# Patient Record
Sex: Male | Born: 1945 | Race: White | Hispanic: No | Smoking: Former smoker
Health system: Southern US, Community
[De-identification: ages and names within clinical notes are randomized; demographics above are authoritative.]

## PROBLEM LIST (undated history)

## (undated) DIAGNOSIS — K5792 Diverticulitis of intestine, part unspecified, without perforation or abscess without bleeding: Secondary | ICD-10-CM

## (undated) DIAGNOSIS — E559 Vitamin D deficiency, unspecified: Secondary | ICD-10-CM

## (undated) DIAGNOSIS — I4891 Unspecified atrial fibrillation: Secondary | ICD-10-CM

## (undated) DIAGNOSIS — I10 Essential (primary) hypertension: Secondary | ICD-10-CM

## (undated) DIAGNOSIS — I519 Heart disease, unspecified: Secondary | ICD-10-CM

## (undated) DIAGNOSIS — I350 Nonrheumatic aortic (valve) stenosis: Secondary | ICD-10-CM

## (undated) DIAGNOSIS — I480 Paroxysmal atrial fibrillation: Secondary | ICD-10-CM

## (undated) DIAGNOSIS — I5022 Chronic systolic (congestive) heart failure: Secondary | ICD-10-CM

## (undated) DIAGNOSIS — R945 Abnormal results of liver function studies: Secondary | ICD-10-CM

## (undated) DIAGNOSIS — E291 Testicular hypofunction: Secondary | ICD-10-CM

## (undated) DIAGNOSIS — F4320 Adjustment disorder, unspecified: Secondary | ICD-10-CM

## (undated) DIAGNOSIS — Z95810 Presence of automatic (implantable) cardiac defibrillator: Secondary | ICD-10-CM

## (undated) DIAGNOSIS — R7989 Other specified abnormal findings of blood chemistry: Secondary | ICD-10-CM

## (undated) DIAGNOSIS — I472 Ventricular tachycardia, unspecified: Secondary | ICD-10-CM

## (undated) DIAGNOSIS — I509 Heart failure, unspecified: Secondary | ICD-10-CM

## (undated) DIAGNOSIS — E871 Hypo-osmolality and hyponatremia: Secondary | ICD-10-CM

## (undated) DIAGNOSIS — I340 Nonrheumatic mitral (valve) insufficiency: Secondary | ICD-10-CM

## (undated) DIAGNOSIS — E119 Type 2 diabetes mellitus without complications: Secondary | ICD-10-CM

## (undated) DIAGNOSIS — K635 Polyp of colon: Secondary | ICD-10-CM

## (undated) DIAGNOSIS — I071 Rheumatic tricuspid insufficiency: Secondary | ICD-10-CM

## (undated) DIAGNOSIS — G4730 Sleep apnea, unspecified: Secondary | ICD-10-CM

## (undated) DIAGNOSIS — R32 Unspecified urinary incontinence: Secondary | ICD-10-CM

## (undated) HISTORY — DX: Paroxysmal atrial fibrillation: I48.0

## (undated) HISTORY — DX: Sleep apnea, unspecified: G47.30

## (undated) HISTORY — DX: Nonrheumatic mitral (valve) insufficiency: I34.0

## (undated) HISTORY — DX: Abnormal results of liver function studies: R94.5

## (undated) HISTORY — DX: Diverticulitis of intestine, part unspecified, without perforation or abscess without bleeding: K57.92

## (undated) HISTORY — DX: Chronic systolic (congestive) heart failure: I50.22

## (undated) HISTORY — DX: Presence of automatic (implantable) cardiac defibrillator: Z95.810

## (undated) HISTORY — DX: Other specified abnormal findings of blood chemistry: R79.89

## (undated) HISTORY — DX: Essential (primary) hypertension: I10

## (undated) HISTORY — DX: Ventricular tachycardia, unspecified: I47.20

## (undated) HISTORY — DX: Rheumatic tricuspid insufficiency: I07.1

## (undated) HISTORY — DX: Nonrheumatic aortic (valve) stenosis: I35.0

## (undated) HISTORY — DX: Polyp of colon: K63.5

## (undated) HISTORY — DX: Hypo-osmolality and hyponatremia: E87.1

## (undated) HISTORY — DX: Testicular hypofunction: E29.1

## (undated) HISTORY — DX: Vitamin D deficiency, unspecified: E55.9

## (undated) HISTORY — DX: Unspecified atrial fibrillation: I48.91

## (undated) HISTORY — DX: Type 2 diabetes mellitus without complications: E11.9

## (undated) HISTORY — DX: Adjustment disorder, unspecified: F43.20

## (undated) HISTORY — DX: Heart failure, unspecified: I50.9

## (undated) HISTORY — PX: CHOLECYSTECTOMY: SHX55

## (undated) HISTORY — DX: Unspecified urinary incontinence: R32

## (undated) HISTORY — DX: Heart disease, unspecified: I51.9

## (undated) HISTORY — DX: Ventricular tachycardia: I47.2

## (undated) HISTORY — PX: TONSILLECTOMY: SUR1361

---

## 1997-12-19 ENCOUNTER — Emergency Department (HOSPITAL_COMMUNITY): Admission: EM | Admit: 1997-12-19 | Discharge: 1997-12-19 | Payer: Self-pay

## 2000-06-28 ENCOUNTER — Encounter: Payer: Self-pay | Admitting: Family Medicine

## 2000-06-28 ENCOUNTER — Encounter: Admission: RE | Admit: 2000-06-28 | Discharge: 2000-06-28 | Payer: Self-pay | Admitting: Family Medicine

## 2020-03-30 ENCOUNTER — Other Ambulatory Visit: Payer: Self-pay

## 2020-03-30 ENCOUNTER — Ambulatory Visit (INDEPENDENT_AMBULATORY_CARE_PROVIDER_SITE_OTHER): Payer: Medicare Other | Admitting: Adult Health

## 2020-03-30 ENCOUNTER — Encounter: Payer: Self-pay | Admitting: Adult Health

## 2020-03-30 VITALS — BP 100/72 | HR 72 | Temp 98.1°F | Ht 68.5 in | Wt 239.4 lb

## 2020-03-30 DIAGNOSIS — E1169 Type 2 diabetes mellitus with other specified complication: Secondary | ICD-10-CM

## 2020-03-30 DIAGNOSIS — Z7689 Persons encountering health services in other specified circumstances: Secondary | ICD-10-CM

## 2020-03-30 DIAGNOSIS — I10 Essential (primary) hypertension: Secondary | ICD-10-CM | POA: Diagnosis not present

## 2020-03-30 DIAGNOSIS — G4730 Sleep apnea, unspecified: Secondary | ICD-10-CM | POA: Insufficient documentation

## 2020-03-30 DIAGNOSIS — E119 Type 2 diabetes mellitus without complications: Secondary | ICD-10-CM

## 2020-03-30 DIAGNOSIS — I4891 Unspecified atrial fibrillation: Secondary | ICD-10-CM

## 2020-03-30 DIAGNOSIS — G4733 Obstructive sleep apnea (adult) (pediatric): Secondary | ICD-10-CM | POA: Diagnosis not present

## 2020-03-30 DIAGNOSIS — I509 Heart failure, unspecified: Secondary | ICD-10-CM

## 2020-03-30 DIAGNOSIS — F4320 Adjustment disorder, unspecified: Secondary | ICD-10-CM | POA: Insufficient documentation

## 2020-03-30 DIAGNOSIS — K5792 Diverticulitis of intestine, part unspecified, without perforation or abscess without bleeding: Secondary | ICD-10-CM | POA: Insufficient documentation

## 2020-03-30 DIAGNOSIS — F4321 Adjustment disorder with depressed mood: Secondary | ICD-10-CM

## 2020-03-30 LAB — CBC WITH DIFFERENTIAL/PLATELET
Eosinophils Absolute: 234 cells/uL (ref 15–500)
HCT: 44.3 % (ref 38.5–50.0)
MCHC: 33.9 g/dL (ref 32.0–36.0)
MPV: 9.3 fL (ref 7.5–12.5)
Neutro Abs: 6624 cells/uL (ref 1500–7800)
Platelets: 323 10*3/uL (ref 140–400)
Total Lymphocyte: 14.4 %
WBC: 9 10*3/uL (ref 3.8–10.8)

## 2020-03-30 NOTE — Progress Notes (Signed)
Patient presents to clinic today to establish care. He is a pleasant 74 year old male who  has a past medical history of Adjustment disorder, Atrial fibrillation ([place]), Benign colon polyp, CHF (congestive heart failure) ([place]), Diabetes mellitus without complication ([place]), Diverticulitis, Essential hypertension, Heart disease, Hypogonadism male, Sleep apnea, Urine incontinence, and Vitamin D deficiency.  He recently moved back to [place] from [place] where he lived for a number of years   Acute Concerns: Establish Care  Chronic Issues: DM -is currently managed with Metformin 500 mg twice daily and Glaxambi 25 mg daily.  He reports that he had an A1c done approximately 2 weeks ago and this result was 6.6.  Monitor his blood sugars twice a day with readings between 120 and 180.  He denies episodes of hypoglycemia.  He was seen by endocrinology in [facility]  OSA - uses Bipap nightly.  He feels as though this works well for him.  Denies fatigue.  He was seen by pulmonary in [place] and would like to be established with them in [place]. He also uses Symbicort for an unknown breathing issue.  He does feel as though since starting Symbicort that his breathing has improved.  Atrial Fibrillation -seen By cardiology in [place], [place].  Cardiac stress test and cath in 2001.  He is currently prescribed Eliquis 5 mg twice daily, Dofetilde 125 mcg and Coreg 6.25 mg for rate control. Does report that he was seen prior to moving and had an echocardiogram done which showed "my heart is about 20%".  I do not have these notes.  Last echo in care everywhere appears to be from 2017 which showed mild mitral regurg, mild thickening of the aortic valve, mild to moderate aortic stenosis with an EF of 30 to 35%. He would like to follow up with Cardiology in [facility]   CHF -currently prescribed spironolactone 25 mg daily, furosemide 80 mg twice daily.  He is also on a potassium supplement of 20 mEq twice a  day.  CAD/Hyperlipidemia -takes Lipitor 20 mg daily. Last lipid panel  Adjustment disorder.- Reports well-controlled on Lexapro 10 mg daily.   Health Maintenance: Dental -- Routine Care Vision -- Routine Care Immunizations -- UTD Colonoscopy -- 2019 - for GI bleed. History of Polyps. Due in 2022 Diet: Tries to eat healthy  Exercise: Ties to exercise multiple times a week.    Past Medical History:  Diagnosis Date   Adjustment disorder    Atrial fibrillation (HCC)    Benign colon polyp    CHF (congestive heart failure) ([place])    Diabetes mellitus without complication ([place])    Diverticulitis    Essential hypertension    Heart disease    Hypogonadism male    Sleep apnea    Urine incontinence    Vitamin D deficiency     Past Surgical History:  Procedure Laterality Date   CHOLECYSTECTOMY     TONSILLECTOMY      Current Outpatient Medications on File Prior to Visit  Medication Sig Dispense Refill   Arginine (L-ARGININE-500) 500 MG CAPS Take by mouth.     Ascorbic Acid (VITAMIN C) 1000 MG tablet Take 1,000 mg by mouth daily.     atorvastatin (LIPITOR) 20 MG tablet Take 20 mg by mouth daily.     B Complex Vitamins (B COMPLEX PO) Take by mouth daily.     BIOTIN PO Take 10,000 Units by mouth daily.     budesonide-formoterol (SYMBICORT) 160-4.5 MCG/ACT inhaler INL 2 PFS PO  BID IN THE MORNING AND IN THE EVE     CALCIUM PO Take by mouth daily.     carvedilol (COREG) 6.25 MG tablet Take 6.25 mg by mouth 2 (two) times daily.     cholecalciferol (VITAMIN D3) 25 MCG (1000 UNIT) tablet Take 1,000 Units by mouth daily.     Coenzyme Q10 (COQ10) 100 MG CAPS Take by mouth daily.     dofetilide (TIKOSYN) 125 MCG capsule Take 125 mcg by mouth every 12 (twelve) hours.     ELIQUIS 5 MG TABS tablet Take 5 mg by mouth 2 (two) times daily.     escitalopram (LEXAPRO) 10 MG tablet Take 10 mg by mouth daily.     furosemide (LASIX) 40 MG tablet Take 80 mg by mouth 2  (two) times daily.     MAGNESIUM PO Take 400 mg by mouth daily.     metFORMIN (GLUCOPHAGE) 500 MG tablet Take by mouth.     Multiple Vitamins-Minerals (ZINC PO) Take by mouth daily.     Omega-3 Fatty Acids (FISH OIL PO) Take 1,000 mg by mouth.     OVER THE COUNTER MEDICATION Chorium     OVER THE COUNTER MEDICATION L-caritine 56m     Potassium Chloride ER 20 MEQ TBCR Take 2 tablets by mouth daily.     spironolactone (ALDACTONE) 25 MG tablet Take 25 mg by mouth daily.     vitamin E (VITAMIN E) 180 MG (400 UNITS) capsule Take 400 Units by mouth daily.     Zn-Pyg Afri-Nettle-Saw Palmet (SAW PALMETTO COMPLEX PO) Take 540 mg by mouth daily.     No current facility-administered medications on file prior to visit.    No Known Allergies  Family History  Problem Relation Age of Onset   Heart attack Mother    High Cholesterol Mother    Asthma Father    Kidney disease Sister    Stroke Maternal Grandfather    Heart attack Paternal Grandfather     Social History   Socioeconomic History   Marital status: Widowed    Spouse name: Not on file   Number of children: Not on file   Years of education: Not on file   Highest education level: Not on file  Occupational History   Not on file  Tobacco Use   Smoking status: Former Smoker   Smokeless tobacco: Never Used  Substance and Sexual Activity   Alcohol use: Not Currently   Drug use: Not Currently   Sexual activity: Not on file  Other Topics Concern   Not on file  Social History Narrative   Not on file   Social Determinants of Health   Financial Resource Strain:    Difficulty of Paying Living Expenses: Not on file  Food Insecurity:    Worried About RCorydonin the Last Year: Not on file   Ran Out of Food in the Last Year: Not on file  Transportation Needs:    Lack of Transportation (Medical): Not on file   Lack of Transportation (Non-Medical): Not on file  Physical Activity:    Days  of Exercise per Week: Not on file   Minutes of Exercise per Session: Not on file  Stress:    Feeling of Stress : Not on file  Social Connections:    Frequency of Communication with Friends and Family: Not on file   Frequency of Social Gatherings with Friends and Family: Not on file   Attends Religious Services: Not on file  Active Member of Clubs or Organizations: Not on file   Attends Archivist Meetings: Not on file   Marital Status: Not on file  Intimate Partner Violence:    Fear of Current or Ex-Partner: Not on file   Emotionally Abused: Not on file   Physically Abused: Not on file   Sexually Abused: Not on file    Review of Systems  Constitutional: Negative.   HENT: Positive for hearing loss and tinnitus.   Eyes: Negative.   Respiratory: Positive for shortness of breath.   Cardiovascular: Negative.   Gastrointestinal: Negative.   Genitourinary: Negative.   Musculoskeletal: Negative.   Neurological: Negative.   Endo/Heme/Allergies: Negative.   Psychiatric/Behavioral: Negative.   All other systems reviewed and are negative.   BP 100/72 (BP Location: Left Arm, Patient Position: Sitting, Cuff Size: Large)    Pulse 72    Temp 98.1 F (36.7 C) (Oral)    Ht 5' 8.5" (1.74 m)    Wt 239 lb 6.4 oz (108.6 kg)    SpO2 94%    BMI 35.87 kg/m   Physical Exam Vitals and nursing note reviewed.  Constitutional:      Appearance: Normal appearance. He is obese.  HENT:     Ears:     Comments: Extremely hard of hearing   Cardiovascular:     Rate and Rhythm: Normal rate. Rhythm irregularly irregular.     Pulses: Normal pulses.     Heart sounds: Normal heart sounds.  Pulmonary:     Effort: Pulmonary effort is normal.     Breath sounds: Normal breath sounds.  Musculoskeletal:        General: Normal range of motion.  Skin:    General: Skin is warm and dry.     Capillary Refill: Capillary refill takes less than 2 seconds.  Neurological:     General: No focal  deficit present.     Mental Status: He is alert and oriented to person, place, and time.  Psychiatric:        Mood and Affect: Mood normal.        Behavior: Behavior normal.        Thought Content: Thought content normal.        Judgment: Judgment normal.      Assessment/Plan: 1. Encounter to establish care - Will request notes and follow up with patient about CPE  - Follow up as needed - Encouraged weight loss through diet and exercise   2. Type 2 diabetes mellitus with other specified complication, without long-term current use of insulin (HCC) -Continue with current medications.  We will recheck A1c today.  Advised to follow-up in 3 months - CBC with Differential/Platelet; Future - CMP with eGFR([facility]); Future - Hemoglobin A1c; Future - Hemoglobin A1c - CMP with eGFR([facility]) - CBC with Differential/Platelet  3. Obstructive sleep apnea syndrome  - Ambulatory referral to Pulmonology  4. Essential hypertension -No change in blood pressure medications at this time   5. Chronic congestive heart failure, unspecified heart failure type ([place]) Continue with current medication therapy.  Will refer to cardiology. - Ambulatory referral to Cardiology  6. Adjustment disorder with depressed mood - Continue with Lexapr   7. Atrial fibrillation, unspecified type [facility])  - Ambulatory referral to Cardiology  Dorothyann Peng, NP

## 2020-03-30 NOTE — Patient Instructions (Addendum)
It was great seeing you today   I am going to refer you to Cardiology and Pulmonary, they will call you to schedule your exams   Please follow up with me in 3 months for diabetic check

## 2020-03-31 LAB — COMPLETE METABOLIC PANEL WITH GFR
AG Ratio: 1.5 (calc) (ref 1.0–2.5)
ALT: 23 U/L (ref 9–46)
AST: 26 U/L (ref 10–35)
Albumin: 4.7 g/dL (ref 3.6–5.1)
Alkaline phosphatase (APISO): 54 U/L (ref 35–144)
BUN: 21 mg/dL (ref 7–25)
CO2: 29 mmol/L (ref 20–32)
Calcium: 9.7 mg/dL (ref 8.6–10.3)
Chloride: 98 mmol/L (ref 98–110)
Creat: 1.08 mg/dL (ref 0.70–1.18)
GFR, Est African American: 78 mL/min/{1.73_m2} (ref >=60)
GFR, Est Non African American: 67 mL/min/{1.73_m2} (ref >=60)
Globulin: 3.2 g/dL (calc) (ref 1.9–3.7)
Glucose, Bld: 145 mg/dL — ABNORMAL HIGH (ref 65–99)
Potassium: 3.9 mmol/L (ref 3.5–5.3)
Sodium: 139 mmol/L (ref 135–146)
Total Bilirubin: 1.4 mg/dL — ABNORMAL HIGH (ref 0.2–1.2)
Total Protein: 7.9 g/dL (ref 6.1–8.1)

## 2020-03-31 LAB — CBC WITH DIFFERENTIAL/PLATELET
Absolute Monocytes: 792 cells/uL (ref 200–950)
Basophils Absolute: 54 cells/uL (ref 0–200)
Basophils Relative: 0.6 %
Eosinophils Relative: 2.6 %
Hemoglobin: 15 g/dL (ref 13.2–17.1)
Lymphs Abs: 1296 cells/uL (ref 850–3900)
MCH: 31 pg (ref 27.0–33.0)
MCV: 91.5 fL (ref 80.0–100.0)
Monocytes Relative: 8.8 %
Neutrophils Relative %: 73.6 %
RBC: 4.84 10*6/uL (ref 4.20–5.80)
RDW: 14.7 % (ref 11.0–15.0)

## 2020-03-31 LAB — HEMOGLOBIN A1C
Hgb A1c MFr Bld: 6.3 % of total Hgb — ABNORMAL HIGH (ref <5.7)
Mean Plasma Glucose: 134 (calc)
eAG (mmol/L): 7.4 (calc)

## 2020-04-01 ENCOUNTER — Encounter: Payer: Self-pay | Admitting: Internal Medicine

## 2020-04-01 ENCOUNTER — Ambulatory Visit (INDEPENDENT_AMBULATORY_CARE_PROVIDER_SITE_OTHER): Payer: Medicare Other | Admitting: Internal Medicine

## 2020-04-01 ENCOUNTER — Other Ambulatory Visit: Payer: Self-pay

## 2020-04-01 VITALS — BP 108/62 | HR 70 | Ht 68.5 in | Wt 240.4 lb

## 2020-04-01 DIAGNOSIS — I5020 Unspecified systolic (congestive) heart failure: Secondary | ICD-10-CM | POA: Diagnosis not present

## 2020-04-01 DIAGNOSIS — E119 Type 2 diabetes mellitus without complications: Secondary | ICD-10-CM

## 2020-04-01 DIAGNOSIS — I350 Nonrheumatic aortic (valve) stenosis: Secondary | ICD-10-CM

## 2020-04-01 DIAGNOSIS — I340 Nonrheumatic mitral (valve) insufficiency: Secondary | ICD-10-CM

## 2020-04-01 DIAGNOSIS — I480 Paroxysmal atrial fibrillation: Secondary | ICD-10-CM | POA: Insufficient documentation

## 2020-04-01 DIAGNOSIS — I10 Essential (primary) hypertension: Secondary | ICD-10-CM

## 2020-04-01 DIAGNOSIS — I4891 Unspecified atrial fibrillation: Secondary | ICD-10-CM

## 2020-04-01 NOTE — Patient Instructions (Signed)
Medication Instructions:  Your physician recommends that you continue on your current medications as directed. Please refer to the Current Medication list given to you today.  *If you need a refill on your cardiac medications before your next appointment, please call your pharmacy*   Lab Work: Lab work to be done today--BMP, BNP, Magnesium If you have labs (blood work) drawn today and your tests are completely normal, you will receive your results only by: Marland Kitchen MyChart Message (if you have MyChart) OR . A paper copy in the mail If you have any lab test that is abnormal or we need to change your treatment, we will call you to review the results.   Testing/Procedures: Your physician has requested that you have an echocardiogram. Echocardiography is a painless test that uses sound waves to create images of your heart. It provides your doctor with information about the size and shape of your heart and how well your heart's chambers and valves are working. This procedure takes approximately one hour. There are no restrictions for this procedure.  You have been referred to Electrophysiologist in our office    Follow-Up: At [facility], you and your health needs are our priority.  As part of our continuing mission to provide you with exceptional heart care, we have created designated Provider Care Teams.  These Care Teams include your primary Cardiologist (physician) and Advanced Practice Providers (APPs -  Physician Assistants and Nurse Practitioners) who all work together to provide you with the care you need, when you need it.  We recommend signing up for the patient portal called "MyChart".  Sign up information is provided on this After Visit Summary.  MyChart is used to connect with patients for Virtual Visits (Telemedicine).  Patients are able to view lab/test results, encounter notes, upcoming appointments, etc.  Non-urgent messages can be sent to your provider as well.   To learn more about  what you can do with MyChart, go to [email or web address].    Your next appointment:   2 month(s)  The format for your next appointment:   In Person  Provider:   You may see Christell Constant, MD or one of the following Advanced Practice Providers on your designated Care Team:    Ronie Spies, PA-C  Jacolyn Reedy, PA-C    Other Instructions

## 2020-04-01 NOTE — Progress Notes (Signed)
Cardiology Office Note:    Date:  04/01/2020   ID:  Chad Wilkerson, DOB 08-Sep-1945, MRN [id number]  PCP:  [name], NP  [facility] Cardiologist:  No primary care provider on file.  [facility] Electrophysiologist:  None   Referring MD: [name], NP   CC: Heart failure and atrial fibrillation   History of Present Illness:    Chad Wilkerson is a 74 y.o. male with a hx of Diabetes with HTN, HFrEF 25-30% with Medtronic ICD, mild to moderate AS, Mild MR, Atrial fibrillation on Dofetilide, eliquis, and coreg;  OSA on BiPAP who presents for establishing care.  Patient moved back home to [facility].  Patient notes he has AF but is asymptomatic, no palpitations. No bleeding problems on eliquis(mild bruising only). No problems with AAD.  No fatigue; feels well.  No chest pain.  No shortness of breath.  Still has some dyspnea or exertion.  (14 stairs).   Able to lie flat without shortness of breath (though wears CPAP).  Notes some bendopnea.  Weight is down.  Past Medical History:  Diagnosis Date  . Adjustment disorder   . Atrial fibrillation (HCC)   . Benign colon polyp   . CHF (congestive heart failure) (HCC)   . Diabetes mellitus without complication (HCC)   . Diverticulitis   . Essential hypertension   . Heart disease   . Hypogonadism male   . Sleep apnea   . Urine incontinence   . Vitamin D deficiency     Past Surgical History:  Procedure Laterality Date  . CHOLECYSTECTOMY    . TONSILLECTOMY      Current Medications: Current Meds  Medication Sig  . Arginine (L-ARGININE-500) 500 MG CAPS Take by mouth.  . Ascorbic Acid (VITAMIN C) 1000 MG tablet Take 1,000 mg by mouth daily.  Marland Kitchen atorvastatin (LIPITOR) 20 MG tablet Take 20 mg by mouth daily.  . B Complex Vitamins (B COMPLEX PO) Take by mouth daily.  Marland Kitchen BIOTIN PO Take 10,000 Units by mouth daily.  . budesonide-formoterol (SYMBICORT) 160-4.5 MCG/ACT inhaler INL 2 PFS PO BID IN THE MORNING AND IN THE  EVE  . CALCIUM PO Take by mouth daily.  . carvedilol (COREG) 6.25 MG tablet Take 6.25 mg by mouth 2 (two) times daily.  . cholecalciferol (VITAMIN D3) 25 MCG (1000 UNIT) tablet Take 1,000 Units by mouth daily.  . Coenzyme Q10 (COQ10) 100 MG CAPS Take by mouth daily.  Marland Kitchen dofetilide (TIKOSYN) 125 MCG capsule Take 125 mcg by mouth every 12 (twelve) hours.  Marland Kitchen ELIQUIS 5 MG TABS tablet Take 5 mg by mouth 2 (two) times daily.  Marland Kitchen escitalopram (LEXAPRO) 10 MG tablet Take 10 mg by mouth daily.  . furosemide (LASIX) 40 MG tablet Take 80 mg by mouth 2 (two) times daily.  Marland Kitchen MAGNESIUM PO Take 400 mg by mouth daily.  . metFORMIN (GLUCOPHAGE) 500 MG tablet Take by mouth.  . Multiple Vitamins-Minerals (ZINC PO) Take by mouth daily.  . Omega-3 Fatty Acids (FISH OIL PO) Take 1,000 mg by mouth.  Marland Kitchen OVER THE COUNTER MEDICATION Chorium  . OVER THE COUNTER MEDICATION L-caritine 500mg   . Potassium Chloride ER 20 MEQ TBCR Take 2 tablets by mouth daily.  spironolactone (ALDACTONE) 25 MG tablet Take 25 mg by mouth daily.  . vitamin E (VITAMIN E) 180 MG (400 UNITS) capsule Take 400 Units by mouth daily.  Marland Kitchen Zn-Pyg Afri-Nettle-Saw Palmet (SAW PALMETTO COMPLEX PO) Take 540 mg by mouth daily.  Allergies:   Patient has no known allergies.   Social History   Socioeconomic History  . Marital status: Widowed    Spouse name: Not on file  . Number of children: Not on file  . Years of education: Not on file  . Highest education level: Not on file  Occupational History  . Not on file  Tobacco Use  . Smoking status: Former Games developer  . Smokeless tobacco: Never Used  Substance and Sexual Activity  . Alcohol use: Not Currently  . Drug use: Not Currently  . Sexual activity: Not on file  Other Topics Concern  . Not on file  Social History Narrative  . Not on file   Social Determinants of Health   Financial Resource Strain:   . Difficulty of Paying Living Expenses: Not on file  Food Insecurity:   . Worried About  Programme researcher, broadcasting/film/video in the Last Year: Not on file  . Ran Out of Food in the Last Year: Not on file  Transportation Needs:   . Lack of Transportation (Medical): Not on file  . Lack of Transportation (Non-Medical): Not on file  Physical Activity:   . Days of Exercise per Week: Not on file  . Minutes of Exercise per Session: Not on file  Stress:   . Feeling of Stress : Not on file  Social Connections:   . Frequency of Communication with Friends and Family: Not on file  . Frequency of Social Gatherings with Friends and Family: Not on file  . Attends Religious Services: Not on file  . Active Member of Clubs or Organizations: Not on file  . Attends Banker Meetings: Not on file  . Marital Status: Not on file    Family History: The patient's family history includes Asthma in his father; Heart attack in his mother and paternal grandfather; High Cholesterol in his mother; Kidney disease in his sister; Stroke in his maternal grandfather.  ROS:   Please see the history of present illness.    All other systems reviewed and are negative.  EKGs/Labs/Other Studies Reviewed:    The following studies were reviewed today:  EKG:  EKG is ordered today.  The ekg ordered today demonstrates sinus rhythm with 1st HB PR of 300 anterior infarct pattern and QTc 470  Recent Labs: 03/30/2020: ALT 23; BUN 21; Creat 1.08; Hemoglobin 15.0; Platelets 323; Potassium 3.9; Sodium 139   Risk Assessment/Calculations:     CHA2DS2-VASc Score = 3  This indicates a 3.2% annual risk of stroke. The patient's score is based upon: CHF History: 1 HTN History: 1 Diabetes History: 0 Stroke History: 0 Vascular Disease History: 0 Age Score: 1 Gender Score: 0     Physical Exam:    VS:  BP 108/62   Pulse 70   Ht 5' 8.5" (1.74 m)   Wt 240 lb 6.4 oz (109 kg)   SpO2 93%   BMI 36.02 kg/m     Wt Readings from Last 3 Encounters:  04/01/20 240 lb 6.4 oz (109 kg)  03/30/20 239 lb 6.4 oz (108.6 kg)      GEN: Obese well developed in no acute distress HEENT: Normal NECK: No JVD thought difficult exam; No carotid bruits LYMPHATICS: No lymphadenopathy CARDIAC: RRR, no murmurs, rubs, gallops RESPIRATORY:  Clear to auscultation without rales, wheezing or rhonchi  ABDOMEN: Soft, non-tender , non-distended btu with dullness to percussion; no fluid wave MUSCULOSKELETAL:  +1 edema bilaterally; No deformity  SKIN: Warm and dry NEUROLOGIC:  Alert and oriented x 3 PSYCHIATRIC:  Normal affect   ASSESSMENT:    1. Heart failure with reduced ejection fraction (HCC)   2. Atrial fibrillation, unspecified type (HCC)   3. Diabetes mellitus with coincident hypertension (HCC)   4. Moderate aortic stenosis   5. Mild mitral regurgitation    PLAN:    In order of problems listed above:  Chronic Heart Failure reduced EF ~ 30% Moderate AS Mild MR Diabetes with hypertension- A1c 6.3 Morbid Obesity - NYHA class II, StageB , euvolemic, etiology from A fib - Would continue lasix 80 mg BID  - Ddaily weights, and fluid restriction of < 2 L  - Will check BMP, BNP, Mg. - Continue Coreg 6.25 mg BID  - No ARNI/ARB/ACEi in the setting of relatively low BP - Continue spironolactone 25 mg   - Will obtain Echo  - Consider future SGLT2i at next visit - will reach out to nutrition consult - Has Medtronic ICD; could need CRT given PR interval of 300 - Normal LHC in [place].    Paroxysmal Atrial Fibrillation - EKG/tele consistent with new-onset atrial fibrillation. Risk factors include OSA on BiPAP. CHADSVASC=3. - Continue Coreg & Tikosyn - and Eliquis - referral to EP  Will see in 2-3 months for optimization and likely SGLT2i start unless new symptoms or abnormal test results warranting change in plan  Would be reasonable for APP Follow up  Medication Adjustments/Labs and Tests Ordered: Current medicines are reviewed at length with the patient today.  Concerns regarding medicines are outlined above.   No orders of the defined types were placed in this encounter.  No orders of the defined types were placed in this encounter.   There are no Patient Instructions on file for this visit.   Signed, Christell Constant, MD  04/01/2020 2:05 PM    [facility]

## 2020-04-02 ENCOUNTER — Telehealth: Payer: Self-pay | Admitting: *Deleted

## 2020-04-02 DIAGNOSIS — R600 Localized edema: Secondary | ICD-10-CM

## 2020-04-02 DIAGNOSIS — I5020 Unspecified systolic (congestive) heart failure: Secondary | ICD-10-CM

## 2020-04-02 LAB — BASIC METABOLIC PANEL
BUN/Creatinine Ratio: 22 (ref 10–24)
BUN: 20 mg/dL (ref 8–27)
CO2: 24 mmol/L (ref 20–29)
Calcium: 9.3 mg/dL (ref 8.6–10.2)
Chloride: 100 mmol/L (ref 96–106)
Creatinine, Ser: 0.93 mg/dL (ref 0.76–1.27)
GFR calc Af Amer: 93 mL/min/{1.73_m2} (ref >59)
GFR calc non Af Amer: 81 mL/min/{1.73_m2} (ref >59)
Glucose: 116 mg/dL — ABNORMAL HIGH (ref 65–99)
Potassium: 4.2 mmol/L (ref 3.5–5.2)
Sodium: 140 mmol/L (ref 134–144)

## 2020-04-02 LAB — PRO B NATRIURETIC PEPTIDE: NT-Pro BNP: 5001 pg/mL — ABNORMAL HIGH (ref 0–376)

## 2020-04-02 LAB — MAGNESIUM: Magnesium: 2.5 mg/dL — ABNORMAL HIGH (ref 1.6–2.3)

## 2020-04-02 MED ORDER — TORSEMIDE 20 MG PO TABS: 60.0000 mg | ORAL_TABLET | Freq: Two times a day (BID) | ORAL | 1 refills | Status: DC

## 2020-04-02 NOTE — Telephone Encounter (Signed)
Reviewed with Dr Izora Ribas and patient should continue Magnesium.  Will need BMP and Magnesium checked in 7-10 days. I placed call to patient and left message to call office.

## 2020-04-02 NOTE — Telephone Encounter (Signed)
-----   Message from Christell Constant, MD sent at 04/02/2020 10:41 AM EDT ----- Results: Elevated BNP normal kidney with persistent LE edema Change to torsemide 60 mg BID  Christell Constant, MD

## 2020-04-02 NOTE — Telephone Encounter (Signed)
I spoke with patient and reviewed lab results and recommendations from Dr Izora Ribas with him.  Appointment with Dr [name] moved from 10/26 to 11/2 at 2:30 so follow up lab work could be done the same day. Will send prescription to [facility] on [address] and [address]  Patient to start torsemide tomorrow. He is aware to stop furosemide

## 2020-04-06 ENCOUNTER — Encounter: Payer: Medicare Other | Admitting: Cardiology

## 2020-04-13 ENCOUNTER — Other Ambulatory Visit: Payer: Medicare Other | Admitting: *Deleted

## 2020-04-13 ENCOUNTER — Ambulatory Visit (INDEPENDENT_AMBULATORY_CARE_PROVIDER_SITE_OTHER): Payer: Medicare Other | Admitting: Cardiology

## 2020-04-13 ENCOUNTER — Telehealth: Payer: Self-pay

## 2020-04-13 ENCOUNTER — Encounter: Payer: Self-pay | Admitting: Cardiology

## 2020-04-13 ENCOUNTER — Other Ambulatory Visit: Payer: Self-pay

## 2020-04-13 VITALS — BP 98/64 | HR 81 | Ht 68.5 in | Wt 238.0 lb

## 2020-04-13 DIAGNOSIS — R5381 Other malaise: Secondary | ICD-10-CM | POA: Diagnosis not present

## 2020-04-13 DIAGNOSIS — I440 Atrioventricular block, first degree: Secondary | ICD-10-CM

## 2020-04-13 DIAGNOSIS — R600 Localized edema: Secondary | ICD-10-CM

## 2020-04-13 DIAGNOSIS — I5022 Chronic systolic (congestive) heart failure: Secondary | ICD-10-CM

## 2020-04-13 DIAGNOSIS — I5020 Unspecified systolic (congestive) heart failure: Secondary | ICD-10-CM

## 2020-04-13 NOTE — Progress Notes (Addendum)
Electrophysiology Office Note:    Date:  04/13/2020   ID:  Chad Wilkerson, DOB Nov 06, 1945, MRN [id number]  PCP:  [name], NP  [facility] Cardiologist:  Christell Constant, MD  [facility] Electrophysiologist:  None   Referring MD: [name] A*   Chief Complaint: Chronic systolic heart failure  History of Present Illness:    Chad Wilkerson is a 74 y.o. male who presents for an evaluation of chronic systolic heart failure with ICD in situ at the request of Dr. Izora Ribas. Their medical history includes atrial fibrillation, diabetes, hypertension, sleep apnea. Patient has recently moved to [facility] and recently establish care with Dr. Izora Ribas.   For his atrial fibrillation, he is maintained on dofetilide, Eliquis and carvedilol.  Tikosyn was started the summer.  He is on BiPAP for his sleep apnea.  He tells me he is doing quite well.  He is worked on losing weight and is down to 238 pounds from 280.  He is interested in becoming more active.  Past Medical History:  Diagnosis Date  . Adjustment disorder   . Atrial fibrillation (HCC)   . Benign colon polyp   . CHF (congestive heart failure) (HCC)   . Diabetes mellitus without complication (HCC)   . Diverticulitis   . Essential hypertension   . Heart disease   . Hypogonadism male   . Sleep apnea   . Urine incontinence   . Vitamin D deficiency     Past Surgical History:  Procedure Laterality Date  . CHOLECYSTECTOMY    . TONSILLECTOMY      Current Medications: Current Meds  Medication Sig  . Arginine (L-ARGININE-500) 500 MG CAPS Take by mouth.  . Ascorbic Acid (VITAMIN C) 1000 MG tablet Take 1,000 mg by mouth daily.  Marland Kitchen atorvastatin (LIPITOR) 20 MG tablet Take 20 mg by mouth daily.  . B Complex Vitamins (B COMPLEX PO) Take by mouth daily.  Marland Kitchen BIOTIN PO Take 10,000 Units by mouth daily.  . budesonide-formoterol (SYMBICORT) 160-4.5 MCG/ACT inhaler INL 2 PFS PO BID IN THE MORNING  AND IN THE EVE  . CALCIUM PO Take by mouth daily.  . carvedilol (COREG) 6.25 MG tablet Take 6.25 mg by mouth 2 (two) times daily.  . cholecalciferol (VITAMIN D3) 25 MCG (1000 UNIT) tablet Take 1,000 Units by mouth daily.  . Coenzyme Q10 (COQ10) 100 MG CAPS Take by mouth daily.  Marland Kitchen dofetilide (TIKOSYN) 125 MCG capsule Take 125 mcg by mouth every 12 (twelve) hours.  Marland Kitchen ELIQUIS 5 MG TABS tablet Take 5 mg by mouth 2 (two) times daily.  Marland Kitchen escitalopram (LEXAPRO) 10 MG tablet Take 10 mg by mouth daily.  Marland Kitchen MAGNESIUM PO Take 400 mg by mouth daily.  . metFORMIN (GLUCOPHAGE) 500 MG tablet Take by mouth.  . Multiple Vitamins-Minerals (ZINC PO) Take by mouth daily.  . Omega-3 Fatty Acids (FISH OIL PO) Take 1,000 mg by mouth.  Marland Kitchen OVER THE COUNTER MEDICATION Chorium  . OVER THE COUNTER MEDICATION L-caritine 500mg   . Potassium Chloride ER 20 MEQ TBCR Take 2 tablets by mouth daily.  spironolactone (ALDACTONE) 25 MG tablet Take 25 mg by mouth daily.  Marland Kitchen torsemide (DEMADEX) 20 MG tablet Take 3 tablets (60 mg total) by mouth 2 (two) times daily.  . vitamin E (VITAMIN E) 180 MG (400 UNITS) capsule Take 400 Units by mouth daily.  Marland Kitchen Zn-Pyg Afri-Nettle-Saw Palmet (SAW PALMETTO COMPLEX PO) Take 540 mg by mouth daily.     Allergies:  Patient has no known allergies.   Social History   Socioeconomic History  . Marital status: Widowed    Spouse name: Not on file  . Number of children: Not on file  . Years of education: Not on file  . Highest education level: Not on file  Occupational History  . Not on file  Tobacco Use  . Smoking status: Former Games developer  . Smokeless tobacco: Never Used  Substance and Sexual Activity  . Alcohol use: Not Currently  . Drug use: Not Currently  . Sexual activity: Not on file  Other Topics Concern  . Not on file  Social History Narrative  . Not on file   Social Determinants of Health   Financial Resource Strain:   . Difficulty of Paying Living Expenses: Not on file  Food  Insecurity:   . Worried About Programme researcher, broadcasting/film/video in the Last Year: Not on file  . Ran Out of Food in the Last Year: Not on file  Transportation Needs:   . Lack of Transportation (Medical): Not on file  . Lack of Transportation (Non-Medical): Not on file  Physical Activity:   . Days of Exercise per Week: Not on file  . Minutes of Exercise per Session: Not on file  Stress:   . Feeling of Stress : Not on file  Social Connections:   . Frequency of Communication with Friends and Family: Not on file  . Frequency of Social Gatherings with Friends and Family: Not on file  . Attends Religious Services: Not on file  . Active Member of Clubs or Organizations: Not on file  . Attends Banker Meetings: Not on file  . Marital Status: Not on file     Family History: The patient's family history includes Asthma in his father; Heart attack in his mother and paternal grandfather; High Cholesterol in his mother; Kidney disease in his sister; Stroke in his maternal grandfather.  ROS:   Please see the history of present illness.    All other systems reviewed and are negative.  EKGs/Labs/Other Studies Reviewed:    The following studies were reviewed today: Prior records, device interrogation  April 01, 2020 EKG personally reviewed shows sinus rhythm with a PR interval of 300 ms. Anterior infarction pattern.  QTC 473 ms.  QRS duration 120 ms on recent EKG.   November 2 device interrogation personally reviewed Longevity 8.3 years Presenting rhythm ventricular sensed at 80bpm Chad Wilkerson 11.3% atrial fibrillation burden predominantly prior to April 2021 very little atrial fibrillation noted since that time, 1 episode at the end of August 2021 RV threshold 0.75 is 0.4, sensitivity 19.1 mV, 418 ohms 0 VT episodes   Recent Labs: 03/30/2020: ALT 23; Hemoglobin 15.0; Platelets 323 04/01/2020: BUN 20; Creatinine, Ser 0.93; Magnesium 2.5; NT-Pro BNP 5,001; Potassium 4.2; Sodium 140  Recent  Lipid Panel No results found for: CHOL, TRIG, HDL, CHOLHDL, VLDL, LDLCALC, LDLDIRECT  Physical Exam:    VS:  BP 98/64   Pulse 81   Ht 5' 8.5" (1.74 m)   Wt 238 lb (108 kg)   SpO2 98%   BMI 35.66 kg/m     Wt Readings from Last 3 Encounters:  04/13/20 238 lb (108 kg)  04/01/20 240 lb 6.4 oz (109 kg)  03/30/20 239 lb 6.4 oz (108.6 kg)     GEN:  Well nourished, well developed in no acute distress.  Obese HEENT: Normal NECK: No JVD; No carotid bruits LYMPHATICS: No lymphadenopathy CARDIAC: ICD pocket well-healed  on the left chest.  RRR, no murmurs, rubs, gallops RESPIRATORY:  Clear to auscultation without rales, wheezing or rhonchi  ABDOMEN: Soft, non-tender, non-distended MUSCULOSKELETAL:  No edema; No deformity  SKIN: Warm and dry NEUROLOGIC:  Alert and oriented x 3 PSYCHIATRIC:  Normal affect   ASSESSMENT:    1. Chronic systolic heart failure (HCC)   2. First degree AV block    PLAN:    In order of problems listed above:  1. Chronic systolic heart failure Left ventricular function 25% NYHA class II, euvolemic ICD in situ.  Device well functioning. At this time given narrow QRS, I do not think upgrade to CRT is warranted.  We'll continue to keep a close eye on his conduction, RV pacing burden and QRS duration to guide whether or not this would be indicated in the future. Continue carvedilol, spironolactone. SGLT2 being considered.   2. First-degree AV block PR interval 300 ms  3. Atrial fibrillation Reduced burden on device interrogation since starting Tikosyn. Continue dofetilide 125 mcg every 12 hours Continue Eliquis 5 mg twice daily QTC on recent EKG within acceptable limits.   Follow-up 3 months with blood work and ECG at that time      Medication Adjustments/Labs and Tests Ordered: Current medicines are reviewed at length with the patient today.  Concerns regarding medicines are outlined above.  No orders of the defined types were placed in this  encounter.  No orders of the defined types were placed in this encounter.    Signed, Steffanie Dunn, MD, [facility]  04/13/2020 2:39 PM    Electrophysiology [facility]

## 2020-04-13 NOTE — Patient Instructions (Addendum)
Medication Instructions:  Your physician recommends that you continue on your current medications as directed. Please refer to the Current Medication list given to you today.  Labwork: You will get lab work as previously ordered.  BMP and magnesium  Testing/Procedures: None ordered.  Follow-Up: Your physician wants you to follow-up in: 3 months with Dr. [name].   You will receive a reminder letter in the mail two months in advance. If you don't receive a letter, please call our office to schedule the follow-up appointment.  Remote monitoring is used to monitor your ICD from home.   Device clinic [phone]  Any Other Special Instructions Will Be Listed Below (If Applicable).  If you need a refill on your cardiac medications before your next appointment, please call your pharmacy.

## 2020-04-13 NOTE — Telephone Encounter (Signed)
Patient transferring to our clinic from [facility].  Request submitted in Carelink for release.  Needs phone call to [facility] clinic during office hours to request release [phone].

## 2020-04-14 LAB — BASIC METABOLIC PANEL
BUN/Creatinine Ratio: 23 (ref 10–24)
BUN: 24 mg/dL (ref 8–27)
CO2: 28 mmol/L (ref 20–29)
Calcium: 9.7 mg/dL (ref 8.6–10.2)
Chloride: 92 mmol/L — ABNORMAL LOW (ref 96–106)
Creatinine, Ser: 1.05 mg/dL (ref 0.76–1.27)
GFR calc Af Amer: 80 mL/min/{1.73_m2} (ref >59)
GFR calc non Af Amer: 70 mL/min/{1.73_m2} (ref >59)
Glucose: 138 mg/dL — ABNORMAL HIGH (ref 65–99)
Potassium: 3.7 mmol/L (ref 3.5–5.2)
Sodium: 137 mmol/L (ref 134–144)

## 2020-04-14 LAB — MAGNESIUM: Magnesium: 2.5 mg/dL — ABNORMAL HIGH (ref 1.6–2.3)

## 2020-04-14 NOTE — Telephone Encounter (Signed)
Patient is now in Carelink.  

## 2020-04-19 ENCOUNTER — Ambulatory Visit (INDEPENDENT_AMBULATORY_CARE_PROVIDER_SITE_OTHER): Payer: Medicare Other

## 2020-04-19 DIAGNOSIS — I4891 Unspecified atrial fibrillation: Secondary | ICD-10-CM

## 2020-04-19 LAB — CUP PACEART REMOTE DEVICE CHECK
Battery Remaining Longevity: 98 mo
Battery Voltage: 3.01 V
Brady Statistic RV Percent Paced: 0.1 %
Date Time Interrogation Session: 20211108022822
HighPow Impedance: 97 Ohm
Implantable Lead Implant Date: 20180625
Implantable Lead Location: 753860
Implantable Pulse Generator Implant Date: 20180625
Lead Channel Impedance Value: 304 Ohm
Lead Channel Impedance Value: 399 Ohm
Lead Channel Pacing Threshold Amplitude: 0.75 V
Lead Channel Pacing Threshold Pulse Width: 0.4 ms
Lead Channel Sensing Intrinsic Amplitude: 22.875 mV
Lead Channel Sensing Intrinsic Amplitude: 22.875 mV
Lead Channel Setting Pacing Amplitude: 2 V
Lead Channel Setting Pacing Pulse Width: 0.4 ms
Lead Channel Setting Sensing Sensitivity: 0.3 mV

## 2020-04-20 NOTE — Progress Notes (Signed)
Remote ICD transmission.   

## 2020-04-23 ENCOUNTER — Ambulatory Visit (HOSPITAL_COMMUNITY): Payer: Medicare Other | Attending: Internal Medicine

## 2020-04-23 ENCOUNTER — Other Ambulatory Visit: Payer: Self-pay

## 2020-04-23 DIAGNOSIS — I350 Nonrheumatic aortic (valve) stenosis: Secondary | ICD-10-CM | POA: Diagnosis present

## 2020-04-23 DIAGNOSIS — I4891 Unspecified atrial fibrillation: Secondary | ICD-10-CM | POA: Diagnosis present

## 2020-04-23 DIAGNOSIS — I5020 Unspecified systolic (congestive) heart failure: Secondary | ICD-10-CM | POA: Insufficient documentation

## 2020-04-23 DIAGNOSIS — I340 Nonrheumatic mitral (valve) insufficiency: Secondary | ICD-10-CM | POA: Diagnosis present

## 2020-04-23 LAB — ECHOCARDIOGRAM COMPLETE
AR max vel: 1.2 cm2
AV Area VTI: 1.14 cm2
AV Area mean vel: 1.2 cm2
AV Mean grad: 14.5 mmHg
AV Peak grad: 23.3 mmHg
Ao pk vel: 2.42 m/s
Area-P 1/2: 5.38 cm2
S' Lateral: 6.3 cm

## 2020-04-23 MED ORDER — PERFLUTREN LIPID MICROSPHERE
1.0000 mL | INTRAVENOUS | Status: AC | PRN
  Administered 2020-04-23: 1 mL via INTRAVENOUS

## 2020-04-26 NOTE — Addendum Note (Signed)
Addended by: Roney Mans A on: 04/26/2020 03:29 PM   Modules accepted: Orders

## 2020-05-10 ENCOUNTER — Other Ambulatory Visit: Payer: Self-pay

## 2020-05-10 ENCOUNTER — Encounter: Payer: Self-pay | Admitting: Internal Medicine

## 2020-05-10 ENCOUNTER — Ambulatory Visit (INDEPENDENT_AMBULATORY_CARE_PROVIDER_SITE_OTHER): Payer: Medicare Other | Admitting: Internal Medicine

## 2020-05-10 VITALS — BP 100/70 | HR 73 | Ht 68.0 in | Wt 234.0 lb

## 2020-05-10 DIAGNOSIS — E119 Type 2 diabetes mellitus without complications: Secondary | ICD-10-CM | POA: Diagnosis not present

## 2020-05-10 DIAGNOSIS — I4891 Unspecified atrial fibrillation: Secondary | ICD-10-CM

## 2020-05-10 DIAGNOSIS — I5020 Unspecified systolic (congestive) heart failure: Secondary | ICD-10-CM

## 2020-05-10 DIAGNOSIS — I350 Nonrheumatic aortic (valve) stenosis: Secondary | ICD-10-CM | POA: Diagnosis not present

## 2020-05-10 DIAGNOSIS — I10 Essential (primary) hypertension: Secondary | ICD-10-CM

## 2020-05-10 MED ORDER — DAPAGLIFLOZIN PROPANEDIOL 10 MG PO TABS: 10.0000 mg | ORAL_TABLET | Freq: Every day | ORAL | 11 refills | Status: DC

## 2020-05-10 NOTE — Progress Notes (Signed)
Cardiology Office Note:    Date:  05/10/2020   ID:  Chad Wilkerson, DOB 1945-12-02, MRN [id number]  PCP:  [name], NP  [facility] Cardiologist:  Christell Constant, MD  [facility] Electrophysiologist:  None   Referring MD: [name], NP   CC: Heart failure and atrial fibrillation   History of Present Illness:    Chad Wilkerson is a 74 y.o. male with a hx of Diabetes with HTN, HFrEF 25-30% with Medtronic ICD, 1st HB, mild to moderate AS, Mild MR, Atrial fibrillation on Dofetilide, eliquis, and coreg;  OSA on BiPAP seen 04/01/20.  In interim, established care with EP [name]). Started Toresmide 60 mg BID.  Patient notes that since then he feels better than when last seen.  Wife notes that she is breathing hard than in the past.  More short of breath going up and down staris.  Lost 6 lbs.  No PND, orthopnea.  No chest pain.  Notes no increased urination that usual.  No syncope.  Patient does not some tingling in his legs, worse with walking up the stairs  No leg pain at rest.  Past Medical History:  Diagnosis Date  . Adjustment disorder   . Atrial fibrillation (HCC)   . Benign colon polyp   . CHF (congestive heart failure) (HCC)   . Diabetes mellitus without complication (HCC)   . Diverticulitis   . Essential hypertension   . Heart disease   . Hypogonadism male   . Sleep apnea   . Urine incontinence   . Vitamin D deficiency     Past Surgical History:  Procedure Laterality Date  . CHOLECYSTECTOMY    . TONSILLECTOMY      Current Medications: Current Meds  Medication Sig  . Arginine (L-ARGININE-500) 500 MG CAPS Take by mouth.  . Ascorbic Acid (VITAMIN C) 1000 MG tablet Take 1,000 mg by mouth daily.  Marland Kitchen atorvastatin (LIPITOR) 20 MG tablet Take 20 mg by mouth daily.  . B Complex Vitamins (B COMPLEX PO) Take by mouth daily.  Marland Kitchen BIOTIN PO Take 10,000 Units by mouth daily.  . budesonide-formoterol (SYMBICORT) 160-4.5 MCG/ACT inhaler INL 2 PFS PO  BID IN THE MORNING AND IN THE EVE  . CALCIUM PO Take by mouth daily.  . carvedilol (COREG) 6.25 MG tablet Take 6.25 mg by mouth 2 (two) times daily.  . cholecalciferol (VITAMIN D3) 25 MCG (1000 UNIT) tablet Take 1,000 Units by mouth daily.  . Coenzyme Q10 (COQ10) 100 MG CAPS Take by mouth daily.  Marland Kitchen dofetilide (TIKOSYN) 125 MCG capsule Take 125 mcg by mouth every 12 (twelve) hours.  Marland Kitchen ELIQUIS 5 MG TABS tablet Take 5 mg by mouth 2 (two) times daily.  Marland Kitchen escitalopram (LEXAPRO) 10 MG tablet Take 10 mg by mouth daily.  Marland Kitchen MAGNESIUM PO Take 400 mg by mouth daily.  . metFORMIN (GLUCOPHAGE) 500 MG tablet Take by mouth.  . Multiple Vitamins-Minerals (ZINC PO) Take by mouth daily.  . Omega-3 Fatty Acids (FISH OIL PO) Take 1,000 mg by mouth.  Marland Kitchen OVER THE COUNTER MEDICATION Chorium  . OVER THE COUNTER MEDICATION L-caritine 500mg   . Potassium Chloride ER 20 MEQ TBCR Take 2 tablets by mouth daily.  spironolactone (ALDACTONE) 25 MG tablet Take 25 mg by mouth daily.  Marland Kitchen torsemide (DEMADEX) 20 MG tablet Take 3 tablets (60 mg total) by mouth 2 (two) times daily.  . vitamin E (VITAMIN E) 180 MG (400 UNITS) capsule Take 400 Units by mouth daily.  Marland Kitchen  Zn-Pyg Afri-Nettle-Saw Palmet (SAW PALMETTO COMPLEX PO) Take 540 mg by mouth daily.    Allergies:   Patient has no known allergies.   Social History   Socioeconomic History  . Marital status: Widowed    Spouse name: Not on file  . Number of children: Not on file  . Years of education: Not on file  . Highest education level: Not on file  Occupational History  . Not on file  Tobacco Use  . Smoking status: Former Games developer  . Smokeless tobacco: Never Used  Substance and Sexual Activity  . Alcohol use: Not Currently  . Drug use: Not Currently  . Sexual activity: Not on file  Other Topics Concern  . Not on file  Social History Narrative  . Not on file   Social Determinants of Health   Financial Resource Strain:   . Difficulty of Paying Living Expenses:  Not on file  Food Insecurity:   . Worried About Programme researcher, broadcasting/film/video in the Last Year: Not on file  . Ran Out of Food in the Last Year: Not on file  Transportation Needs:   . Lack of Transportation (Medical): Not on file  . Lack of Transportation (Non-Medical): Not on file  Physical Activity:   . Days of Exercise per Week: Not on file  . Minutes of Exercise per Session: Not on file  Stress:   . Feeling of Stress : Not on file  Social Connections:   . Frequency of Communication with Friends and Family: Not on file  . Frequency of Social Gatherings with Friends and Family: Not on file  . Attends Religious Services: Not on file  . Active Member of Clubs or Organizations: Not on file  . Attends Banker Meetings: Not on file  . Marital Status: Not on file    Family History: The patient's family history includes Asthma in his father; Heart attack in his mother and paternal grandfather; High Cholesterol in his mother; Kidney disease in his sister; Stroke in his maternal grandfather.  ROS:   Please see the history of present illness.    All other systems reviewed and are negative.  EKGs/Labs/Other Studies Reviewed:    The following studies were reviewed today:  EKG:   04/02/20: SR with 1st HB PR of 300 anterior infarct pattern and QTc 470 05/10/20 SR 1st Hb 300, occasional PVC, QTc 487 [place]  Recent Labs: 03/30/2020: ALT 23; Hemoglobin 15.0; Platelets 323 04/01/2020: NT-Pro BNP 5,001 04/13/2020: BUN 24; Creatinine, Ser 1.05; Magnesium 2.5; Potassium 3.7; Sodium 137  Risk Assessment/Calculations:     CHA2DS2-VASc Score = 3  This indicates a 3.2% annual risk of stroke. The patient's score is based upon: CHF History: 1 HTN History: 1 Diabetes History: 0 Stroke History: 0 Vascular Disease History: 0 Age Score: 1 Gender Score: 0     Physical Exam:    VS:  BP 100/70   Pulse 73   Ht 5\' 8"  (1.727 m)   Wt 234 lb (106.1 kg)   SpO2 96%   BMI 35.58 kg/m     Wt  Readings from Last 3 Encounters:  05/10/20 234 lb (106.1 kg)  04/13/20 238 lb (108 kg)  04/01/20 240 lb 6.4 oz (109 kg)    GEN: Obese well developed in no acute distress HEENT: Normal NECK: No JVD thought difficult exam; No carotid bruits LYMPHATICS: No lymphadenopathy CARDIAC: RRR, I/VI systolic flow murmur  rubs, gallops RESPIRATORY:  Clear to auscultation without rales, wheezing or rhonchi  ABDOMEN: Soft, non-tender , non-distended btu with dullness to percussion; no fluid wave MUSCULOSKELETAL:  +1 edema bilaterally; No deformity  SKIN: Warm and dry NEUROLOGIC:  Alert and oriented x 3 PSYCHIATRIC:  Normal affect   ASSESSMENT:    1. Moderate aortic stenosis   2. Heart failure with reduced ejection fraction (HCC)   3. Atrial fibrillation, unspecified type (HCC)   4. Diabetes mellitus with coincident hypertension (HCC)   5. Morbid obesity (HCC)    PLAN:    In order of problems listed above:  Chronic Heart Failure Reduced EF  20-25% Low Flow Low Gradient at least Moderate AS Mild MR Diabetes with hypertension- A1c 6.3 Morbid Obesity - NYHA class II, StageB , euvolemic, etiology from A fib vs AS - Torsemide, K and Mg:  Will check  - Daily weights, and fluid restriction of < 2 L  - Continue Coreg 6.25 mg BID  - No ARNI/ARB/ACEi in the setting of relatively low BP - Continue spironolactone 25 mg   - Would Start Farixga 10 mg Po daily - would get Dobutamine Stress Echo for assessment of Low Flow Low Gradient Aortic Stenosis - Discussed the risks and benefits of dobutamine stress echo including arrhythmia and death - given his AF, if our team is not comfortable with this, we will have the patient get a LHC and RHC with one of structural operators - Risks and benefits of cardiac catheterization have been discussed with the patient.  These include bleeding, infection, kidney damage, stroke, heart attack, death.  The patient understands these risks and is willing to proceed.  -  Has Medtronic ICD followed by EP  Paroxysmal Atrial Fibrillation CHADSVASC=3. - Continue Coreg & Tikosyn - and Eliquis - QTC today 485  Leg Claudication - at next visit will assess Arterial Duplex ABI's   2-3 months follow up unless new symptoms or abnormal test results warranting change in plan  Would be reasonable for APP Follow up  Medication Adjustments/Labs and Tests Ordered: Current medicines are reviewed at length with the patient today.  Concerns regarding medicines are outlined above.  Orders Placed This Encounter  Procedures  . Basic metabolic panel  . Magnesium  . Cardiac Stress Test: Informed Consent Details: Physician/Practitioner Attestation; Transcribe to consent form and obtain patient signature  . ECHOCARDIOGRAM STRESS TEST   Meds ordered this encounter  Medications  . dapagliflozin propanediol (FARXIGA) 10 MG TABS tablet    Sig: Take 1 tablet (10 mg total) by mouth daily before breakfast.    Dispense:  30 tablet    Refill:  11    Patient Instructions  Medication Instructions:  Your physician has recommended you make the following change in your medication:   START: farxiga 10 mg once a day  *If you need a refill on your cardiac medications before your next appointment, please call your pharmacy*   Lab Work: TODAY: BMET, MG  If you have labs (blood work) drawn today and your tests are completely normal, you will receive your results only by: Marland Kitchen MyChart Message (if you have MyChart) OR . A paper copy in the mail If you have any lab test that is abnormal or we need to change your treatment, we will call you to review the results.   Testing/Procedures: Your physician has requested that you have a dobutamine stress echocardiogram. For further information please visit [email or web address] Please follow instruction sheet as given.  Follow-Up: At [facility], you and your health needs are our priority.  As part  of our continuing mission to provide you  with exceptional heart care, we have created designated Provider Care Teams.  These Care Teams include your primary Cardiologist (physician) and Advanced Practice Providers (APPs -  Physician Assistants and Nurse Practitioners) who all work together to provide you with the care you need, when you need it.  We recommend signing up for the patient portal called "MyChart".  Sign up information is provided on this After Visit Summary.  MyChart is used to connect with patients for Virtual Visits (Telemedicine).  Patients are able to view lab/test results, encounter notes, upcoming appointments, etc.  Non-urgent messages can be sent to your provider as well.   To learn more about what you can do with MyChart, go to [email or web address].    Your next appointment:   2 month(s)  The format for your next appointment:   In Person  Provider:   [name], MD   Other Instructions   Dobutamine Stress Echocardiogram Instructions . Do not eat or drink 3 hours prior to your test, except you may have water. . Do not consume products containing caffeine (regular or decaffeinated) 12 hours prior to your test. (ex: coffee, chocolate, sodas, tea). . Do not take your carvedilol for 48 hours prior to your procedure     Signed, Christell Constant, MD  05/10/2020 4:13 PM    [facility]

## 2020-05-10 NOTE — Patient Instructions (Addendum)
Medication Instructions:  Your physician has recommended you make the following change in your medication:   START: farxiga 10 mg once a day  *If you need a refill on your cardiac medications before your next appointment, please call your pharmacy*   Lab Work: TODAY: BMET, MG  If you have labs (blood work) drawn today and your tests are completely normal, you will receive your results only by: Marland Kitchen MyChart Message (if you have MyChart) OR . A paper copy in the mail If you have any lab test that is abnormal or we need to change your treatment, we will call you to review the results.   Testing/Procedures: Your physician has requested that you have a dobutamine stress echocardiogram. For further information please visit [email or web address] Please follow instruction sheet as given.  Follow-Up: At [facility], you and your health needs are our priority.  As part of our continuing mission to provide you with exceptional heart care, we have created designated Provider Care Teams.  These Care Teams include your primary Cardiologist (physician) and Advanced Practice Providers (APPs -  Physician Assistants and Nurse Practitioners) who all work together to provide you with the care you need, when you need it.  We recommend signing up for the patient portal called "MyChart".  Sign up information is provided on this After Visit Summary.  MyChart is used to connect with patients for Virtual Visits (Telemedicine).  Patients are able to view lab/test results, encounter notes, upcoming appointments, etc.  Non-urgent messages can be sent to your provider as well.   To learn more about what you can do with MyChart, go to [email or web address].    Your next appointment:   2 month(s)  The format for your next appointment:   In Person  Provider:   [name], MD   Other Instructions   Dobutamine Stress Echocardiogram Instructions . Do not eat or drink 3 hours prior to your test, except  you may have water. . Do not consume products containing caffeine (regular or decaffeinated) 12 hours prior to your test. (ex: coffee, chocolate, sodas, tea). . Do not take your carvedilol for 48 hours prior to your procedure

## 2020-05-11 ENCOUNTER — Telehealth: Payer: Self-pay

## 2020-05-11 ENCOUNTER — Other Ambulatory Visit: Payer: Self-pay | Admitting: Cardiology

## 2020-05-11 LAB — BASIC METABOLIC PANEL
BUN/Creatinine Ratio: 27 — ABNORMAL HIGH (ref 10–24)
BUN: 26 mg/dL (ref 8–27)
CO2: 25 mmol/L (ref 20–29)
Calcium: 9.7 mg/dL (ref 8.6–10.2)
Chloride: 92 mmol/L — ABNORMAL LOW (ref 96–106)
Creatinine, Ser: 0.96 mg/dL (ref 0.76–1.27)
GFR calc Af Amer: 90 mL/min/{1.73_m2} (ref >59)
GFR calc non Af Amer: 78 mL/min/{1.73_m2} (ref >59)
Glucose: 104 mg/dL — ABNORMAL HIGH (ref 65–99)
Potassium: 3.6 mmol/L (ref 3.5–5.2)
Sodium: 136 mmol/L (ref 134–144)

## 2020-05-11 LAB — MAGNESIUM: Magnesium: 2.4 mg/dL — ABNORMAL HIGH (ref 1.6–2.3)

## 2020-05-11 MED ORDER — MAGNESIUM 400 MG PO TABS: 400.0000 mg | ORAL_TABLET | Freq: Every day | ORAL | 3 refills | Status: AC

## 2020-05-11 MED ORDER — POTASSIUM CHLORIDE ER 20 MEQ PO TBCR: 2.0000 | EXTENDED_RELEASE_TABLET | Freq: Every day | ORAL | 3 refills | Status: DC

## 2020-05-11 NOTE — Telephone Encounter (Signed)
Left a detailed message on patient's VM (DPR on file) letting him know that the dobutamine stress echo that was scheduled in our office on 12/23 has been cancelled. Let patient know that dobutamine stress echos for AS have to be done in the hospital. Let patient know that he will be contacted by the hospital staff to schedule. Instructed for patient to call back with any questions or concerns.

## 2020-05-11 NOTE — Progress Notes (Signed)
Refill for K+ and Mg+ sent to patients pharmacy. Called patient in reference to recent labs with no answer however left voice message.   Georgie Chard NP-C [facility]

## 2020-05-11 NOTE — Addendum Note (Signed)
Addended by: Kerrie Buffalo on: 05/11/2020 04:10 PM   Modules accepted: Orders

## 2020-05-18 ENCOUNTER — Ambulatory Visit: Payer: Medicare Other | Attending: Cardiology

## 2020-05-18 ENCOUNTER — Other Ambulatory Visit: Payer: Self-pay

## 2020-05-18 DIAGNOSIS — R0600 Dyspnea, unspecified: Secondary | ICD-10-CM | POA: Insufficient documentation

## 2020-05-18 DIAGNOSIS — R0609 Other forms of dyspnea: Secondary | ICD-10-CM

## 2020-05-18 DIAGNOSIS — Z7409 Other reduced mobility: Secondary | ICD-10-CM | POA: Insufficient documentation

## 2020-05-18 DIAGNOSIS — M6281 Muscle weakness (generalized): Secondary | ICD-10-CM | POA: Diagnosis present

## 2020-05-19 NOTE — Therapy (Addendum)
[place], [place], [address] Phone: [phone]   Fax:  [phone]  Physical Therapy Evaluation/Discharge  Patient Details  Name: MACCOY HAUBNER MRN: [id number] Date of Birth: 09/11/1945 Referring Provider (PT): Vickie Epley, MD   Encounter Date: 05/18/2020   PT End of Session - 05/19/20 0728    Visit Number 1    Number of Visits 17    Date for PT Re-Evaluation 07/24/20    Authorization Type MEDICARE PART A AND B    Progress Note Due on Visit 10    PT Start Time 1132    PT Stop Time 1218    PT Time Calculation (min) 46 min    Activity Tolerance Patient tolerated treatment well    Behavior During Therapy [facility] for tasks assessed/performed           Past Medical History:  Diagnosis Date  . Adjustment disorder   . Atrial fibrillation ([place])   . Benign colon polyp   . CHF (congestive heart failure) ([place])   . Diabetes mellitus without complication ([place])   . Diverticulitis   . Essential hypertension   . Heart disease   . Hypogonadism male   . Sleep apnea   . Urine incontinence   . Vitamin D deficiency     Past Surgical History:  Procedure Laterality Date  . CHOLECYSTECTOMY    . TONSILLECTOMY      There were no vitals filed for this visit.    Subjective Assessment - 05/19/20 0714    Subjective Pt reports since the onset of the covid pandemic with concerns and restrictions his activity level has decreased. Pt states he has a heart efficiency of 15-20%. Pt states he uses a [facility] for gait assistance occasionally for ambulation outside the home.    Pertinent History CHF, Obesity, Aortic stenosis, A fib    Limitations Walking;Standing;House hold activities    Patient Stated Goals To improve his strength and eudurance to be more active with ADLs and rescreational/social/family activities.    Currently in Pain? Yes    Pain Score 2     Pain Location Knee    Pain Orientation Right    Pain  Descriptors / Indicators Aching    Pain Type Chronic pain    Pain Onset More than a month ago    Pain Frequency Intermittent    Aggravating Factors  Increased activity level    Pain Relieving Factors Rest    Effect of Pain on Daily Activities Low impact on daily activities              [facility] PT Assessment - 05/19/20 0001      Assessment   Medical Diagnosis Physical deconditioning    Referring Provider (PT) Vickie Epley, MD    Onset Date/Surgical Date --   2 years ago   Hand Dominance Right    Prior Therapy No      Precautions   Precautions None      Restrictions   Weight Bearing Restrictions No      Balance Screen   Has the patient fallen in the past 6 months No      [place] residence    Living Arrangements Spouse/significant other    Type of [place] to enter    Entrance Stairs-Number of Steps 1    Entrance Stairs-Rails None    Home Layout Two level  Alternate Level Stairs-Number of Steps [place] - single point      Prior Function   Level of Independence Independent    Vocation Retired    Leisure To be more active      Charity fundraiser Status Within Functional Limits for tasks assessed      Observation/Other Assessments   Observations HOH    Focus on Therapeutic Outcomes (FOTO)  59% functional ability      Sensation   Light Touch Appears Intact      Posture/Postural Control   Posture/Postural Control Postural limitations    Postural Limitations Rounded Shoulders;Forward head      ROM / Strength   AROM / PROM / Strength AROM;Strength      AROM   Overall AROM Comments AROM for the UEs and LEs were WFLs      Strength   Overall Strength Comments Decreased strength for th sholders and hips at 4+/5      Transfers   Transfers Sit to Stand;Stand to Sit    Sit to Stand 7: Independent      Ambulation/Gait   Ambulation/Gait Yes    Ambulation/Gait  Assistance 6: Modified independent (Device/Increase time)    Gait Pattern Within Functional Limits;Step-through pattern;Antalgic   decreased gait speed, min antalgic gait R LE   Gait velocity 1.2 ft/sec    Pre-Gait Activities Breath sounds- no adventitious breath sounds noted for all lobes    Gait Comments 2MWT=172f. Pre test=O2 sat 96%, HR 76; Post Test= O2 sat 92%, HR 84; RPE = 13-Somewhat Hard                      Objective measurements completed on examination: See above findings.               PT Education - 05/19/20 0724    Education Details Eval findings, POC, walking program of 10 to 15 mins, 2x dailyas tolerated. [place] walking time as tolerated every 3-5 days. RPE scale- Not to exceed 13- Somewhat hard c activity, pursed lip breathing for recovery.    Person(s) Educated Patient;Spouse    Methods Explanation;Demonstration;Tactile cues;Verbal cues    Comprehension Verbalized understanding;Returned demonstration;Verbal cues required;Tactile cues required;Need further instruction            PT Short Term Goals - 05/19/20 0756      PT SHORT TERM GOAL #1   Title Pt will be Ind in an initial walking program    Status New    Target Date 06/09/20      PT SHORT TERM GOAL #2   Title Pt will be Ind in an initial HEP for UE, LE, and trunk strengthening    Status New    Target Date 06/09/20      PT SHORT TERM GOAL #3   Title Pt will demonstrate proper understanding of the RPE scale, pursed lip breathing and deep breathing    Status New    Target Date 06/09/20             PT Long Term Goals - 05/19/20 0801      PT LONG TERM GOAL #1   Title Pt will be Ind in a final walking program and HEP to maintain or progress achieve LOF    Status New    Target Date 07/24/20      PT LONG TERM GOAL #2   Title Pt will demonstrate 5/5 strength of his shoulder and  hip for improved ability to complete ADLs and functional mobility    Baseline 4+/5    Status New     Target Date 07/24/20      PT LONG TERM GOAL #3   Title Pt will dmeonstrate and improved 2MWT distance to 200+ ft and not exceeding a 13-Somewaht hard RPE    Baseline 134f    Status New    Target Date 07/24/20      PT LONG TERM GOAL #4   Title Pt will be able to tolerate walking 30 mins 2 to 3x a week for improved activity tolerance    Status New    Target Date 07/24/20                  Plan - 05/19/20 0731    Clinical Impression Statement Pt presents with decreased activity associated with the below cardiac issues and pt's decreased physical activity related to the pandemic concerns and restrictions. Pt has not had covid. With the 2MWT, pt's distance was limited to 1463fwith a walking pace of 1.2 ft per sec. Pt reported a RPE of 13-Somewhat Hard. Additionally, pt demonstrated min weakness of his shoulders and hips. Pt will benefit from PT 2w8 to address strength and activity tolerance deficits.    Personal Factors and Comorbidities Comorbidity 3+;Age;Past/Current Experience;Fitness;Time since onset of injury/illness/exacerbation    Comorbidities CHF, A fib, obesity, aortic stenosis, DM, BiPAP, adjustment disorder, HOH    Examination-Activity Limitations Locomotion Level    Examination-Participation Restrictions Community Activity;Yard Work    [employer] officervolving/Moderate complexity    Clinical Decision Making Moderate    Rehab Potential Good    PT Frequency 2x / week    PT Duration 8 weeks    PT Treatment/Interventions ADLs/Self Care Home Management;Cryotherapy;Iontophoresis 24m24ml Dexamethasone;Gait training;Stair training;Functional mobility training;Therapeutic activities;Therapeutic exercise;Balance training;Patient/family education;Vasopneumatic Device    PT Next Visit Plan Assess response to walking program, initiate UE and LE strengthening HEP, test 5xSTS    PT Home Exercise Plan Walking program- See Education    Consulted and Agree with Plan  of Care Patient           Patient will benefit from skilled therapeutic intervention in order to improve the following deficits and impairments:  Difficulty walking, Cardiopulmonary status limiting activity, Decreased endurance, Obesity, Decreased activity tolerance, Pain, Decreased strength  Visit Diagnosis: Decreased functional mobility and endurance  Dyspnea on exertion  Muscle weakness (generalized)     Problem List Patient Active Problem List   Diagnosis Date Noted  . Heart failure with reduced ejection fraction ([place]CC[place] [place]0/21/2021  . Atrial fibrillation ([facility]0/21/2021  . Moderate aortic stenosis 04/01/2020  . Mild mitral regurgitation 04/01/2020  . Morbid obesity ([place]0/21/2021  . Sleep apnea   . Diabetes mellitus with coincident hypertension ([place] . Diverticulitis   . Diabetes mellitus without complication ([place] . CHF (congestive heart failure) ([facility] . Adjustment disorder    AllGar Ponto, PT 05/19/20 8:15 AM   PHYSICAL THERAPY DISCHARGE SUMMARY  Visits from Start of Care: 1  Current functional level related to goals / functional outcomes: Unknown: Pt has not returned since eval   Remaining deficits: Unknown: Pt has not return since eval   Education / Equipment: HEP  Plan: Patient agrees to discharge.  Patient goals were not met. Patient is being discharged due to not returning since the last visit.  ?????        AllGar Ponto, PT  07/14/20 3:30 PM   [place], [place], [address] Phone: [phone]   Fax:  [phone]  Name: SEVRIN SALLY MRN: [id number] Date of Birth: 07-Aug-1945

## 2020-05-20 ENCOUNTER — Telehealth: Payer: Self-pay | Admitting: Adult Health

## 2020-05-20 ENCOUNTER — Encounter: Payer: Self-pay | Admitting: Adult Health

## 2020-05-20 DIAGNOSIS — I350 Nonrheumatic aortic (valve) stenosis: Secondary | ICD-10-CM

## 2020-05-20 NOTE — Telephone Encounter (Signed)
Left message for patient to call back and schedule Medicare Annual Wellness Visit (AWV) either virtually or in office.   Last AWV no information please schedule at anytime with [facility] Nurse Health Advisor 1 or 2   This should be a 45 minute visit. 

## 2020-05-24 MED ORDER — ESCITALOPRAM OXALATE 10 MG PO TABS: 10.0000 mg | ORAL_TABLET | Freq: Every day | ORAL | 0 refills | Status: DC

## 2020-05-31 ENCOUNTER — Ambulatory Visit (INDEPENDENT_AMBULATORY_CARE_PROVIDER_SITE_OTHER): Payer: Medicare Other

## 2020-05-31 ENCOUNTER — Other Ambulatory Visit: Payer: Self-pay

## 2020-05-31 VITALS — BP 130/80 | HR 67 | Temp 98.5°F | Ht 68.5 in | Wt 236.4 lb

## 2020-05-31 DIAGNOSIS — G4733 Obstructive sleep apnea (adult) (pediatric): Secondary | ICD-10-CM | POA: Diagnosis not present

## 2020-05-31 DIAGNOSIS — Z1211 Encounter for screening for malignant neoplasm of colon: Secondary | ICD-10-CM

## 2020-05-31 DIAGNOSIS — Z0000 Encounter for general adult medical examination without abnormal findings: Secondary | ICD-10-CM | POA: Diagnosis not present

## 2020-05-31 DIAGNOSIS — Z23 Encounter for immunization: Secondary | ICD-10-CM | POA: Diagnosis not present

## 2020-05-31 DIAGNOSIS — E1169 Type 2 diabetes mellitus with other specified complication: Secondary | ICD-10-CM | POA: Diagnosis not present

## 2020-05-31 DIAGNOSIS — I4891 Unspecified atrial fibrillation: Secondary | ICD-10-CM

## 2020-05-31 NOTE — Progress Notes (Signed)
Subjective:   Chad Wilkerson is a 74 y.o. male who presents for an Initial Medicare Annual Wellness Visit.  Review of Systems    N/A  Cardiac Risk Factors include: advanced age (>1men, >10 women);hypertension;male gender;dyslipidemia;diabetes mellitus     Objective:    Today's Vitals   05/31/20 1133 05/31/20 1139  BP: 130/80   Pulse: 67   Temp: 98.5 F (36.9 C)   TempSrc: Oral   SpO2: 94%   Weight: 236 lb 7 oz (107.2 kg)   Height: 5' 8.5" (1.74 m)   PainSc:  0-No pain   Body mass index is 35.43 kg/m.  Advanced Directives 05/31/2020 05/18/2020  Does Patient Have a Medical Advance Directive? No No  Would patient like information on creating a medical advance directive? Yes (MAU/Ambulatory/Procedural Areas - Information given) Yes (MAU/Ambulatory/Procedural Areas - Information given)    Current Medications (verified) Outpatient Encounter Medications as of 05/31/2020  Medication Sig  . Ascorbic Acid (VITAMIN C) 1000 MG tablet Take 1,000 mg by mouth daily.  Marland Kitchen atorvastatin (LIPITOR) 20 MG tablet Take 20 mg by mouth daily.  . B Complex Vitamins (B COMPLEX PO) Take by mouth daily.  Marland Kitchen BIOTIN PO Take 10,000 Units by mouth daily.  . budesonide-formoterol (SYMBICORT) 160-4.5 MCG/ACT inhaler INL 2 PFS PO BID IN THE MORNING AND IN THE EVE  . CALCIUM PO Take by mouth daily.  . carvedilol (COREG) 6.25 MG tablet Take 6.25 mg by mouth 2 (two) times daily.  . cholecalciferol (VITAMIN D3) 25 MCG (1000 UNIT) tablet Take 1,000 Units by mouth daily.  . Coenzyme Q10 (COQ10) 100 MG CAPS Take by mouth daily.  . dapagliflozin propanediol (FARXIGA) 10 MG TABS tablet Take 1 tablet (10 mg total) by mouth daily before breakfast.  . dofetilide (TIKOSYN) 125 MCG capsule Take 125 mcg by mouth every 12 (twelve) hours.  Marland Kitchen ELIQUIS 5 MG TABS tablet Take 5 mg by mouth 2 (two) times daily.  Marland Kitchen escitalopram (LEXAPRO) 10 MG tablet Take 1 tablet (10 mg total) by mouth daily.  . Magnesium 400 MG TABS Take  400 mg by mouth daily.  . metFORMIN (GLUCOPHAGE) 500 MG tablet Take by mouth.  . Multiple Vitamins-Minerals (ZINC PO) Take by mouth daily.  . Omega-3 Fatty Acids (FISH OIL PO) Take 1,000 mg by mouth.  Marland Kitchen OVER THE COUNTER MEDICATION Ch[place]ium  . OVER THE COUNTER MEDICATION L-caritine   . Potassium Chloride ER 20 MEQ TBCR Take 2 tablets by mouth daily.  Marland Kitchen spironolactone (ALDACTONE) 25 MG tablet Take 25 mg by mouth daily.  Marland Kitchen torsemide (DEMADEX) 20 MG tablet Take 3 tablets (60 mg total) by mouth 2 (two) times daily.  . vitamin E 180 MG (400 UNITS) capsule Take 400 Units by mouth daily.  Marland Kitchen Zn-Pyg Afri-Nettle-Saw Palmet (SAW PALMETTO COMPLEX PO) Take 540 mg by mouth daily.  . Arginine (L-ARGININE-500) 500 MG CAPS Take by mouth. (Patient not taking: Reported on 05/31/2020)   No facility-administered encounter medications on file as of 05/31/2020.    Allergies (verified) Patient has no known allergies.   History: Past Medical History:  Diagnosis Date  . Adjustment disorder   . Atrial fibrillation (HCC)   . Benign colon polyp   . CHF (congestive heart failure) (HCC)   . Diabetes mellitus without complication (HCC)   . Diverticulitis   . Essential hypertension   . Heart disease   . Hypogonadism male   . Sleep apnea   . Urine incontinence   . Vitamin D  deficiency    Past Surgical History:  Procedure Laterality Date  . CHOLECYSTECTOMY    . TONSILLECTOMY     Family History  Problem Relation Age of Onset  . Heart attack Mother   . High Cholesterol Mother   . Asthma Father   . Kidney disease Sister   . Stroke Maternal Grandfather   . Heart attack Paternal Grandfather    Social History   Socioeconomic History  . Marital status: Widowed    Spouse name: Not on file  . Number of children: Not on file  . Years of education: Not on file  . Highest education level: Not on file  Occupational History  . Not on file  Tobacco Use  . Smoking status: Former Games developer  . Smokeless  tobacco: Never Used  Substance and Sexual Activity  . Alcohol use: Not Currently  . Drug use: Not Currently  . Sexual activity: Not on file  Other Topics Concern  . Not on file  Social History Narrative  . Not on file   Social Determinants of Health   Financial Resource Strain: Low Risk   . Difficulty of Paying Living Expenses: Not hard at all  Food Insecurity: No Food Insecurity  . Worried About Programme researcher, broadcasting/film/video in the Last Year: Never true  . Ran Out of Food in the Last Year: Never true  Transportation Needs: No Transportation Needs  . Lack of Transportation (Medical): No  . Lack of Transportation (Non-Medical): No  Physical Activity: Inactive  . Days of Exercise per Week: 0 days  . Minutes of Exercise per Session: 0 min  Stress: No Stress Concern Present  . Feeling of Stress : Not at all  Social Connections: Moderately Isolated  . Frequency of Communication with Friends and Family: Once a week  . Frequency of Social Gatherings with Friends and Family: More than three times a week  . Attends Religious Services: Never  . Active Member of Clubs or Organizations: No  . Attends Banker Meetings: Never  . Marital Status: Living with partner    Tobacco Counseling Counseling given: Not Answered   Clinical Intake:  Pre-visit preparation completed: Yes  Pain : No/denies pain Pain Score: 0-No pain     Nutritional Risks: None Diabetes: Yes CBG done?: No Did pt. bring in CBG monitor from home?: No  How often do you need to have someone help you when you read instructions, pamphlets, or other written materials from your doctor or pharmacy?: 1 - Never What is the last grade level you completed in school?: College  Diabetic?Yes Nutrition Risk Assessment:  Has the patient had any N/V/D within the last 2 months?  No  Does the patient have any non-healing wounds?  No  Has the patient had any unintentional weight loss or weight gain?  No   Diabetes:  Is  the patient diabetic?  Yes  If diabetic, was a CBG obtained today?  No  Did the patient bring in their glucometer from home?  No  How often do you monitor your CBG's? Patient states checks blood sugars twice a day.   Financial Strains and Diabetes Management:  Are you having any financial strains with the device, your supplies or your medication? No .  Does the patient want to be seen by Chronic Care Management for management of their diabetes?  No  Would the patient like to be referred to a Nutritionist or for Diabetic Management?  No   Diabetic Exams:  Diabetic  Eye Exam: Overdue for diabetic eye exam. Pt has been advised about the importance in completing this exam. Patient advised to call and schedule an eye exam. Diabetic Foot Exam: Overdue, Pt has been advised about the importance in completing this exam. Pt is scheduled for diabetic foot exam on 09/29/2020.]  Interpreter Needed?: No  Information entered by :: SCrews,LPN   Activities of Daily Living In your present state of health, do you have any difficulty performing the following activities: 05/31/2020  Hearing? Y  Vision? N  Difficulty concentrating or making decisions? N  Walking or climbing stairs? N  Dressing or bathing? N  Doing errands, shopping? N  Preparing Food and eating ? N  Using the Toilet? N  In the past six months, have you accidently leaked urine? N  Do you have problems with loss of bowel control? N  Managing your Medications? N  Managing your Finances? N  Housekeeping or managing your Housekeeping? N  Some recent data might be hidden    Patient Care Team: [name], NP as PCP - General (Family Medicine) Christell Constant, MD as PCP - Cardiology (Cardiology)  Indicate any recent Medical Services you may have received from other than [facility] providers in the past year (date may be approximate).     Assessment:   This is a routine wellness examination for [place].  Hearing/Vision screen   Hearing Screening   125Hz  250Hz  500Hz  1000Hz  2000Hz  3000Hz  4000Hz  6000Hz  8000Hz   Right ear:           Left ear:           Vision Screening Comments: Patient states gets eye examined once per year   Dietary issues and exercise activities discussed: Current Exercise Habits: The patient does not participate in regular exercise at present  Goals    . Exercise 150 min/wk Moderate Activity    . Patient Stated     I would like to take a train ride across the .    . Weight (lb) < 200 lb (90.7 kg)      Depression Screen PHQ 2/9 Scores 05/31/2020  PHQ - 2 Score 0  PHQ- 9 Score 0    Fall Risk Fall Risk  05/31/2020  Falls in the past year? 0  Number falls in past yr: 0  Injury with Fall? 0  Risk for fall due to : No Fall Risks  Follow up Follow up appointment    FALL RISK PREVENTION PERTAINING TO THE HOME:  Any stairs in or around the home? Yes  If so, are there any without handrails? No  Home free of loose throw rugs in walkways, pet beds, electrical cords, etc? Yes  Adequate lighting in your home to reduce risk of falls? Yes   ASSISTIVE DEVICES UTILIZED TO PREVENT FALLS:  Life alert? No  Use of a cane, walker or w/c? No  Grab bars in the bathroom? Yes  Shower chair or bench in shower? Yes  Elevated toilet seat or a handicapped toilet? Yes   TIMED UP AND GO:  Was the test performed? Yes .  Length of time to ambulate 10 feet: 5 sec.   Gait steady and fast without use of assistive device  Cognitive Function:   Normal cognitive status assessed by direct observation by this Nurse Health Advisor. No abnormalities found.        Immunizations Immunization History  Administered Date(s) Administered  . Influenza Inj Mdck Quad Pf 07/01/2015, 04/10/2019  . Influenza, High Dose  Seasonal PF 04/08/2018, 04/09/2019  . Influenza,trivalent, recombinat, inj, PF 02/24/2020  . Influenza-Unspecified 02/11/2020  . PFIZER SARS-COV-2 Vaccination 08/05/2019, 08/26/2019  .  Pneumococcal Conjugate-13 05/31/2020    TDAP status: Due, Education has been provided regarding the importance of this vaccine. Advised may receive this vaccine at local pharmacy or Health Dept. Aware to provide a copy of the vaccination record if obtained from local pharmacy or Health Dept. Verbalized acceptance and understanding.  Flu Vaccine status: Up to date  Pneumococcal vaccine status: Up to date  Covid-19 vaccine status: Completed vaccines  Qualifies for Shingles Vaccine? Yes   Zostavax completed Yes   Shingrix Completed?: No.    Education has been provided regarding the importance of this vaccine. Patient has been advised to call insurance company to determine out of pocket expense if they have not yet received this vaccine. Advised may also receive vaccine at local pharmacy or Health Dept. Verbalized acceptance and understanding.  Screening Tests Health Maintenance  Topic Date Due  . Hepatitis C Screening  Never done  . FOOT EXAM  Never done  . OPHTHALMOLOGY EXAM  Never done  . URINE MICROALBUMIN  Never done  . TETANUS/TDAP  Never done  . COLONOSCOPY  Never done  . COVID-19 Vaccine (3 - Pfizer risk 4-dose series) 09/23/2019  . HEMOGLOBIN A1C  09/28/2020  . PNA vac Low Risk Adult (2 of 2 - PPSV23) 05/31/2021  . INFLUENZA VACCINE  Completed    Health Maintenance  Health Maintenance Due  Topic Date Due  . Hepatitis C Screening  Never done  . FOOT EXAM  Never done  . OPHTHALMOLOGY EXAM  Never done  . URINE MICROALBUMIN  Never done  . TETANUS/TDAP  Never done  . COLONOSCOPY  Never done  . COVID-19 Vaccine (3 - Pfizer risk 4-dose series) 09/23/2019    Colorectal cancer screening: Referral to GI placed 05/31/2020. Pt aware the office will call re: appt.  Lung Cancer Screening: (Low Dose CT Chest recommended if Age 42-80 years, 30 pack-year currently smoking OR have quit w/in 15years.) does not qualify.   Lung Cancer Screening Referral: N/A   Additional  Screening:  Hepatitis C Screening: does qualify;   Vision Screening: Recommended annual ophthalmology exams for early detection of glaucoma and other disorders of the eye. Is the patient up to date with their annual eye exam?  Yes  Who is the provider or what is the name of the office in which the patient attends annual eye exams? Patient has new eye doctor unsure of his name  If pt is not established with a provider, would they like to be referred to a provider to establish care? No .   Dental Screening: Recommended annual dental exams for proper oral hygiene  Community Resource Referral / Chronic Care Management: CRR required this visit?  No   CCM required this visit?  No      Plan:     I have personally reviewed and noted the following in the patient's chart:   . Medical and social history . Use of alcohol, tobacco or illicit drugs  . Current medications and supplements . Functional ability and status . Nutritional status . Physical activity . Advanced directives . List of other physicians . Hospitalizations, surgeries, and ER visits in previous 12 months . Vitals . Screenings to include cognitive, depression, and falls . Referrals and appointments  In addition, I have reviewed and discussed with patient certain preventive protocols, quality metrics, and best practice recommendations. A  written personalized care plan for preventive services as well as general preventive health recommendations were provided to patient.     Theodora Blow, LPN   [id number]   Nurse Notes: None

## 2020-05-31 NOTE — Patient Instructions (Signed)
[name] , Thank you for taking time to come for your Medicare Wellness Visit. I appreciate your ongoing commitment to your health goals. Please review the following plan we discussed and let me know if I can assist you in the future.   Screening recommendations/referrals: Colonoscopy: Currently due, orders placed this visit  Recommended yearly ophthalmology/optometry visit for glaucoma screening and checkup Recommended yearly dental visit for hygiene and checkup  Vaccinations: Influenza vaccine: Up to date, next due fall 2022  Pneumococcal vaccine: Completed series at this visit. Tdap vaccine: Currently due, you may contact your insurance company to discuss cost or you may await injury to receive  Shingles vaccine: Currently due for Shingrix, if you wish to receive we recommend that you do so at your local pharmacy as it is less expensive     Advanced directives: Advance directive discussed with you today. Even though you declined this today please call our office should you change your mind and we can give you the proper paperwork for you to fill out.   Conditions/risks identified: None   Next appointment: 09/29/2020 @ 1:00 PM with [name], NP  Preventive Care 65 Years and Older, Male Preventive care refers to lifestyle choices and visits with your health care provider that can promote health and wellness. What does preventive care include?  A yearly physical exam. This is also called an annual well check.  Dental exams once or twice a year.  Routine eye exams. Ask your health care provider how often you should have your eyes checked.  Personal lifestyle choices, including:  Daily care of your teeth and gums.  Regular physical activity.  Eating a healthy diet.  Avoiding tobacco and drug use.  Limiting alcohol use.  Practicing safe sex.  Taking low doses of aspirin every day.  Taking vitamin and mineral supplements as recommended by your health care  provider. What happens during an annual well check? The services and screenings done by your health care provider during your annual well check will depend on your age, overall health, lifestyle risk factors, and family history of disease. Counseling  Your health care provider may ask you questions about your:  Alcohol use.  Tobacco use.  Drug use.  Emotional well-being.  Home and relationship well-being.  Sexual activity.  Eating habits.  History of falls.  Memory and ability to understand (cognition).  Work and work Astronomer. Screening  You may have the following tests or measurements:  Height, weight, and BMI.  Blood pressure.  Lipid and cholesterol levels. These may be checked every 5 years, or more frequently if you are over 56 years old.  Skin check.  Lung cancer screening. You may have this screening every year starting at age 44 if you have a 30-pack-year history of smoking and currently smoke or have quit within the past 15 years.  Fecal occult blood test (FOBT) of the stool. You may have this test every year starting at age 63.  Flexible sigmoidoscopy or colonoscopy. You may have a sigmoidoscopy every 5 years or a colonoscopy every 10 years starting at age 76.  Prostate cancer screening. Recommendations will vary depending on your family history and other risks.  Hepatitis C blood test.  Hepatitis B blood test.  Sexually transmitted disease (STD) testing.  Diabetes screening. This is done by checking your blood sugar (glucose) after you have not eaten for a while (fasting). You may have this done every 1-3 years.  Abdominal aortic aneurysm (AAA) screening. You may need  this if you are a current or former smoker.  Osteoporosis. You may be screened starting at age 69 if you are at high risk. Talk with your health care provider about your test results, treatment options, and if necessary, the need for more tests. Vaccines  Your health care provider  may recommend certain vaccines, such as:  Influenza vaccine. This is recommended every year.  Tetanus, diphtheria, and acellular pertussis (Tdap, Td) vaccine. You may need a Td booster every 10 years.  Zoster vaccine. You may need this after age 76.  Pneumococcal 13-valent conjugate (PCV13) vaccine. One dose is recommended after age 56.  Pneumococcal polysaccharide (PPSV23) vaccine. One dose is recommended after age 66. Talk to your health care provider about which screenings and vaccines you need and how often you need them. This information is not intended to replace advice given to you by your health care provider. Make sure you discuss any questions you have with your health care provider. Document Released: 06/25/2015 Document Revised: 02/16/2016 Document Reviewed: 03/30/2015 Elsevier Interactive Patient Education  2017 [employer].  Fall Prevention in the Home Falls can cause injuries. They can happen to people of all ages. There are many things you can do to make your home safe and to help prevent falls. What can I do on the outside of my home?  Regularly fix the edges of walkways and driveways and fix any cracks.  Remove anything that might make you trip as you walk through a door, such as a raised step or threshold.  Trim any bushes or trees on the path to your home.  Use bright outdoor lighting.  Clear any walking paths of anything that might make someone trip, such as rocks or tools.  Regularly check to see if handrails are loose or broken. Make sure that both sides of any steps have handrails.  Any raised decks and porches should have guardrails on the edges.  Have any leaves, snow, or ice cleared regularly.  Use sand or salt on walking paths during winter.  Clean up any spills in your garage right away. This includes oil or grease spills. What can I do in the bathroom?  Use night lights.  Install grab bars by the toilet and in the tub and shower. Do not use  towel bars as grab bars.  Use non-skid mats or decals in the tub or shower.  If you need to sit down in the shower, use a plastic, non-slip stool.  Keep the floor dry. Clean up any water that spills on the floor as soon as it happens.  Remove soap buildup in the tub or shower regularly.  Attach bath mats securely with double-sided non-slip rug tape.  Do not have throw rugs and other things on the floor that can make you trip. What can I do in the bedroom?  Use night lights.  Make sure that you have a light by your bed that is easy to reach.  Do not use any sheets or blankets that are too big for your bed. They should not hang down onto the floor.  Have a firm chair that has side arms. You can use this for support while you get dressed.  Do not have throw rugs and other things on the floor that can make you trip. What can I do in the kitchen?  Clean up any spills right away.  Avoid walking on wet floors.  Keep items that you use a lot in easy-to-reach places.  If you  need to reach something above you, use a strong step stool that has a grab bar.  Keep electrical cords out of the way.  Do not use floor polish or wax that makes floors slippery. If you must use wax, use non-skid floor wax.  Do not have throw rugs and other things on the floor that can make you trip. What can I do with my stairs?  Do not leave any items on the stairs.  Make sure that there are handrails on both sides of the stairs and use them. Fix handrails that are broken or loose. Make sure that handrails are as long as the stairways.  Check any carpeting to make sure that it is firmly attached to the stairs. Fix any carpet that is loose or worn.  Avoid having throw rugs at the top or bottom of the stairs. If you do have throw rugs, attach them to the floor with carpet tape.  Make sure that you have a light switch at the top of the stairs and the bottom of the stairs. If you do not have them, ask  someone to add them for you. What else can I do to help prevent falls?  Wear shoes that:  Do not have high heels.  Have rubber bottoms.  Are comfortable and fit you well.  Are closed at the toe. Do not wear sandals.  If you use a stepladder:  Make sure that it is fully opened. Do not climb a closed stepladder.  Make sure that both sides of the stepladder are locked into place.  Ask someone to hold it for you, if possible.  Clearly mark and make sure that you can see:  Any grab bars or handrails.  First and last steps.  Where the edge of each step is.  Use tools that help you move around (mobility aids) if they are needed. These include:  Canes.  Walkers.  Scooters.  Crutches.  Turn on the lights when you go into a dark area. Replace any light bulbs as soon as they burn out.  Set up your furniture so you have a clear path. Avoid moving your furniture around.  If any of your floors are uneven, fix them.  If there are any pets around you, be aware of where they are.  Review your medicines with your doctor. Some medicines can make you feel dizzy. This can increase your chance of falling. Ask your doctor what other things that you can do to help prevent falls. This information is not intended to replace advice given to you by your health care provider. Make sure you discuss any questions you have with your health care provider. Document Released: 03/25/2009 Document Revised: 11/04/2015 Document Reviewed: 07/03/2014 Elsevier Interactive Patient Education  2017 [employer].

## 2020-06-03 ENCOUNTER — Other Ambulatory Visit (HOSPITAL_COMMUNITY): Payer: Medicare Other

## 2020-06-07 ENCOUNTER — Ambulatory Visit: Payer: Medicare Other | Admitting: Internal Medicine

## 2020-06-10 ENCOUNTER — Encounter: Payer: Self-pay | Admitting: Adult Health

## 2020-06-16 ENCOUNTER — Encounter: Payer: Self-pay | Admitting: Adult Health

## 2020-06-16 ENCOUNTER — Ambulatory Visit (INDEPENDENT_AMBULATORY_CARE_PROVIDER_SITE_OTHER): Payer: Medicare Other | Admitting: Adult Health

## 2020-06-16 ENCOUNTER — Other Ambulatory Visit: Payer: Self-pay

## 2020-06-16 VITALS — BP 100/66 | Temp 98.1°F | Wt 233.0 lb

## 2020-06-16 DIAGNOSIS — M25511 Pain in right shoulder: Secondary | ICD-10-CM

## 2020-06-16 NOTE — Patient Instructions (Addendum)
I am concerned about a dislocation or rotator cuff injury   I am going to send you to [facility]. They open at 5:30 pm but you can get there early.   [address] [place], [place] [address]

## 2020-06-16 NOTE — Progress Notes (Signed)
Subjective:    Patient ID: Chad Wilkerson, male    DOB: March 12, 1946, 75 y.o.   MRN: [id number]  HPI 75 year old male who  has a past medical history of Adjustment disorder, Atrial fibrillation (HCC), Benign colon polyp, CHF (congestive heart failure) (HCC), Diabetes mellitus without complication (HCC), Diverticulitis, Essential hypertension, Heart disease, Hypogonadism male, Sleep apnea, Urine incontinence, and Vitamin D deficiency.   Patient being seen in the office today for an acute issue of right sided shoulder pain. He reports that he fell out of bed about a week ago, landing on his left side. About 3 days after the incident his right shoulder started hurting and he has been unable to lift his right arm. He denies any other trauma or aggravating injury   Review of Systems See HPI   Past Medical History:  Diagnosis Date  . Adjustment disorder   . Atrial fibrillation (HCC)   . Benign colon polyp   . CHF (congestive heart failure) (HCC)   . Diabetes mellitus without complication (HCC)   . Diverticulitis   . Essential hypertension   . Heart disease   . Hypogonadism male   . Sleep apnea   . Urine incontinence   . Vitamin D deficiency     Social History   Socioeconomic History  . Marital status: Widowed    Spouse name: Not on file  . Number of children: Not on file  . Years of education: Not on file  . Highest education level: Not on file  Occupational History  . Not on file  Tobacco Use  . Smoking status: Former Games developer  . Smokeless tobacco: Never Used  Substance and Sexual Activity  . Alcohol use: Not Currently  . Drug use: Not Currently  . Sexual activity: Not on file  Other Topics Concern  . Not on file  Social History Narrative  . Not on file   Social Determinants of Health   Financial Resource Strain: Low Risk   . Difficulty of Paying Living Expenses: Not hard at all  Food Insecurity: No Food Insecurity  . Worried About Programme researcher, broadcasting/film/video in the Last  Year: Never true  . Ran Out of Food in the Last Year: Never true  Transportation Needs: No Transportation Needs  . Lack of Transportation (Medical): No  . Lack of Transportation (Non-Medical): No  Physical Activity: Inactive  . Days of Exercise per Week: 0 days  . Minutes of Exercise per Session: 0 min  Stress: No Stress Concern Present  . Feeling of Stress : Not at all  Social Connections: Moderately Isolated  . Frequency of Communication with Friends and Family: Once a week  . Frequency of Social Gatherings with Friends and Family: More than three times a week  . Attends Religious Services: Never  . Active Member of Clubs or Organizations: No  . Attends Banker Meetings: Never  . Marital Status: Living with partner  Intimate Partner Violence: Not At Risk  . Fear of Current or Ex-Partner: No  . Emotionally Abused: No  . Physically Abused: No  . Sexually Abused: No    Past Surgical History:  Procedure Laterality Date  . CHOLECYSTECTOMY    . TONSILLECTOMY      Family History  Problem Relation Age of Onset  . Heart attack Mother   . High Cholesterol Mother   . Asthma Father   . Kidney disease Sister   . Stroke Maternal Grandfather   . Heart attack Paternal  Grandfather     No Known Allergies  Current Outpatient Medications on File Prior to Visit  Medication Sig Dispense Refill  . Arginine (L-ARGININE-500) 500 MG CAPS Take by mouth.    . Ascorbic Acid (VITAMIN C) 1000 MG tablet Take 1,000 mg by mouth daily.    Marland Kitchen atorvastatin (LIPITOR) 20 MG tablet Take 20 mg by mouth daily.    . B Complex Vitamins (B COMPLEX PO) Take by mouth daily.    Marland Kitchen BIOTIN PO Take 10,000 Units by mouth daily.    . budesonide-formoterol (SYMBICORT) 160-4.5 MCG/ACT inhaler INL 2 PFS PO BID IN THE MORNING AND IN THE EVE    . CALCIUM PO Take by mouth daily.    . carvedilol (COREG) 6.25 MG tablet Take 6.25 mg by mouth 2 (two) times daily.    . cholecalciferol (VITAMIN D3) 25 MCG (1000  UNIT) tablet Take 1,000 Units by mouth daily.    . Coenzyme Q10 (COQ10) 100 MG CAPS Take by mouth daily.    . dapagliflozin propanediol (FARXIGA) 10 MG TABS tablet Take 1 tablet (10 mg total) by mouth daily before breakfast. 30 tablet 11  . dofetilide (TIKOSYN) 125 MCG capsule Take 125 mcg by mouth every 12 (twelve) hours.    Marland Kitchen ELIQUIS 5 MG TABS tablet Take 5 mg by mouth 2 (two) times daily.    Marland Kitchen escitalopram (LEXAPRO) 10 MG tablet Take 1 tablet (10 mg total) by mouth daily. 90 tablet 0  . Magnesium 400 MG TABS Take 400 mg by mouth daily. 60 tablet 3  . metFORMIN (GLUCOPHAGE) 500 MG tablet Take by mouth.    . Multiple Vitamins-Minerals (ZINC PO) Take by mouth daily.    . Omega-3 Fatty Acids (FISH OIL PO) Take 1,000 mg by mouth.    Marland Kitchen OVER THE COUNTER MEDICATION Chorium    . OVER THE COUNTER MEDICATION L-caritine 500mg     . Potassium Chloride ER 20 MEQ TBCR Take 2 tablets by mouth daily. 60 tablet 3  . spironolactone (ALDACTONE) 25 MG tablet Take 25 mg by mouth daily.    torsemide (DEMADEX) 20 MG tablet Take 3 tablets (60 mg total) by mouth 2 (two) times daily. 540 tablet 1  . vitamin E 180 MG (400 UNITS) capsule Take 400 Units by mouth daily.    Marland Kitchen Zn-Pyg Afri-Nettle-Saw Palmet (SAW PALMETTO COMPLEX PO) Take 540 mg by mouth daily.     No current facility-administered medications on file prior to visit.    BP 100/66   Temp 98.1 F (36.7 C)   Wt 233 lb (105.7 kg)   BMI 34.91 kg/m       Objective:   Physical Exam Vitals and nursing note reviewed.  Constitutional:      Appearance: Normal appearance.  Cardiovascular:     Rate and Rhythm: Normal rate and regular rhythm.     Pulses: Normal pulses.     Heart sounds: Normal heart sounds.  Pulmonary:     Effort: Pulmonary effort is normal.     Breath sounds: Normal breath sounds.  Musculoskeletal:        General: Tenderness present.     Right shoulder: Deformity (?), tenderness and bony tenderness present. No crepitus. Decreased  range of motion. Decreased strength. Normal pulse.  Skin:    General: Skin is warm and dry.     Capillary Refill: Capillary refill takes less than 2 seconds.  Neurological:     General: No focal deficit present.     Mental  Status: He is alert and oriented to person, place, and time.       Assessment & Plan:  1. Acute pain of right shoulder - Due to it being late in the day the only thing we can do in the office is an xray today.  - Will send to [facility]  - concern for dislocation or rotator cuff injury  - Shoulder immobilizer placed   Dorothyann Peng, NP

## 2020-06-17 ENCOUNTER — Telehealth: Payer: Self-pay | Admitting: Pharmacist

## 2020-06-17 NOTE — Telephone Encounter (Signed)
Called patient to follow up with inquiry about patient assistance medication renewals. Left voicemail to return call to discuss which medications he was referring to. Will plan to reach out again next week.

## 2020-06-18 ENCOUNTER — Other Ambulatory Visit: Payer: Self-pay | Admitting: Orthopaedic Surgery

## 2020-06-18 DIAGNOSIS — M25511 Pain in right shoulder: Secondary | ICD-10-CM

## 2020-06-21 ENCOUNTER — Telehealth: Payer: Self-pay

## 2020-06-21 NOTE — Telephone Encounter (Signed)
Hi Cory,   Are you ok with me seeing him? If so, can you please put in a CCM referral? Looks like his insurance will cover it. If not, I will reach back out to him to discuss.  Thank you, Maddie

## 2020-06-21 NOTE — Telephone Encounter (Signed)
Optivol elevated and thoracic impedance below reference line and trending down.   Spoke with pt.  He reports he has always had some SOB and has not noticed an increase in it as of lately.  Pt confirmed he is taking Torsemide 60mg  BID as directed.    Offered ICM clinic for ongoing management, patient declined at this time.

## 2020-06-22 ENCOUNTER — Telehealth: Payer: Self-pay | Admitting: Adult Health

## 2020-06-22 ENCOUNTER — Other Ambulatory Visit: Payer: Self-pay | Admitting: Adult Health

## 2020-06-22 DIAGNOSIS — I10 Essential (primary) hypertension: Secondary | ICD-10-CM

## 2020-06-22 DIAGNOSIS — E1169 Type 2 diabetes mellitus with other specified complication: Secondary | ICD-10-CM

## 2020-06-22 MED ORDER — LINAGLIPTIN 5 MG PO TABS: 5.0000 mg | ORAL_TABLET | Freq: Every day | ORAL | 0 refills | Status: DC

## 2020-06-22 NOTE — Progress Notes (Signed)
  Chronic Care Management   Outreach Note  06/22/2020 Name: OKLEY MAGNUSSEN MRN: [id number] DOB: [birth year]  Referred by: [name], NP Reason for referral : No chief complaint on file.   An unsuccessful telephone outreach was attempted today. The patient was referred to the pharmacist for assistance with care management and care coordination.   Follow Up Plan:   Carley Perdue [employer] Scheduler

## 2020-06-22 NOTE — Telephone Encounter (Signed)
..   pt said he is returning your call . He would like a call back [phone]

## 2020-06-22 NOTE — Progress Notes (Signed)
  Chronic Care Management   Note  06/22/2020 Name: Chad Wilkerson MRN: [id number] DOB: June 22, 1945  Chad Wilkerson is a 75 y.o. year old male who is a primary care patient of [name], NP. I reached out to Claire Shown Rojero by phone today in response to a referral sent by Chad Wilkerson's PCP, [name], NP.   Chad Wilkerson was given information about Chronic Care Management services today including:  1. CCM service includes personalized support from designated clinical staff supervised by his physician, including individualized plan of care and coordination with other care providers 2. 24/7 contact phone numbers for assistance for urgent and routine care needs. 3. Service will only be billed when office clinical staff spend 20 minutes or more in a month to coordinate care. 4. Only one practitioner may furnish and bill the service in a calendar month. 5. The patient may stop CCM services at any time (effective at the end of the month) by phone call to the office staff.   Patient agreed to services and verbal consent obtained.   Follow up plan:   Carley Perdue [employer] Scheduler

## 2020-06-22 NOTE — Telephone Encounter (Signed)
Spoke to patient and informed him that his cardiologist is fine with him coming off Glyxambi since he is taking [place]. Will send in Tradjenta for him

## 2020-06-23 ENCOUNTER — Encounter: Payer: Self-pay | Admitting: Adult Health

## 2020-06-24 ENCOUNTER — Telehealth: Payer: Self-pay | Admitting: Pharmacist

## 2020-06-24 NOTE — Chronic Care Management (AMB) (Signed)
Chronic Care Management Pharmacy Assistant   Name: Chad Wilkerson  MRN: [id number] DOB: 06/10/1946  Reason for Encounter:Medication Review/Initial Questions for Pharmacist visit on 06-25-2020   Patient Questions: 1. Have you seen any other providers since your last visit? . Physical therapy 2. Any changes in your medications or health? No 3. Any side effects from any medications? No 4. Do you have any symptoms or problems not managed by your medications? No 5. Any concerns about your health right now? No 6. Has your provider asked that you check blood pressure, blood sugar, or follow a special diet at home?  . Blood Sugar BID 7. Do you get any type of exercise regularly?  Yes 8. Can you think of a goal you would like to reach for your health?  . Lose some weight 9. Do you have any problems getting your medications? No 10. Is there anything that you would like to discuss during the appointment? No  The patient was asked to please bring medications, blood pressure/ blood sugar log, and supplements to his appointment.        PCP : [name], NP  Allergies:  No Known Allergies  Medications: Outpatient Encounter Medications as of 06/24/2020  Medication Sig  . Arginine (L-ARGININE-500) 500 MG CAPS Take by mouth.  . Ascorbic Acid (VITAMIN C) 1000 MG tablet Take 1,000 mg by mouth daily.  Marland Kitchen atorvastatin (LIPITOR) 20 MG tablet Take 20 mg by mouth daily.  . B Complex Vitamins (B COMPLEX PO) Take by mouth daily.  Marland Kitchen BIOTIN PO Take 10,000 Units by mouth daily.  . budesonide-formoterol (SYMBICORT) 160-4.5 MCG/ACT inhaler INL 2 PFS PO BID IN THE MORNING AND IN THE EVE  . CALCIUM PO Take by mouth daily.  . carvedilol (COREG) 6.25 MG tablet Take 6.25 mg by mouth 2 (two) times daily.  . cholecalciferol (VITAMIN D3) 25 MCG (1000 UNIT) tablet Take 1,000 Units by mouth daily.  . Coenzyme Q10 (COQ10) 100 MG CAPS Take by mouth daily.  . dapagliflozin propanediol (FARXIGA) 10 MG TABS tablet  Take 1 tablet (10 mg total) by mouth daily before breakfast.  . dofetilide (TIKOSYN) 125 MCG capsule Take 125 mcg by mouth every 12 (twelve) hours.  Marland Kitchen ELIQUIS 5 MG TABS tablet Take 5 mg by mouth 2 (two) times daily.  Marland Kitchen escitalopram (LEXAPRO) 10 MG tablet Take 1 tablet (10 mg total) by mouth daily.  Marland Kitchen linagliptin (TRADJENTA) 5 MG TABS tablet Take 1 tablet (5 mg total) by mouth daily.  . Magnesium 400 MG TABS Take 400 mg by mouth daily.  . metFORMIN (GLUCOPHAGE) 500 MG tablet Take by mouth.  . Multiple Vitamins-Minerals (ZINC PO) Take by mouth daily.  . Omega-3 Fatty Acids (FISH OIL PO) Take 1,000 mg by mouth.  Marland Kitchen OVER THE COUNTER MEDICATION Chorium  . OVER THE COUNTER MEDICATION L-caritine 500mg   . Potassium Chloride ER 20 MEQ TBCR Take 2 tablets by mouth daily.  spironolactone (ALDACTONE) 25 MG tablet Take 25 mg by mouth daily.  Marland Kitchen torsemide (DEMADEX) 20 MG tablet Take 3 tablets (60 mg total) by mouth 2 (two) times daily.  . vitamin E 180 MG (400 UNITS) capsule Take 400 Units by mouth daily.  Marland Kitchen Zn-Pyg Afri-Nettle-Saw Palmet (SAW PALMETTO COMPLEX PO) Take 540 mg by mouth daily.   No facility-administered encounter medications on file as of 06/24/2020.    Current Diagnosis: Patient Active Problem List   Diagnosis Date Noted  . Heart failure with reduced ejection fraction (  HCC) 04/01/2020  . Atrial fibrillation (HCC) 04/01/2020  . Moderate aortic stenosis 04/01/2020  . Mild mitral regurgitation 04/01/2020  . Morbid obesity (HCC) 04/01/2020  . Sleep apnea   . Diabetes mellitus with coincident hypertension (HCC)   . Diverticulitis   . Diabetes mellitus without complication (HCC)   . CHF (congestive heart failure) (HCC)   . Adjustment disorder     Goals Addressed   None     Follow-Up:  Pharmacist Review   Berenice Bouton, [facility] Clinical Pharmacy Assistant [phone]

## 2020-06-25 ENCOUNTER — Ambulatory Visit: Payer: Medicare Other | Admitting: Pharmacist

## 2020-06-25 ENCOUNTER — Other Ambulatory Visit: Payer: Self-pay

## 2020-06-25 DIAGNOSIS — I10 Essential (primary) hypertension: Secondary | ICD-10-CM

## 2020-06-25 DIAGNOSIS — E1169 Type 2 diabetes mellitus with other specified complication: Secondary | ICD-10-CM

## 2020-06-25 NOTE — Chronic Care Management (AMB) (Signed)
Chronic Care Management Pharmacy  Name: Chad Wilkerson  MRN: [id number] DOB: 12/02/1945  Initial Planning Appointment: completed 06/24/20  Initial Questions: 1. Have you seen any other providers since your last visit? n/a 2. Any changes in your medicines or health? No   Chief Complaint/ HPI  Chad Wilkerson,  75 y.o. , male presents for their Initial CCM visit with the clinical pharmacist In office.  PCP : Shirline FreesNafziger, Cory, NP  Their chronic conditions include: Afib, HF, HLD, DM, adjustment disorder, sleep apnea  Office Visits: -06/16/20 Shirline Freesory Nafziger, NP: Patient presented with pain of right shoulder.   -05/31/20 Theodora BlowShannon Crews, LPN: Patient presented for medicare annual wellness visit.  03/30/20 Shirline Freesory Nafziger, NP: Patient presented to establish care. Referral placed for cardiology and pulmonology.  Consult Visit: -05/10/20 Riley LamMahesh Chandrasekhar, MD (Cardiology): Patient presented for Afib and heart failure. Prescribed Farxiga daily.   -04/13/20 Steffanie Dunnameron Lambert, MD (cardiology): Patient presented for heart failure follow up.  -04/01/20 Riley LamMahesh Chandrasekhar, MD (Cardiology): Patient presented for establishing care for heart failure and Afib.  Medications: Outpatient Encounter Medications as of 06/25/2020  Medication Sig  . Arginine (L-ARGININE-500) 500 MG CAPS Take by mouth.  . Ascorbic Acid (VITAMIN C) 1000 MG tablet Take 1,000 mg by mouth daily.  Marland Kitchen. atorvastatin (LIPITOR) 20 MG tablet Take 20 mg by mouth daily.  . B Complex Vitamins (B COMPLEX PO) Take by mouth daily.  Marland Kitchen. BIOTIN PO Take 10,000 Units by mouth daily.  . budesonide-formoterol (SYMBICORT) 160-4.5 MCG/ACT inhaler INL 2 PFS PO BID IN THE MORNING AND IN THE EVE  . CALCIUM PO Take by mouth daily.  . carvedilol (COREG) 6.25 MG tablet Take 6.25 mg by mouth 2 (two) times daily.  . cholecalciferol (VITAMIN D3) 25 MCG (1000 UNIT) tablet Take 1,000 Units by mouth daily.  . Coenzyme Q10 (COQ10) 100 MG CAPS Take by mouth  daily.  . dapagliflozin propanediol (FARXIGA) 10 MG TABS tablet Take 1 tablet (10 mg total) by mouth daily before breakfast.  . dofetilide (TIKOSYN) 125 MCG capsule Take 125 mcg by mouth every 12 (twelve) hours.  Marland Kitchen. ELIQUIS 5 MG TABS tablet Take 5 mg by mouth 2 (two) times daily.  Marland Kitchen. escitalopram (LEXAPRO) 10 MG tablet Take 1 tablet (10 mg total) by mouth daily.  . Magnesium 400 MG TABS Take 400 mg by mouth daily.  . metFORMIN (GLUCOPHAGE) 500 MG tablet Take 500 mg by mouth. Taking 1 tablet with breakfast and 3 tablets with dinner  . Multiple Vitamins-Minerals (ZINC PO) Take by mouth daily.  . Omega-3 Fatty Acids (FISH OIL PO) Take 1,000 mg by mouth.  Marland Kitchen. OVER THE COUNTER MEDICATION Chorium  . OVER THE COUNTER MEDICATION L-caritine 500mg   . Potassium Chloride ER 20 MEQ TBCR Take 2 tablets by mouth daily.  Marland Kitchen. spironolactone (ALDACTONE) 25 MG tablet Take 25 mg by mouth daily.  Marland Kitchen. torsemide (DEMADEX) 20 MG tablet Take 3 tablets (60 mg total) by mouth 2 (two) times daily.  . vitamin E 180 MG (400 UNITS) capsule Take 400 Units by mouth daily.  Marland Kitchen. Zn-Pyg Afri-Nettle-Saw Palmet (SAW PALMETTO COMPLEX PO) Take 540 mg by mouth daily.  . [DISCONTINUED] linagliptin (TRADJENTA) 5 MG TABS tablet Take 1 tablet (5 mg total) by mouth daily.   No facility-administered encounter medications on file as of 06/25/2020.   Patient presented with his friend [name] to the appointment as she helps with his medications and he has a difficult time hearing. Patient recently moved back to [place] and  was living in [place] previously.   Patient reports that he doesn't eat a lot of sweets and does eat lots of veggies, fish/tuna/salmon, and stays away from tomato sauce as this caused elevated blood sugars. He does eat a lot of fruit such as oranges, apples, pears, kiwi, and pineapple.   Patient has not been very active since COVID. He was doing exercises at gym previously but now he just walks some and wants to get back to  using his stretching bands for all sorts of exercises.   He currently denies any problems with his medications other than difficulty affording his brand name medications. Patient also has been taking several supplements for some time and was unsure of why some of them were started.  Current Diagnosis/Assessment:  Goals Addressed            This Visit's Progress   . Pharmacy care plan       CARE PLAN ENTRY (see longitudinal plan of care for additional care plan information)  Current Barriers:  . Chronic Disease Management support, education, and care coordination needs related to Hyperlipidemia, Diabetes, Atrial Fibrillation, and Heart Failure   Afib BP Readings from Last 3 Encounters:  07/05/20 110/62  06/16/20 100/66  05/31/20 130/80   . Pharmacist Clinical Goal(s): o Over the next 90 days, patient will work with PharmD and providers to maintain BP goal <140/90 and HR < 110 beats per minute . Current regimen:  . Eliquis 5 mg 1 tablet twice daily . Dofetilide 125 mcg 1 capsule twice daily . Interventions: o Discussed monitoring for signs of bleeding such as unexplained and excessive bleeding from a cut or injury, easy or excessive bruising, blood in urine or stools, and nosebleeds without a known cause o Discussed requirements for patient assistance application for Eliquis . Patient self care activities - Over the next 90 days, patient will: o Check BP and HR weekly, document, and provide at future appointments o Ensure daily salt intake < 2300 mg/day  Hyperlipidemia No results found for: LDLCALC, LDLDIRECT . Pharmacist Clinical Goal(s): o Over the next 90 days, patient will work with PharmD and providers to achieve LDL goal < 70 and TG < 150 . Current regimen:  . Atorvastatin 20 mg 1 tablet daily . Fish oil 1000 mg 1 tablet daily . Interventions: o Discussed lowering cholesterol through diet by: Marland Kitchen Limiting foods with cholesterol such as liver and other organ meats, egg  yolks, shrimp, and whole milk dairy products . Avoiding saturated fats and trans fats and incorporating healthier fats, such as lean meat, nuts, and unsaturated oils like canola and olive oils . Eating foods with soluble fiber such as whole-grain cereals such as oatmeal and oat bran, fruits such as apples, bananas, oranges, pears, and prunes, legumes such as kidney beans, lentils, chick peas, black-eyed peas, and lima beans, and green leafy vegetables . Limiting alcohol intake o Discussed the use of fish oil with elevated triglycerides and the recommended dose of 2 to 4 g in order to see a benefit . Patient self care activities - Over the next 90 days, patient will: o Continue atorvastatin and stop taking fish oil daily depending on triglycerides  Diabetes Lab Results  Component Value Date/Time   HGBA1C 6.3 (H) 03/30/2020 02:43 PM   . Pharmacist Clinical Goal(s): o Over the next 90 days, patient will work with PharmD and providers to maintain A1c goal <7% . Current regimen:  . Metformin 500 mg 1 tablet in AM in 3  tablets with dinner . Farxiga 10 mg 1 tablet daily . Interventions: o Discussed following the healthy plate method which includes: . Fill half of your plate with nonstarchy vegetables, such as spinach, broccoli, carrots and tomatoes. Lenox Ahr a quarter of your plate with a protein, such as tuna, lean pork or chicken. Lenox Ahr the last quarter with a whole-grain item, such as brown rice, or a starchy vegetable, such as green peas or potatoes. . Include "good" fats such as nuts or avocados in small amounts. o Educated on mechanism of action of Rybelsus and proper administration . Patient self care activities - Over the next 90 days, patient will: o Check blood sugar once daily, document, and provide at future appointments o Contact provider with any episodes of hypoglycemia  Heart failure . Pharmacist Clinical Goal(s) o Over the next 90 days, patient will work with PharmD and  providers to manage symptoms of heart failure and improve heart muscle function . Current regimen:  . Carvedilol 6.25 mg 1 tablet twice daily . Farxiga 10 mg 1 tablet daily . Spironolactone 25 mg 1 tablet daily . Potassium chloride 20 mEq 2 tablets daily . Torsemide 20 mg 3 tablets twice daily . Interventions: o Discussed weighing daily and monitoring sodium intake . Patient self care activities - Over the next 90 days, patient will: o Continue current medications  Medication management . Pharmacist Clinical Goal(s): o Over the next 90 days, patient will work with PharmD and providers to maintain optimal medication adherence . Current pharmacy: [facility] . Interventions o Comprehensive medication review performed. o Continue current medication management strategy . Patient self care activities - Over the next 90 days, patient will: o Take medications as prescribed o Report any questions or concerns to PharmD and/or provider(s)  Initial goal documentation       SDOH Interventions   Flowsheet Row Most Recent Value  SDOH Interventions   Financial Strain Interventions Other (Comment)  [working on patient assistance]       AFIB   Patient is currently rate controlled. Office heart rates are  Pulse Readings from Last 3 Encounters:  07/05/20 75  05/31/20 67  05/10/20 73    CHA2DS2-VASc Score = 3  The patient's score is based upon: CHF History: Yes HTN History: Yes Diabetes History: No Stroke History: No Vascular Disease History: No Age Score: 1 Gender Score: 0   {  Patient has failed these meds in past: unknown Patient is currently controlled on the following medications:  . Eliquis 5 mg 1 tablet twice daily . Dofetilide 125 mcg 1 capsule twice daily  We discussed:  monitoring HR at home and monitoring for signs of bleeding such as unexplained and excessive bleeding from a cut or injury, easy or excessive bruising, blood in urine or stools, and nosebleeds without  a known cause -Requirements for patient assistance for Eliquis as patient was previously receiving through manufacturer   Plan Provided BMS patient assistance application for patient to complete and bring to his cardiologist. Continue current medications    Heart Failure   Type: Systolic  Last ejection fraction: 20-25% NYHA Class: II (slight limitation of activity) AHA HF Stage: B (Heart disease present - no symptoms present)  Patient has failed these meds in past: unknown Patient is currently controlled on the following medications:  . Carvedilol 6.25 mg 1 tablet BID . Farxiga 10 mg 1 tablet daily . Spironolactone 25 mg 1 tablet daily . Potassium chloride 20 mEq 2 tablets daily . Torsemide  20 mg 3 tablets twice daily  We discussed weighing daily; if you gain more than 3 pounds in one day or 5 pounds in one week call your doctor and monitoring blood pressure  Plan Plan to readdress the need for ACE/ARB at follow up. Continue current medications   Hyperlipidemia   LDL goal < 70  Last lipids No results found for: CHOL, HDL, LDLCALC, LDLDIRECT, TRIG, CHOLHDL Hepatic Function Latest Ref Rng & Units 03/30/2020  Total Protein 6.1 - 8.1 g/dL 7.9  AST 10 - 35 U/L 26  ALT 9 - 46 U/L 23  Total Bilirubin 0.2 - 1.2 mg/dL 8.6(P)     The ASCVD Risk score Denman George DC Jr., et al., 2013) failed to calculate for the following reasons:   Cannot find a previous HDL lab   Cannot find a previous total cholesterol lab   Patient has failed these meds in past: unknown Patient is currently controlled on the following medications:  . Atorvastatin 20 mg 1 tablet daily . Fish oil 1000 mg 1 tablet daily  We discussed:  diet and exercise extensively  -Patient reports that all of his cholesterol panel values were WNL when last checked  Plan Recommended stopping fish oil to cut down on pill burden as patient is not taking optimal dose.  Plan to verify most recent cholesterol panel results.   Continue current medications  Diabetes   A1c goal <7%  Recent Relevant Labs: Lab Results  Component Value Date/Time   HGBA1C 6.3 (H) 03/30/2020 02:43 PM    Last diabetic Eye exam: No results found for: HMDIABEYEEXA  Last diabetic Foot exam: No results found for: HMDIABFOOTEX   Checking BG: 2x per Day   Recent FBG Readings: 120 Recent pre-meal BG readings: n/a Recent 2hr PP BG readings:  n/a Recent HS BG readings: 138  Patient has failed these meds in past: Glyxambi (switched to separate medications) Patient is currently controlled on the following medications: Marland Kitchen Metformin 500 mg 1 tablet in AM in 3 tablets with dinner . Farxiga 10 mg 1 tablet daily . Tradjenta - not started  We discussed: diet and exercise extensively  --Following the healthy plate method which includes: . Fill half of your plate with nonstarchy vegetables, such as spinach, broccoli, carrots and tomatoes. Lenox Ahr a quarter of your plate with a protein, such as tuna, lean pork or chicken. Lenox Ahr the last quarter with a whole-grain item, such as brown rice, or a starchy vegetable, such as green peas or potatoes. . Include "good" fats such as nuts or avocados in small amounts. -Provided healthy plate handout -Discussed recommendations for moderate aerobic exercise for 150 minutes/week spread out over 5 days for heart healthy lifestyle -Benefits of GLP1 therapy compared to DPP4 for additional CV benefits and provided sample of Rybelsus in office  Plan DM follow up in 1 month to assess tolerability of Rybelsus and dose titration. Continue current medications   Adjustment disorder   Depression screen [facility] 2/9 05/31/2020  Decreased Interest 0  Down, Depressed, Hopeless 0  PHQ - 2 Score 0  Altered sleeping 0  Tired, decreased energy 0  Change in appetite 0  Feeling bad or failure about yourself  0  Trouble concentrating 0  Moving slowly or fidgety/restless 0  Suicidal thoughts 0  PHQ-9 Score 0   Difficult doing work/chores Not difficult at all    Patient has failed these meds in past: unknown Patient is currently controlled on the following medications:  . Escitalopram  10 mg 1 tablet daily  We discussed: patient reports this has helped with tinnitus as well  Plan  Continue current medications   Sleep apnea   Last spirometry score: unknown (patient reported occurred in [place] but unsure of assessment)   Patient has failed these meds in past: unknown Patient is currently controlled on the following medications: Marland Kitchen Symbicort 160-4.5 mcg/act inhale 2 puffs twice daily   Using maintenance inhaler regularly? Yes Frequency of rescue inhaler use:  infrequently  We discussed:  proper inhaler technique and patient assistance requirements for Symbicort  Plan Patient will establish care with pulmonologist and plan to reassess spirometry at that time. Provided patient assistance documentation for Symbicort. Continue current medications   Miscellaneous/OTC   Patient is currently on the following medications:  Marland Kitchen Vitamin E 400 units 1 capsule daily . Saw Palmetto 540 mg 1 tablet daily . Magnesium 400 mg 1 tablet daily . Zinc daily  . Chorium daily  . L-caritine 500 mg 1 tablet daily . Coenzyme Q10 1 capsule daily  . Calcium daily . Vitamin D3 1000 units daily . Vitamin C 1000 mg 1 tablet daily . L-arginine 500 mg 1 capsule daily . Vitamin B complex daily . Biotin 10,000 units daily  We discussed:  Patient was unsure of the reason he was on several of these supplements  Plan Plan to reassess the need for supplementation at follow up.  Continue current medications  Vaccines   Reviewed and discussed patient's vaccination history.    Immunization History  Administered Date(s) Administered  . Influenza Inj Mdck Quad Pf 07/01/2015, 04/10/2019  . Influenza, High Dose Seasonal PF 04/08/2018, 04/09/2019  . Influenza,trivalent, recombinat, inj, PF 02/24/2020  .  Influenza-Unspecified 02/11/2020  . PFIZER(Purple Top)SARS-COV-2 Vaccination 08/05/2019, 08/26/2019, 03/26/2020  . Pneumococcal Conjugate-13 05/31/2020  . Pneumococcal Polysaccharide-23 08/04/2015  . Tdap 04/05/2012    Plan  Recommended patient receive shingles vaccine at pharmacy.   Medication Management   Patient's preferred pharmacy is:  [id number] [place], [place] - [address] AT [facility] [address] [facility] [address] [facility] [address] [place] [address] Phone: [phone] Fax: [phone]   We discussed: Current pharmacy is preferred with insurance plan and patient is satisfied with pharmacy services  Plan  Continue current medication management strategy  Patient has [facility] Medicare Part D insurance and reports copay for Rybelsus is cost prohibitive at this time.  Reviewed application process for [employer] patient assistance program. Patient meets income/out of pocket spend criteria for the program. Patient will provide proof of income, out of pocket spend report, and will sign application. Will collaborate with prescriber [name], NP for the provider portion of application. Once completed, application will be submitted via Fax    Follow up: 3 month office visit  Gaylord Shih, PharmD [facility] Clinical Pharmacist [facility] [place] [phone]

## 2020-06-25 NOTE — Chronic Care Management (AMB) (Deleted)
Chronic Care Management Pharmacy  Name: Chad Wilkerson  MRN: [id number] DOB: 22-Feb-1946  Initial Planning Appointment: completed 06/24/20  Initial Questions: 1. Have you seen any other providers since your last visit? n/a 2. Any changes in your medicines or health? No   Chief Complaint/ HPI  Chad Wilkerson,  75 y.o. , male presents for their Initial CCM visit with the clinical pharmacist In office.  PCP : [name], NP  Their chronic conditions include: {CHL AMB CHRONIC MEDICAL CONDITIONS:[phone]}  Office Visits: -06/16/20 [name], NP: Patient presented with pain of right shoulder.   -05/31/20 Theodora Blow, LPN: Patient presented for medicare annual wellness visit.  03/30/20 [name], NP: Patient presented to establish care. Referral placed for cardiology and pulmonology.  Consult Visit: -05/10/20 [name], MD (Cardiology): Patient presented for Afib and heart failure. Prescribed Farxiga daily.   -04/13/20 Steffanie Dunn, MD (cardiology): Patient presented for heart failure follow up.  -04/01/20 [name], MD (Cardiology): Patient presented for establishing care for heart failure and Afib.  Medications: Outpatient Encounter Medications as of 06/25/2020  Medication Sig  . Arginine (L-ARGININE-500) 500 MG CAPS Take by mouth.  . Ascorbic Acid (VITAMIN C) 1000 MG tablet Take 1,000 mg by mouth daily.  Marland Kitchen atorvastatin (LIPITOR) 20 MG tablet Take 20 mg by mouth daily.  . B Complex Vitamins (B COMPLEX PO) Take by mouth daily.  Marland Kitchen BIOTIN PO Take 10,000 Units by mouth daily.  . budesonide-formoterol (SYMBICORT) 160-4.5 MCG/ACT inhaler INL 2 PFS PO BID IN THE MORNING AND IN THE EVE  . CALCIUM PO Take by mouth daily.  . carvedilol (COREG) 6.25 MG tablet Take 6.25 mg by mouth 2 (two) times daily.  . cholecalciferol (VITAMIN D3) 25 MCG (1000 UNIT) tablet Take 1,000 Units by mouth daily.  . Coenzyme Q10 (COQ10) 100 MG CAPS Take by mouth daily.   . dapagliflozin propanediol (FARXIGA) 10 MG TABS tablet Take 1 tablet (10 mg total) by mouth daily before breakfast.  . dofetilide (TIKOSYN) 125 MCG capsule Take 125 mcg by mouth every 12 (twelve) hours.  Marland Kitchen ELIQUIS 5 MG TABS tablet Take 5 mg by mouth 2 (two) times daily.  Marland Kitchen escitalopram (LEXAPRO) 10 MG tablet Take 1 tablet (10 mg total) by mouth daily.  Marland Kitchen linagliptin (TRADJENTA) 5 MG TABS tablet Take 1 tablet (5 mg total) by mouth daily.  . Magnesium 400 MG TABS Take 400 mg by mouth daily.  . metFORMIN (GLUCOPHAGE) 500 MG tablet Take by mouth.  . Multiple Vitamins-Minerals (ZINC PO) Take by mouth daily.  . Omega-3 Fatty Acids (FISH OIL PO) Take 1,000 mg by mouth.  Marland Kitchen OVER THE COUNTER MEDICATION Chorium  . OVER THE COUNTER MEDICATION L-caritine 500mg   . Potassium Chloride ER 20 MEQ TBCR Take 2 tablets by mouth daily.  spironolactone (ALDACTONE) 25 MG tablet Take 25 mg by mouth daily.  Marland Kitchen torsemide (DEMADEX) 20 MG tablet Take 3 tablets (60 mg total) by mouth 2 (two) times daily.  . vitamin E 180 MG (400 UNITS) capsule Take 400 Units by mouth daily.  Marland Kitchen Zn-Pyg Afri-Nettle-Saw Palmet (SAW PALMETTO COMPLEX PO) Take 540 mg by mouth daily.   No facility-administered encounter medications on file as of 06/25/2020.     Current Diagnosis/Assessment:  Goals Addressed   None     Hypertension   BP goal is:  {CHL HP UPSTREAM Pharmacist BP ranges:[phone]}  Office blood pressures are  BP Readings from Last 3 Encounters:  06/16/20 100/66  05/31/20 130/80  05/10/20 100/70   Patient checks BP at home {CHL HP BP Monitoring [phone]} Patient home BP readings are ranging: ***  Patient has failed these meds in the past: *** Patient is currently {CHL Controlled/Uncontrolled:[phone]} on the following medications:  . ***  We discussed {CHL HP Upstream Pharmacy discussion:[phone]}  Plan  Continue {CHL HP Upstream Pharmacy [phone]}     AFIB   Patient is  currently {CHL HP BP RATE/RHYTHM:[phone]} controlled. Office heart rates are  Pulse Readings from Last 3 Encounters:  05/31/20 67  05/10/20 73  04/13/20 81    CHA2DS2-VASc Score = 3  The patient's score is based upon: CHF History: Yes HTN History: Yes Diabetes History: No Stroke History: No Vascular Disease History: No Age Score: 1 Gender Score: 0   {  Patient has failed these meds in past: *** Patient is currently {CHL Controlled/Uncontrolled:[phone]} on the following medications:  . Eliquis 5 mg 1 tablet twice daily . Dofetilide 125 mcg 1 capsule twice daily  We discussed:  {CHL HP Upstream Pharmacy discussion:[phone]}  Plan  Continue {CHL HP Upstream Pharmacy [phone]}     Heart Failure   Type: Systolic  Last ejection fraction: 20-25% NYHA Class: II (slight limitation of activity) AHA HF Stage: B (Heart disease present - no symptoms present)  Patient has failed these meds in past: *** Patient is currently {CHL Controlled/Uncontrolled:[phone]} on the following medications:  . Carvedilol 6.25 mg 1 tablet BID . Farxiga 10 mg 1 tablet daily . Spironolactone 25 mg 1 tablet daily - taking? Cardio notes said BP was dropping too low . Potassium chloride 20 mEq 2 tablets daily . ACE/ARB? Marland Kitchen Torsemide 20 mg 3 tablets twice daily  We discussed {CHL HP Upstream Pharmacy discussion:[phone]}  Plan  Continue {CHL HP Upstream Pharmacy [phone]}   Hyperlipidemia   LDL goal < 70  Last lipids No results found for: CHOL, HDL, LDLCALC, LDLDIRECT, TRIG, CHOLHDL Hepatic Function Latest Ref Rng & Units 03/30/2020  Total Protein 6.1 - 8.1 g/dL 7.9  AST 10 - 35 U/L 26  ALT 9 - 46 U/L 23  Total Bilirubin 0.2 - 1.2 mg/dL 5.8(K)     The ASCVD Risk score Denman George DC Jr., et al., 2013) failed to calculate for the following reasons:   Cannot find a previous HDL lab   Cannot find a previous total cholesterol lab   Patient has failed these meds in  past: *** Patient is currently {CHL Controlled/Uncontrolled:[phone]} on the following medications:  . Atorvastatin 20 mg 1 tablet daily . Fish oil 1000 mg 1 tablet daily  We discussed:  {CHL HP Upstream Pharmacy discussion:[phone]}  Plan  Continue {CHL HP Upstream Pharmacy [phone]}  Diabetes   A1c goal {A1c goals:23924}  Recent Relevant Labs: Lab Results  Component Value Date/Time   HGBA1C 6.3 (H) 03/30/2020 02:43 PM    Last diabetic Eye exam: No results found for: HMDIABEYEEXA  Last diabetic Foot exam: No results found for: HMDIABFOOTEX   Checking BG: {CHL HP Blood Glucose Monitoring [id number]  Recent FBG Readings: *** Recent pre-meal BG readings: *** Recent 2hr PP BG readings:  *** Recent HS BG readings: ***  Patient has failed these meds in past: *** Patient is currently {CHL Controlled/Uncontrolled:769 122 7246} on the following medications: Marland Kitchen Metformin 500 mg 1 tablet daily? Marcelline Deist 10 mg 1 tablet daily . Tradjenta - not started  We discussed: {CHL HP Upstream Pharmacy discussion:[phone]}   Eye exam? Foot exam?  Plan  Continue {CHL HP Upstream Pharmacy DXIPJ:[id number]}  Adjustment disorder   Depression screen [facility] 2/9 05/31/2020  Decreased Interest 0  Down, Depressed, Hopeless 0  PHQ - 2 Score 0  Altered sleeping 0  Tired, decreased energy 0  Change in appetite 0  Feeling bad or failure about yourself  0  Trouble concentrating 0  Moving slowly or fidgety/restless 0  Suicidal thoughts 0  PHQ-9 Score 0  Difficult doing work/chores Not difficult at all    Patient has failed these meds in past: *** Patient is currently {CHL Controlled/Uncontrolled:[phone]} on the following medications:  . Escitalopram 10 mg 1 tablet daily  We discussed:  ***  Plan  Continue {CHL HP Upstream Pharmacy Plans:[phone]}   Miscellaneous/OTC   Patient has failed these meds in past: *** Patient is currently {CHL  Controlled/Uncontrolled:[phone]} on the following medications:  Marland Kitchen Vitamin E 400 units 1 capsule daily . Saw Palmetto 540 mg 1 tablet daily . Magnesium 400 mg 1 tablet daily . Zinc daily or multivitamin? - how much? . Chorium? . L-caritine 500 mg 1 tablet daily? . Coenzyme Q10 1 capsule dauly - how much? . Calcium daily? . Vitamin D3 1000 units daily . Vitamin C 1000 mg 1 tablet daily . L-arginine 500 mg 1 capsule daily . Vitamin B complex daily . Biotin 10,000 units daily  We discussed:  ***  Plan  Continue {CHL HP Upstream Pharmacy UYQIH:[id number]}  Vaccines   Reviewed and discussed patient's vaccination history.    Immunization History  Administered Date(s) Administered  . Influenza Inj Mdck Quad Pf 07/01/2015, 04/10/2019  . Influenza, High Dose Seasonal PF 04/08/2018, 04/09/2019  . Influenza,trivalent, recombinat, inj, PF 02/24/2020  . Influenza-Unspecified 02/11/2020  . PFIZER SARS-COV-2 Vaccination 08/05/2019, 08/26/2019, 03/26/2020  . Pneumococcal Conjugate-13 05/31/2020  . Pneumococcal Polysaccharide-23 08/04/2015  . Tdap 04/05/2012    Plan  Recommended patient receive shingles vaccine at pharmacy.   Medication Management   Patient's preferred pharmacy is:  [id number] [place], [place] - [address] AT [facility] OF [address] & [address] [address] [facility] [address] [place] [address] Phone: [phone] Fax: [phone]  Uses pill box? {Yes or If no, why not?:20788} Pt endorses ***% compliance  We discussed: {Pharmacy options:24294}  Plan  {US Pharmacy KGUR:42706}   Follow up: *** month phone visit  Gaylord Shih, PharmD [facility] Clinical Pharmacist [facility] [place] [phone]

## 2020-06-25 NOTE — Patient Instructions (Addendum)
Hi,  It was great to meet you in person! I loved getting to meet you and want to encourage you to continue to take care of yourself by taking your medications properly. If you aren't already, go ahead and check your blood pressure along with alternating times with checking your blood sugars and we can follow up with these at each visit. Also please reach out to me if you have any questions about the patient assistance applications I provided you with!  Best, Maddie  Gaylord Shih, PharmD [facility] Clinical Pharmacist [facility] [place] [phone]  Visit Information  Goals Addressed            This Visit's Progress   . Pharmacy care plan       CARE PLAN ENTRY (see longitudinal plan of care for additional care plan information)  Current Barriers:  . Chronic Disease Management support, education, and care coordination needs related to Hyperlipidemia, Diabetes, Atrial Fibrillation, and Heart Failure   Afib BP Readings from Last 3 Encounters:  07/05/20 110/62  06/16/20 100/66  05/31/20 130/80   . Pharmacist Clinical Goal(s): o Over the next 90 days, patient will work with PharmD and providers to maintain BP goal <140/90 and HR < 110 beats per minute . Current regimen:  . Eliquis 5 mg 1 tablet twice daily . Dofetilide 125 mcg 1 capsule twice daily . Interventions: o Discussed monitoring for signs of bleeding such as unexplained and excessive bleeding from a cut or injury, easy or excessive bruising, blood in urine or stools, and nosebleeds without a known cause o Discussed requirements for patient assistance application for Eliquis . Patient self care activities - Over the next 90 days, patient will: o Check BP and HR weekly, document, and provide at future appointments o Ensure daily salt intake < 2300 mg/day  Hyperlipidemia No results found for: LDLCALC, LDLDIRECT . Pharmacist Clinical Goal(s): o Over the next 90 days, patient will work with PharmD and  providers to achieve LDL goal < 70 and TG < 150 . Current regimen:  . Atorvastatin 20 mg 1 tablet daily . Fish oil 1000 mg 1 tablet daily . Interventions: o Discussed lowering cholesterol through diet by: Marland Kitchen Limiting foods with cholesterol such as liver and other organ meats, egg yolks, shrimp, and whole milk dairy products . Avoiding saturated fats and trans fats and incorporating healthier fats, such as lean meat, nuts, and unsaturated oils like canola and olive oils . Eating foods with soluble fiber such as whole-grain cereals such as oatmeal and oat bran, fruits such as apples, bananas, oranges, pears, and prunes, legumes such as kidney beans, lentils, chick peas, black-eyed peas, and lima beans, and green leafy vegetables . Limiting alcohol intake o Discussed the use of fish oil with elevated triglycerides and the recommended dose of 2 to 4 g in order to see a benefit . Patient self care activities - Over the next 90 days, patient will: o Continue atorvastatin and stop taking fish oil daily depending on triglycerides  Diabetes Lab Results  Component Value Date/Time   HGBA1C 6.3 (H) 03/30/2020 02:43 PM   . Pharmacist Clinical Goal(s): o Over the next 90 days, patient will work with PharmD and providers to maintain A1c goal <7% . Current regimen:  . Metformin 500 mg 1 tablet in AM in 3 tablets with dinner . Farxiga 10 mg 1 tablet daily . Interventions: o Discussed following the healthy plate method which includes: . Fill half of your plate with nonstarchy vegetables,  such as spinach, broccoli, carrots and tomatoes. Lenox Ahr a quarter of your plate with a protein, such as tuna, lean pork or chicken. Lenox Ahr the last quarter with a whole-grain item, such as brown rice, or a starchy vegetable, such as green peas or potatoes. . Include "good" fats such as nuts or avocados in small amounts. o Educated on mechanism of action of Rybelsus and proper administration . Patient self care activities  - Over the next 90 days, patient will: o Check blood sugar once daily, document, and provide at future appointments o Contact provider with any episodes of hypoglycemia  Heart failure . Pharmacist Clinical Goal(s) o Over the next 90 days, patient will work with PharmD and providers to manage symptoms of heart failure and improve heart muscle function . Current regimen:  . Carvedilol 6.25 mg 1 tablet twice daily . Farxiga 10 mg 1 tablet daily . Spironolactone 25 mg 1 tablet daily . Potassium chloride 20 mEq 2 tablets daily . Torsemide 20 mg 3 tablets twice daily . Interventions: o Discussed weighing daily and monitoring sodium intake . Patient self care activities - Over the next 90 days, patient will: o Continue current medications  Medication management . Pharmacist Clinical Goal(s): o Over the next 90 days, patient will work with PharmD and providers to maintain optimal medication adherence . Current pharmacy: [facility] . Interventions o Comprehensive medication review performed. o Continue current medication management strategy . Patient self care activities - Over the next 90 days, patient will: o Take medications as prescribed o Report any questions or concerns to PharmD and/or provider(s)  Initial goal documentation        Mr. Febles was given information about Chronic Care Management services today including:  1. CCM service includes personalized support from designated clinical staff supervised by his physician, including individualized plan of care and coordination with other care providers 2. 24/7 contact phone numbers for assistance for urgent and routine care needs. 3. Standard insurance, coinsurance, copays and deductibles apply for chronic care management only during months in which we provide at least 20 minutes of these services. Most insurances cover these services at 100%, however patients may be responsible for any copay, coinsurance and/or deductible if  applicable. This service may help you avoid the need for more expensive face-to-face services. 4. Only one practitioner may furnish and bill the service in a calendar month. 5. The patient may stop CCM services at any time (effective at the end of the month) by phone call to the office staff.  Patient agreed to services and verbal consent obtained.   The patient verbalized understanding of instructions, educational materials, and care plan provided today and agreed to receive a mailed copy of patient instructions, educational materials, and care plan.  Telephone follow up appointment with pharmacy team member scheduled for: 3 months  Verner Chol, [facility]

## 2020-07-01 ENCOUNTER — Telehealth: Payer: Self-pay | Admitting: Internal Medicine

## 2020-07-01 ENCOUNTER — Telehealth: Payer: Self-pay

## 2020-07-01 NOTE — Telephone Encounter (Signed)
Attempted call patient twice to discuss LFLSAS and Stress Echo vs Heart Catheterization and to give our apologies for the delay in his care.  Patient was not able to understand the conversation despite using to different phones.  We will discuss his care on 07/12/20:  We plan to pursue heart catheterization as opposed to stress echo given inability to safely get test done at this time.  Christell Constant, MD

## 2020-07-01 NOTE — Telephone Encounter (Signed)
Spoke with our Echo scheduler who informed us that unfortunately, we cannot do the stress echo here at [facility], and this will have to be done at the hospital. This was confirmed by Echo Supervisor Chanetta Marshall, who is currently forwarding the pts chart to our Medical Director of the echo lab, to review if this pt could possibly be a candidate to have this done here in our office.  In the meantime, Misty Stanley has sent a message to Lupita Leash, to reach out to the pt and have this scheduled at the hospital. Lupita Leash will be calling the pt to schedule.  Per our Echo Scheduler, the hospital is aware of pt needing to be scheduled, but staffing is an issue at this time, for they need a PA present to perform.  Per [employer], when staffing is updated, pt will be scheduled shortly thereafter.   If Medical Director of echo lab approves scheduling of this test here at [facility], pt will be scheduled accordingly.  Will update Dr. Izora Ribas about this, as a general FYI.  Pt will be seeing Dr. Izora Ribas as planned for 07/12/20.

## 2020-07-01 NOTE — Telephone Encounter (Signed)
I have forwarded the patient's chart to the Medical Director of the echo lab, Dr. Jacques Navy, for review.

## 2020-07-01 NOTE — Telephone Encounter (Signed)
Updated plan in pt appt notes, as indicated in this note per Dr. Izora Ribas.

## 2020-07-01 NOTE — Telephone Encounter (Signed)
Will close this encounter... see previous open phone note.

## 2020-07-01 NOTE — Telephone Encounter (Addendum)
I spoke with Sharon/ [facility] and she reports that the pt has not been able to get his Dobutamine Stress Echo scheduled at the hospital due to staffing and the need for a PA to be available for the test.   I spoke with Misty Stanley at our office and she was able to schedule the pt for 07/21/20 at 1:30 pm.   He will have his COVID test 07/17/20 at 10:20 am to ensure we receive the results in time for the test.   He will need to be NPO the day of the procedure except water up until 3 hours prior to his arrivla.   No caffeine/ Decaff 12 hours prior.    Thanks so much for your patience: I'm currently on inpatient service. Wanted to close the loop: Indication for stress echo is for Aortic Stenosis, so the techs should focus on the aortic valve. We can hold his coreg the day prior to the procedure; and we can reschedule him afterwards so I can discuss the test results  We will do our best to help resolve his cardiac concerns.    Left message for the pt to call us back to give him this information.   Attestation was done for this procedure 05/20/20.

## 2020-07-01 NOTE — Telephone Encounter (Signed)
Telephone encounter from My Chart message.:   Left message for the pt to call us back to give him this information.   Attestation was done for this procedure 05/20/20.    I spoke with Sharon/ [facility] and she reports that the pt has not been able to get his Dobutamine Stress Echo scheduled at the hospital due to staffing and the need for a PA to be available for the test.   I spoke with Misty Stanley at our office and she was able to schedule the pt for 07/21/20 at 1:30 pm.   He will have his COVID test 07/17/20 at 10:20 am to ensure we receive the results in time for the test.   He will need to be NPO the day of the procedure except water up until 3 hours prior to his arrivla.   No caffeine/ Decaff 12 hours prior.    Thanks so much for your patience: I'm currently on inpatient service. Wanted to close the loop: Indication for stress echo is for Aortic Stenosis, so the techs should focus on the aortic valve. We can hold his coreg the day prior to the procedure; and we can reschedule him afterwards so I can discuss the test results  We will do our best to help resolve his cardiac concerns.

## 2020-07-01 NOTE — Telephone Encounter (Signed)
Patient is returning Auriella Wieand's call. Please advise.      Documentation    Buening, Benigno Check "hart" [phone]  Rafael Bihari      Waiting for Erie Noe to review chart to be sure appropriate the pt have his Dobutamine Stress Echo done here at our office.  Misty Stanley to let triage know today prior to calling the pt back.

## 2020-07-01 NOTE — Telephone Encounter (Signed)
Conversation (Newest Message First)  Me     07/01/20 4:07 PM Note Updated plan in pt appt notes, as indicated in this note per Dr. Izora Ribas.      Christell Constant, MD to Me   [facility]   07/01/20 3:46 PM He is very hard of hearing and could not hear what I was saying.    Christell Constant, MD   [facility]  07/01/20 3:46 PM Note Attempted call patient twice to discuss LFLSAS and Stress Echo vs Heart Catheterization and to give our apologies for the delay in his care.  Patient was not able to understand the conversation despite using to different phones.  We will discuss his care on 07/12/20:  We plan to pursue heart catheterization as opposed to stress echo given inability to safely get test done at this time.  Christell Constant, MD

## 2020-07-01 NOTE — Telephone Encounter (Signed)
Patient is returning Ann's call. Please advise. 

## 2020-07-05 ENCOUNTER — Encounter: Payer: Self-pay | Admitting: Pulmonary Disease

## 2020-07-05 ENCOUNTER — Ambulatory Visit (INDEPENDENT_AMBULATORY_CARE_PROVIDER_SITE_OTHER): Payer: Medicare Other | Admitting: Pulmonary Disease

## 2020-07-05 ENCOUNTER — Other Ambulatory Visit: Payer: Self-pay

## 2020-07-05 VITALS — BP 110/62 | HR 75 | Temp 97.8°F | Ht 68.5 in | Wt 231.8 lb

## 2020-07-05 DIAGNOSIS — I5020 Unspecified systolic (congestive) heart failure: Secondary | ICD-10-CM | POA: Diagnosis not present

## 2020-07-05 DIAGNOSIS — G4733 Obstructive sleep apnea (adult) (pediatric): Secondary | ICD-10-CM

## 2020-07-05 DIAGNOSIS — J4520 Mild intermittent asthma, uncomplicated: Secondary | ICD-10-CM | POA: Diagnosis not present

## 2020-07-05 NOTE — Progress Notes (Signed)
Synopsis: Referred in January 2022 for Obstructive Sleep Apnea  Subjective:   PATIENT ID: Chad Wilkerson GENDER: male DOB: December 23, 1945, MRN: [id number]   HPI  Chief Complaint  Patient presents with  . Consult    SOB   Chad Wilkerson is a 75 year old male, former smoker with obstructive sleep apnea on bipap, reactive airways disease, and heart failure reduced EF who is referred to pulmonary clinic to establish care.   He has recently moved to [place] from [place], [place]. He was followed by Debbra Riding, NP at [facility]. He is using bipap with a nasal mask consistently at night. He uses symbicort 160-4.62mcg daily.   He does report some progressive exertional dyspnea over the past year. He also reports he has not been as active due to the pandemic and not going to the gym. He is working on weight loss and previously weighed around 280lbs.  Past Medical History:  Diagnosis Date  . Adjustment disorder   . Atrial fibrillation (HCC)   . Benign colon polyp   . CHF (congestive heart failure) (HCC)   . Diabetes mellitus without complication (HCC)   . Diverticulitis   . Essential hypertension   . Heart disease   . Hypogonadism male   . Sleep apnea   . Urine incontinence   . Vitamin D deficiency      Family History  Problem Relation Age of Onset  . Heart attack Mother   . High Cholesterol Mother   . Asthma Father   . Kidney disease Sister   . Stroke Maternal Grandfather   . Heart attack Paternal Grandfather      Social History   Socioeconomic History  . Marital status: Widowed    Spouse name: Not on file  . Number of children: Not on file  . Years of education: Not on file  . Highest education level: Not on file  Occupational History  . Not on file  Tobacco Use  . Smoking status: Former Games developer  . Smokeless tobacco: Never Used  Substance and Sexual Activity  . Alcohol use: Not Currently  . Drug use: Not Currently  . Sexual activity: Not on file  Other  Topics Concern  . Not on file  Social History Narrative  . Not on file   Social Determinants of Health   Financial Resource Strain: Medium Risk  . Difficulty of Paying Living Expenses: Somewhat hard  Food Insecurity: No Food Insecurity  . Worried About Programme researcher, broadcasting/film/video in the Last Year: Never true  . Ran Out of Food in the Last Year: Never true  Transportation Needs: No Transportation Needs  . Lack of Transportation (Medical): No  . Lack of Transportation (Non-Medical): No  Physical Activity: Inactive  . Days of Exercise per Week: 0 days  . Minutes of Exercise per Session: 0 min  Stress: No Stress Concern Present  . Feeling of Stress : Not at all  Social Connections: Moderately Isolated  . Frequency of Communication with Friends and Family: Once a week  . Frequency of Social Gatherings with Friends and Family: More than three times a week  . Attends Religious Services: Never  . Active Member of Clubs or Organizations: No  . Attends Banker Meetings: Never  . Marital Status: Living with partner  Intimate Partner Violence: Not At Risk  . Fear of Current or Ex-Partner: No  . Emotionally Abused: No  . Physically Abused: No  . Sexually Abused: No  No Known Allergies   Outpatient Medications Prior to Visit  Medication Sig Dispense Refill  . Arginine (L-ARGININE-500) 500 MG CAPS Take by mouth.    . Ascorbic Acid (VITAMIN C) 1000 MG tablet Take 1,000 mg by mouth daily.    Marland Kitchen atorvastatin (LIPITOR) 20 MG tablet Take 20 mg by mouth daily.    . B Complex Vitamins (B COMPLEX PO) Take by mouth daily.    Marland Kitchen BIOTIN PO Take 10,000 Units by mouth daily.    . budesonide-formoterol (SYMBICORT) 160-4.5 MCG/ACT inhaler INL 2 PFS PO BID IN THE MORNING AND IN THE EVE    . CALCIUM PO Take by mouth daily.    . carvedilol (COREG) 6.25 MG tablet Take 6.25 mg by mouth 2 (two) times daily.    . cholecalciferol (VITAMIN D3) 25 MCG (1000 UNIT) tablet Take 1,000 Units by mouth daily.     . Coenzyme Q10 (COQ10) 100 MG CAPS Take by mouth daily.    . dapagliflozin propanediol (FARXIGA) 10 MG TABS tablet Take 1 tablet (10 mg total) by mouth daily before breakfast. 30 tablet 11  . dofetilide (TIKOSYN) 125 MCG capsule Take 125 mcg by mouth every 12 (twelve) hours.    Marland Kitchen ELIQUIS 5 MG TABS tablet Take 5 mg by mouth 2 (two) times daily.    Marland Kitchen escitalopram (LEXAPRO) 10 MG tablet Take 1 tablet (10 mg total) by mouth daily. 90 tablet 0  . Magnesium 400 MG TABS Take 400 mg by mouth daily. 60 tablet 3  . metFORMIN (GLUCOPHAGE) 500 MG tablet Take 500 mg by mouth. Taking 1 tablet with breakfast and 3 tablets with dinner    . Multiple Vitamins-Minerals (ZINC PO) Take by mouth daily.    . Omega-3 Fatty Acids (FISH OIL PO) Take 1,000 mg by mouth.    Letta Pate ULTRA test strip USE TO TEST BLOOD SUGAR 2 TIMES DAILY    . OVER THE COUNTER MEDICATION Chorium    . OVER THE COUNTER MEDICATION L-caritine 500mg     . Potassium Chloride ER 20 MEQ TBCR Take 2 tablets by mouth daily. 60 tablet 3  . spironolactone (ALDACTONE) 25 MG tablet Take 25 mg by mouth daily.    torsemide (DEMADEX) 20 MG tablet Take 3 tablets (60 mg total) by mouth 2 (two) times daily. 540 tablet 1  . vitamin E 180 MG (400 UNITS) capsule Take 400 Units by mouth daily.    Marland Kitchen Zn-Pyg Afri-Nettle-Saw Palmet (SAW PALMETTO COMPLEX PO) Take 540 mg by mouth daily.     No facility-administered medications prior to visit.    Review of Systems  Constitutional: Negative for chills, diaphoresis, fever, malaise/fatigue and weight loss.  HENT: Negative for congestion, sinus pain and sore throat.   Eyes: Negative.   Respiratory: Positive for shortness of breath. Negative for cough, hemoptysis, sputum production and wheezing.   Cardiovascular: Negative for chest pain, palpitations, orthopnea, claudication, leg swelling and PND.  Gastrointestinal: Negative for heartburn, nausea and vomiting.  Genitourinary: Negative.   Musculoskeletal: Negative.    Skin: Negative for rash.  Neurological: Negative.   Endo/Heme/Allergies: Negative.   Psychiatric/Behavioral: Negative.    Objective:   Vitals:   07/05/20 1408  BP: 110/62  Pulse: 75  Temp: 97.8 F (36.6 C)  TempSrc: Oral  SpO2: 97%  Weight: 231 lb 12.8 oz (105.1 kg)  Height: 5' 8.5" (1.74 m)     Physical Exam Constitutional:      General: He is not in acute distress.  Appearance: He is obese. He is not ill-appearing.  HENT:     Head: Normocephalic and atraumatic.     Nose:     Comments: deferred due to covid 19 mask requirement    Mouth/Throat:     Comments: deferred due to covid 19 mask requirement Eyes:     General: No scleral icterus.    Conjunctiva/sclera: Conjunctivae normal.     Pupils: Pupils are equal, round, and reactive to light.  Cardiovascular:     Rate and Rhythm: Normal rate and regular rhythm.     Pulses: Normal pulses.     Heart sounds: Normal heart sounds. No murmur heard.   Pulmonary:     Effort: Pulmonary effort is normal. No respiratory distress.     Breath sounds: Normal breath sounds. No wheezing, rhonchi or rales.  Musculoskeletal:     Right lower leg: No edema.     Left lower leg: No edema.  Skin:    General: Skin is warm and dry.  Neurological:     General: No focal deficit present.     Mental Status: He is alert.  Psychiatric:        Mood and Affect: Mood normal.        Behavior: Behavior normal.        Thought Content: Thought content normal.        Judgment: Judgment normal.     CBC    Component Value Date/Time   WBC 9.0 03/30/2020 1443   RBC 4.84 03/30/2020 1443   HGB 15.0 03/30/2020 1443   HCT 44.3 03/30/2020 1443   PLT 323 03/30/2020 1443   MCV 91.5 03/30/2020 1443   MCH 31.0 03/30/2020 1443   MCHC 33.9 03/30/2020 1443   RDW 14.7 03/30/2020 1443   LYMPHSABS 1,296 03/30/2020 1443   EOSABS 234 03/30/2020 1443   BASOSABS 54 03/30/2020 1443   BMP Latest Ref Rng & Units 05/10/2020 04/13/2020 04/01/2020  Glucose  65 - 99 mg/dL 939(Q) 300(P) 233(A)  BUN 8 - 27 mg/dL 26 24 20   Creatinine 0.76 - 1.27 mg/dL 0.76 2.26  BUN/Creat Ratio 10 - 24 27(H) 23 22  Sodium 134 - 144 mmol/L 136 137 140  Potassium 3.5 - 5.2 mmol/L 3.6 3.7 4.2  Chloride 96 - 106 mmol/L 92(L) 92(L) 100  CO2 20 - 29 mmol/L 25 28 24   Calcium 8.6 - 10.2 mg/dL 9.7 9.7 9.3   Chest imaging: CXR 2002 HEART AND MEDIASTINUM UNREMARKABLE. PERIHILAR MARKINGS ARE MILDLY ACCENTUATED WITH OTHERWISE CLEAR  LUNGS. BONES UNREMARKABLE.  PFT: No flowsheet data found.  Echo: 04/23/20 1. Left ventricular ejection fraction, by estimation, is 20 to 25%. The  left ventricle has severely decreased function. The left ventricle  demonstrates global hypokinesis. The left ventricular internal cavity size  was severely dilated. Indeterminate  diastolic filling due to E-A fusion.  2. Right ventricular systolic function is moderately reduced. The right  ventricular size is normal. Mildly increased right ventricular wall  thickness. There is moderately elevated pulmonary artery systolic  pressure.  3. AV is thickened, calcified with restricted motion. By 2D imaging  appears swverely restricted. Peak and mean gradients through the valve are  24 and 15 mm Hg respectively. Gradients probably relfect depressed LVEF.  Dimensionless index is 0.36  consistent with moderate AS. Consider TEE to furhter define .  4. Left atrial size was moderately dilated.  5. Mild mitral valve regurgitation. Moderate mitral annular  calcification.  6. There is mild dilatation of the  ascending aorta, measuring 42 mm.  7. The inferior vena cava is dilated in size with <50% respiratory  variability, suggesting right atrial pressure of 15 mmHg.    Assessment & Plan:   Obstructive sleep apnea  Heart failure with reduced ejection fraction (HCC)  Mild intermittent reactive airway disease without complication  Discussion: Chad Wilkerson is a 75 year old male,  former smoker with obstructive sleep apnea on bipap, reactive airways disease, and heart failure reduced EF who is referred to pulmonary clinic to establish care.  We will obtain records from his previous provider for further review. He is to continue on bipap and will obtain data from his machine to review his current settings and response.   We will try to qualify him for the symbicort assistance program as he can't afford this inhaler otherwise for his reactive airways disease.   Encouraged him to continue with further weight loss and to continue to increase his physical activity level.   Follow up in 4 months.   Melody Comas, MD [facility] Office: [phone]    Current Outpatient Medications:  .  Arginine (L-ARGININE-500) 500 MG CAPS, Take by mouth., Disp: , Rfl:  .  Ascorbic Acid (VITAMIN C) 1000 MG tablet, Take 1,000 mg by mouth daily., Disp: , Rfl:  .  atorvastatin (LIPITOR) 20 MG tablet, Take 20 mg by mouth daily., Disp: , Rfl:  .  B Complex Vitamins (B COMPLEX PO), Take by mouth daily., Disp: , Rfl:  .  BIOTIN PO, Take 10,000 Units by mouth daily., Disp: , Rfl:  .  budesonide-formoterol (SYMBICORT) 160-4.5 MCG/ACT inhaler, INL 2 PFS PO BID IN THE MORNING AND IN THE EVE, Disp: , Rfl:  .  CALCIUM PO, Take by mouth daily., Disp: , Rfl:  .  carvedilol (COREG) 6.25 MG tablet, Take 6.25 mg by mouth 2 (two) times daily., Disp: , Rfl:  .  cholecalciferol (VITAMIN D3) 25 MCG (1000 UNIT) tablet, Take 1,000 Units by mouth daily., Disp: , Rfl:  .  Coenzyme Q10 (COQ10) 100 MG CAPS, Take by mouth daily., Disp: , Rfl:  .  dapagliflozin propanediol (FARXIGA) 10 MG TABS tablet, Take 1 tablet (10 mg total) by mouth daily before breakfast., Disp: 30 tablet, Rfl: 11 .  dofetilide (TIKOSYN) 125 MCG capsule, Take 125 mcg by mouth every 12 (twelve) hours., Disp: , Rfl:  .  ELIQUIS 5 MG TABS tablet, Take 5 mg by mouth 2 (two) times daily., Disp: , Rfl:  .  escitalopram  (LEXAPRO) 10 MG tablet, Take 1 tablet (10 mg total) by mouth daily., Disp: 90 tablet, Rfl: 0 .  Magnesium 400 MG TABS, Take 400 mg by mouth daily., Disp: 60 tablet, Rfl: 3 .  metFORMIN (GLUCOPHAGE) 500 MG tablet, Take 500 mg by mouth. Taking 1 tablet with breakfast and 3 tablets with dinner, Disp: , Rfl:  .  Multiple Vitamins-Minerals (ZINC PO), Take by mouth daily., Disp: , Rfl:  .  Omega-3 Fatty Acids (FISH OIL PO), Take 1,000 mg by mouth., Disp: , Rfl:  .  ONETOUCH ULTRA test strip, USE TO TEST BLOOD SUGAR 2 TIMES DAILY, Disp: , Rfl:  .  OVER THE COUNTER MEDICATION, Chorium, Disp: , Rfl:  .  OVER THE COUNTER MEDICATION, L-caritine 500mg , Disp: , Rfl:  .  Potassium Chloride ER 20 MEQ TBCR, Take 2 tablets by mouth daily., Disp: 60 tablet, Rfl: 3 .  spironolactone (ALDACTONE) 25 MG tablet, Take 25 mg by mouth daily., Disp: , Rfl:  .  torsemide (DEMADEX) 20 MG tablet, Take 3 tablets (60 mg total) by mouth 2 (two) times daily., Disp: 540 tablet, Rfl: 1 .  vitamin E 180 MG (400 UNITS) capsule, Take 400 Units by mouth daily., Disp: , Rfl:  .  Zn-Pyg Afri-Nettle-Saw Palmet (SAW PALMETTO COMPLEX PO), Take 540 mg by mouth daily., Disp: , Rfl:  .  Semaglutide (RYBELSUS) 3 MG TABS, Take 1 each by mouth daily. [id number] and exp: 07/2020., Disp: 30 tablet, Rfl: 0

## 2020-07-05 NOTE — Patient Instructions (Addendum)
We will obtain your Bipap machine data in order to determine your settings are correct  We will work on getting you re-qualified for Symbicort assistance  Please send the information to obtain records from Debbra Riding at [facility]

## 2020-07-06 ENCOUNTER — Other Ambulatory Visit: Payer: Medicare Other

## 2020-07-08 ENCOUNTER — Telehealth: Payer: Self-pay | Admitting: *Deleted

## 2020-07-08 MED ORDER — RYBELSUS 3 MG PO TABS: 1.0000 | ORAL_TABLET | Freq: Every day | ORAL | 0 refills | Status: DC

## 2020-07-08 NOTE — Telephone Encounter (Signed)
-----   Message from Verner Chol, [employer] sent at 07/07/2020  5:30 PM EST ----- Regarding: Rybelsus sample Hi,  Can you please put in a sample for [name], one of Cory's patients? I gave him 1 box (30 tabs) of Rybelsus 3 mg. The lot was [id number] and exp: 07/2020.  Thank you, Maddie

## 2020-07-12 ENCOUNTER — Encounter: Payer: Self-pay | Admitting: Internal Medicine

## 2020-07-12 ENCOUNTER — Other Ambulatory Visit: Payer: Self-pay

## 2020-07-12 ENCOUNTER — Ambulatory Visit (INDEPENDENT_AMBULATORY_CARE_PROVIDER_SITE_OTHER): Payer: Medicare Other | Admitting: Internal Medicine

## 2020-07-12 VITALS — BP 102/60 | HR 83 | Ht 68.0 in | Wt 232.0 lb

## 2020-07-12 DIAGNOSIS — I5020 Unspecified systolic (congestive) heart failure: Secondary | ICD-10-CM | POA: Diagnosis not present

## 2020-07-12 DIAGNOSIS — I739 Peripheral vascular disease, unspecified: Secondary | ICD-10-CM

## 2020-07-12 DIAGNOSIS — I480 Paroxysmal atrial fibrillation: Secondary | ICD-10-CM

## 2020-07-12 DIAGNOSIS — I10 Essential (primary) hypertension: Secondary | ICD-10-CM

## 2020-07-12 DIAGNOSIS — I350 Nonrheumatic aortic (valve) stenosis: Secondary | ICD-10-CM

## 2020-07-12 DIAGNOSIS — E119 Type 2 diabetes mellitus without complications: Secondary | ICD-10-CM

## 2020-07-12 DIAGNOSIS — I340 Nonrheumatic mitral (valve) insufficiency: Secondary | ICD-10-CM | POA: Diagnosis not present

## 2020-07-12 NOTE — H&P (View-Only) (Signed)
Cardiology Office Note:    Date:  07/12/2020   ID:  Chad Wilkerson, DOB [birth year], MRN [id number]  PCP:  [name], NP  [facility] Cardiologist:  Christell Constant, MD  [facility] Electrophysiologist:  Steffanie Dunn MD  Referring MD: [name], NP   CC: Follow up for LFLGAS assessment  History of Present Illness:    Chad Wilkerson is a 75 y.o. male with a hx of Diabetes with HTN, HFrEF 25-30% with Medtronic ICD, 1st HB, mild to moderate AS, Mild MR, Atrial fibrillation on Dofetilide, eliquis, and coreg;  OSA on BiPAP seen 04/01/20.  In interim from that visit, established care with EP [name]). Started Toresmide 60 mg BID at last visit.  In interim had planned Low dose dobutamine stress echo for LFLGAS; we were unable to safely staff this procedure.  COPD issues because he lost his symbicort and is having issues getting it working with his pulmonologist.  Patient notes that he is doing better with he breathing  No SOB, but has some DOE at the grocery storre.  Since last visit notes no palpitations changes.  Relevant interval testing or therapy include no long on symbicort.  There are no interval hospital/ED visit.    No chest pain or pressure .  No SOB but DOE at the grocery store. No PND/Orthopnea.  No weight gain or leg swelling.  No palpitations or syncope.  Past Medical History:  Diagnosis Date  . Adjustment disorder   . Atrial fibrillation (HCC)   . Benign colon polyp   . CHF (congestive heart failure) (HCC)   . Diabetes mellitus without complication (HCC)   . Diverticulitis   . Essential hypertension   . Heart disease   . Hypogonadism male   . Sleep apnea   . Urine incontinence   . Vitamin D deficiency     Past Surgical History:  Procedure Laterality Date  . CHOLECYSTECTOMY    . TONSILLECTOMY      Current Medications: Current Meds  Medication Sig  . Arginine (L-ARGININE-500) 500 MG CAPS Take 500 mg by mouth daily at 12 noon.  .  Ascorbic Acid (VITAMIN C) 1000 MG tablet Take 1,000 mg by mouth daily.  Marland Kitchen atorvastatin (LIPITOR) 20 MG tablet Take 20 mg by mouth daily.  . B Complex Vitamins (B COMPLEX PO) Take by mouth daily.  Marland Kitchen BIOTIN PO Take 10,000 Units by mouth daily.  . budesonide-formoterol (SYMBICORT) 160-4.5 MCG/ACT inhaler INL 2 PFS PO BID IN THE MORNING AND IN THE EVE  . CALCIUM PO Take by mouth daily.  . carvedilol (COREG) 6.25 MG tablet Take 6.25 mg by mouth 2 (two) times daily.  . cholecalciferol (VITAMIN D3) 25 MCG (1000 UNIT) tablet Take 1,000 Units by mouth daily.  . Coenzyme Q10 (COQ10) 100 MG CAPS Take by mouth daily.  . dapagliflozin propanediol (FARXIGA) 10 MG TABS tablet Take 1 tablet (10 mg total) by mouth daily before breakfast.  . dofetilide (TIKOSYN) 125 MCG capsule Take 125 mcg by mouth every 12 (twelve) hours.  Marland Kitchen ELIQUIS 5 MG TABS tablet Take 5 mg by mouth 2 (two) times daily.  Marland Kitchen escitalopram (LEXAPRO) 10 MG tablet Take 1 tablet (10 mg total) by mouth daily.  . Magnesium 400 MG TABS Take 400 mg by mouth daily.  . metFORMIN (GLUCOPHAGE) 500 MG tablet Take 500 mg by mouth. Taking 1 tablet with breakfast and 3 tablets with dinner  . Multiple Vitamins-Minerals (ZINC PO) Take by mouth daily.  Marland Kitchen  Omega-3 Fatty Acids (FISH OIL PO) Take 1,000 mg by mouth.  . ONETOUCH ULTRA test strip USE TO TEST BLOOD SUGAR 2 TIMES DAILY  . OVER THE COUNTER MEDICATION Chorium  . OVER THE COUNTER MEDICATION L-caritine 500mg  . Potassium Chloride ER 20 MEQ TBCR Take 2 tablets by mouth daily.  . Semaglutide (RYBELSUS) 3 MG TABS Take 1 each by mouth daily. L3019B and exp: 07/2020.  . spironolactone (ALDACTONE) 25 MG tablet Take 25 mg by mouth daily.  . torsemide (DEMADEX) 20 MG tablet Take 3 tablets (60 mg total) by mouth 2 (two) times daily.  . vitamin E 180 MG (400 UNITS) capsule Take 400 Units by mouth daily.  . Zn-Pyg Afri-Nettle-Saw Palmet (SAW PALMETTO COMPLEX PO) Take 540 mg by mouth daily.    Allergies:   Patient  has no known allergies.   Social History   Socioeconomic History  . Marital status: Widowed    Spouse name: Not on file  . Number of children: Not on file  . Years of education: Not on file  . Highest education level: Not on file  Occupational History  . Not on file  Tobacco Use  . Smoking status: Former Smoker  . Smokeless tobacco: Never Used  Substance and Sexual Activity  . Alcohol use: Not Currently  . Drug use: Not Currently  . Sexual activity: Not on file  Other Topics Concern  . Not on file  Social History Narrative  . Not on file   Social Determinants of Health   Financial Resource Strain: Medium Risk  . Difficulty of Paying Living Expenses: Somewhat hard  Food Insecurity: No Food Insecurity  . Worried About Running Out of Food in the Last Year: Never true  . Ran Out of Food in the Last Year: Never true  Transportation Needs: No Transportation Needs  . Lack of Transportation (Medical): No  . Lack of Transportation (Non-Medical): No  Physical Activity: Inactive  . Days of Exercise per Week: 0 days  . Minutes of Exercise per Session: 0 min  Stress: No Stress Concern Present  . Feeling of Stress : Not at all  Social Connections: Moderately Isolated  . Frequency of Communication with Friends and Family: Once a week  . Frequency of Social Gatherings with Friends and Family: More than three times a week  . Attends Religious Services: Never  . Active Member of Clubs or Organizations: No  . Attends Club or Organization Meetings: Never  . Marital Status: Living with partner    Family History: The patient's family history includes Asthma in his father; Heart attack in his mother and paternal grandfather; High Cholesterol in his mother; Kidney disease in his sister; Stroke in his maternal grandfather.  ROS:   Please see the history of present illness.    All other systems reviewed and are negative.  EKGs/Labs/Other Studies Reviewed:    The following studies were  reviewed today:  EKG:   07/12/20: SR rate 83 with 1st HB PR prolongation, IVCD, QRS 130, Frequent PVCs QTc 500 04/02/20: SR with 1st HB PR of 300 anterior infarct pattern and QTc 470 05/10/20 SR 1st Hb 300, occasional PVC, QTc 487 [place]  Recent Labs: 03/30/2020: ALT 23; Hemoglobin 15.0; Platelets 323 04/01/2020: NT-Pro BNP 5,001 05/10/2020: BUN 26; Creatinine, Ser 0.96; Magnesium 2.4; Potassium 3.6; Sodium 136  Risk Assessment/Calculations:     CHA2DS2-VASc Score = 3  This indicates a 3.2% annual risk of stroke. The patient's score is based upon: CHF History: Yes   HTN History: Yes Diabetes History: No Stroke History: No Vascular Disease History: No Age Score: 1 Gender Score: 0     Physical Exam:    VS:  BP 102/60   Pulse 83   Ht 5\' 8"  (1.727 m)   Wt 232 lb (105.2 kg)   SpO2 92%   BMI 35.28 kg/m     Wt Readings from Last 3 Encounters:  07/12/20 232 lb (105.2 kg)  07/05/20 231 lb 12.8 oz (105.1 kg)  06/16/20 233 lb (105.7 kg)    GEN: Obese well developed in no acute distress HEENT: Normal NECK: No JVD thought difficult exam; No carotid bruits LYMPHATICS: No lymphadenopathy CARDIAC: RRR, I/VI systolic flow murmur  rubs, gallops RESPIRATORY:  Clear to auscultation without rales, wheezing or rhonchi  ABDOMEN: Soft, non-tender , non-distended  MUSCULOSKELETAL:  No edema No deformity  SKIN: Warm and dry NEUROLOGIC:  Alert and oriented x 3 PSYCHIATRIC:  Normal affect   ASSESSMENT:    1. Aortic valve stenosis, etiology of cardiac valve disease unspecified   2. Heart failure with reduced ejection fraction (HCC)   3. Moderate aortic stenosis   4. Mild mitral regurgitation   5. Morbid obesity (HCC)   6. Diabetes mellitus with coincident hypertension (HCC)   7. Paroxysmal atrial fibrillation (HCC)    PLAN:    In order of problems listed above:  Chronic Heart Failure Reduced EF  20-25% Low Flow Low Gradient at least Moderate AS Mild MR Diabetes with  hypertension Morbid Obesity (lost 10 lbs) - NYHA class II, StageB , euvolemic, etiology from A fib vs AS - Torsemide 60 mg PO BID - Daily weights, and fluid restriction of < 2 L  - Continue Coreg 6.25 mg BID  - No ARNI/ARB/ACEi in the setting of relatively low BP - Continue spironolactone 25 mg   - Continue Farixga 10 mg Po daily - will repeat LHC for AS - Risks and benefits of cardiac catheterization have been discussed with the patient.  These include bleeding, infection, kidney damage, stroke, heart attack, death.  The patient understands these risks and is willing to proceed.  - Has Medtronic ICD followed by EP  Paroxysmal Atrial Fibrillation CHADSVASC=3. - Continue Coreg & Tikosyn - and Eliquis - QTC today 500; will reach out to EP team  Leg Claudication - will get bilateral ABI's  2-3 months follow up unless new symptoms or abnormal test results warranting change in plan  Would be reasonable for APP Follow up  Time Spent Directly with Patient:   I have spent a total of 45 minutes with the patient reviewing  notes, imaging and discussing procedure and possible TAVR and examining the patient as well as establishing an assessment and plan that was discussed personally with the patient.  > 50% of time was spent in direct patient care.   Medication Adjustments/Labs and Tests Ordered: Current medicines are reviewed at length with the patient today.  Concerns regarding medicines are outlined above.  Orders Placed This Encounter  Procedures  . CBC  . Basic metabolic panel  . EKG 12-Lead   No orders of the defined types were placed in this encounter.   There are no Patient Instructions on file for this visit.   Signed, 08/14/20, MD  07/12/2020 4:50 PM    [facility]

## 2020-07-12 NOTE — Progress Notes (Signed)
Cardiology Office Note:    Date:  07/12/2020   ID:  Chad Wilkerson, DOB [birth year], MRN [id number]  PCP:  [name], NP  [facility] Cardiologist:  Christell Constant, MD  [facility] Electrophysiologist:  Steffanie Dunn MD  Referring MD: [name], NP   CC: Follow up for LFLGAS assessment  History of Present Illness:    Chad Wilkerson is a 75 y.o. male with a hx of Diabetes with HTN, HFrEF 25-30% with Medtronic ICD, 1st HB, mild to moderate AS, Mild MR, Atrial fibrillation on Dofetilide, eliquis, and coreg;  OSA on BiPAP seen 04/01/20.  In interim from that visit, established care with EP [name]). Started Toresmide 60 mg BID at last visit.  In interim had planned Low dose dobutamine stress echo for LFLGAS; we were unable to safely staff this procedure.  COPD issues because he lost his symbicort and is having issues getting it working with his pulmonologist.  Patient notes that he is doing better with he breathing  No SOB, but has some DOE at the grocery storre.  Since last visit notes no palpitations changes.  Relevant interval testing or therapy include no long on symbicort.  There are no interval hospital/ED visit.    No chest pain or pressure .  No SOB but DOE at the grocery store. No PND/Orthopnea.  No weight gain or leg swelling.  No palpitations or syncope.  Past Medical History:  Diagnosis Date  . Adjustment disorder   . Atrial fibrillation (HCC)   . Benign colon polyp   . CHF (congestive heart failure) (HCC)   . Diabetes mellitus without complication (HCC)   . Diverticulitis   . Essential hypertension   . Heart disease   . Hypogonadism male   . Sleep apnea   . Urine incontinence   . Vitamin D deficiency     Past Surgical History:  Procedure Laterality Date  . CHOLECYSTECTOMY    . TONSILLECTOMY      Current Medications: Current Meds  Medication Sig  . Arginine (L-ARGININE-500) 500 MG CAPS Take 500 mg by mouth daily at 12 noon.  .  Ascorbic Acid (VITAMIN C) 1000 MG tablet Take 1,000 mg by mouth daily.  Marland Kitchen atorvastatin (LIPITOR) 20 MG tablet Take 20 mg by mouth daily.  . B Complex Vitamins (B COMPLEX PO) Take by mouth daily.  Marland Kitchen BIOTIN PO Take 10,000 Units by mouth daily.  . budesonide-formoterol (SYMBICORT) 160-4.5 MCG/ACT inhaler INL 2 PFS PO BID IN THE MORNING AND IN THE EVE  . CALCIUM PO Take by mouth daily.  . carvedilol (COREG) 6.25 MG tablet Take 6.25 mg by mouth 2 (two) times daily.  . cholecalciferol (VITAMIN D3) 25 MCG (1000 UNIT) tablet Take 1,000 Units by mouth daily.  . Coenzyme Q10 (COQ10) 100 MG CAPS Take by mouth daily.  . dapagliflozin propanediol (FARXIGA) 10 MG TABS tablet Take 1 tablet (10 mg total) by mouth daily before breakfast.  . dofetilide (TIKOSYN) 125 MCG capsule Take 125 mcg by mouth every 12 (twelve) hours.  Marland Kitchen ELIQUIS 5 MG TABS tablet Take 5 mg by mouth 2 (two) times daily.  Marland Kitchen escitalopram (LEXAPRO) 10 MG tablet Take 1 tablet (10 mg total) by mouth daily.  . Magnesium 400 MG TABS Take 400 mg by mouth daily.  . metFORMIN (GLUCOPHAGE) 500 MG tablet Take 500 mg by mouth. Taking 1 tablet with breakfast and 3 tablets with dinner  . Multiple Vitamins-Minerals (ZINC PO) Take by mouth daily.  Marland Kitchen  Omega-3 Fatty Acids (FISH OIL PO) Take 1,000 mg by mouth.  Letta Pate ULTRA test strip USE TO TEST BLOOD SUGAR 2 TIMES DAILY  . OVER THE COUNTER MEDICATION Chorium  . OVER THE COUNTER MEDICATION L-caritine 500mg   . Potassium Chloride ER 20 MEQ TBCR Take 2 tablets by mouth daily.  . Semaglutide (RYBELSUS) 3 MG TABS Take 1 each by mouth daily. L3019B and exp: 07/2020.  . spironolactone (ALDACTONE) 25 MG tablet Take 25 mg by mouth daily.  08/2020 torsemide (DEMADEX) 20 MG tablet Take 3 tablets (60 mg total) by mouth 2 (two) times daily.  . vitamin E 180 MG (400 UNITS) capsule Take 400 Units by mouth daily.  Marland Kitchen Zn-Pyg Afri-Nettle-Saw Palmet (SAW PALMETTO COMPLEX PO) Take 540 mg by mouth daily.    Allergies:   Patient  has no known allergies.   Social History   Socioeconomic History  . Marital status: Widowed    Spouse name: Not on file  . Number of children: Not on file  . Years of education: Not on file  . Highest education level: Not on file  Occupational History  . Not on file  Tobacco Use  . Smoking status: Former Marland Kitchen  . Smokeless tobacco: Never Used  Substance and Sexual Activity  . Alcohol use: Not Currently  . Drug use: Not Currently  . Sexual activity: Not on file  Other Topics Concern  . Not on file  Social History Narrative  . Not on file   Social Determinants of Health   Financial Resource Strain: Medium Risk  . Difficulty of Paying Living Expenses: Somewhat hard  Food Insecurity: No Food Insecurity  . Worried About Games developer in the Last Year: Never true  . Ran Out of Food in the Last Year: Never true  Transportation Needs: No Transportation Needs  . Lack of Transportation (Medical): No  . Lack of Transportation (Non-Medical): No  Physical Activity: Inactive  . Days of Exercise per Week: 0 days  . Minutes of Exercise per Session: 0 min  Stress: No Stress Concern Present  . Feeling of Stress : Not at all  Social Connections: Moderately Isolated  . Frequency of Communication with Friends and Family: Once a week  . Frequency of Social Gatherings with Friends and Family: More than three times a week  . Attends Religious Services: Never  . Active Member of Clubs or Organizations: No  . Attends Programme researcher, broadcasting/film/video Meetings: Never  . Marital Status: Living with partner    Family History: The patient's family history includes Asthma in his father; Heart attack in his mother and paternal grandfather; High Cholesterol in his mother; Kidney disease in his sister; Stroke in his maternal grandfather.  ROS:   Please see the history of present illness.    All other systems reviewed and are negative.  EKGs/Labs/Other Studies Reviewed:    The following studies were  reviewed today:  EKG:   07/12/20: SR rate 83 with 1st HB PR prolongation, IVCD, QRS 130, Frequent PVCs QTc 500 04/02/20: SR with 1st HB PR of 300 anterior infarct pattern and QTc 470 05/10/20 SR 1st Hb 300, occasional PVC, QTc 487 [place]  Recent Labs: 03/30/2020: ALT 23; Hemoglobin 15.0; Platelets 323 04/01/2020: NT-Pro BNP 5,001 05/10/2020: BUN 26; Creatinine, Ser 0.96; Magnesium 2.4; Potassium 3.6; Sodium 136  Risk Assessment/Calculations:     CHA2DS2-VASc Score = 3  This indicates a 3.2% annual risk of stroke. The patient's score is based upon: CHF History: Yes  HTN History: Yes Diabetes History: No Stroke History: No Vascular Disease History: No Age Score: 1 Gender Score: 0     Physical Exam:    VS:  BP 102/60   Pulse 83   Ht 5\' 8"  (1.727 m)   Wt 232 lb (105.2 kg)   SpO2 92%   BMI 35.28 kg/m     Wt Readings from Last 3 Encounters:  07/12/20 232 lb (105.2 kg)  07/05/20 231 lb 12.8 oz (105.1 kg)  06/16/20 233 lb (105.7 kg)    GEN: Obese well developed in no acute distress HEENT: Normal NECK: No JVD thought difficult exam; No carotid bruits LYMPHATICS: No lymphadenopathy CARDIAC: RRR, I/VI systolic flow murmur  rubs, gallops RESPIRATORY:  Clear to auscultation without rales, wheezing or rhonchi  ABDOMEN: Soft, non-tender , non-distended  MUSCULOSKELETAL:  No edema No deformity  SKIN: Warm and dry NEUROLOGIC:  Alert and oriented x 3 PSYCHIATRIC:  Normal affect   ASSESSMENT:    1. Aortic valve stenosis, etiology of cardiac valve disease unspecified   2. Heart failure with reduced ejection fraction (HCC)   3. Moderate aortic stenosis   4. Mild mitral regurgitation   5. Morbid obesity (HCC)   6. Diabetes mellitus with coincident hypertension (HCC)   7. Paroxysmal atrial fibrillation (HCC)    PLAN:    In order of problems listed above:  Chronic Heart Failure Reduced EF  20-25% Low Flow Low Gradient at least Moderate AS Mild MR Diabetes with  hypertension Morbid Obesity (lost 10 lbs) - NYHA class II, StageB , euvolemic, etiology from A fib vs AS - Torsemide 60 mg PO BID - Daily weights, and fluid restriction of < 2 L  - Continue Coreg 6.25 mg BID  - No ARNI/ARB/ACEi in the setting of relatively low BP - Continue spironolactone 25 mg   - Continue Farixga 10 mg Po daily - will repeat LHC for AS - Risks and benefits of cardiac catheterization have been discussed with the patient.  These include bleeding, infection, kidney damage, stroke, heart attack, death.  The patient understands these risks and is willing to proceed.  - Has Medtronic ICD followed by EP  Paroxysmal Atrial Fibrillation CHADSVASC=3. - Continue Coreg & Tikosyn - and Eliquis - QTC today 500; will reach out to EP team  Leg Claudication - will get bilateral ABI's  2-3 months follow up unless new symptoms or abnormal test results warranting change in plan  Would be reasonable for APP Follow up  Time Spent Directly with Patient:   I have spent a total of 45 minutes with the patient reviewing  notes, imaging and discussing procedure and possible TAVR and examining the patient as well as establishing an assessment and plan that was discussed personally with the patient.  > 50% of time was spent in direct patient care.   Medication Adjustments/Labs and Tests Ordered: Current medicines are reviewed at length with the patient today.  Concerns regarding medicines are outlined above.  Orders Placed This Encounter  Procedures  . CBC  . Basic metabolic panel  . EKG 12-Lead   No orders of the defined types were placed in this encounter.   There are no Patient Instructions on file for this visit.   Signed, 08/14/20, MD  07/12/2020 4:50 PM    [facility]

## 2020-07-12 NOTE — Patient Instructions (Addendum)
Medication Instructions:  Your physician recommends that you continue on your current medications as directed. Please refer to the Current Medication list given to you today.  *If you need a refill on your cardiac medications before your next appointment, please call your pharmacy*   Lab Work: BMET and CBC today  If you have labs (blood work) drawn today and your tests are completely normal, you will receive your results only by: Marland Kitchen MyChart Message (if you have MyChart) OR . A paper copy in the mail If you have any lab test that is abnormal or we need to change your treatment, we will call you to review the results.   Testing/Procedures: Your physician has requested that you have a lower or upper extremity arterial duplex. This test is an ultrasound of the arteries in the legs or arms. It looks at arterial blood flow in the legs and arms. Allow one hour for Lower and Upper Arterial scans. There are no restrictions or special instructions   Your physician has requested that you have a cardiac catheterization. Cardiac catheterization is used to diagnose and/or treat various heart conditions. Doctors may recommend this procedure for a number of different reasons. The most common reason is to evaluate chest pain. Chest pain can be a symptom of coronary artery disease (CAD), and cardiac catheterization can show whether plaque is narrowing or blocking your heart's arteries. This procedure is also used to evaluate the valves, as well as measure the blood flow and oxygen levels in different parts of your heart. For further information please visit [email or web address] Please follow instruction sheet, as given.    Follow-Up:  Your physician recommends that you schedule a follow-up appointment next available with Dr. Clifton James after cath ro discuss TAVR.   At [facility], you and your health needs are our priority.  As part of our continuing mission to provide you with exceptional heart care, we  have created designated Provider Care Teams.  These Care Teams include your primary Cardiologist (physician) and Advanced Practice Providers (APPs -  Physician Assistants and Nurse Practitioners) who all work together to provide you with the care you need, when you need it.  We recommend signing up for the patient portal called "MyChart".  Sign up information is provided on this After Visit Summary.  MyChart is used to connect with patients for Virtual Visits (Telemedicine).  Patients are able to view lab/test results, encounter notes, upcoming appointments, etc.  Non-urgent messages can be sent to your provider as well.   To learn more about what you can do with MyChart, go to [email or web address].    Your next appointment:   2 month(s)-already scheduled  The format for your next appointment:   In Person  Provider:   You may see Christell Constant, MD or one of the following Advanced Practice Providers on your designated Care Team:    Ronie Spies, PA-C  Jacolyn Reedy, PA-C    Other Instructions  Due to recent COVID-19 restrictions implemented by our local and state authorities and in an effort to keep both patients and staff as safe as possible, our hospital system requires COVID-19 testing prior to certain scheduled hospital procedures.  Please go to [address] [facility]. [place], [place] [address] on 07/16/20 at 1:30pm  .  This is a drive up testing site.  You will not need to exit your vehicle. You must agree to self-quarantine from the time of your testing until the procedure date on 07/19/20.  This should included staying  home with ONLY the people you live with.  Avoid take-out, grocery store shopping or leaving the house for any non-emergent reason.  Failure to have your COVID-19 test done on the date and time you have been scheduled will result in cancellation of your procedure.  Please call our office at [phone] if you have any questions.     [facility]  CARDIOVASCULAR DIVISION [facility] ST OFFICE [name] [place] [address] Dept: [phone] Loc: [phone]  Chad Wilkerson  07/12/2020  You are scheduled for a Cardiac Catheterization on Monday, February 7 with Dr. Verne Carrow.  1. Please arrive at the [facility] (Main Entrance A) at [facility]: [address] [place], [place] [address] at 7:00 AM (This time is two hours before your procedure to ensure your preparation). Free valet parking service is available.   Special note: Every effort is made to have your procedure done on time. Please understand that emergencies sometimes delay scheduled procedures.  2. Diet: Do not eat solid foods after midnight.  The patient may have clear liquids until 5am upon the day of the procedure.  3. Labs: You will have labs drawn today.  4. Medication instructions in preparation for your procedure:   Contrast Allergy: No  Stop taking Eliquis (Apixiban) on Saturday, February 5.  You will need to hold your Farxiga, Spironolactone, Torsemide and Potassium the morning of your procedure.  On the morning of your procedure, take your Aspirin and any morning medicines NOT listed above.  You may use sips of water.  5. Plan for one night stay--bring personal belongings. 6. Bring a current list of your medications and current insurance cards. 7. You MUST have a responsible person to drive you home. 8. Someone MUST be with you the first 24 hours after you arrive home or your discharge will be delayed. 9. Please wear clothes that are easy to get on and off and wear slip-on shoes.  Thank you for allowing [place] to care for you!   -- [facility]

## 2020-07-13 ENCOUNTER — Telehealth: Payer: Self-pay | Admitting: *Deleted

## 2020-07-13 LAB — CBC
Hematocrit: 44.4 % (ref 37.5–51.0)
Hemoglobin: 15 g/dL (ref 13.0–17.7)
MCH: 29.6 pg (ref 26.6–33.0)
MCHC: 33.8 g/dL (ref 31.5–35.7)
MCV: 88 fL (ref 79–97)
Platelets: 263 10*3/uL (ref 150–450)
RBC: 5.07 x10E6/uL (ref 4.14–5.80)
RDW: 14.5 % (ref 11.6–15.4)
WBC: 9.3 10*3/uL (ref 3.4–10.8)

## 2020-07-13 LAB — BASIC METABOLIC PANEL
BUN/Creatinine Ratio: 20 (ref 10–24)
BUN: 21 mg/dL (ref 8–27)
CO2: 24 mmol/L (ref 20–29)
Calcium: 9.6 mg/dL (ref 8.6–10.2)
Chloride: 95 mmol/L — ABNORMAL LOW (ref 96–106)
Creatinine, Ser: 1.07 mg/dL (ref 0.76–1.27)
GFR calc Af Amer: 79 mL/min/{1.73_m2} (ref >59)
GFR calc non Af Amer: 68 mL/min/{1.73_m2} (ref >59)
Glucose: 122 mg/dL — ABNORMAL HIGH (ref 65–99)
Potassium: 3.9 mmol/L (ref 3.5–5.2)
Sodium: 138 mmol/L (ref 134–144)

## 2020-07-13 MED ORDER — DAPAGLIFLOZIN PROPANEDIOL 10 MG PO TABS
10.0000 mg | ORAL_TABLET | Freq: Every day | ORAL | 0 refills | Status: AC
Start: 2020-07-13 — End: ?

## 2020-07-13 NOTE — Telephone Encounter (Signed)
-----   Message from Verner Chol, [facility] sent at 07/13/2020  2:27 PM EST ----- Regarding: Farxiga sample Hi,  Can you put in a Farxiga 10 mg sample for Mr. Riyansh Gerstner, one of Cory's patients? I gave him 3 boxes (21 tabs total) of lot: [id number] exp: 05-2022 and 1 box (7 tabs) of [id number] exp: 01/10/2023.  Thank you, Maddie

## 2020-07-15 ENCOUNTER — Telehealth: Payer: Self-pay | Admitting: *Deleted

## 2020-07-15 ENCOUNTER — Telehealth: Payer: Self-pay

## 2020-07-15 NOTE — Telephone Encounter (Signed)
Pt contacted pre-catheterization scheduled at [facility] for: Monday July 19, 2020 9 AM Verified arrival time and place: [facility] Main Entrance A [facility]) at: 7 AM   No solid food after midnight prior to cath, clear liquids until 5 AM day of procedure.  Hold:  Eliquis-none 07/16/20 until post procedure Spironolactone-AM of procedure  Torsemide/KCl-AM of procedure No diabetes medications AM of procedure: -Metformin-day of procedure and 48 hours post procedure -Rybelsus-AM of procedure -Farxiga-AM of procedure-pt reports not taking  Except hold medications AM meds can be  taken pre-cath with sips of water including: ASA 81 mg   Confirmed patient has responsible adult to drive home post procedure and be with patient first 24 hours after arriving home: yes  You are allowed ONE visitor in the waiting room during the time you are at the hospital for your procedure. Both you and your visitor must wear a mask once you enter the hospital.   Reviewed procedure/mask/visitor instructions with patient.

## 2020-07-15 NOTE — Telephone Encounter (Signed)
Pt assistance forms printed off and given to Dr. Izora Ribas to sign and date.  He has signed and dated this.  Will place in nurse fax box in medical records to be faxed to [place] pt assistance, at contact information provided on cover sheet.

## 2020-07-15 NOTE — Telephone Encounter (Signed)
**Note De-Identified Wanette Robison Obfuscation** An [place] [employer] [place] pt asst application for Chad Wilkerson was left at the office for this pt. I have completed the provider page of the application and emailed all to the nurse working with Dr Izora Ribas today so she can obtain his signature, date it, and to fax all to [facility] and [place] at the fax number written on the cover letter included.

## 2020-07-16 ENCOUNTER — Other Ambulatory Visit (HOSPITAL_COMMUNITY)
Admission: RE | Admit: 2020-07-16 | Discharge: 2020-07-16 | Disposition: A | Discharge status: Home / Self Care | Payer: Medicare Other | Source: Ambulatory Visit | Attending: Cardiovascular Disease | Admitting: Cardiovascular Disease

## 2020-07-16 ENCOUNTER — Telehealth: Payer: Self-pay | Admitting: Pharmacist

## 2020-07-16 DIAGNOSIS — Z01812 Encounter for preprocedural laboratory examination: Secondary | ICD-10-CM | POA: Diagnosis present

## 2020-07-16 DIAGNOSIS — Z20822 Contact with and (suspected) exposure to covid-19: Secondary | ICD-10-CM | POA: Diagnosis not present

## 2020-07-16 LAB — SARS CORONAVIRUS 2 (TAT 6-24 HRS): SARS Coronavirus 2: NEGATIVE

## 2020-07-17 ENCOUNTER — Other Ambulatory Visit (HOSPITAL_COMMUNITY): Payer: Medicare Other

## 2020-07-19 ENCOUNTER — Other Ambulatory Visit: Payer: Self-pay

## 2020-07-19 ENCOUNTER — Encounter (HOSPITAL_COMMUNITY)
Admission: RE | Disposition: A | Discharge status: Home / Self Care | Payer: Self-pay | Source: Home / Self Care | Attending: Cardiovascular Disease

## 2020-07-19 ENCOUNTER — Observation Stay (HOSPITAL_COMMUNITY)
Admission: RE | Admit: 2020-07-19 | Discharge: 2020-07-20 | DRG: 286 | Disposition: A | Discharge status: Home / Self Care | Payer: Medicare Other | Attending: Cardiovascular Disease | Admitting: Cardiovascular Disease

## 2020-07-19 DIAGNOSIS — E1151 Type 2 diabetes mellitus with diabetic peripheral angiopathy without gangrene: Secondary | ICD-10-CM | POA: Diagnosis present

## 2020-07-19 DIAGNOSIS — Z87891 Personal history of nicotine dependence: Secondary | ICD-10-CM | POA: Diagnosis not present

## 2020-07-19 DIAGNOSIS — E119 Type 2 diabetes mellitus without complications: Secondary | ICD-10-CM | POA: Insufficient documentation

## 2020-07-19 DIAGNOSIS — Z9049 Acquired absence of other specified parts of digestive tract: Secondary | ICD-10-CM

## 2020-07-19 DIAGNOSIS — I472 Ventricular tachycardia: Secondary | ICD-10-CM | POA: Diagnosis not present

## 2020-07-19 DIAGNOSIS — Z8249 Family history of ischemic heart disease and other diseases of the circulatory system: Secondary | ICD-10-CM | POA: Diagnosis not present

## 2020-07-19 DIAGNOSIS — Z7984 Long term (current) use of oral hypoglycemic drugs: Secondary | ICD-10-CM | POA: Diagnosis not present

## 2020-07-19 DIAGNOSIS — Z95810 Presence of automatic (implantable) cardiac defibrillator: Secondary | ICD-10-CM

## 2020-07-19 DIAGNOSIS — I480 Paroxysmal atrial fibrillation: Secondary | ICD-10-CM | POA: Diagnosis present

## 2020-07-19 DIAGNOSIS — Z7951 Long term (current) use of inhaled steroids: Secondary | ICD-10-CM

## 2020-07-19 DIAGNOSIS — I350 Nonrheumatic aortic (valve) stenosis: Secondary | ICD-10-CM | POA: Diagnosis present

## 2020-07-19 DIAGNOSIS — I110 Hypertensive heart disease with heart failure: Principal | ICD-10-CM | POA: Diagnosis present

## 2020-07-19 DIAGNOSIS — Z79899 Other long term (current) drug therapy: Secondary | ICD-10-CM

## 2020-07-19 DIAGNOSIS — G4733 Obstructive sleep apnea (adult) (pediatric): Secondary | ICD-10-CM | POA: Diagnosis present

## 2020-07-19 DIAGNOSIS — I5023 Acute on chronic systolic (congestive) heart failure: Secondary | ICD-10-CM | POA: Diagnosis not present

## 2020-07-19 DIAGNOSIS — I2510 Atherosclerotic heart disease of native coronary artery without angina pectoris: Secondary | ICD-10-CM | POA: Diagnosis present

## 2020-07-19 DIAGNOSIS — J449 Chronic obstructive pulmonary disease, unspecified: Secondary | ICD-10-CM | POA: Diagnosis present

## 2020-07-19 DIAGNOSIS — E559 Vitamin D deficiency, unspecified: Secondary | ICD-10-CM | POA: Diagnosis not present

## 2020-07-19 DIAGNOSIS — I428 Other cardiomyopathies: Secondary | ICD-10-CM | POA: Diagnosis not present

## 2020-07-19 DIAGNOSIS — E876 Hypokalemia: Secondary | ICD-10-CM | POA: Diagnosis not present

## 2020-07-19 DIAGNOSIS — Z6833 Body mass index (BMI) 33.0-33.9, adult: Secondary | ICD-10-CM | POA: Insufficient documentation

## 2020-07-19 DIAGNOSIS — Z7901 Long term (current) use of anticoagulants: Secondary | ICD-10-CM

## 2020-07-19 DIAGNOSIS — Z6835 Body mass index (BMI) 35.0-35.9, adult: Secondary | ICD-10-CM | POA: Diagnosis not present

## 2020-07-19 DIAGNOSIS — I429 Cardiomyopathy, unspecified: Secondary | ICD-10-CM | POA: Insufficient documentation

## 2020-07-19 DIAGNOSIS — I080 Rheumatic disorders of both mitral and aortic valves: Secondary | ICD-10-CM | POA: Diagnosis present

## 2020-07-19 DIAGNOSIS — I340 Nonrheumatic mitral (valve) insufficiency: Secondary | ICD-10-CM | POA: Diagnosis present

## 2020-07-19 DIAGNOSIS — I440 Atrioventricular block, first degree: Secondary | ICD-10-CM | POA: Diagnosis not present

## 2020-07-19 DIAGNOSIS — I10 Essential (primary) hypertension: Secondary | ICD-10-CM

## 2020-07-19 DIAGNOSIS — I5020 Unspecified systolic (congestive) heart failure: Secondary | ICD-10-CM | POA: Diagnosis present

## 2020-07-19 HISTORY — PX: RIGHT/LEFT HEART CATH AND CORONARY ANGIOGRAPHY: CATH118266

## 2020-07-19 LAB — POCT I-STAT EG7
Acid-Base Excess: 3 mmol/L — ABNORMAL HIGH (ref 0.0–2.0)
Bicarbonate: 30.1 mmol/L — ABNORMAL HIGH (ref 20.0–28.0)
Calcium, Ion: 1.23 mmol/L (ref 1.15–1.40)
HCT: 43 % (ref 39.0–52.0)
Hemoglobin: 14.6 g/dL (ref 13.0–17.0)
O2 Saturation: 66 %
Potassium: 3.6 mmol/L (ref 3.5–5.1)
Sodium: 140 mmol/L (ref 135–145)
TCO2: 32 mmol/L (ref 22–32)
pCO2, Ven: 54.9 mmHg (ref 44.0–60.0)
pH, Ven: 7.348 (ref 7.250–7.430)
pO2, Ven: 37 mmHg (ref 32.0–45.0)

## 2020-07-19 LAB — POCT I-STAT 7, (LYTES, BLD GAS, ICA,H+H)
Acid-Base Excess: 3 mmol/L — ABNORMAL HIGH (ref 0.0–2.0)
Bicarbonate: 28.9 mmol/L — ABNORMAL HIGH (ref 20.0–28.0)
Calcium, Ion: 1.2 mmol/L (ref 1.15–1.40)
HCT: 42 % (ref 39.0–52.0)
Hemoglobin: 14.3 g/dL (ref 13.0–17.0)
O2 Saturation: 98 %
Potassium: 3.6 mmol/L (ref 3.5–5.1)
Sodium: 139 mmol/L (ref 135–145)
TCO2: 30 mmol/L (ref 22–32)
pCO2 arterial: 49.5 mmHg — ABNORMAL HIGH (ref 32.0–48.0)
pH, Arterial: 7.374 (ref 7.350–7.450)
pO2, Arterial: 110 mmHg — ABNORMAL HIGH (ref 83.0–108.0)

## 2020-07-19 LAB — GLUCOSE, CAPILLARY
Glucose-Capillary: 124 mg/dL — ABNORMAL HIGH (ref 70–99)
Glucose-Capillary: 134 mg/dL — ABNORMAL HIGH (ref 70–99)
Glucose-Capillary: 131 mg/dL — ABNORMAL HIGH (ref 70–99)

## 2020-07-19 SURGERY — RIGHT/LEFT HEART CATH AND CORONARY ANGIOGRAPHY
Anesthesia: LOCAL

## 2020-07-19 MED ORDER — APIXABAN 5 MG PO TABS
5.0000 mg | ORAL_TABLET | Freq: Two times a day (BID) | ORAL | Status: DC
  Administered 2020-07-20: 5 mg via ORAL
  Filled 2020-07-19: qty 1

## 2020-07-19 MED ORDER — MOMETASONE FURO-FORMOTEROL FUM 200-5 MCG/ACT IN AERO
2.0000 | INHALATION_SPRAY | Freq: Two times a day (BID) | RESPIRATORY_TRACT | Status: DC
  Administered 2020-07-19: 2 via RESPIRATORY_TRACT
  Filled 2020-07-19: qty 8.8

## 2020-07-19 MED ORDER — ATORVASTATIN CALCIUM 10 MG PO TABS
20.0000 mg | ORAL_TABLET | Freq: Every day | ORAL | Status: DC
  Administered 2020-07-19 – 2020-07-20 (×2): 20 mg via ORAL
  Filled 2020-07-19 (×3): qty 2

## 2020-07-19 MED ORDER — SODIUM CHLORIDE 0.9 % IV SOLN: 250.0000 mL | INTRAVENOUS | Status: DC | PRN

## 2020-07-19 MED ORDER — HEPARIN (PORCINE) IN NACL 1000-0.9 UT/500ML-% IV SOLN
INTRAVENOUS | Status: DC | PRN
  Administered 2020-07-19 (×2): 500 mL

## 2020-07-19 MED ORDER — HYDRALAZINE HCL 20 MG/ML IJ SOLN: 10.0000 mg | INTRAMUSCULAR | Status: AC | PRN

## 2020-07-19 MED ORDER — LIDOCAINE HCL (PF) 1 % IJ SOLN
INTRAMUSCULAR | Status: AC
  Filled 2020-07-19: qty 30

## 2020-07-19 MED ORDER — SODIUM CHLORIDE 0.9 % IV SOLN: INTRAVENOUS | Status: AC

## 2020-07-19 MED ORDER — SODIUM CHLORIDE 0.9% FLUSH: 3.0000 mL | INTRAVENOUS | Status: DC | PRN

## 2020-07-19 MED ORDER — ACETAMINOPHEN 325 MG PO TABS: 650.0000 mg | ORAL_TABLET | ORAL | Status: DC | PRN

## 2020-07-19 MED ORDER — HEPARIN SODIUM (PORCINE) 1000 UNIT/ML IJ SOLN
INTRAMUSCULAR | Status: DC | PRN
  Administered 2020-07-19: 5000 [IU] via INTRAVENOUS

## 2020-07-19 MED ORDER — CARVEDILOL 6.25 MG PO TABS
6.2500 mg | ORAL_TABLET | Freq: Two times a day (BID) | ORAL | Status: DC
  Administered 2020-07-19 – 2020-07-20 (×2): 6.25 mg via ORAL
  Filled 2020-07-19: qty 1
  Filled 2020-07-19: qty 2
  Filled 2020-07-19 (×2): qty 1

## 2020-07-19 MED ORDER — DAPAGLIFLOZIN PROPANEDIOL 10 MG PO TABS
10.0000 mg | ORAL_TABLET | Freq: Every day | ORAL | Status: DC
  Administered 2020-07-20: 10 mg via ORAL
  Filled 2020-07-19 (×2): qty 1

## 2020-07-19 MED ORDER — FENTANYL CITRATE (PF) 100 MCG/2ML IJ SOLN
INTRAMUSCULAR | Status: AC
  Filled 2020-07-19: qty 2

## 2020-07-19 MED ORDER — SODIUM CHLORIDE 0.9 % IV SOLN: Freq: Once | INTRAVENOUS | Status: AC

## 2020-07-19 MED ORDER — MIDAZOLAM HCL 2 MG/2ML IJ SOLN
INTRAMUSCULAR | Status: DC | PRN
  Administered 2020-07-19: 1 mg via INTRAVENOUS

## 2020-07-19 MED ORDER — ESCITALOPRAM OXALATE 10 MG PO TABS
10.0000 mg | ORAL_TABLET | Freq: Every day | ORAL | Status: DC
  Administered 2020-07-19 – 2020-07-20 (×2): 10 mg via ORAL
  Filled 2020-07-19 (×3): qty 1

## 2020-07-19 MED ORDER — ASPIRIN 81 MG PO CHEW: 81.0000 mg | CHEWABLE_TABLET | ORAL | Status: DC

## 2020-07-19 MED ORDER — DOFETILIDE 125 MCG PO CAPS
125.0000 ug | ORAL_CAPSULE | Freq: Two times a day (BID) | ORAL | Status: DC
  Administered 2020-07-19 – 2020-07-20 (×2): 125 ug via ORAL
  Filled 2020-07-19 (×2): qty 1

## 2020-07-19 MED ORDER — ONDANSETRON HCL 4 MG/2ML IJ SOLN: 4.0000 mg | Freq: Four times a day (QID) | INTRAMUSCULAR | Status: DC | PRN

## 2020-07-19 MED ORDER — HEPARIN (PORCINE) IN NACL 1000-0.9 UT/500ML-% IV SOLN
INTRAVENOUS | Status: AC
  Filled 2020-07-19: qty 1000

## 2020-07-19 MED ORDER — MIDAZOLAM HCL 2 MG/2ML IJ SOLN
INTRAMUSCULAR | Status: AC
  Filled 2020-07-19: qty 2

## 2020-07-19 MED ORDER — VERAPAMIL HCL 2.5 MG/ML IV SOLN
INTRAVENOUS | Status: DC | PRN
  Administered 2020-07-19: 10 mL via INTRA_ARTERIAL

## 2020-07-19 MED ORDER — HEPARIN SODIUM (PORCINE) 1000 UNIT/ML IJ SOLN
INTRAMUSCULAR | Status: AC
  Filled 2020-07-19: qty 1

## 2020-07-19 MED ORDER — SODIUM CHLORIDE 0.9 % IV SOLN: INTRAVENOUS | Status: DC

## 2020-07-19 MED ORDER — SPIRONOLACTONE 12.5 MG HALF TABLET
25.0000 mg | ORAL_TABLET | Freq: Every day | ORAL | Status: DC
  Administered 2020-07-19 – 2020-07-20 (×2): 25 mg via ORAL
  Filled 2020-07-19 (×3): qty 2

## 2020-07-19 MED ORDER — LIDOCAINE HCL (PF) 1 % IJ SOLN
INTRAMUSCULAR | Status: DC | PRN
  Administered 2020-07-19: 5 mL
  Administered 2020-07-19: 2 mL

## 2020-07-19 MED ORDER — SODIUM CHLORIDE 0.9% FLUSH
3.0000 mL | Freq: Two times a day (BID) | INTRAVENOUS | Status: DC
  Administered 2020-07-19: 3 mL via INTRAVENOUS

## 2020-07-19 MED ORDER — VERAPAMIL HCL 2.5 MG/ML IV SOLN
INTRAVENOUS | Status: AC
  Filled 2020-07-19: qty 2

## 2020-07-19 MED ORDER — IOHEXOL 350 MG/ML SOLN
INTRAVENOUS | Status: DC | PRN
  Administered 2020-07-19: 45 mL via INTRA_ARTERIAL

## 2020-07-19 MED ORDER — LABETALOL HCL 5 MG/ML IV SOLN: 10.0000 mg | INTRAVENOUS | Status: AC | PRN

## 2020-07-19 MED ORDER — FENTANYL CITRATE (PF) 100 MCG/2ML IJ SOLN
INTRAMUSCULAR | Status: DC | PRN
  Administered 2020-07-19: 25 ug via INTRAVENOUS

## 2020-07-19 MED ORDER — SPIRONOLACTONE 25 MG PO TABS
25.0000 mg | ORAL_TABLET | Freq: Every day | ORAL | Status: DC
  Filled 2020-07-19 (×2): qty 1

## 2020-07-19 MED ORDER — SODIUM CHLORIDE 0.9% FLUSH
3.0000 mL | Freq: Two times a day (BID) | INTRAVENOUS | Status: DC
  Administered 2020-07-19 – 2020-07-20 (×2): 3 mL via INTRAVENOUS

## 2020-07-19 MED ORDER — FUROSEMIDE 10 MG/ML IJ SOLN
80.0000 mg | Freq: Two times a day (BID) | INTRAMUSCULAR | Status: DC
  Administered 2020-07-19 – 2020-07-20 (×3): 80 mg via INTRAVENOUS
  Filled 2020-07-19 (×5): qty 8

## 2020-07-19 SURGICAL SUPPLY — 14 items
BAG SNAP BAND KOVER 36X36 (MISCELLANEOUS) ×1 IMPLANT
CATH 5FR JL3.5 JR4 ANG PIG MP (CATHETERS) ×1 IMPLANT
CATH BALLN WEDGE 5F 110CM (CATHETERS) ×1 IMPLANT
COVER DOME SNAP 22 D (MISCELLANEOUS) ×1 IMPLANT
DEVICE RAD COMP TR BAND LRG (VASCULAR PRODUCTS) ×1 IMPLANT
GLIDESHEATH SLEND SS 6F .021 (SHEATH) ×1 IMPLANT
GUIDEWIRE INQWIRE 1.5J.035X260 (WIRE) IMPLANT
INQWIRE 1.5J .035X260CM (WIRE) ×2
KIT HEART LEFT (KITS) ×2 IMPLANT
PACK CARDIAC CATHETERIZATION (CUSTOM PROCEDURE TRAY) ×2 IMPLANT
SHEATH GLIDE SLENDER 4/5FR (SHEATH) ×1 IMPLANT
TRANSDUCER W/STOPCOCK (MISCELLANEOUS) ×2 IMPLANT
TUBING CIL FLEX 10 FLL-RA (TUBING) ×2 IMPLANT
WIRE MICROINTRODUCER 60CM (WIRE) ×1 IMPLANT

## 2020-07-19 NOTE — Progress Notes (Signed)
Cath lab note:  Pt being admitted post cath for diuresis with IV Lasix. See elevated filling pressures in the cath note.  Pt dyspneic with speaking.    Chad Wilkerson 07/19/2020 11:04 AM

## 2020-07-19 NOTE — Progress Notes (Signed)
Dr Clifton James made aware of pt decreased BP, pt states he feels fine, denies being dizzy, order obtained, IVF bolus given over 1 hour, Dr. Sanjuana Kava informed nuse to wait a bit before giving IV lasix, safety maintained

## 2020-07-19 NOTE — Progress Notes (Signed)
Removal of right wrist TR Band without complication. Right wrist stable, no oozing or bruising present. Sterile gauze applied to site with tegaderm to secure. Patient understands post recovery instructions

## 2020-07-19 NOTE — Interval H&P Note (Signed)
History and Physical Interval Note:  07/19/2020 7:33 AM  Chad Wilkerson  has presented today for surgery, with the diagnosis of aortic stenosis - heart failure.  The various methods of treatment have been discussed with the patient and family. After consideration of risks, benefits and other options for treatment, the patient has consented to  Procedure(s): RIGHT/LEFT HEART CATH AND CORONARY ANGIOGRAPHY (N/A) as a surgical intervention.  The patient's history has been reviewed, patient examined, no change in status, stable for surgery.  I have reviewed the patient's chart and labs.  Questions were answered to the patient's satisfaction.    Cath Lab Visit (complete for each Cath Lab visit)  Clinical Evaluation Leading to the Procedure:   ACS: No.  Non-ACS:    Anginal Classification: CCS II  Anti-ischemic medical therapy: Minimal Therapy (1 class of medications)  Non-Invasive Test Results: No non-invasive testing performed  Prior CABG: No previous CABG        Verne Carrow

## 2020-07-20 ENCOUNTER — Encounter (HOSPITAL_COMMUNITY): Payer: Self-pay | Admitting: Cardiovascular Disease

## 2020-07-20 ENCOUNTER — Encounter: Payer: Medicare Other | Admitting: Cardiology

## 2020-07-20 ENCOUNTER — Other Ambulatory Visit: Payer: Self-pay | Admitting: Cardiology

## 2020-07-20 DIAGNOSIS — I5023 Acute on chronic systolic (congestive) heart failure: Secondary | ICD-10-CM | POA: Diagnosis not present

## 2020-07-20 DIAGNOSIS — E876 Hypokalemia: Secondary | ICD-10-CM

## 2020-07-20 DIAGNOSIS — I428 Other cardiomyopathies: Secondary | ICD-10-CM | POA: Diagnosis not present

## 2020-07-20 DIAGNOSIS — I110 Hypertensive heart disease with heart failure: Secondary | ICD-10-CM | POA: Diagnosis not present

## 2020-07-20 DIAGNOSIS — I472 Ventricular tachycardia: Secondary | ICD-10-CM | POA: Diagnosis not present

## 2020-07-20 HISTORY — DX: Other cardiomyopathies: I42.8

## 2020-07-20 LAB — BASIC METABOLIC PANEL
Anion gap: 12 (ref 5–15)
Anion gap: 13 (ref 5–15)
BUN: 18 mg/dL (ref 8–23)
BUN: 17 mg/dL (ref 8–23)
CO2: 26 mmol/L (ref 22–32)
CO2: 26 mmol/L (ref 22–32)
Calcium: 9.1 mg/dL (ref 8.9–10.3)
Calcium: 9.3 mg/dL (ref 8.9–10.3)
Chloride: 101 mmol/L (ref 98–111)
Chloride: 98 mmol/L (ref 98–111)
Creatinine, Ser: 1.05 mg/dL (ref 0.61–1.24)
Creatinine, Ser: 1.14 mg/dL (ref 0.61–1.24)
GFR, Estimated: >60 mL/min (ref >60)
GFR, Estimated: >60 mL/min (ref >60)
Glucose, Bld: 134 mg/dL — ABNORMAL HIGH (ref 70–99)
Glucose, Bld: 145 mg/dL — ABNORMAL HIGH (ref 70–99)
Potassium: 3 mmol/L — ABNORMAL LOW (ref 3.5–5.1)
Potassium: 3.4 mmol/L — ABNORMAL LOW (ref 3.5–5.1)
Sodium: 139 mmol/L (ref 135–145)
Sodium: 137 mmol/L (ref 135–145)

## 2020-07-20 LAB — CBC
HCT: 38.6 % — ABNORMAL LOW (ref 39.0–52.0)
Hemoglobin: 13.3 g/dL (ref 13.0–17.0)
MCH: 30 pg (ref 26.0–34.0)
MCHC: 34.5 g/dL (ref 30.0–36.0)
MCV: 87.1 fL (ref 80.0–100.0)
Platelets: 213 10*3/uL (ref 150–400)
RBC: 4.43 MIL/uL (ref 4.22–5.81)
RDW: 15.1 % (ref 11.5–15.5)
WBC: 8.6 10*3/uL (ref 4.0–10.5)
nRBC: 0 % (ref 0.0–0.2)

## 2020-07-20 LAB — MAGNESIUM
Magnesium: 2 mg/dL (ref 1.7–2.4)
Magnesium: 2.1 mg/dL (ref 1.7–2.4)

## 2020-07-20 MED ORDER — ACETAMINOPHEN 325 MG PO TABS: 650.0000 mg | ORAL_TABLET | ORAL | Status: DC | PRN

## 2020-07-20 MED ORDER — POTASSIUM CHLORIDE CRYS ER 20 MEQ PO TBCR
80.0000 meq | EXTENDED_RELEASE_TABLET | Freq: Once | ORAL | Status: AC
  Administered 2020-07-20: 80 meq via ORAL
  Filled 2020-07-20: qty 4

## 2020-07-20 MED ORDER — RYBELSUS 3 MG PO TABS: 3.0000 mg | ORAL_TABLET | Freq: Every day | ORAL | Status: DC

## 2020-07-20 MED ORDER — POTASSIUM CHLORIDE ER 20 MEQ PO TBCR: 20.0000 meq | EXTENDED_RELEASE_TABLET | Freq: Two times a day (BID) | ORAL | Status: DC

## 2020-07-20 MED ORDER — METFORMIN HCL 500 MG PO TABS
500.0000 mg | ORAL_TABLET | ORAL | Status: AC
Start: 2020-07-22 — End: ?

## 2020-07-20 NOTE — Progress Notes (Signed)
Patient provide with all discharge education and materials. Emphasis placed on cath site care. Patient verbalized understanding of all information provided. IV access and telemetry monitor removed, no issues noted. Patient discharged with all belongings.

## 2020-07-20 NOTE — Chronic Care Management (AMB) (Signed)
Chronic Care Management Pharmacy Assistant   Name: Chad Wilkerson  MRN: [id number] DOB: 1946/01/23  Reason for Encounter: Patient Assistance   PCP : [name], NP  Allergies:  No Known Allergies  Medications: Facility-Administered Encounter Medications as of 07/16/2020  Medication  . [EXPIRED] 0.9 %  sodium chloride infusion  . 0.9 %  sodium chloride infusion  . [COMPLETED] 0.9 %  sodium chloride infusion  . acetaminophen (TYLENOL) tablet 650 mg  . apixaban (ELIQUIS) tablet 5 mg  . atorvastatin (LIPITOR) tablet 20 mg  . carvedilol (COREG) tablet 6.25 mg  . dapagliflozin propanediol (FARXIGA) tablet 10 mg  . dofetilide (TIKOSYN) capsule 125 mcg  . escitalopram (LEXAPRO) tablet 10 mg  . furosemide (LASIX) injection 80 mg  . [EXPIRED] hydrALAZINE (APRESOLINE) injection 10 mg  . [EXPIRED] labetalol (NORMODYNE) injection 10 mg  . mometasone-formoterol (DULERA) 200-5 MCG/ACT inhaler 2 puff  . ondansetron (ZOFRAN) injection 4 mg  . [COMPLETED] potassium chloride SA (KLOR-CON) CR tablet 80 mEq  . sodium chloride flush (NS) 0.9 % injection 3 mL  . sodium chloride flush (NS) 0.9 % injection 3 mL  . spironolactone (ALDACTONE) tablet 25 mg  . [DISCONTINUED] 0.9 %  sodium chloride infusion  . [DISCONTINUED] 0.9 %  sodium chloride infusion  . [DISCONTINUED] aspirin chewable tablet 81 mg  . [DISCONTINUED] aspirin chewable tablet 81 mg  . [DISCONTINUED] fentaNYL (SUBLIMAZE) injection  . [DISCONTINUED] Heparin (Porcine) in NaCl 1000-0.9 UT/500ML-% SOLN  . [DISCONTINUED] heparin sodium (porcine) injection  . [DISCONTINUED] iohexol (OMNIPAQUE) 350 MG/ML injection  . [DISCONTINUED] lidocaine (PF) (XYLOCAINE) 1 % injection  . [DISCONTINUED] midazolam (VERSED) injection  . [DISCONTINUED] Radial Cocktail/Verapamil only  . [DISCONTINUED] sodium chloride flush (NS) 0.9 % injection 3 mL  . [DISCONTINUED] sodium chloride flush (NS) 0.9 % injection 3 mL  . [DISCONTINUED] spironolactone  (ALDACTONE) tablet 25 mg   Outpatient Encounter Medications as of 07/16/2020  Medication Sig Note  . acetaminophen (TYLENOL) 325 MG tablet Take 2 tablets (650 mg total) by mouth every 4 (four) hours as needed for headache or mild pain.   . Arginine (L-ARGININE-500) 500 MG CAPS Take 500 mg by mouth daily.   . Ascorbic Acid (VITAMIN C) 1000 MG tablet Take 1,000 mg by mouth 2 (two) times daily.   Marland Kitchen atorvastatin (LIPITOR) 20 MG tablet Take 20 mg by mouth daily.   . B Complex Vitamins (B COMPLEX PO) Take 1 tablet by mouth daily.   . Biotin 10 MG TABS Take 10 mg by mouth daily. 10 mg - 10,000 mcg   . budesonide-formoterol (SYMBICORT) 160-4.5 MCG/ACT inhaler Inhale 2 puffs into the lungs 2 (two) times daily. 07/19/2020: Pt is trying to get financial assistance to purchase refill  . CALCIUM PO Take 1 tablet by mouth daily.   . carvedilol (COREG) 6.25 MG tablet Take 6.25 mg by mouth 2 (two) times daily.   . cholecalciferol (VITAMIN D3) 25 MCG (1000 UNIT) tablet Take 1,000 Units by mouth daily.   . CHROMIUM PO Take 1 tablet by mouth 2 (two) times daily.   . dapagliflozin propanediol (FARXIGA) 10 MG TABS tablet Take 1 tablet (10 mg total) by mouth daily before breakfast.   . dofetilide (TIKOSYN) 125 MCG capsule Take 125 mcg by mouth every 12 (twelve) hours.   Marland Kitchen ELIQUIS 5 MG TABS tablet Take 5 mg by mouth 2 (two) times daily. 07/19/2020: On hold due to current procedure  . escitalopram (LEXAPRO) 10 MG tablet Take 1 tablet (10 mg  total) by mouth daily.   Marland Kitchen levOCARNitine (L-CARNITINE) 500 MG TABS Take 500 mg by mouth daily.   . Magnesium 400 MG TABS Take 400 mg by mouth daily.   Melene Muller ON 07/22/2020] metFORMIN (GLUCOPHAGE) 500 MG tablet Take 1-3 tablets (500-1,500 mg total) by mouth See admin instructions. Take one tablet (500 mg) by mouth daily with breakfast and three tablets (1500 mg) daily with supper   . ONETOUCH ULTRA test strip USE TO TEST BLOOD SUGAR 2 TIMES DAILY   . [START ON 07/21/2020] Potassium  Chloride ER 20 MEQ TBCR Take 20 mEq by mouth 2 (two) times daily.   . Semaglutide (RYBELSUS) 3 MG TABS Take 3 mg by mouth daily. L3019B and exp: 07/2020.   . spironolactone (ALDACTONE) 25 MG tablet Take 25 mg by mouth daily.   Marland Kitchen torsemide (DEMADEX) 20 MG tablet Take 3 tablets (60 mg total) by mouth 2 (two) times daily.   . vitamin E 180 MG (400 UNITS) capsule Take 400 Units by mouth daily.   . Zinc 50 MG TABS Take 50 mg by mouth daily.   Marland Kitchen Zn-Pyg Afri-Nettle-Saw Palmet (SAW PALMETTO COMPLEX PO) Take 540 mg by mouth daily.   . [DISCONTINUED] Coenzyme Q10 (COQ10) 100 MG CAPS Take by mouth daily.   . [DISCONTINUED] metFORMIN (GLUCOPHAGE) 500 MG tablet Take 500-1,500 mg by mouth See admin instructions. Take one tablet (500 mg) by mouth daily with breakfast and three tablets (1500 mg) daily with supper   . [DISCONTINUED] Multiple Vitamins-Minerals (ZINC PO) Take by mouth daily.   . [DISCONTINUED] Omega-3 Fatty Acids (FISH OIL PO) Take 1,000 mg by mouth.   . [DISCONTINUED] OVER THE COUNTER MEDICATION Chorium   . [DISCONTINUED] OVER THE COUNTER MEDICATION L-caritine 500mg    . [DISCONTINUED] Potassium Chloride ER 20 MEQ TBCR Take 2 tablets by mouth daily. (Patient taking differently: Take 20 mEq by mouth 2 (two) times daily.)   . [DISCONTINUED] Semaglutide (RYBELSUS) 3 MG TABS Take 1 each by mouth daily. L3019B and exp: 07/2020. (Patient taking differently: Take 3 mg by mouth daily. 08/2020 and exp: 07/2020.)     Current Diagnosis: Patient Active Problem List   Diagnosis Date Noted  . NICM (nonischemic cardiomyopathy) (HCC) 07/20/2020  . Acute on chronic systolic CHF (congestive heart failure), NYHA class 3 (HCC) 07/19/2020  . Acute on chronic systolic CHF (congestive heart failure), NYHA class 4 (HCC)   . Aortic valve stenosis   . Heart failure with reduced ejection fraction (HCC) 04/01/2020  . Atrial fibrillation (HCC) 04/01/2020  . Moderate aortic stenosis 04/01/2020  . Mild mitral regurgitation  04/01/2020  . Morbid obesity (HCC) 04/01/2020  . Sleep apnea   . Diabetes mellitus with coincident hypertension (HCC)   . Diverticulitis   . CHF (congestive heart failure) (HCC)   . Adjustment disorder     Goals Addressed   None     Follow-Up:  Patient Assistance Coordination  I called and spoke with 04/03/2020) at [facility] about patient applications. It was received. Unfortunately, the patient application was on hold at this time. Verification of the provider's information needed to be sent in. Until this information is called back in or can be verified the patient application is on hold. I notified the CPP of this information.  [facility] Clinical Pharmacy Assistant [phone]

## 2020-07-20 NOTE — Care Management CC44 (Signed)
Condition Code 44 Documentation Completed  Patient Details  Name: SAAGAR TORTORELLA MRN: [id number] Date of Birth: 02-11-1946   Condition Code 44 given:  Yes Patient signature on Condition Code 44 notice:    Documentation of 2 MD's agreement:  Yes Code 44 added to claim:  Yes    Michel Bickers, RN 07/20/2020, 5:11 PM

## 2020-07-20 NOTE — Discharge Instructions (Signed)
Call [facility] at [phone] if any bleeding, swelling or drainage at cath site.  May shower, no tub baths for 48 hours for groin sticks. No lifting over 5 pounds for 3 days.  No Driving for 3 days  Hold Metformin until the 10th - it may interact with cath dye if taken earlier.   Low salt diabetic diet.    Call the office if any questions or concerns.    Have blood work done tomorrow 07/21/20 in the morning to reheck potassium.  Any time is fine.    BMP

## 2020-07-20 NOTE — Progress Notes (Signed)
  [facility]   MULTIDISCIPLINARY HEART VALVE TEAM  Pt's case reviewed with the Multidisciplinary Heart Valve Team and Cardiac Cath findings indicate Moderate Aortic Stenosis.  The pt was previously scheduled to see Dr Clifton James on 2/21 for TAVR evaluation, this appointment has been cancelled.  The pt will continue to follow-up with Dr Izora Ribas for Aortic Valve Disease.  The Structural Heart Team will be available in the future if needed for consultation.

## 2020-07-20 NOTE — Progress Notes (Signed)
Progress Note  Patient Name: Chad Wilkerson Date of Encounter: 07/20/2020  Primary Cardiologist:   Christell Constant, MD   Subjective   He is breathing at baseline.  No acute SOB.  No dizziness or palpitations.  Able to ambulate in the room.   Inpatient Medications    Scheduled Meds: . apixaban  5 mg Oral BID  . atorvastatin  20 mg Oral Daily  . carvedilol  6.25 mg Oral BID  . dapagliflozin propanediol  10 mg Oral QAC breakfast  . dofetilide  125 mcg Oral Q12H  . escitalopram  10 mg Oral Daily  . furosemide  80 mg Intravenous Q12H  . mometasone-formoterol  2 puff Inhalation BID  . sodium chloride flush  3 mL Intravenous Q12H  . spironolactone  25 mg Oral Daily   Continuous Infusions: . sodium chloride     PRN Meds: sodium chloride, acetaminophen, ondansetron (ZOFRAN) IV, sodium chloride flush   Vital Signs    Vitals:   07/20/20 0018 07/20/20 0412 07/20/20 0806 07/20/20 0839  BP: (!) 95/55 100/62 (!) 97/55 94/60  Pulse: 71 65 66   Resp: 16 16 16    Temp: 98.7 F (37.1 C) 97.7 F (36.5 C) (!) 97.5 F (36.4 C)   TempSrc: Oral Axillary Oral   SpO2: 93% 97% 93%   Weight: 103.5 kg 103 kg    Height:        Intake/Output Summary (Last 24 hours) at 07/20/2020 0954 Last data filed at 07/20/2020 0846 Gross per 24 hour  Intake 1200 ml  Output 1580 ml  Net -380 ml   Filed Weights   07/19/20 1822 07/20/20 0018 07/20/20 0412  Weight: 103.4 kg 103.5 kg 103 kg    Telemetry    NSR, NSVT - Personally Reviewed  ECG    NSR, first degree AV block.  IVCD.  PVCs in a bigeminal pattern - Personally Reviewed  Physical Exam   GEN: No acute distress.   Neck: No  JVD Cardiac: RRR, soft apical systolic murmur, no diastolic murmurs, rubs, or gallops.  Respiratory: Clear  to auscultation bilaterally. GI: Soft, nontender, non-distended  MS: No  edema; No deformity.  Right radial site without bleeding or bruising.  Neuro:  Nonfocal  Psych: Normal affect   Labs     Chemistry Recent Labs  Lab 07/19/20 0902 07/19/20 0906 07/20/20 0352  NA 140 139 139  K 3.6 3.6 3.0*  CL  --   --  101  CO2  --   --  26  GLUCOSE  --   --  134*  BUN  --   --  18  CREATININE  --   --  1.05  CALCIUM  --   --  9.1  GFRNONAA  --   --  >60  ANIONGAP  --   --  12     Hematology Recent Labs  Lab 07/19/20 0902 07/19/20 0906 07/20/20 0352  WBC  --   --  8.6  RBC  --   --  4.43  HGB 14.6 14.3 13.3  HCT 43.0 42.0 38.6*  MCV  --   --  87.1  MCH  --   --  30.0  MCHC  --   --  34.5  RDW  --   --  15.1  PLT  --   --  213    Cardiac EnzymesNo results for input(s): TROPONINI in the last 168 hours. No results for input(s): TROPIPOC in the last 168  hours.   BNPNo results for input(s): BNP, PROBNP in the last 168 hours.   DDimer No results for input(s): DDIMER in the last 168 hours.   Radiology    CARDIAC CATHETERIZATION  Result Date: 07/19/2020  Prox RCA to Mid RCA lesion is 20% stenosed.  1st Mrg lesion is 40% stenosed.  Mid LAD lesion is 20% stenosed.  1. Mild non-obstructive CAD 2. Elevated filling pressures: RA:13, RV 64/8/14 PA 64/26 (mean 40), PCWP: 28, LV: 101/15/25  AO: 90/60 CO: 4.43 L/min  CI: 2.04 3. Moderate aortic stenosis. He has low gradients across the valve by echo and by cath. Cath: mean gradient 10.5 mmHg, peak to peak gradient 12 mmhg, AVA 1.83 cm2).  The valve was easily crossed with the J wire. Cannot exclude low flow/low gradient AS but the dimensionless index and AVA findings on echo would also argue against this being severe. Recommendations: He is volume overloaded and dyspneic even with speaking. Will admit for diuresis with IV Lasix. He has severe LV systolic dysfunction with low gradients across the aortic valve. The valve was easily crossed with a J wire. I suspect that he has moderate aortic stenosis. (Echo with AVA over 1.0 cm2, dimensionless index 0.36). Will review his findings with our structural heart team.    Cardiac Studies    CARDIAC CATH:   Prox RCA to Mid RCA lesion is 20% stenosed.  1st Mrg lesion is 40% stenosed.  Mid LAD lesion is 20% stenosed.   1. Mild non-obstructive CAD 2. Elevated filling pressures: RA:13, RV 64/8/14 PA 64/26 (mean 40), PCWP: 28, LV: 101/15/25  AO: 90/60 CO: 4.43 L/min  CI: 2.04  3. Moderate aortic stenosis. He has low gradients across the valve by echo and by cath. Cath: mean gradient 10.5 mmHg, peak to peak gradient 12 mmhg, AVA 1.83 cm2).  The valve was easily crossed with the J wire. Cannot exclude low flow/low gradient AS but the dimensionless index and AVA findings on echo would also argue against this being severe.    Patient Profile     75 y.o. male with a hx of Diabetes with HTN, HFrEF 25-30% with [employer] ICD, 1st HB, mild to moderate AS, Mild MR, Atrial fibrillation on Dofetilide, eliquis, and coreg;  OSA on BiPAP seen 04/01/20.  In interim from that visit, established care with EP [name]). Started Toresmide 60 mg BID at last visit.   Assessment & Plan    AORTIC STENOSIS:  He is status post cath yesterday and had findings as above.  His AS is felt to be moderate and there is no plan for TAVR.  Continue medical management.   ACUTE SYSTOLIC HF:   Continue IV diuresis.  He does tolerate beta blocker.  Reading through the office notes he has not been on ARB/ARNI because of previous low BPs.   SBP currently in the 90s.  Unable to titrate meds.   Weight is down 4 lbs.     HYPOKALEMIA:    Will supplement today and follow up a BMET this afternoon before discharge.   Check a magnesium today.      DM:   Resumed Farxiga on admission.   Meformin was held with cath.  We can resume this tomorrow.  Resume semaglutide at discharge.    PAF:  Continue Eliquis.  Continue Tikosyn.   He has had NSVT.   QT is prolonged but this is at baseline and he also has a conduction abnormality.  He is know to have frequent  ventricular ectopy and an ICD.   Continue Tikosyn as the EKG today does not  represent a change.    NSVT:  As above.  Check electrolytes before discharge.  He has an ICD in place.    ICD:  Up to date with follow up.     For questions or updates, please contact [facility] Please consult [email or web address] for contact info under Cardiology/STEMI.   Signed, Rollene Rotunda, MD  07/20/2020, 9:54 AM

## 2020-07-20 NOTE — Discharge Summary (Addendum)
Discharge Summary    Patient ID: Chad Wilkerson MRN: [id number]; DOB: 04-13-1946  Admit date: 07/19/2020 Discharge date: 07/20/2020  Primary Care Provider: [name], NP  Primary Cardiologist: Christell Constant, MD  Primary Electrophysiologist:  None   Discharge Diagnoses    Principal Problem:   Acute on chronic systolic CHF (congestive heart failure), NYHA class 4 (HCC) Active Problems:   Moderate aortic stenosis   NICM (nonischemic cardiomyopathy) (HCC)   Diabetes mellitus with coincident hypertension (HCC)   Heart failure with reduced ejection fraction [facility])    Diagnostic Studies/Procedures    Cardiac cath 07/19/20  Prox RCA to Mid RCA lesion is 20% stenosed. 1st Mrg lesion is 40% stenosed. Mid LAD lesion is 20% stenosed.   1. Mild non-obstructive CAD 2. Elevated filling pressures: RA:13, RV 64/8/14 PA 64/26 (mean 40), PCWP: 28, LV: 101/15/25  AO: 90/60 CO: 4.43 L/min  CI: 2.04  3. Moderate aortic stenosis. He has low gradients across the valve by echo and by cath. Cath: mean gradient 10.5 mmHg, peak to peak gradient 12 mmhg, AVA 1.83 cm2).  The valve was easily crossed with the J wire. Cannot exclude low flow/low gradient AS but the dimensionless index and AVA findings on echo would also argue against this being severe.    Recommendations: He is volume overloaded and dyspneic even with speaking. Will admit for diuresis with IV Lasix. He has severe LV systolic dysfunction with low gradients across the aortic valve. The valve was easily crossed with a J wire. I suspect that he has moderate aortic stenosis. (Echo with AVA over 1.0 cm2, dimensionless index 0.36). Will review his findings with our structural heart team.       _____________   History of Present Illness     Chad Wilkerson is a 75 y.o. male with hx DB-2 with HTN, HFrEF 25-30%, NICM, ICD, MDT device, PAF on dofetilide, eliquis and coreg.  Also with OSA and BiPap.  With recent echo concern for  significant AS and pt with SOB at times cardiac cath was recommended.  Pt presented and underwent cath 07/19/20 and did well but found to be in acute CHF, NYHA level 4, dyspneic with talking so admitted for diuresis.     Hospital Course     Consultants: none   Cath results as above and per structural heart team no need for TAVR at this time.   Pt is neg 377 since admit and was seen and evaluated by Dr. Antoine Poche and once electrolytes replaced he is stable for discharge.  His K+ today is 3.0 and K+ is being replaced.  Will check BMP and Mg+ prior to discharge.  EKG with SR and 1st degree AV block with IVCD and PVCs.  Not on ARB/ARBI due to low BPs, systolic.  His wt is down 4 pounds with diuresing.    His diabetes was stable will resume metformin 48 hours post cath.  He will continue eliquis and tikosyn.  He did have NSVT and QT is prolonged but it is prolonged and at baseline along with conduction abnormality.   ICD followed by Dr. [name] and I believe placed in [place], [place].  Pt recently moved to [place].  Pt also had short run NSVT.  He does have ICD.   Improved labs will repeat as outpt on the 9th of Feb.  Did the patient have an acute coronary syndrome (MI, NSTEMI, STEMI, etc) this admission?:  No  Did the patient have a percutaneous coronary intervention (stent / angioplasty)?:  No.       _____________  Discharge Vitals Blood pressure 94/61, pulse 78, temperature 97.8 F (36.6 C), resp. rate 16, height 5\' 9"  (1.753 m), weight 103 kg, SpO2 91 %.  Filed Weights   07/19/20 1822 07/20/20 0018 07/20/20 0412  Weight: 103.4 kg 103.5 kg 103 kg    Labs & Radiologic Studies    CBC Recent Labs    07/19/20 0906 07/20/20 0352  WBC  --  8.6  HGB 14.3 13.3  HCT 42.0 38.6*  MCV  --  87.1  PLT  --  213   Basic Metabolic Panel Recent Labs    09/17/20 0352 07/20/20 1237  NA 139 137  K 3.0* 3.4*  CL 101 98  CO2 26 26  GLUCOSE 134* 145*  BUN 18 17   CREATININE 1.05 1.14  CALCIUM 9.1 9.3  MG 2.0 2.1   Liver Function Tests No results for input(s): AST, ALT, ALKPHOS, BILITOT, PROT, ALBUMIN in the last 72 hours. No results for input(s): LIPASE, AMYLASE in the last 72 hours. High Sensitivity Troponin:   No results for input(s): TROPONINIHS in the last 720 hours.  BNP Invalid input(s): POCBNP D-Dimer No results for input(s): DDIMER in the last 72 hours. Hemoglobin A1C No results for input(s): HGBA1C in the last 72 hours. Fasting Lipid Panel No results for input(s): CHOL, HDL, LDLCALC, TRIG, CHOLHDL, LDLDIRECT in the last 72 hours. Thyroid Function Tests No results for input(s): TSH, T4TOTAL, T3FREE, THYROIDAB in the last 72 hours.  Invalid input(s): FREET3 _____________  CARDIAC CATHETERIZATION  Result Date: 07/19/2020  Prox RCA to Mid RCA lesion is 20% stenosed.  1st Mrg lesion is 40% stenosed.  Mid LAD lesion is 20% stenosed.  1. Mild non-obstructive CAD 2. Elevated filling pressures: RA:13, RV 64/8/14 PA 64/26 (mean 40), PCWP: 28, LV: 101/15/25  AO: 90/60 CO: 4.43 L/min  CI: 2.04 3. Moderate aortic stenosis. He has low gradients across the valve by echo and by cath. Cath: mean gradient 10.5 mmHg, peak to peak gradient 12 mmhg, AVA 1.83 cm2).  The valve was easily crossed with the J wire. Cannot exclude low flow/low gradient AS but the dimensionless index and AVA findings on echo would also argue against this being severe. Recommendations: He is volume overloaded and dyspneic even with speaking. Will admit for diuresis with IV Lasix. He has severe LV systolic dysfunction with low gradients across the aortic valve. The valve was easily crossed with a J wire. I suspect that he has moderate aortic stenosis. (Echo with AVA over 1.0 cm2, dimensionless index 0.36). Will review his findings with our structural heart team.   Disposition   Pt is being discharged home today in good condition.  Follow-up Plans & Appointments   Call [facility] at [phone] if any bleeding, swelling or drainage at cath site.  May shower, no tub baths for 48 hours for groin sticks. No lifting over 5 pounds for 3 days.  No Driving for 3 days  Hold Metformin until the 10th - it may interact with cath dye if taken earlier.   Low salt diabetic diet.    Call the office if any questions or concerns.      Follow-up Information     11, MD Follow up on 07/28/2020.   Specialty: Cardiology Why: at 10:00 AM Contact information: [address] [place] [phone]  Discharge Medications   Allergies as of 07/20/2020   No Known Allergies      Medication List     TAKE these medications    acetaminophen 325 MG tablet Commonly known as: TYLENOL Take 2 tablets (650 mg total) by mouth every 4 (four) hours as needed for headache or mild pain.   atorvastatin 20 MG tablet Commonly known as: LIPITOR Take 20 mg by mouth daily.   B COMPLEX PO Take 1 tablet by mouth daily.   Biotin 10 MG Tabs Take 10 mg by mouth daily. 10 mg - 10,000 mcg   budesonide-formoterol 160-4.5 MCG/ACT inhaler Commonly known as: SYMBICORT Inhale 2 puffs into the lungs 2 (two) times daily.   CALCIUM PO Take 1 tablet by mouth daily.   carvedilol 6.25 MG tablet Commonly known as: COREG Take 6.25 mg by mouth 2 (two) times daily.   cholecalciferol 25 MCG (1000 UNIT) tablet Commonly known as: VITAMIN D3 Take 1,000 Units by mouth daily.   CHROMIUM PO Take 1 tablet by mouth 2 (two) times daily.   dapagliflozin propanediol 10 MG Tabs tablet Commonly known as: Farxiga Take 1 tablet (10 mg total) by mouth daily before breakfast.   dofetilide 125 MCG capsule Commonly known as: TIKOSYN Take 125 mcg by mouth every 12 (twelve) hours.   Eliquis 5 MG Tabs tablet Generic drug: apixaban Take 5 mg by mouth 2 (two) times daily.   escitalopram 10 MG tablet Commonly known  as: LEXAPRO Take 1 tablet (10 mg total) by mouth daily.   L-Arginine-500 500 MG Caps Generic drug: Arginine Take 500 mg by mouth daily.   L-Carnitine 500 MG Tabs Take 500 mg by mouth daily.   Magnesium 400 MG Tabs Take 400 mg by mouth daily.   metFORMIN 500 MG tablet Commonly known as: GLUCOPHAGE Take 1-3 tablets (500-1,500 mg total) by mouth See admin instructions. Take one tablet (500 mg) by mouth daily with breakfast and three tablets (1500 mg) daily with supper Start taking on: July 22, 2020 What changed: These instructions start on July 22, 2020. If you are unsure what to do until then, ask your doctor or other care provider.   OneTouch Ultra test strip Generic drug: glucose blood USE TO TEST BLOOD SUGAR 2 TIMES DAILY   Potassium Chloride ER 20 MEQ Tbcr Take 20 mEq by mouth 2 (two) times daily. Start taking on: July 21, 2020   Rybelsus 3 MG Tabs Generic drug: Semaglutide Take 3 mg by mouth daily. L3019B and exp: 07/2020.   SAW PALMETTO COMPLEX PO Take 540 mg by mouth daily.   spironolactone 25 MG tablet Commonly known as: ALDACTONE Take 25 mg by mouth daily.   torsemide 20 MG tablet Commonly known as: DEMADEX Take 3 tablets (60 mg total) by mouth 2 (two) times daily.   vitamin C 1000 MG tablet Take 1,000 mg by mouth 2 (two) times daily.   vitamin E 180 MG (400 UNITS) capsule Take 400 Units by mouth daily.   Zinc 50 MG Tabs Take 50 mg by mouth daily.           Outstanding Labs/Studies   BMP   Duration of Discharge Encounter   Greater than 30 minutes including physician time.  Signed, Nada Boozer, NP 07/20/2020, 3:46 PM

## 2020-07-20 NOTE — Care Management Obs Status (Signed)
MEDICARE OBSERVATION STATUS NOTIFICATION   Patient Details  Name: Chad Wilkerson MRN: [id number] Date of Birth: Nov 08, 1945   Medicare Observation Status Notification Given:  Waylan Boga, RN 07/20/2020, 5:11 PM

## 2020-07-21 ENCOUNTER — Other Ambulatory Visit: Payer: Self-pay

## 2020-07-21 ENCOUNTER — Other Ambulatory Visit (HOSPITAL_COMMUNITY): Payer: Medicare Other

## 2020-07-21 ENCOUNTER — Encounter: Payer: Self-pay | Admitting: Adult Health

## 2020-07-21 ENCOUNTER — Other Ambulatory Visit: Payer: Medicare Other

## 2020-07-21 DIAGNOSIS — E876 Hypokalemia: Secondary | ICD-10-CM

## 2020-07-22 ENCOUNTER — Encounter: Payer: Self-pay | Admitting: Adult Health

## 2020-07-22 LAB — BASIC METABOLIC PANEL
BUN/Creatinine Ratio: 19 (ref 10–24)
BUN: 23 mg/dL (ref 8–27)
CO2: 21 mmol/L (ref 20–29)
Calcium: 9.3 mg/dL (ref 8.6–10.2)
Chloride: 102 mmol/L (ref 96–106)
Creatinine, Ser: 1.18 mg/dL (ref 0.76–1.27)
GFR calc Af Amer: 70 mL/min/{1.73_m2} (ref >59)
GFR calc non Af Amer: 60 mL/min/{1.73_m2} (ref >59)
Glucose: 183 mg/dL — ABNORMAL HIGH (ref 65–99)
Potassium: 4.3 mmol/L (ref 3.5–5.2)
Sodium: 141 mmol/L (ref 134–144)

## 2020-07-23 ENCOUNTER — Telehealth: Payer: Self-pay | Admitting: Pharmacist

## 2020-07-23 ENCOUNTER — Ambulatory Visit (HOSPITAL_COMMUNITY): Payer: Medicare Other

## 2020-07-23 ENCOUNTER — Encounter: Payer: Self-pay | Admitting: Adult Health

## 2020-07-23 MED ORDER — RYBELSUS 3 MG PO TABS: 3.0000 mg | ORAL_TABLET | Freq: Every day | ORAL | 2 refills | Status: DC

## 2020-07-23 NOTE — Telephone Encounter (Signed)
Called patient to let him know he was approved for patient assistance for Rybelsus and it will be shipped in 3 weeks. Patient is unable to afford at the pharmacy and coupons do not apply for medicare.  Patient has enough tablets to last through the weekend. Will set aside a one month supply for him to pick up on Monday. Patient verbalized his understanding.

## 2020-07-25 LAB — CUP PACEART REMOTE DEVICE CHECK
Battery Remaining Longevity: 93 mo
Battery Voltage: 3.01 V
Brady Statistic RV Percent Paced: 0.22 %
Date Time Interrogation Session: 20220212001707
HighPow Impedance: 83 Ohm
Implantable Lead Implant Date: 20180625
Implantable Lead Location: 753860
Implantable Pulse Generator Implant Date: 20180625
Lead Channel Impedance Value: 304 Ohm
Lead Channel Impedance Value: 361 Ohm
Lead Channel Pacing Threshold Amplitude: 0.625 V
Lead Channel Pacing Threshold Pulse Width: 0.4 ms
Lead Channel Sensing Intrinsic Amplitude: 16.25 mV
Lead Channel Sensing Intrinsic Amplitude: 16.25 mV
Lead Channel Setting Pacing Amplitude: 2 V
Lead Channel Setting Pacing Pulse Width: 0.4 ms
Lead Channel Setting Sensing Sensitivity: 0.3 mV

## 2020-07-26 ENCOUNTER — Ambulatory Visit (INDEPENDENT_AMBULATORY_CARE_PROVIDER_SITE_OTHER): Payer: Medicare Other

## 2020-07-26 ENCOUNTER — Ambulatory Visit (HOSPITAL_COMMUNITY)
Admission: RE | Admit: 2020-07-26 | Discharge: 2020-07-26 | Disposition: A | Discharge status: Home / Self Care | Payer: Medicare Other | Source: Ambulatory Visit | Attending: Cardiology | Admitting: Cardiology

## 2020-07-26 ENCOUNTER — Other Ambulatory Visit: Payer: Self-pay

## 2020-07-26 DIAGNOSIS — I739 Peripheral vascular disease, unspecified: Secondary | ICD-10-CM | POA: Insufficient documentation

## 2020-07-26 DIAGNOSIS — I480 Paroxysmal atrial fibrillation: Secondary | ICD-10-CM

## 2020-07-28 ENCOUNTER — Telehealth: Payer: Self-pay

## 2020-07-28 ENCOUNTER — Other Ambulatory Visit: Payer: Self-pay

## 2020-07-28 ENCOUNTER — Ambulatory Visit (INDEPENDENT_AMBULATORY_CARE_PROVIDER_SITE_OTHER): Payer: Medicare Other | Admitting: Internal Medicine

## 2020-07-28 ENCOUNTER — Encounter: Payer: Self-pay | Admitting: Internal Medicine

## 2020-07-28 VITALS — BP 100/68 | HR 68 | Ht 69.0 in | Wt 223.0 lb

## 2020-07-28 DIAGNOSIS — I480 Paroxysmal atrial fibrillation: Secondary | ICD-10-CM

## 2020-07-28 DIAGNOSIS — I428 Other cardiomyopathies: Secondary | ICD-10-CM | POA: Diagnosis not present

## 2020-07-28 DIAGNOSIS — E119 Type 2 diabetes mellitus without complications: Secondary | ICD-10-CM | POA: Diagnosis not present

## 2020-07-28 DIAGNOSIS — I10 Essential (primary) hypertension: Secondary | ICD-10-CM

## 2020-07-28 DIAGNOSIS — I350 Nonrheumatic aortic (valve) stenosis: Secondary | ICD-10-CM | POA: Diagnosis not present

## 2020-07-28 DIAGNOSIS — I340 Nonrheumatic mitral (valve) insufficiency: Secondary | ICD-10-CM

## 2020-07-28 DIAGNOSIS — I5020 Unspecified systolic (congestive) heart failure: Secondary | ICD-10-CM

## 2020-07-28 MED ORDER — TORSEMIDE 100 MG PO TABS: 100.0000 mg | ORAL_TABLET | Freq: Two times a day (BID) | ORAL | 3 refills | Status: DC

## 2020-07-28 NOTE — Patient Instructions (Signed)
Medication Instructions:  Your physician has recommended you make the following change in your medication:   INCREASE: torsemide (Demadex) to 100mg  by mouth twice daily  *If you need a refill on your cardiac medications before your next appointment, please call your pharmacy*   Lab Work: IN 7-10 DAYS: BMP and MG  If you have labs (blood work) drawn today and your tests are completely normal, you will receive your results only by: 9-10 MyChart Message (if you have MyChart) OR . A paper copy in the mail If you have any lab test that is abnormal or we need to change your treatment, we will call you to review the results.   Testing/Procedures: Your physician has requested that you have an echocardiogram. Echocardiography is a painless test that uses sound waves to create images of your heart. It provides your doctor with information about the size and shape of your heart and how well your heart's chambers and valves are working. This procedure takes approximately one hour. There are no restrictions for this procedure.     Follow-Up: At [facility], you and your health needs are our priority.  As part of our continuing mission to provide you with exceptional heart care, we have created designated Provider Care Teams.  These Care Teams include your primary Cardiologist (physician) and Advanced Practice Providers (APPs -  Physician Assistants and Nurse Practitioners) who all work together to provide you with the care you need, when you need it.  We recommend signing up for the patient portal called "MyChart".  Sign up information is provided on this After Visit Summary.  MyChart is used to connect with patients for Virtual Visits (Telemedicine).  Patients are able to view lab/test results, encounter notes, upcoming appointments, etc.  Non-urgent messages can be sent to your provider as well.   To learn more about what you can do with MyChart, go to [facility].    Your next  appointment:   3 month(s)  The format for your next appointment:   In Person  Provider:   You may see [email or web address], MD or one of the following Advanced Practice Providers on your designated Care Team:    Christell Constant, PA-C  Ronie Spies, PA-C

## 2020-07-28 NOTE — Telephone Encounter (Signed)
Medication Samples have been provided to the patient.  Drug name: Reybelsus       Strength: 3mg        Qty: 1 box (30 tablets)  LOT:  Exp.Date: 07/11/2021   Dosing instructions: Take 1 tablet by mouth daily.   The patient has been instructed regarding the correct time, dose, and frequency of taking this medication, including desired effects and most common side effects.

## 2020-07-28 NOTE — Progress Notes (Signed)
Cardiology Office Note:    Date:  07/28/2020   ID:  Chad Wilkerson Birdsall, DOB 06/07/1946, MRN [id number]  PCP:  Shirline FreesNafziger, Cory, NP  [facility] Cardiologist:  Christell ConstantMahesh A Huyen Perazzo, MD  [facility] Electrophysiologist:  Steffanie Dunnameron Lambert MD  Referring MD: Shirline FreesNafziger, Cory, NP   CC: Follow up heart catheterization and admission  History of Present Illness:    Chad Wilkerson Grandberry is a 75 y.o. male with a hx of Diabetes with HTN, HFrEF 25-30% with Medtronic ICD, 1st HB, mild to moderate AS, Mild MR, Atrial fibrillation on Dofetilide, eliquis, and coreg;  OSA on BiPAP seen 04/01/20.  In interim from that visit, established care with EP [name](Lambert). Started Toresmide 60 mg BID at last visit.  In interim had planned Low dose dobutamine stress echo for LFLGAS; we were unable to safely staff this procedure- [facility]  In interim of this visit, patient had valve that was easily crossed; RA 13; PCWP 18.  Patient notes that he is doing OK.  Since last visit notes that he felt better with the liquid lasix.  Relevant interval testing or therapy include LHC and Vascular studies.  Felt good post discharge  No chest pain or pressure.  Is having some recurrent of SOB and no PND/Orthopnea.  Notes slight weight gain.  Notes increase panic from prior and wonders if his SOB is the cause.   Past Medical History:  Diagnosis Date  . Adjustment disorder   . Atrial fibrillation (HCC)   . Benign colon polyp   . CHF (congestive heart failure) (HCC)   . Diabetes mellitus without complication (HCC)   . Diverticulitis   . Essential hypertension   . Heart disease   . Hypogonadism male   . NICM (nonischemic cardiomyopathy) (HCC) 07/20/2020  . Sleep apnea   . Urine incontinence   . Vitamin D deficiency     Past Surgical History:  Procedure Laterality Date  . CHOLECYSTECTOMY    . RIGHT/LEFT HEART CATH AND CORONARY ANGIOGRAPHY N/A 07/19/2020   Procedure: RIGHT/LEFT HEART CATH AND CORONARY ANGIOGRAPHY;  Surgeon:  Kathleene HazelMcAlhany, Christopher D, MD;  Location: [facility] INVASIVE CV LAB;  Service: Cardiovascular;  Laterality: N/A;  . TONSILLECTOMY      Current Medications: Current Meds  Medication Sig  . acetaminophen (TYLENOL) 325 MG tablet Take 2 tablets (650 mg total) by mouth every 4 (four) hours as needed for headache or mild pain.  . Arginine (L-ARGININE-500) 500 MG CAPS Take 500 mg by mouth daily.  . Ascorbic Acid (VITAMIN C) 1000 MG tablet Take 1,000 mg by mouth 2 (two) times daily.  Marland Kitchen. atorvastatin (LIPITOR) 20 MG tablet Take 20 mg by mouth daily.  . B Complex Vitamins (B COMPLEX PO) Take 1 tablet by mouth daily.  . Biotin 10 MG TABS Take 10 mg by mouth daily. 10 mg - 10,000 mcg  . budesonide-formoterol (SYMBICORT) 160-4.5 MCG/ACT inhaler Inhale 2 puffs into the lungs 2 (two) times daily.  Marland Kitchen. CALCIUM PO Take 1 tablet by mouth daily.  . carvedilol (COREG) 6.25 MG tablet Take 6.25 mg by mouth 2 (two) times daily.  . cholecalciferol (VITAMIN D3) 25 MCG (1000 UNIT) tablet Take 1,000 Units by mouth daily.  . CHROMIUM PO Take 1 tablet by mouth 2 (two) times daily.  . dapagliflozin propanediol (FARXIGA) 10 MG TABS tablet Take 1 tablet (10 mg total) by mouth daily before breakfast.  . dofetilide (TIKOSYN) 125 MCG capsule Take 125 mcg by mouth every 12 (twelve) hours.  .Marland Kitchen  ELIQUIS 5 MG TABS tablet Take 5 mg by mouth 2 (two) times daily.  Marland Kitchen escitalopram (LEXAPRO) 10 MG tablet Take 1 tablet (10 mg total) by mouth daily.  Marland Kitchen levOCARNitine (L-CARNITINE) 500 MG TABS Take 500 mg by mouth daily.  . Magnesium 400 MG TABS Take 400 mg by mouth daily.  . metFORMIN (GLUCOPHAGE) 500 MG tablet Take 1-3 tablets (500-1,500 mg total) by mouth See admin instructions. Take one tablet (500 mg) by mouth daily with breakfast and three tablets (1500 mg) daily with supper  . ONETOUCH ULTRA test strip USE TO TEST BLOOD SUGAR 2 TIMES DAILY  . Potassium Chloride ER 20 MEQ TBCR Take 20 mEq by mouth 2 (two) times daily.  . Semaglutide (RYBELSUS) 3  MG TABS Take 3 mg by mouth daily. L3019B and exp: 07/2020.  . spironolactone (ALDACTONE) 25 MG tablet Take 25 mg by mouth daily.  Marland Kitchen torsemide (DEMADEX) 100 MG tablet Take 1 tablet (100 mg total) by mouth 2 (two) times daily.  . vitamin E 180 MG (400 UNITS) capsule Take 400 Units by mouth daily.  . Zinc 50 MG TABS Take 50 mg by mouth daily.  Marland Kitchen Zn-Pyg Afri-Nettle-Saw Palmet (SAW PALMETTO COMPLEX PO) Take 540 mg by mouth daily.  . [DISCONTINUED] torsemide (DEMADEX) 20 MG tablet Take 3 tablets (60 mg total) by mouth 2 (two) times daily.    Allergies:   Patient has no known allergies.   Social History   Socioeconomic History  . Marital status: Widowed    Spouse name: Not on file  . Number of children: Not on file  . Years of education: Not on file  . Highest education level: Not on file  Occupational History  . Not on file  Tobacco Use  . Smoking status: Former Games developer  . Smokeless tobacco: Never Used  Substance and Sexual Activity  . Alcohol use: Not Currently  . Drug use: Not Currently  . Sexual activity: Not on file  Other Topics Concern  . Not on file  Social History Narrative  . Not on file   Social Determinants of Health   Financial Resource Strain: Medium Risk  . Difficulty of Paying Living Expenses: Somewhat hard  Food Insecurity: No Food Insecurity  . Worried About Programme researcher, broadcasting/film/video in the Last Year: Never true  . Ran Out of Food in the Last Year: Never true  Transportation Needs: No Transportation Needs  . Lack of Transportation (Medical): No  . Lack of Transportation (Non-Medical): No  Physical Activity: Inactive  . Days of Exercise per Week: 0 days  . Minutes of Exercise per Session: 0 min  Stress: No Stress Concern Present  . Feeling of Stress : Not at all  Social Connections: Moderately Isolated  . Frequency of Communication with Friends and Family: Once a week  . Frequency of Social Gatherings with Friends and Family: More than three times a week  .  Attends Religious Services: Never  . Active Member of Clubs or Organizations: No  . Attends Banker Meetings: Never  . Marital Status: Living with partner    Family History: The patient's family history includes Asthma in his father; Heart attack in his mother and paternal grandfather; High Cholesterol in his mother; Kidney disease in his sister; Stroke in his maternal grandfather.  ROS:   Please see the history of present illness.    All other systems reviewed and are negative.  EKGs/Labs/Other Studies Reviewed:    The following studies were reviewed  today:  EKG:   SR 68 1st HB ventricular bigeminy, QTc ~ 512 07/12/20: SR rate 83 with 1st HB PR prolongation, IVCD, QRS 130, Frequent PVCs QTc 500 04/02/20: SR with 1st HB PR of 300 anterior infarct pattern and QTc 470 05/10/20 SR 1st Hb 300, occasional PVC, QTc 487 [place]  Arterial ABI and Duplex: Date: 07/26/20 Results: Summary:  Right: Resting right ankle-brachial index is within normal range.  No evidence of significant right lower extremity arterial disease. The  right toe-brachial index is normal.   Left: Resting left ankle-brachial index is within normal range.  No evidence of significant left lower extremity arterial disease. The left  toe-brachial index is normal.   Transthoracic Echocardiogram: Date: 04/23/20 Results: 1. Left ventricular ejection fraction, by estimation, is 20 to 25%. The  left ventricle has severely decreased function. The left ventricle  demonstrates global hypokinesis. The left ventricular internal cavity size  was severely dilated. Indeterminate  diastolic filling due to E-A fusion.  2. Right ventricular systolic function is moderately reduced. The right  ventricular size is normal. Mildly increased right ventricular wall  thickness. There is moderately elevated pulmonary artery systolic  pressure.  3. AV is thickened, calcified with restricted motion. By 2D imaging  appears  swverely restricted. Peak and mean gradients through the valve are  24 and 15 mm Hg respectively. Gradients probably relfect depressed LVEF.  Dimensionless index is 0.36  consistent with moderate AS. Consider TEE to furhter define .  4. Left atrial size was moderately dilated.  5. Mild mitral valve regurgitation. Moderate mitral annular  calcification.  6. There is mild dilatation of the ascending aorta, measuring 42 mm.  7. The inferior vena cava is dilated in size with <50% respiratory  variability, suggesting right atrial pressure of 15 mmHg.   Left/Right Heart Catheterizations: Date: 07/19/20 Results:  Prox RCA to Mid RCA lesion is 20% stenosed.  1st Mrg lesion is 40% stenosed.  Mid LAD lesion is 20% stenosed.   1. Mild non-obstructive CAD 2. Elevated filling pressures: RA:13, RV 64/8/14 PA 64/26 (mean 40), PCWP: 28, LV: 101/15/25  AO: 90/60 CO: 4.43 L/min  CI: 2.04  3. Moderate aortic stenosis. He has low gradients across the valve by echo and by cath. Cath: mean gradient 10.5 mmHg, peak to peak gradient 12 mmhg, AVA 1.83 cm2).  The valve was easily crossed with the J wire. Cannot exclude low flow/low gradient AS but the dimensionless index and AVA findings on echo would also argue against this being severe.   Recommendations: He is volume overloaded and dyspneic even with speaking. Will admit for diuresis with IV Lasix. He has severe LV systolic dysfunction with low gradients across the aortic valve. The valve was easily crossed with a J wire. I suspect that he has moderate aortic stenosis. (Echo with AVA over 1.0 cm2, dimensionless index 0.36). Will review his findings with our structural heart team.    Recent Labs: 03/30/2020: ALT 23 04/01/2020: NT-Pro BNP 5,001 07/20/2020: Hemoglobin 13.3; Magnesium 2.1; Platelets 213 07/21/2020: BUN 23; Creatinine, Ser 1.18; Potassium 4.3; Sodium 141  Risk Assessment/Calculations:     CHA2DS2-VASc Score = 3  This indicates a 3.2% annual  risk of stroke. The patient's score is based upon: CHF History: Yes HTN History: Yes Diabetes History: No Stroke History: No Vascular Disease History: No Age Score: 1 Gender Score: 0     Physical Exam:    VS:  BP 100/68   Pulse 68   Ht 5\' 9"  (  1.753 m)   Wt 223 lb (101.2 kg)   SpO2 95%   BMI 32.93 kg/m     Wt Readings from Last 3 Encounters:  07/28/20 223 lb (101.2 kg)  07/20/20 227 lb (103 kg)  07/12/20 232 lb (105.2 kg)    GEN: Obese well developed in no acute distress HEENT: Normal NECK: No JVD ; No carotid bruits LYMPHATICS: No lymphadenopathy CARDIAC: RRR, I/VI systolic flow murmur  rubs, gallops RESPIRATORY:  Clear to auscultation without rales, wheezing or rhonchi  ABDOMEN: Soft, non-tender distended with slight fluid wave, dullness to percussion MUSCULOSKELETAL:  No edema No deformity  SKIN: Warm and dry NEUROLOGIC:  Alert and oriented x 3 PSYCHIATRIC:  Normal affect   ASSESSMENT:    1. NICM (nonischemic cardiomyopathy) (HCC)   2. Moderate aortic stenosis   3. Mild mitral regurgitation   4. Diabetes mellitus with coincident hypertension (HCC)   5. Paroxysmal atrial fibrillation (HCC)   6. Morbid obesity (HCC)   7. Heart failure with reduced ejection fraction (HCC)    PLAN:    In order of problems listed above:  Chronic Heart Failure Reduced EF  20-25% Low Flow Low Gradient Moderate AS Mild MR Diabetes with hypertension Morbid Obesity  - NYHA class II, Stage B , hypervolemic, etiology from A fib  - Torsemide increase to 100 mg PO BID - Bmp and Mg in 7-10 days - Daily weights, and fluid restriction of < 2 L  - Continue Coreg 6.25 mg BID  - No ARNI/ARB/ACEi in the setting of relatively low BP - Continue spironolactone 25 mg   - Continue Farixga 10 mg Po daily  -Has Medtronic ICD followed by EP - Echo in 2 months to look for recovery  Paroxysmal Atrial Fibrillation CHADSVASC=3. - Continue Coreg & Tikosyn - and Eliquis - QTC today 521; EP Team  following  Three month follow up with me or APP unless new symptoms or abnormal test results warranting change in plan  Would be reasonable for  APP Follow up  Time Spent Directly with Patient:   I have spent a total of 40 minutes with the patient reviewing hospital notes, telemetry, EKGs, labs and examining the patient as well as establishing an assessment and plan that was discussed personally with the patient.  > 50% of time was spent in direct patient care discussing with patient and wife.   Medication Adjustments/Labs and Tests Ordered: Current medicines are reviewed at length with the patient today.  Concerns regarding medicines are outlined above.  Orders Placed This Encounter  Procedures  . Basic metabolic panel  . Magnesium  . EKG 12-Lead  . ECHOCARDIOGRAM COMPLETE   Meds ordered this encounter  Medications  . torsemide (DEMADEX) 100 MG tablet    Sig: Take 1 tablet (100 mg total) by mouth 2 (two) times daily.    Dispense:  180 tablet    Refill:  3    Patient Instructions  Medication Instructions:  Your physician has recommended you make the following change in your medication:   INCREASE: torsemide (Demadex) to 100mg  by mouth twice daily  *If you need a refill on your cardiac medications before your next appointment, please call your pharmacy*   Lab Work: IN 7-10 DAYS: BMP and MG  If you have labs (blood work) drawn today and your tests are completely normal, you will receive your results only by: 9-10 MyChart Message (if you have MyChart) OR . A paper copy in the mail If you have any  lab test that is abnormal or we need to change your treatment, we will call you to review the results.   Testing/Procedures: Your physician has requested that you have an echocardiogram. Echocardiography is a painless test that uses sound waves to create images of your heart. It provides your doctor with information about the size and shape of your heart and how well your heart's  chambers and valves are working. This procedure takes approximately one hour. There are no restrictions for this procedure.     Follow-Up: At [facility], you and your health needs are our priority.  As part of our continuing mission to provide you with exceptional heart care, we have created designated Provider Care Teams.  These Care Teams include your primary Cardiologist (physician) and Advanced Practice Providers (APPs -  Physician Assistants and Nurse Practitioners) who all work together to provide you with the care you need, when you need it.  We recommend signing up for the patient portal called "MyChart".  Sign up information is provided on this After Visit Summary.  MyChart is used to connect with patients for Virtual Visits (Telemedicine).  Patients are able to view lab/test results, encounter notes, upcoming appointments, etc.  Non-urgent messages can be sent to your provider as well.   To learn more about what you can do with MyChart, go to [email or web address].    Your next appointment:   3 month(s)  The format for your next appointment:   In Person  Provider:   You may see Christell Constant, MD or one of the following Advanced Practice Providers on your designated Care Team:    Ronie Spies, PA-C  Jacolyn Reedy, PA-C       Signed, Christell Constant, MD  07/28/2020 11:41 AM    [facility]

## 2020-07-29 NOTE — Progress Notes (Signed)
Remote ICD transmission.   

## 2020-07-30 ENCOUNTER — Telehealth: Payer: Self-pay | Admitting: Family Medicine

## 2020-07-30 NOTE — Telephone Encounter (Signed)
PA sent to Cover My Meds.  Waiting on a determination. Key: [id number] If denied then pt may opt to use the below information.    Patient Assistance    Eligible patients pay as a little as $10 for a 30-day prescription   Two ways for your patients to get savings and support    1. TEXT READY TO [phone]   Patients will receive co-pay savings and text messages to help them start and stay on RYBELSUS  2. VISIT [email or web address].COM    Patients can download a savings card at [email or web address] and receive personalized email support.  For commercially insured patients only. Additional eligibility and restrictions apply. Message and data rates may apply. Tell patients to check with their mobile service provider. See Terms of Use & Conditions at EntrepreneurBuilder.ch.    Learn more at [email or web address]-patients.[email or web address].

## 2020-08-02 ENCOUNTER — Institutional Professional Consult (permissible substitution): Payer: Medicare Other | Admitting: Cardiovascular Disease

## 2020-08-02 MED ORDER — BUDESONIDE-FORMOTEROL FUMARATE 160-4.5 MCG/ACT IN AERO
2.0000 | INHALATION_SPRAY | Freq: Two times a day (BID) | RESPIRATORY_TRACT | 3 refills | Status: AC
Start: 2020-08-02 — End: ?

## 2020-08-02 NOTE — Addendum Note (Signed)
Addended by: Christen Butter on: 08/02/2020 02:37 PM   Modules accepted: Orders

## 2020-08-03 ENCOUNTER — Ambulatory Visit: Payer: Medicare Other | Admitting: Internal Medicine

## 2020-08-03 NOTE — Telephone Encounter (Signed)
Left a message for a return call.

## 2020-08-03 NOTE — Telephone Encounter (Signed)
Spoke to the pt and informed him that PA has been denied.  Pt informed me that he is receiving assistance through the patient assistance program.  He will be getting his medication.  Nothing further needed.

## 2020-08-03 NOTE — Telephone Encounter (Signed)
Message from Plan Request Reference Number: [id number]. RYBELSUS TAB 3MG  is denied for not meeting the prior authorization requirement(s). Details of this decision are in the notice attached below or have been faxed to you. Appeals are not supported through ePA. Please refer to the fax case notice for appeals information and instructions.

## 2020-08-04 ENCOUNTER — Other Ambulatory Visit: Payer: Medicare Other | Admitting: *Deleted

## 2020-08-04 ENCOUNTER — Other Ambulatory Visit: Payer: Self-pay

## 2020-08-04 DIAGNOSIS — I5020 Unspecified systolic (congestive) heart failure: Secondary | ICD-10-CM

## 2020-08-05 LAB — MAGNESIUM: Magnesium: 2.5 mg/dL — ABNORMAL HIGH (ref 1.6–2.3)

## 2020-08-05 LAB — BASIC METABOLIC PANEL
BUN/Creatinine Ratio: 19 (ref 10–24)
BUN: 21 mg/dL (ref 8–27)
CO2: 23 mmol/L (ref 20–29)
Calcium: 9.4 mg/dL (ref 8.6–10.2)
Chloride: 95 mmol/L — ABNORMAL LOW (ref 96–106)
Creatinine, Ser: 1.1 mg/dL (ref 0.76–1.27)
GFR calc Af Amer: 76 mL/min/{1.73_m2} (ref >59)
GFR calc non Af Amer: 66 mL/min/{1.73_m2} (ref >59)
Glucose: 122 mg/dL — ABNORMAL HIGH (ref 65–99)
Potassium: 4.3 mmol/L (ref 3.5–5.2)
Sodium: 139 mmol/L (ref 134–144)

## 2020-08-17 ENCOUNTER — Encounter: Payer: Self-pay | Admitting: Cardiology

## 2020-08-17 ENCOUNTER — Ambulatory Visit (INDEPENDENT_AMBULATORY_CARE_PROVIDER_SITE_OTHER): Payer: Medicare Other | Admitting: Cardiology

## 2020-08-17 ENCOUNTER — Other Ambulatory Visit: Payer: Self-pay

## 2020-08-17 VITALS — BP 100/66 | HR 74 | Ht 69.0 in | Wt 230.4 lb

## 2020-08-17 DIAGNOSIS — I428 Other cardiomyopathies: Secondary | ICD-10-CM | POA: Diagnosis not present

## 2020-08-17 DIAGNOSIS — Z95810 Presence of automatic (implantable) cardiac defibrillator: Secondary | ICD-10-CM | POA: Diagnosis not present

## 2020-08-17 DIAGNOSIS — I5023 Acute on chronic systolic (congestive) heart failure: Secondary | ICD-10-CM

## 2020-08-17 DIAGNOSIS — Z79899 Other long term (current) drug therapy: Secondary | ICD-10-CM

## 2020-08-17 DIAGNOSIS — I480 Paroxysmal atrial fibrillation: Secondary | ICD-10-CM

## 2020-08-17 DIAGNOSIS — I5022 Chronic systolic (congestive) heart failure: Secondary | ICD-10-CM | POA: Diagnosis not present

## 2020-08-17 NOTE — Patient Instructions (Addendum)
Medication Instructions:  Your physician recommends that you continue on your current medications as directed. Please refer to the Current Medication list given to you today.  Labwork: None ordered.  Testing/Procedures: None ordered.  Follow-Up: Your physician wants you to follow-up in: 6 months with Dr. [name].   You will receive a reminder letter in the mail two months in advance. If you don't receive a letter, please call our office to schedule the follow-up appointment.  Remote monitoring is used to monitor your ICD from home. This monitoring reduces the number of office visits required to check your device to one time per year. It allows [place] to keep an eye on the functioning of your device to ensure it is working properly. You are scheduled for a device check from home on 10/25/2020. You may send your transmission at any time that day. If you have a wireless device, the transmission will be sent automatically. After your physician reviews your transmission, you will receive a postcard with your next transmission date.  Any Other Special Instructions Will Be Listed Below (If Applicable).  If you need a refill on your cardiac medications before your next appointment, please call your pharmacy.

## 2020-08-17 NOTE — Progress Notes (Signed)
Electrophysiology Office Follow up Visit Note:    Date:  08/17/2020   ID:  Chad Wilkerson, DOB 05-04-1946, MRN [id number]  PCP:  [name], NP  [facility] Cardiologist:  Christell Constant, MD  [facility] Electrophysiologist:  None    Interval History:    Chad Wilkerson is a 75 y.o. male who presents for a follow up visit. They were last seen in clinic April 13, 2020.  He has been doing well on dofetilide and is maintaining sinus rhythm.  He is on Eliquis for stroke prophylaxis.  He tells me he is doing well overall.  No bleeding issues on his anticoagulant.    Past Medical History:  Diagnosis Date  . Adjustment disorder   . Atrial fibrillation (HCC)   . Benign colon polyp   . CHF (congestive heart failure) (HCC)   . Diabetes mellitus without complication (HCC)   . Diverticulitis   . Essential hypertension   . Heart disease   . Hypogonadism male   . NICM (nonischemic cardiomyopathy) (HCC) 07/20/2020  . Sleep apnea   . Urine incontinence   . Vitamin D deficiency     Past Surgical History:  Procedure Laterality Date  . CHOLECYSTECTOMY    . RIGHT/LEFT HEART CATH AND CORONARY ANGIOGRAPHY N/A 07/19/2020   Procedure: RIGHT/LEFT HEART CATH AND CORONARY ANGIOGRAPHY;  Surgeon: Kathleene Hazel, MD;  Location: [facility] INVASIVE CV LAB;  Service: Cardiovascular;  Laterality: N/A;  . TONSILLECTOMY      Current Medications: Current Meds  Medication Sig  . acetaminophen (TYLENOL) 325 MG tablet Take 2 tablets (650 mg total) by mouth every 4 (four) hours as needed for headache or mild pain.  . Arginine (L-ARGININE-500) 500 MG CAPS Take 500 mg by mouth daily.  . Ascorbic Acid (VITAMIN C) 1000 MG tablet Take 1,000 mg by mouth 2 (two) times daily.  Marland Kitchen atorvastatin (LIPITOR) 20 MG tablet Take 20 mg by mouth daily.  . B Complex Vitamins (B COMPLEX PO) Take 1 tablet by mouth daily.  . Biotin 10 MG TABS Take 10 mg by mouth daily. 10 mg - 10,000 mcg  .  budesonide-formoterol (SYMBICORT) 160-4.5 MCG/ACT inhaler Inhale 2 puffs into the lungs 2 (two) times daily.  Marland Kitchen CALCIUM PO Take 1 tablet by mouth daily.  . carvedilol (COREG) 6.25 MG tablet Take 6.25 mg by mouth 2 (two) times daily.  . cholecalciferol (VITAMIN D3) 25 MCG (1000 UNIT) tablet Take 1,000 Units by mouth daily.  . CHROMIUM PO Take 1 tablet by mouth 2 (two) times daily.  . dapagliflozin propanediol (FARXIGA) 10 MG TABS tablet Take 1 tablet (10 mg total) by mouth daily before breakfast.  . dofetilide (TIKOSYN) 125 MCG capsule Take 125 mcg by mouth every 12 (twelve) hours.  Marland Kitchen ELIQUIS 5 MG TABS tablet Take 5 mg by mouth 2 (two) times daily.  Marland Kitchen escitalopram (LEXAPRO) 10 MG tablet Take 1 tablet (10 mg total) by mouth daily.  Marland Kitchen levOCARNitine (L-CARNITINE) 500 MG TABS Take 500 mg by mouth daily.  . Magnesium 400 MG TABS Take 400 mg by mouth daily.  . metFORMIN (GLUCOPHAGE) 500 MG tablet Take 1-3 tablets (500-1,500 mg total) by mouth See admin instructions. Take one tablet (500 mg) by mouth daily with breakfast and three tablets (1500 mg) daily with supper  . ONETOUCH ULTRA test strip USE TO TEST BLOOD SUGAR 2 TIMES DAILY  . Potassium Chloride ER 20 MEQ TBCR Take 20 mEq by mouth 2 (two) times daily.  Marland Kitchen  Semaglutide (RYBELSUS) 3 MG TABS Take 3 mg by mouth daily. L3019B and exp: 07/2020.  . spironolactone (ALDACTONE) 25 MG tablet Take 25 mg by mouth daily.  Marland Kitchen torsemide (DEMADEX) 100 MG tablet Take 1 tablet (100 mg total) by mouth 2 (two) times daily.  . vitamin E 180 MG (400 UNITS) capsule Take 400 Units by mouth daily.  . Zinc 50 MG TABS Take 50 mg by mouth daily.  Marland Kitchen Zn-Pyg Afri-Nettle-Saw Palmet (SAW PALMETTO COMPLEX PO) Take 540 mg by mouth daily.     Allergies:   Patient has no known allergies.   Social History   Socioeconomic History  . Marital status: Widowed    Spouse name: Not on file  . Number of children: Not on file  . Years of education: Not on file  . Highest education  level: Not on file  Occupational History  . Not on file  Tobacco Use  . Smoking status: Former Games developer  . Smokeless tobacco: Never Used  Substance and Sexual Activity  . Alcohol use: Not Currently  . Drug use: Not Currently  . Sexual activity: Not on file  Other Topics Concern  . Not on file  Social History Narrative  . Not on file   Social Determinants of Health   Financial Resource Strain: Medium Risk  . Difficulty of Paying Living Expenses: Somewhat hard  Food Insecurity: No Food Insecurity  . Worried About Programme researcher, broadcasting/film/video in the Last Year: Never true  . Ran Out of Food in the Last Year: Never true  Transportation Needs: No Transportation Needs  . Lack of Transportation (Medical): No  . Lack of Transportation (Non-Medical): No  Physical Activity: Inactive  . Days of Exercise per Week: 0 days  . Minutes of Exercise per Session: 0 min  Stress: No Stress Concern Present  . Feeling of Stress : Not at all  Social Connections: Moderately Isolated  . Frequency of Communication with Friends and Family: Once a week  . Frequency of Social Gatherings with Friends and Family: More than three times a week  . Attends Religious Services: Never  . Active Member of Clubs or Organizations: No  . Attends Banker Meetings: Never  . Marital Status: Living with partner     Family History: The patient's family history includes Asthma in his father; Heart attack in his mother and paternal grandfather; High Cholesterol in his mother; Kidney disease in his sister; Stroke in his maternal grandfather.  ROS:   Please see the history of present illness.    All other systems reviewed and are negative.  EKGs/Labs/Other Studies Reviewed:    The following studies were reviewed today:  August 17, 2020 device interrogation personally reviewed OptiVol increased Lead parameters stable No atrial fibrillation No ICD therapies delivered   EKG:  The ekg ordered today demonstrates  sinus rhythm, first-degree AV delay, QT C is 520 ms which is stable from previous and is in the setting of a QRS duration of 150 ms.  Poor R wave progression.  Recent Labs: 03/30/2020: ALT 23 04/01/2020: NT-Pro BNP 5,001 07/20/2020: Hemoglobin 13.3; Platelets 213 08/04/2020: BUN 21; Creatinine, Ser 1.10; Magnesium 2.5; Potassium 4.3; Sodium 139  Recent Lipid Panel No results found for: CHOL, TRIG, HDL, CHOLHDL, VLDL, LDLCALC, LDLDIRECT  Physical Exam:    VS:  BP 100/66   Pulse 74   Ht 5\' 9"  (1.753 m)   Wt 230 lb 6.4 oz (104.5 kg)   SpO2 92%   BMI 34.02  kg/m     Wt Readings from Last 3 Encounters:  08/17/20 230 lb 6.4 oz (104.5 kg)  07/28/20 223 lb (101.2 kg)  07/20/20 227 lb (103 kg)     GEN:  Well nourished, well developed in no acute distress HEENT: Normal NECK: No JVD; No carotid bruits LYMPHATICS: No lymphadenopathy CARDIAC: RRR, no murmurs, rubs, gallop.  Defibrillator pocket well-healed. RESPIRATORY:  Clear to auscultation without rales, wheezing or rhonchi  ABDOMEN: Soft, non-tender, non-distended MUSCULOSKELETAL:  No edema; No deformity  SKIN: Warm and dry NEUROLOGIC:  Alert and oriented x 3 PSYCHIATRIC:  Normal affect   ASSESSMENT:    1. NICM (nonischemic cardiomyopathy) (HCC)   2. Paroxysmal atrial fibrillation (HCC)   3. Chronic systolic heart failure (HCC)   4. Implantable cardioverter-defibrillator (ICD) in situ   5. Acute on chronic systolic CHF (congestive heart failure), NYHA class 3 (HCC)   6. ICD (implantable cardioverter-defibrillator) in place   7. Encounter for long-term (current) use of high-risk medication    PLAN:    In order of problems listed above:  1. Paroxysmal atrial fibrillation On Tikosyn and maintaining sinus rhythm.  QTC is around 500 when corrected for QRS duration.  On Eliquis for stroke prophylaxis. Given stability on Tikosyn regimen, we will plan to see back in 6 months with lab work at that time.  2.  Chronic systolic heart  failure secondary to nonischemic cardiomyopathy Patient with ICD in situ that is well-functioning. OptiVol index on today's device interrogation suggests volume overload. Patient tells me his diuretic regimen was recently changed.  He will need close follow-up to ensure that his volume status remains optimized.  3.  ICD in situ Device well-functioning.  Continue remote monitoring.   Medication Adjustments/Labs and Tests Ordered: Current medicines are reviewed at length with the patient today.  Concerns regarding medicines are outlined above.  Orders Placed This Encounter  Procedures  . EKG 12-Lead   No orders of the defined types were placed in this encounter.    Signed, Steffanie Dunn, MD, [facility]  08/17/2020 1:23 PM    Electrophysiology [facility]

## 2020-08-20 ENCOUNTER — Other Ambulatory Visit: Payer: Self-pay | Admitting: Adult Health

## 2020-09-05 ENCOUNTER — Encounter: Payer: Self-pay | Admitting: Adult Health

## 2020-09-06 ENCOUNTER — Other Ambulatory Visit: Payer: Self-pay | Admitting: Cardiology

## 2020-09-16 ENCOUNTER — Other Ambulatory Visit: Payer: Self-pay

## 2020-09-16 ENCOUNTER — Ambulatory Visit (INDEPENDENT_AMBULATORY_CARE_PROVIDER_SITE_OTHER): Payer: Medicare Other | Admitting: Internal Medicine

## 2020-09-16 ENCOUNTER — Encounter: Payer: Self-pay | Admitting: Internal Medicine

## 2020-09-16 VITALS — BP 100/70 | HR 69 | Ht 68.0 in | Wt 231.0 lb

## 2020-09-16 DIAGNOSIS — I428 Other cardiomyopathies: Secondary | ICD-10-CM | POA: Diagnosis not present

## 2020-09-16 DIAGNOSIS — E119 Type 2 diabetes mellitus without complications: Secondary | ICD-10-CM | POA: Diagnosis not present

## 2020-09-16 DIAGNOSIS — I10 Essential (primary) hypertension: Secondary | ICD-10-CM

## 2020-09-16 DIAGNOSIS — I480 Paroxysmal atrial fibrillation: Secondary | ICD-10-CM | POA: Diagnosis not present

## 2020-09-16 NOTE — Patient Instructions (Signed)
Medication Instructions:  Your physician recommends that you continue on your current medications as directed. Please refer to the Current Medication list given to you today.  *If you need a refill on your cardiac medications before your next appointment, please call your pharmacy*   Lab Work: NONE If you have labs (blood work) drawn today and your tests are completely normal, you will receive your results only by: Marland Kitchen MyChart Message (if you have MyChart) OR . A paper copy in the mail If you have any lab test that is abnormal or we need to change your treatment, we will call you to review the results.   Testing/Procedures: NONE   Follow-Up: At [facility], you and your health needs are our priority.  As part of our continuing mission to provide you with exceptional heart care, we have created designated Provider Care Teams.  These Care Teams include your primary Cardiologist (physician) and Advanced Practice Providers (APPs -  Physician Assistants and Nurse Practitioners) who all work together to provide you with the care you need, when you need it.  We recommend signing up for the patient portal called "MyChart".  Sign up information is provided on this After Visit Summary.  MyChart is used to connect with patients for Virtual Visits (Telemedicine).  Patients are able to view lab/test results, encounter notes, upcoming appointments, etc.  Non-urgent messages can be sent to your provider as well.   To learn more about what you can do with MyChart, go to [email or web address].    Your next appointment:   3-4 month(s)  The format for your next appointment:   In Person  Provider:   You may see Christell Constant, MD or one of the following Advanced Practice Providers on your designated Care Team:    Ronie Spies, PA-C  Jacolyn Reedy, PA-C    Other Instructions Monitor your fluid intake try to stay under 2L per day.

## 2020-09-16 NOTE — Progress Notes (Signed)
Cardiology Office Note:    Date:  09/16/2020   ID:  Chad Wilkerson, DOB 03-14-1946, MRN [id number]  PCP:  [name], NP  [facility] Cardiologist:  Christell Constant, MD  [facility] Electrophysiologist:  Steffanie Dunn MD  Referring MD: [name], NP   CC: Follow up HFrEF  History of Present Illness:    Chad Wilkerson is a 75 y.o. male with a hx of Diabetes with HTN, HFrEF 25-30% with Medtronic ICD, 1st HB, mild to moderate AS, Mild MR, Atrial fibrillation on Dofetilide, eliquis, and coreg;  OSA on BiPAP seen 04/01/20.  In interim from that visit, established care with EP [name]). Started Toresmide 60 mg BID at last visit.  In interim had planned Low dose dobutamine stress echo for LFLGAS; we were unable to safely staff this procedure- went to [facility].  In interim of this visit, patient had valve that was easily crossed; RA 13; PCWP 18.  Last seen 2/22.  In interim of this visit, patient had EP follow up:  Thought to be safe for Tikoysn continuation and in SR.  Device noted increase in Optivol.  Patient notes that he is doing OK.  Since last visit notes that he has been having som equestions about next steps from his pulmonologist  Has felt some depression at getting cardiopulmonary rehab.  No chest pain or pressure.  No SOB but DOE with exertion and exercise.  No weight gain or leg swelling. Still has some abdominal swelling. No palpitations or syncope.  This is impeding his issues with the breathing and was had concerns for years that he has COPD, was told so by his [place] DR, but unable to get all records from [place] transitioned over.   Past Medical History:  Diagnosis Date  . Adjustment disorder   . Atrial fibrillation (HCC)   . Benign colon polyp   . CHF (congestive heart failure) (HCC)   . Diabetes mellitus without complication (HCC)   . Diverticulitis   . Essential hypertension   . Heart disease   . Hypogonadism male   . NICM (nonischemic  cardiomyopathy) (HCC) 07/20/2020  . Sleep apnea   . Urine incontinence   . Vitamin D deficiency     Past Surgical History:  Procedure Laterality Date  . CHOLECYSTECTOMY    . RIGHT/LEFT HEART CATH AND CORONARY ANGIOGRAPHY N/A 07/19/2020   Procedure: RIGHT/LEFT HEART CATH AND CORONARY ANGIOGRAPHY;  Surgeon: Kathleene Hazel, MD;  Location: [facility] INV[facility]IVE CV LAB;  Service: Cardiovascular;  Laterality: N/A;  . TONSILLECTOMY      Current Medications: Current Meds  Medication Sig  . acetaminophen (TYLENOL) 325 MG tablet Take 2 tablets (650 mg total) by mouth every 4 (four) hours as needed for headache or mild pain.  . Arginine (L-ARGININE-500) 500 MG CAPS Take 500 mg by mouth daily.  . Ascorbic Acid (VITAMIN C) 1000 MG tablet Take 1,000 mg by mouth 2 (two) times daily.  Marland Kitchen atorvastatin (LIPITOR) 20 MG tablet Take 20 mg by mouth daily.  . B Complex Vitamins (B COMPLEX PO) Take 1 tablet by mouth daily.  . Biotin 10 MG TABS Take 10 mg by mouth daily. 10 mg - 10,000 mcg  . budesonide-formoterol (SYMBICORT) 160-4.5 MCG/ACT inhaler Inhale 2 puffs into the lungs 2 (two) times daily.  Marland Kitchen CALCIUM PO Take 1 tablet by mouth daily.  . carvedilol (COREG) 6.25 MG tablet Take 6.25 mg by mouth 2 (two) times daily.  . cholecalciferol (VITAMIN D3) 25 MCG (1000  UNIT) tablet Take 1,000 Units by mouth daily.  . CHROMIUM PO Take 1 tablet by mouth 2 (two) times daily.  . dapagliflozin propanediol (FARXIGA) 10 MG TABS tablet Take 1 tablet (10 mg total) by mouth daily before breakfast.  . dofetilide (TIKOSYN) 125 MCG capsule Take 125 mcg by mouth every 12 (twelve) hours.  Marland Kitchen ELIQUIS 5 MG TABS tablet Take 5 mg by mouth 2 (two) times daily.  Marland Kitchen escitalopram (LEXAPRO) 10 MG tablet TAKE 1 TABLET(10 MG) BY MOUTH DAILY  . levOCARNitine (L-CARNITINE) 500 MG TABS Take 500 mg by mouth daily.  . Magnesium 400 MG TABS Take 400 mg by mouth daily.  . metFORMIN (GLUCOPHAGE) 500 MG tablet Take 1-3 tablets (500-1,500 mg total) by  mouth See admin instructions. Take one tablet (500 mg) by mouth daily with breakfast and three tablets (1500 mg) daily with supper  . ONETOUCH ULTRA test strip USE TO TEST BLOOD SUGAR 2 TIMES DAILY  . Potassium Chloride ER 20 MEQ TBCR Take 1 tablet by mouth 2 (two) times daily.  Marland Kitchen spironolactone (ALDACTONE) 25 MG tablet Take 25 mg by mouth daily.  Marland Kitchen torsemide (DEMADEX) 100 MG tablet Take 1 tablet (100 mg total) by mouth 2 (two) times daily.  . vitamin E 180 MG (400 UNITS) capsule Take 400 Units by mouth daily.  . Zinc 50 MG TABS Take 50 mg by mouth daily.  Marland Kitchen Zn-Pyg Afri-Nettle-Saw Palmet (SAW PALMETTO COMPLEX PO) Take 540 mg by mouth daily.  . [DISCONTINUED] Semaglutide (RYBELSUS) 3 MG TABS Take 3 mg by mouth daily. [id number] and exp: 07/2020.    Allergies:   Patient has no known allergies.   Social History   Socioeconomic History  . Marital status: Widowed    Spouse name: Not on file  . Number of children: Not on file  . Years of education: Not on file  . Highest education level: Not on file  Occupational History  . Not on file  Tobacco Use  . Smoking status: Former Games developer  . Smokeless tobacco: Never Used  Substance and Sexual Activity  . Alcohol use: Not Currently  . Drug use: Not Currently  . Sexual activity: Not on file  Other Topics Concern  . Not on file  Social History Narrative  . Not on file   Social Determinants of Health   Financial Resource Strain: Medium Risk  . Difficulty of Paying Living Expenses: Somewhat hard  Food Insecurity: No Food Insecurity  . Worried About Programme researcher, broadcasting/film/video in the Last Year: Never true  . Ran Out of Food in the Last Year: Never true  Transportation Needs: No Transportation Needs  . Lack of Transportation (Medical): No  . Lack of Transportation (Non-Medical): No  Physical Activity: Inactive  . Days of Exercise per Week: 0 days  . Minutes of Exercise per Session: 0 min  Stress: No Stress Concern Present  . Feeling of Stress : Not at  all  Social Connections: Moderately Isolated  . Frequency of Communication with Friends and Family: Once a week  . Frequency of Social Gatherings with Friends and Family: More than three times a week  . Attends Religious Services: Never  . Active Member of Clubs or Organizations: No  . Attends Banker Meetings: Never  . Marital Status: Living with partner    Family History: The patient's family history includes Asthma in his father; Heart attack in his mother and paternal grandfather; High Cholesterol in his mother; Kidney disease in his sister;  Stroke in his maternal grandfather.  ROS:   Please see the history of present illness.    All other systems reviewed and are negative.  EKGs/Labs/Other Studies Reviewed:    The following studies were reviewed today:  EKG:   SR 68 1st HB ventricular bigeminy, QTc ~ 512 07/12/20: SR rate 83 with 1st HB PR prolongation, IVCD, QRS 130, Frequent PVCs QTc 500 04/02/20: SR with 1st HB PR of 300 anterior infarct pattern and QTc 470 05/10/20 SR 1st Hb 300, occasional PVC, QTc 487 [place]  Arterial ABI and Duplex: Date: 07/26/20 Results: Summary:  Right: Resting right ankle-brachial index is within normal range.  No evidence of significant right lower extremity arterial disease. The  right toe-brachial index is normal.   Left: Resting left ankle-brachial index is within normal range.  No evidence of significant left lower extremity arterial disease. The left  toe-brachial index is normal.   Transthoracic Echocardiogram: Date: 04/23/20 Results: 1. Left ventricular ejection fraction, by estimation, is 20 to 25%. The  left ventricle has severely decreased function. The left ventricle  demonstrates global hypokinesis. The left ventricular internal cavity size  was severely dilated. Indeterminate  diastolic filling due to E-A fusion.  2. Right ventricular systolic function is moderately reduced. The right  ventricular size is  normal. Mildly increased right ventricular wall  thickness. There is moderately elevated pulmonary artery systolic  pressure.  3. AV is thickened, calcified with restricted motion. By 2D imaging  appears swverely restricted. Peak and mean gradients through the valve are  24 and 15 mm Hg respectively. Gradients probably relfect depressed LVEF.  Dimensionless index is 0.36  consistent with moderate AS. Consider TEE to furhter define .  4. Left atrial size was moderately dilated.  5. Mild mitral valve regurgitation. Moderate mitral annular  calcification.  6. There is mild dilatation of the ascending aorta, measuring 42 mm.  7. The inferior vena cava is dilated in size with <50% respiratory  variability, suggesting right atrial pressure of 15 mmHg.   Left/Right Heart Catheterizations: Date: 07/19/20 Results:  Prox RCA to Mid RCA lesion is 20% stenosed.  1st Mrg lesion is 40% stenosed.  Mid LAD lesion is 20% stenosed.   1. Mild non-obstructive CAD 2. Elevated filling pressures: RA:13, RV 64/8/14 PA 64/26 (mean 40), PCWP: 28, LV: 101/15/25  AO: 90/60 CO: 4.43 L/min  CI: 2.04  3. Moderate aortic stenosis. He has low gradients across the valve by echo and by cath. Cath: mean gradient 10.5 mmHg, peak to peak gradient 12 mmhg, AVA 1.83 cm2).  The valve was easily crossed with the J wire. Cannot exclude low flow/low gradient AS but the dimensionless index and AVA findings on echo would also argue against this being severe.   Recommendations: He is volume overloaded and dyspneic even with speaking. Will admit for diuresis with IV Lasix. He has severe LV systolic dysfunction with low gradients across the aortic valve. The valve was easily crossed with a J wire. I suspect that he has moderate aortic stenosis. (Echo with AVA over 1.0 cm2, dimensionless index 0.36). Will review his findings with our structural heart team.    Recent Labs: 03/30/2020: ALT 23 04/01/2020: NT-Pro BNP  5,001 07/20/2020: Hemoglobin 13.3; Platelets 213 08/04/2020: BUN 21; Creatinine, Ser 1.10; Magnesium 2.5; Potassium 4.3; Sodium 139  Risk Assessment/Calculations:     CHA2DS2-VASc Score = 3  This indicates a 3.2% annual risk of stroke. The patient's score is based upon: CHF History: Yes HTN History: Yes  Diabetes History: No Stroke History: No Vascular Disease History: No Age Score: 1 Gender Score: 0     Physical Exam:    VS:  BP 100/70   Pulse 69   Ht 5\' 8"  (1.727 m)   Wt 231 lb (104.8 kg)   SpO2 94%   BMI 35.12 kg/m     Wt Readings from Last 3 Encounters:  09/16/20 231 lb (104.8 kg)  08/17/20 230 lb 6.4 oz (104.5 kg)  07/28/20 223 lb (101.2 kg)    GEN: Obese well developed in no acute distress HEENT: Normal NECK: No JVD ; No carotid bruits LYMPHATICS: No lymphadenopathy CARDIAC: RRR, I/VI systolic flow murmur  rubs, gallops RESPIRATORY:  Clear to auscultation without rales, wheezing or rhonchi  ABDOMEN: Soft, non-tender distended with slight fluid wave, dullness to percussion MUSCULOSKELETAL:  No edema No deformity  SKIN: Warm and dry NEUROLOGIC:  Alert and oriented x 3 PSYCHIATRIC:  Normal affect (had depressed affect recently)  ASSESSMENT:    1. Paroxysmal atrial fibrillation (HCC)   2. NICM (nonischemic cardiomyopathy) (HCC)   3. Diabetes mellitus with coincident hypertension (HCC)   4. Morbid obesity (HCC)    PLAN:    In order of problems listed above:  Chronic Heart Failure Reduced EF  20-25% Low Flow Low Gradient Moderate AS Mild MR Diabetes with hypertension Morbid Obesity  - NYHA class II, Stage B , hypervolemic, etiology from A fib (likely)  - Torsemide  100 mg PO BID  - Daily weights, and fluid restriction of < 2 L (patient is drinking have 4 L- we have worked on a plan to decrease his fluid using 07/30/20 water bottles to manage and account) - Continue Coreg 6.25 mg BID  - No ARNI/ARB/ACEi in the setting of relatively low BP - Continue  spironolactone 25 mg   - Continue Farixga 10 mg Po daily  -Has Medtronic ICD followed by EP - Echo  (09/28/20)- we will check in with the results and see if the fluid restriction help; if not will add low dose metolazone (2.5 mg PO Daily)  Paroxysmal Atrial Fibrillation CHADSVASC=4. - Continue Coreg & Tikosyn - and Eliquis  Summer/Fall follow up unless new symptoms or abnormal test results warranting change in plan  Would be reasonable for APP Follow up  Will Reach out Dr. 09/30/20 prior to May visit.  Time Spent Directly with Patient:   I have spent a total of 40 with the patient reviewing notes, imaging, EKGs, labs and examining the patient as well as establishing an assessment and plan that was discussed personally with the patient.  > 50% of time was spent in direct patient care and family and wife.   Medication Adjustments/Labs and Tests Ordered: Current medicines are reviewed at length with the patient today.  Concerns regarding medicines are outlined above.  No orders of the defined types were placed in this encounter.  No orders of the defined types were placed in this encounter.   Patient Instructions  Medication Instructions:  Your physician recommends that you continue on your current medications as directed. Please refer to the Current Medication list given to you today.  *If you need a refill on your cardiac medications before your next appointment, please call your pharmacy*   Lab Work: NONE If you have labs (blood work) drawn today and your tests are completely normal, you will receive your results only by: June MyChart Message (if you have MyChart) OR . A paper copy in the mail If  you have any lab test that is abnormal or we need to change your treatment, we will call you to review the results.   Testing/Procedures: NONE   Follow-Up: At [facility], you and your health needs are our priority.  As part of our continuing mission to provide you with  exceptional heart care, we have created designated Provider Care Teams.  These Care Teams include your primary Cardiologist (physician) and Advanced Practice Providers (APPs -  Physician Assistants and Nurse Practitioners) who all work together to provide you with the care you need, when you need it.  We recommend signing up for the patient portal called "MyChart".  Sign up information is provided on this After Visit Summary.  MyChart is used to connect with patients for Virtual Visits (Telemedicine).  Patients are able to view lab/test results, encounter notes, upcoming appointments, etc.  Non-urgent messages can be sent to your provider as well.   To learn more about what you can do with MyChart, go to [email or web address].    Your next appointment:   3-4 month(s)  The format for your next appointment:   In Person  Provider:   You may see Christell ConstantMahesh A Meka Lewan, MD or one of the following Advanced Practice Providers on your designated Care Team:    Ronie Spiesayna Dunn, PA-C  Jacolyn ReedyMichele Lenze, PA-C    Other Instructions Monitor your fluid intake try to stay under 2L per day.      Signed, Christell ConstantMahesh A Kalin Amrhein, MD  09/16/2020 12:25 PM    [facility]

## 2020-09-22 ENCOUNTER — Telehealth: Payer: Self-pay | Admitting: Pharmacist

## 2020-09-23 ENCOUNTER — Ambulatory Visit: Payer: Medicare Other

## 2020-09-28 ENCOUNTER — Other Ambulatory Visit: Payer: Self-pay

## 2020-09-28 ENCOUNTER — Encounter (HOSPITAL_COMMUNITY): Payer: Self-pay | Admitting: Internal Medicine

## 2020-09-28 ENCOUNTER — Encounter (HOSPITAL_COMMUNITY): Payer: Self-pay | Admitting: *Deleted

## 2020-09-28 ENCOUNTER — Ambulatory Visit (HOSPITAL_BASED_OUTPATIENT_CLINIC_OR_DEPARTMENT_OTHER): Payer: Medicare Other

## 2020-09-28 ENCOUNTER — Inpatient Hospital Stay (HOSPITAL_COMMUNITY)
Admission: EM | Admit: 2020-09-28 | Discharge: 2020-10-09 | DRG: 286 | Disposition: A | Discharge status: Home Health | Payer: Medicare Other | Source: Ambulatory Visit | Attending: Internal Medicine | Admitting: Internal Medicine

## 2020-09-28 ENCOUNTER — Emergency Department (HOSPITAL_COMMUNITY): Payer: Medicare Other

## 2020-09-28 DIAGNOSIS — Z888 Allergy status to other drugs, medicaments and biological substances status: Secondary | ICD-10-CM

## 2020-09-28 DIAGNOSIS — I472 Ventricular tachycardia: Secondary | ICD-10-CM | POA: Diagnosis not present

## 2020-09-28 DIAGNOSIS — I428 Other cardiomyopathies: Secondary | ICD-10-CM | POA: Diagnosis present

## 2020-09-28 DIAGNOSIS — I5020 Unspecified systolic (congestive) heart failure: Secondary | ICD-10-CM

## 2020-09-28 DIAGNOSIS — Z515 Encounter for palliative care: Secondary | ICD-10-CM | POA: Diagnosis not present

## 2020-09-28 DIAGNOSIS — Z66 Do not resuscitate: Secondary | ICD-10-CM | POA: Diagnosis present

## 2020-09-28 DIAGNOSIS — I4901 Ventricular fibrillation: Secondary | ICD-10-CM | POA: Diagnosis present

## 2020-09-28 DIAGNOSIS — F4320 Adjustment disorder, unspecified: Secondary | ICD-10-CM | POA: Diagnosis present

## 2020-09-28 DIAGNOSIS — R578 Other shock: Secondary | ICD-10-CM | POA: Diagnosis not present

## 2020-09-28 DIAGNOSIS — G4733 Obstructive sleep apnea (adult) (pediatric): Secondary | ICD-10-CM | POA: Diagnosis not present

## 2020-09-28 DIAGNOSIS — I5023 Acute on chronic systolic (congestive) heart failure: Secondary | ICD-10-CM | POA: Diagnosis not present

## 2020-09-28 DIAGNOSIS — I5043 Acute on chronic combined systolic (congestive) and diastolic (congestive) heart failure: Secondary | ICD-10-CM | POA: Diagnosis present

## 2020-09-28 DIAGNOSIS — E871 Hypo-osmolality and hyponatremia: Secondary | ICD-10-CM | POA: Diagnosis present

## 2020-09-28 DIAGNOSIS — I110 Hypertensive heart disease with heart failure: Secondary | ICD-10-CM | POA: Diagnosis not present

## 2020-09-28 DIAGNOSIS — Z01810 Encounter for preprocedural cardiovascular examination: Secondary | ICD-10-CM | POA: Diagnosis not present

## 2020-09-28 DIAGNOSIS — I480 Paroxysmal atrial fibrillation: Secondary | ICD-10-CM | POA: Diagnosis not present

## 2020-09-28 DIAGNOSIS — Z7984 Long term (current) use of oral hypoglycemic drugs: Secondary | ICD-10-CM | POA: Diagnosis not present

## 2020-09-28 DIAGNOSIS — E876 Hypokalemia: Secondary | ICD-10-CM | POA: Diagnosis present

## 2020-09-28 DIAGNOSIS — Z7189 Other specified counseling: Secondary | ICD-10-CM | POA: Diagnosis not present

## 2020-09-28 DIAGNOSIS — I2510 Atherosclerotic heart disease of native coronary artery without angina pectoris: Secondary | ICD-10-CM | POA: Diagnosis present

## 2020-09-28 DIAGNOSIS — E1169 Type 2 diabetes mellitus with other specified complication: Secondary | ICD-10-CM | POA: Diagnosis not present

## 2020-09-28 DIAGNOSIS — E119 Type 2 diabetes mellitus without complications: Secondary | ICD-10-CM | POA: Diagnosis present

## 2020-09-28 DIAGNOSIS — Z7951 Long term (current) use of inhaled steroids: Secondary | ICD-10-CM

## 2020-09-28 DIAGNOSIS — I350 Nonrheumatic aortic (valve) stenosis: Secondary | ICD-10-CM | POA: Diagnosis present

## 2020-09-28 DIAGNOSIS — Z95810 Presence of automatic (implantable) cardiac defibrillator: Secondary | ICD-10-CM | POA: Diagnosis not present

## 2020-09-28 DIAGNOSIS — Z87891 Personal history of nicotine dependence: Secondary | ICD-10-CM

## 2020-09-28 DIAGNOSIS — Z7901 Long term (current) use of anticoagulants: Secondary | ICD-10-CM

## 2020-09-28 DIAGNOSIS — Z20822 Contact with and (suspected) exposure to covid-19: Secondary | ICD-10-CM | POA: Diagnosis present

## 2020-09-28 DIAGNOSIS — Z79899 Other long term (current) drug therapy: Secondary | ICD-10-CM

## 2020-09-28 DIAGNOSIS — I509 Heart failure, unspecified: Secondary | ICD-10-CM | POA: Diagnosis not present

## 2020-09-28 DIAGNOSIS — I272 Pulmonary hypertension, unspecified: Secondary | ICD-10-CM | POA: Diagnosis present

## 2020-09-28 DIAGNOSIS — G4730 Sleep apnea, unspecified: Secondary | ICD-10-CM | POA: Diagnosis present

## 2020-09-28 DIAGNOSIS — R7989 Other specified abnormal findings of blood chemistry: Secondary | ICD-10-CM

## 2020-09-28 LAB — BRAIN NATRIURETIC PEPTIDE: B Natriuretic Peptide: 3328.4 pg/mL — ABNORMAL HIGH (ref 0.0–100.0)

## 2020-09-28 LAB — ECHOCARDIOGRAM COMPLETE
AR max vel: 0.84 cm2
AV Area VTI: 0.84 cm2
AV Area mean vel: 0.85 cm2
AV Mean grad: 9 mmHg
AV Peak grad: 17.5 mmHg
Ao pk vel: 2.09 m/s
Area-P 1/2: 4.41 cm2
MV M vel: 3.73 m/s
MV Peak grad: 55.7 mmHg
P 1/2 time: 530 msec
Radius: 0.8 cm
S' Lateral: 6.2 cm

## 2020-09-28 LAB — COMPREHENSIVE METABOLIC PANEL
ALT: 25 U/L (ref 0–44)
AST: 33 U/L (ref 15–41)
Albumin: 3.6 g/dL (ref 3.5–5.0)
Alkaline Phosphatase: 77 U/L (ref 38–126)
Anion gap: 9 (ref 5–15)
BUN: 20 mg/dL (ref 8–23)
CO2: 26 mmol/L (ref 22–32)
Calcium: 8.9 mg/dL (ref 8.9–10.3)
Chloride: 98 mmol/L (ref 98–111)
Creatinine, Ser: 1.03 mg/dL (ref 0.61–1.24)
GFR, Estimated: >60 mL/min (ref >60)
Glucose, Bld: 131 mg/dL — ABNORMAL HIGH (ref 70–99)
Potassium: 3.7 mmol/L (ref 3.5–5.1)
Sodium: 133 mmol/L — ABNORMAL LOW (ref 135–145)
Total Bilirubin: 1.5 mg/dL — ABNORMAL HIGH (ref 0.3–1.2)
Total Protein: 6.9 g/dL (ref 6.5–8.1)

## 2020-09-28 LAB — LACTIC ACID, PLASMA
Lactic Acid, Venous: 1.6 mmol/L (ref 0.5–1.9)
Lactic Acid, Venous: 1.5 mmol/L (ref 0.5–1.9)

## 2020-09-28 LAB — CBC WITH DIFFERENTIAL/PLATELET
Abs Immature Granulocytes: 0.03 10*3/uL (ref 0.00–0.07)
Basophils Absolute: 0 10*3/uL (ref 0.0–0.1)
Basophils Relative: 0 %
Eosinophils Absolute: 0.2 10*3/uL (ref 0.0–0.5)
Eosinophils Relative: 2 %
HCT: 45.8 % (ref 39.0–52.0)
Hemoglobin: 15 g/dL (ref 13.0–17.0)
Immature Granulocytes: 0 %
Lymphocytes Relative: 13 %
Lymphs Abs: 1.1 10*3/uL (ref 0.7–4.0)
MCH: 29.2 pg (ref 26.0–34.0)
MCHC: 32.8 g/dL (ref 30.0–36.0)
MCV: 89.3 fL (ref 80.0–100.0)
Monocytes Absolute: 0.8 10*3/uL (ref 0.1–1.0)
Monocytes Relative: 9 %
Neutro Abs: 6.8 10*3/uL (ref 1.7–7.7)
Neutrophils Relative %: 76 %
Platelets: 257 10*3/uL (ref 150–400)
RBC: 5.13 MIL/uL (ref 4.22–5.81)
RDW: 16.4 % — ABNORMAL HIGH (ref 11.5–15.5)
WBC: 8.9 10*3/uL (ref 4.0–10.5)
nRBC: 0 % (ref 0.0–0.2)

## 2020-09-28 LAB — RESP PANEL BY RT-PCR (FLU A&B, COVID) ARPGX2
Influenza A by PCR: NEGATIVE
Influenza B by PCR: NEGATIVE
SARS Coronavirus 2 by RT PCR: NEGATIVE

## 2020-09-28 LAB — LIPASE, BLOOD: Lipase: 59 U/L — ABNORMAL HIGH (ref 11–51)

## 2020-09-28 LAB — TROPONIN I (HIGH SENSITIVITY)
Troponin I (High Sensitivity): 26 ng/L — ABNORMAL HIGH (ref <18)
Troponin I (High Sensitivity): 28 ng/L — ABNORMAL HIGH (ref <18)

## 2020-09-28 MED ORDER — ONDANSETRON HCL 4 MG PO TABS: 4.0000 mg | ORAL_TABLET | Freq: Four times a day (QID) | ORAL | Status: DC | PRN

## 2020-09-28 MED ORDER — ONDANSETRON HCL 4 MG/2ML IJ SOLN: 4.0000 mg | Freq: Four times a day (QID) | INTRAMUSCULAR | Status: DC | PRN

## 2020-09-28 MED ORDER — MILRINONE LACTATE IN DEXTROSE 20-5 MG/100ML-% IV SOLN
0.3750 ug/kg/min | INTRAVENOUS | Status: DC
  Administered 2020-09-28 – 2020-09-29 (×2): 0.25 ug/kg/min via INTRAVENOUS
  Administered 2020-09-29 – 2020-09-30 (×3): 0.375 ug/kg/min via INTRAVENOUS
  Administered 2020-10-01: 0.25 ug/kg/min via INTRAVENOUS
  Administered 2020-10-01 (×2): 0.375 ug/kg/min via INTRAVENOUS
  Administered 2020-10-02: 0.25 ug/kg/min via INTRAVENOUS
  Administered 2020-10-03: 0.375 ug/kg/min via INTRAVENOUS
  Administered 2020-10-03: 0.25 ug/kg/min via INTRAVENOUS
  Administered 2020-10-04 – 2020-10-05 (×4): 0.375 ug/kg/min via INTRAVENOUS
  Filled 2020-09-28 (×17): qty 100

## 2020-09-28 MED ORDER — FUROSEMIDE 10 MG/ML IJ SOLN: 60.0000 mg | Freq: Once | INTRAMUSCULAR | Status: DC

## 2020-09-28 MED ORDER — FUROSEMIDE 10 MG/ML IJ SOLN
60.0000 mg | Freq: Once | INTRAMUSCULAR | Status: AC
  Administered 2020-09-28: 60 mg via INTRAVENOUS
  Filled 2020-09-28: qty 6

## 2020-09-28 MED ORDER — ACETAMINOPHEN 650 MG RE SUPP: 650.0000 mg | Freq: Four times a day (QID) | RECTAL | Status: DC | PRN

## 2020-09-28 MED ORDER — INSULIN ASPART 100 UNIT/ML ~~LOC~~ SOLN
0.0000 [IU] | Freq: Three times a day (TID) | SUBCUTANEOUS | Status: DC
  Administered 2020-09-29: 1 [IU] via SUBCUTANEOUS
  Administered 2020-09-29: 2 [IU] via SUBCUTANEOUS
  Administered 2020-09-29: 1 [IU] via SUBCUTANEOUS
  Administered 2020-09-30: 2 [IU] via SUBCUTANEOUS
  Administered 2020-09-30: 1 [IU] via SUBCUTANEOUS
  Administered 2020-10-01 – 2020-10-02 (×6): 2 [IU] via SUBCUTANEOUS
  Administered 2020-10-03: 1 [IU] via SUBCUTANEOUS
  Administered 2020-10-03: 2 [IU] via SUBCUTANEOUS
  Administered 2020-10-03: 3 [IU] via SUBCUTANEOUS
  Administered 2020-10-04: 2 [IU] via SUBCUTANEOUS
  Administered 2020-10-04: 3 [IU] via SUBCUTANEOUS
  Administered 2020-10-04: 2 [IU] via SUBCUTANEOUS

## 2020-09-28 MED ORDER — PERFLUTREN LIPID MICROSPHERE
1.0000 mL | INTRAVENOUS | Status: AC | PRN
  Administered 2020-09-28: 1 mL via INTRAVENOUS

## 2020-09-28 MED ORDER — SODIUM CHLORIDE 0.9% FLUSH
3.0000 mL | Freq: Two times a day (BID) | INTRAVENOUS | Status: DC
  Administered 2020-09-28 – 2020-10-04 (×6): 3 mL via INTRAVENOUS

## 2020-09-28 MED ORDER — MOMETASONE FURO-FORMOTEROL FUM 200-5 MCG/ACT IN AERO
2.0000 | INHALATION_SPRAY | Freq: Two times a day (BID) | RESPIRATORY_TRACT | Status: DC
  Administered 2020-09-29 – 2020-10-09 (×21): 2 via RESPIRATORY_TRACT
  Filled 2020-09-28: qty 8.8

## 2020-09-28 MED ORDER — ACETAMINOPHEN 325 MG PO TABS
650.0000 mg | ORAL_TABLET | Freq: Four times a day (QID) | ORAL | Status: DC | PRN
  Administered 2020-09-29 – 2020-10-09 (×6): 650 mg via ORAL
  Filled 2020-09-28 (×6): qty 2

## 2020-09-28 MED ORDER — APIXABAN 5 MG PO TABS
5.0000 mg | ORAL_TABLET | Freq: Two times a day (BID) | ORAL | Status: DC
  Administered 2020-09-28 – 2020-10-04 (×12): 5 mg via ORAL
  Filled 2020-09-28 (×12): qty 1

## 2020-09-28 MED ORDER — DOFETILIDE 125 MCG PO CAPS
125.0000 ug | ORAL_CAPSULE | Freq: Two times a day (BID) | ORAL | Status: DC
  Administered 2020-09-29 – 2020-10-01 (×6): 125 ug via ORAL
  Filled 2020-09-28 (×6): qty 1

## 2020-09-28 MED ORDER — ATORVASTATIN CALCIUM 10 MG PO TABS
20.0000 mg | ORAL_TABLET | Freq: Every day | ORAL | Status: DC
  Administered 2020-09-29 – 2020-10-07 (×9): 20 mg via ORAL
  Filled 2020-09-28 (×9): qty 2

## 2020-09-28 MED ORDER — ESCITALOPRAM OXALATE 10 MG PO TABS
10.0000 mg | ORAL_TABLET | Freq: Every day | ORAL | Status: DC
  Administered 2020-09-29 – 2020-10-09 (×11): 10 mg via ORAL
  Filled 2020-09-28 (×13): qty 1

## 2020-09-28 NOTE — Chronic Care Management (AMB) (Signed)
Call to speak with patient to reschedule his next CCM appointment. Patient will be following up with his PCP this month. His appointment was moved to May 18th at noon. Patient confirmed.    Berenice Bouton, [facility] Concierge [phone]

## 2020-09-28 NOTE — H&P (Signed)
History and Physical    Chad Wilkerson EXH:[id number] DOB: 1946-06-11 DOA: 09/28/2020  PCP: [name], NP  Patient coming from: Cardiology office via EMS  I have personally briefly reviewed patient's old medical records in [facility]  Chief Complaint: Shortness of breath  HPI: Chad Wilkerson is a 75 y.o. male with medical history significant for chronic combined systolic and diastolic CHF (EF less than 20%, G3 DD by TTE 09/28/2020), NICM, s/p ICD, atrial fibrillation on Eliquis and Tikosyn, T2DM, HTN, and OSA on BiPAP who presents to the ED for evaluation of shortness of breath and weightgain.  Patient reports 1 week of progressive dyspnea, DOE, orthopnea weight gain.  He was seen in follow-up in his cardiology office earlier today.  He underwent echocardiogram which showed EF <20%.  He had significant shortness of breath therefore was sent to the ED for further management.  Patient otherwise denies any chest pain, subjective fevers, chills, diaphoresis, cough, nausea, vomiting, or dysuria.  He has not seen any obvious bleeding.  ED Course:  Initial vitals showed BP 120/83, pulse 72, RR 21, temp 98.3 F, SPO2 98% on 3 L supplemental O2 via [place].  Labs show WBC 8.9, hemoglobin 15.0, platelets 257,000, sodium 133, potassium 3.7, bicarb 26, BUN 20, creatinine 1.03, serum glucose 131, BNP 3328.4, lipase 59, high-sensitivity troponin I 26 > 28.  Portable chest x-ray shows cardiomegaly with interstitial pulmonary edema, ICD in place left chest wall.  Patient was given IV Lasix 60 mg once.  Cardiology were consulted and have adjusted IV Lasix and started patient on milrinone.  The hospitalist service was consulted to admit for further evaluation and management.  Review of Systems: All systems reviewed and are negative except as documented in history of present illness above.   Past Medical History:  Diagnosis Date  . Adjustment disorder   . Atrial fibrillation (HCC)   . Benign  colon polyp   . CHF (congestive heart failure) (HCC)   . Diabetes mellitus without complication (HCC)   . Diverticulitis   . Essential hypertension   . Heart disease   . Hypogonadism male   . NICM (nonischemic cardiomyopathy) (HCC) 07/20/2020  . Sleep apnea   . Urine incontinence   . Vitamin D deficiency     Past Surgical History:  Procedure Laterality Date  . CHOLECYSTECTOMY    . RIGHT/LEFT HEART CATH AND CORONARY ANGIOGRAPHY N/A 07/19/2020   Procedure: RIGHT/LEFT HEART CATH AND CORONARY ANGIOGRAPHY;  Surgeon: Kathleene Hazel, MD;  Location: [facility] INVASIVE CV LAB;  Service: Cardiovascular;  Laterality: N/A;  . TONSILLECTOMY      Social History:  reports that he has quit smoking. He has never used smokeless tobacco. He reports previous alcohol use. He reports previous drug use.  No Known Allergies  Family History  Problem Relation Age of Onset  . Heart attack Mother   . High Cholesterol Mother   . Asthma Father   . Kidney disease Sister   . Stroke Maternal Grandfather   . Heart attack Paternal Grandfather      Prior to Admission medications   Medication Sig Start Date End Date Taking? Authorizing Provider  acetaminophen (TYLENOL) 325 MG tablet Take 2 tablets (650 mg total) by mouth every 4 (four) hours as needed for headache or mild pain. 07/20/20   Leone Brand, NP  Arginine (L-ARGININE-500) 500 MG CAPS Take 500 mg by mouth daily.    [provider]  Ascorbic Acid (VITAMIN C) 1000 MG tablet  Take 1,000 mg by mouth 2 (two) times daily.    [provider]  atorvastatin (LIPITOR) 20 MG tablet Take 20 mg by mouth daily. 01/25/20   [provider]  B Complex Vitamins (B COMPLEX PO) Take 1 tablet by mouth daily.    [provider]  Biotin 10 MG TABS Take 10 mg by mouth daily. 10 mg - 10,000 mcg    [provider]  budesonide-formoterol (SYMBICORT) 160-4.5 MCG/ACT inhaler Inhale 2 puffs into the lungs 2 (two) times daily. 08/02/20    Martina Sinnerewald, Jonathan B, MD  CALCIUM PO Take 1 tablet by mouth daily.    [provider]  carvedilol (COREG) 6.25 MG tablet Take 6.25 mg by mouth 2 (two) times daily. 01/11/20   [provider]  cholecalciferol (VITAMIN D3) 25 MCG (1000 UNIT) tablet Take 1,000 Units by mouth daily.    [provider]  CHROMIUM PO Take 1 tablet by mouth 2 (two) times daily.    [provider]  dapagliflozin propanediol (FARXIGA) 10 MG TABS tablet Take 1 tablet (10 mg total) by mouth daily before breakfast. 07/13/20   Philip AspenHernandez Acosta, Limmie PatriciaEstela Y, MD  dofetilide (TIKOSYN) 125 MCG capsule Take 125 mcg by mouth every 12 (twelve) hours. 03/26/20   [provider]  ELIQUIS 5 MG TABS tablet Take 5 mg by mouth 2 (two) times daily. 10/03/19   [provider]  escitalopram (LEXAPRO) 10 MG tablet TAKE 1 TABLET(10 MG) BY MOUTH DAILY 08/23/20   Nafziger, Kandee Keenory, NP  levOCARNitine (L-CARNITINE) 500 MG TABS Take 500 mg by mouth daily.    [provider]  Magnesium 400 MG TABS Take 400 mg by mouth daily. 05/11/20   Georgie ChardMcDaniel, Jill D, NP  metFORMIN (GLUCOPHAGE) 500 MG tablet Take 1-3 tablets (500-1,500 mg total) by mouth See admin instructions. Take one tablet (500 mg) by mouth daily with breakfast and three tablets (1500 mg) daily with supper 07/22/20   Leone BrandIngold, Laura R, NP  [facility] ULTRA test strip USE TO TEST BLOOD SUGAR 2 TIMES DAILY 06/16/20   [provider]  Potassium Chloride ER 20 MEQ TBCR Take 1 tablet by mouth 2 (two) times daily. 09/07/20   Christell Constanthandrasekhar, Mahesh A, MD  spironolactone (ALDACTONE) 25 MG tablet Take 25 mg by mouth daily. 02/03/20   [provider]  torsemide (DEMADEX) 100 MG tablet Take 1 tablet (100 mg total) by mouth 2 (two) times daily. 07/28/20   Christell Constanthandrasekhar, Mahesh A, MD  vitamin E 180 MG (400 UNITS) capsule Take 400 Units by mouth daily.    [provider]  Zinc 50 MG TABS Take 50 mg by mouth daily.    [provider]   Zn-Pyg Afri-Nettle-Saw Palmet (SAW PALMETTO COMPLEX PO) Take 540 mg by mouth daily.    [provider]    Physical Exam: Vitals:   09/28/20 1900 09/28/20 1930 09/28/20 2000 09/28/20 2030  BP: 109/78 104/81 108/88 96/66  Pulse: 70 (!) 55 72 80  Resp: 18 18 15  (!) 22  Temp:      TempSrc:      SpO2: 95% 96% 97% 97%  Weight:      Height:       Constitutional: Obese man resting in the left lateral decubitus position, NAD, calm, comfortable Eyes: PERRL, lids and conjunctivae normal ENMT: Mucous membranes are moist. Posterior pharynx clear of any exudate or lesions.Normal dentition.  Neck: normal, supple, no masses. Respiratory: Bibasilar inspiratory crackles.  Normal respiratory effort. No accessory muscle  use.  Cardiovascular: Regular rate and rhythm, no murmurs / rubs / gallops.  Trace lower extremity edema. 2+ pedal pulses. Abdomen: no tenderness, no masses palpated. No hepatosplenomegaly. Bowel sounds positive.  Musculoskeletal: no clubbing / cyanosis. No joint deformity upper and lower extremities. Good ROM, no contractures. Normal muscle tone.  Skin: no rashes, lesions, ulcers. No induration Neurologic: CN 2-12 grossly intact. Sensation intact. Strength 5/5 in all 4.  Psychiatric: Normal judgment and insight. Alert and oriented x 3. Normal mood.   Labs on Admission: I have personally reviewed following labs and imaging studies  CBC: Recent Labs  Lab 09/28/20 1612  WBC 8.9  NEUTROABS 6.8  HGB 15.0  HCT 45.8  MCV 89.3  PLT 257   Basic Metabolic Panel: Recent Labs  Lab 09/28/20 1612  NA 133*  K 3.7  CL 98  CO2 26  GLUCOSE 131*  BUN 20  CREATININE 1.03  CALCIUM 8.9   GFR: Estimated Creatinine Clearance: 75.3 mL/min (by C-G formula based on SCr of 1.03 mg/dL). Liver Function Tests: Recent Labs  Lab 09/28/20 1612  AST 33  ALT 25  ALKPHOS 77  BILITOT 1.5*  PROT 6.9  ALBUMIN 3.6   Recent Labs  Lab 09/28/20 1612  LIPASE 59*   No results for  input(s): AMMONIA in the last 168 hours. Coagulation Profile: No results for input(s): INR, PROTIME in the last 168 hours. Cardiac Enzymes: No results for input(s): CKTOTAL, CKMB, CKMBINDEX, TROPONINI in the last 168 hours. BNP (last 3 results) Recent Labs    04/01/20 1509  PROBNP 5,001*   HbA1C: No results for input(s): HGBA1C in the last 72 hours. CBG: No results for input(s): GLUCAP in the last 168 hours. Lipid Profile: No results for input(s): CHOL, HDL, LDLCALC, TRIG, CHOLHDL, LDLDIRECT in the last 72 hours. Thyroid Function Tests: No results for input(s): TSH, T4TOTAL, FREET4, T3FREE, THYROIDAB in the last 72 hours. Anemia Panel: No results for input(s): VITAMINB12, FOLATE, FERRITIN, TIBC, IRON, RETICCTPCT in the last 72 hours. Urine analysis: No results found for: COLORURINE, APPEARANCEUR, LABSPEC, PHURINE, GLUCOSEU, HGBUR, BILIRUBINUR, KETONESUR, PROTEINUR, UROBILINOGEN, NITRITE, LEUKOCYTESUR  Radiological Exams on Admission: DG Chest Port 1 View  Result Date: 09/28/2020 CLINICAL DATA:  Worsening shortness of breath. EXAM: PORTABLE CHEST 1 VIEW COMPARISON:  None. FINDINGS: Left chest wall pacemaker with single lead terminating in the right ventricle. Moderate cardiomegaly. Pulmonary vascular congestion with mild diffuse interstitial thickening. No focal consolidation, pleural effusion, or pneumothorax. No acute osseous abnormality. IMPRESSION: 1. Cardiomegaly with mild interstitial pulmonary edema. Electronically Signed   By: Obie Dredge M.D.   On: 09/28/2020 16:51   ECHOCARDIOGRAM COMPLETE  Result Date: 09/28/2020    ECHOCARDIOGRAM REPORT   Patient Name:   Chad Wilkerson Date of Exam: 09/28/2020 Medical Rec #:  [id number]         Height:       68.0 in Accession #:    [id number]        Weight:       231.0 lb Date of Birth:  11-24-1945          BSA:          2.173 m Patient Age:    74 years          BP:           98/67 mmHg Patient Gender: M                 HR:  72  bpm. Exam Location:  [address] Procedure: 2D Echo, Cardiac Doppler and Color Doppler Indications:    I50.22 CHF  History:        Patient has prior history of Echocardiogram examinations, most                 recent 04/23/2020. Risk Factors:Sleep Apnea, Diabetes and                 Hypertension.  Sonographer:    Clearence Ped RCS Referring Phys: [id number] MAHESH A CHANDRASEKHAR IMPRESSIONS  1. Left ventricular ejection fraction, by estimation, is <20%. The left ventricle has severely decreased function. The left ventricle demonstrates global hypokinesis. The left ventricular internal cavity size was severely dilated. Left ventricular diastolic parameters are consistent with Grade III diastolic dysfunction (restrictive).  2. Right ventricular systolic function is moderately reduced. The right ventricular size is mildly enlarged. There is normal pulmonary artery systolic pressure.  3. Left atrial size was severely dilated.  4. Right atrial size was severely dilated.  5. The mitral valve is grossly normal. Mild to moderate mitral valve regurgitation.  6. Tricuspid valve regurgitation is moderate to severe.  7. The aortic valve is grossly normal. Aortic valve regurgitation is not visualized.  8. Aortic dilatation noted. There is mild dilatation of the ascending aorta, measuring 41 mm. FINDINGS  Left Ventricle: Left ventricular ejection fraction, by estimation, is <20%. The left ventricle has severely decreased function. The left ventricle demonstrates global hypokinesis. The left ventricular internal cavity size was severely dilated. There is no left ventricular hypertrophy. Left ventricular diastolic parameters are consistent with Grade III diastolic dysfunction (restrictive). Right Ventricle: The right ventricular size is mildly enlarged. Right vetricular wall thickness was not well visualized. Right ventricular systolic function is moderately reduced. There is normal pulmonary artery systolic pressure. The tricuspid  regurgitant velocity is 1.82 m/s, and with an assumed right atrial pressure of 3 mmHg, the estimated right ventricular systolic pressure is 16.2 mmHg. Left Atrium: Left atrial size was severely dilated. Right Atrium: Right atrial size was severely dilated. Pericardium: There is no evidence of pericardial effusion. Mitral Valve: The mitral valve is grossly normal. There is mild thickening of the mitral valve leaflet(s). There is mild calcification of the mitral valve leaflet(s). Mild mitral annular calcification. Mild to moderate mitral valve regurgitation. Tricuspid Valve: The tricuspid valve is grossly normal. Tricuspid valve regurgitation is moderate to severe. Aortic Valve: The aortic valve is grossly normal. Aortic valve regurgitation is not visualized. Aortic regurgitation PHT measures 530 msec. Aortic valve mean gradient measures 9.0 mmHg. Aortic valve peak gradient measures 17.5 mmHg. Aortic valve area, by  VTI measures 0.84 cm. Pulmonic Valve: The pulmonic valve was normal in structure. Pulmonic valve regurgitation is trivial. Aorta: Aortic dilatation noted. There is mild dilatation of the ascending aorta, measuring 41 mm. IAS/Shunts: The atrial septum is grossly normal. Additional Comments: A device lead is visualized.  LEFT VENTRICLE PLAX 2D LVIDd:         6.90 cm  Diastology LVIDs:         6.20 cm  LV e' medial:    4.90 cm/s LV PW:         1.00 cm  LV E/e' medial:  20.3 LV IVS:        0.80 cm  LV e' lateral:   8.27 cm/s LVOT diam:     2.20 cm  LV E/e' lateral: 12.0 LV SV:         35  LV SV Index:   16 LVOT Area:     3.80 cm  RIGHT VENTRICLE RV Basal diam:  5.50 cm RV S prime:     5.00 cm/s TAPSE (M-mode): 1.9 cm RVSP:           16.2 mmHg LEFT ATRIUM              Index       RIGHT ATRIUM           Index LA diam:        5.70 cm  2.62 cm/m  RA Pressure: 3.00 mmHg LA Vol (A2C):   152.0 ml 69.96 ml/m RA Area:     28.30 cm LA Vol (A4C):   70.6 ml  32.49 ml/m RA Volume:   96.70 ml  44.50 ml/m LA Biplane  Vol: 105.0 ml 48.32 ml/m  AORTIC VALVE AV Area (Vmax):    0.84 cm AV Area (Vmean):   0.85 cm AV Area (VTI):     0.84 cm AV Vmax:           209.00 cm/s AV Vmean:          142.000 cm/s AV VTI:            0.417 m AV Peak Grad:      17.5 mmHg AV Mean Grad:      9.0 mmHg LVOT Vmax:         46.30 cm/s LVOT Vmean:        31.700 cm/s LVOT VTI:          0.092 m LVOT/AV VTI ratio: 0.22 AI PHT:            530 msec  AORTA Ao Root diam: 3.20 cm Ao Asc diam:  4.10 cm MITRAL VALVE                 TRICUSPID VALVE MV Area (PHT):               TR Peak grad:   13.2 mmHg MV Decel Time:               TR Vmax:        182.00 cm/s MR Peak grad:    55.7 mmHg   Estimated RAP:  3.00 mmHg MR Mean grad:    35.0 mmHg   RVSP:           16.2 mmHg MR Vmax:         373.00 cm/s MR Vmean:        273.0 cm/s  SHUNTS MR PISA:         4.02 cm    Systemic VTI:  0.09 m MR PISA Eff ROA: 38 mm      Systemic Diam: 2.20 cm MR PISA Radius:  0.80 cm MV E velocity: 99.40 cm/s MV A velocity: 43.30 cm/s MV E/A ratio:  2.30 Kristeen Miss MD Electronically signed by Kristeen Miss MD Signature Date/Time: 09/28/2020/3:45:27 PM    Final     EKG: Personally reviewed. Sinus rhythm with bigeminy.  Bigeminy new when compared to prior.  Assessment/Plan Principal Problem:   Acute on chronic combined systolic (congestive) and diastolic (congestive) heart failure (HCC) Active Problems:   Sleep apnea   Paroxysmal atrial fibrillation (HCC)   ICD (implantable cardioverter-defibrillator) in place   Jonnathan Birman Herro is a 75 y.o. male with medical history significant for chronic combined systolic and diastolic CHF (EF less than 20%, G3 DD by TTE 09/28/2020), NICM, s/p ICD,  atrial fibrillation on Eliquis and Tikosyn, T2DM, HTN, and OSA on BiPAP who is admitted with acute on chronic combined systolic and diastolic CHF.  Acute on chronic combined systolic and diastolic CHF s/p ICD: TTE 4/19 prior to admit showed EF <20% with G3 DD.  Cardiology following and managing  diuresis.  At risk for low output heart failure and therefore started on milrinone. -Cardiology following -On IV Lasix 120 mg twice daily -Started on milrinone -Monitor strict I/O's and daily weights  Hypotension: Holding home Coreg, spironolactone.  On milrinone as above.  Paroxysmal atrial fibrillation: Appears to be in ventricular bigeminy versus aberrant conduction.  Continue Eliquis and Tikosyn.  Holding Coreg.  Type 2 diabetes: Hold home metformin and Farxiga for now.  Place on sensitive SSI.  OSA: Continue BiPAP nightly.  DVT prophylaxis: Eliquis Code Status: DNR, confirmed with patient Family Communication: Discussed with patient, he has discussed with family Disposition Plan: From home, dispo pending further cardiac management Consults called: Cardiology Level of care: Telemetry Cardiac Admission status:  Status is: Inpatient  Remains inpatient appropriate because:Hemodynamically unstable, IV treatments appropriate due to intensity of illness or inability to take PO and Inpatient level of care appropriate due to severity of illness   Dispo: The patient is from: Home              Anticipated d/c is to: Home              Patient currently is not medically stable to d/c.   Difficult to place patient No  Darreld Mclean MD [facility]  If 7PM-7AM, please contact night-coverage [email or web address]  09/28/2020, 9:02 PM

## 2020-09-28 NOTE — Progress Notes (Unsigned)
CRITICAL VALUE STICKER  DATE & TIME NOTIFIED: 09/28/2020 @1409   MESSENGER (representative from lab): and Chanetta Marshall  MD NOTIFIED: Dr. Clearence Ped Dr Mayford Knife  TIME OF NOTIFICATION: 1409  RESPONSE: Patient transferred to ER via EMS.

## 2020-09-28 NOTE — ED Provider Notes (Signed)
[facility] EMERGENCY DEPARTMENT Provider Note   CSN: [id number] Arrival date & time: 09/28/20  1523     History Chief Complaint  Patient presents with  . Shortness of Breath    Chad Wilkerson is a 75 y.o. male.  The history is provided by the patient.  Shortness of Breath Severity:  Moderate Onset quality:  Gradual Timing:  Constant Progression:  Worsening Chronicity:  New Context comment:  SOB, sent by cards after Echo showed depressed EF and worsening respiratory symptoms Relieved by:  Nothing Worsened by:  Exertion Associated symptoms: no abdominal pain, no chest pain, no cough, no ear pain, no fever, no rash, no sore throat and no vomiting   Risk factors: no hx of PE/DVT        Past Medical History:  Diagnosis Date  . Adjustment disorder   . Atrial fibrillation (HCC)   . Benign colon polyp   . CHF (congestive heart failure) (HCC)   . Diabetes mellitus without complication (HCC)   . Diverticulitis   . Essential hypertension   . Heart disease   . Hypogonadism male   . NICM (nonischemic cardiomyopathy) (HCC) 07/20/2020  . Sleep apnea   . Urine incontinence   . Vitamin D deficiency     Patient Active Problem List   Diagnosis Date Noted  . 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 NICM (nonischemic cardiomyopathy) (HCC) 07/20/2020  . Acute on chronic systolic CHF (congestive heart failure), NYHA class 3 (HCC) 07/19/2020  . Acute on chronic systolic CHF (congestive heart failure), NYHA class 4 (HCC)   . Aortic valve stenosis   . Heart failure with reduced ejection fraction (HCC) 04/01/2020  . Atrial fibrillation (HCC) 04/01/2020  . Moderate aortic stenosis 04/01/2020  . Mild mitral regurgitation 04/01/2020  . Morbid obesity (HCC) 04/01/2020  . Sleep apnea   . Diabetes mellitus with coincident hypertension (HCC)   . Diverticulitis   . CHF (congestive heart failure) (HCC)   . Adjustment disorder     Past  Surgical History:  Procedure Laterality Date  . CHOLECYSTECTOMY    . RIGHT/LEFT HEART CATH AND CORONARY ANGIOGRAPHY N/A 07/19/2020   Procedure: RIGHT/LEFT HEART CATH AND CORONARY ANGIOGRAPHY;  Surgeon: Kathleene Hazel, MD;  Location: [facility] INVASIVE CV LAB;  Service: Cardiovascular;  Laterality: N/A;  . TONSILLECTOMY         Family History  Problem Relation Age of Onset  . Heart attack Mother   . High Cholesterol Mother   . Asthma Father   . Kidney disease Sister   . Stroke Maternal Grandfather   . Heart attack Paternal Grandfather     Social History   Tobacco Use  . Smoking status: Former Games developer  . Smokeless tobacco: Never Used  Substance Use Topics  . Alcohol use: Not Currently  . Drug use: Not Currently    Home Medications Prior to Admission medications   Medication Sig Start Date End Date Taking? Authorizing Provider  acetaminophen (TYLENOL) 325 MG tablet Take 2 tablets (650 mg total) by mouth every 4 (four) hours as needed for headache or mild pain. 07/20/20   Leone Brand, NP  Arginine (L-ARGININE-500) 500 MG CAPS Take 500 mg by mouth daily.    [provider]  Ascorbic Acid (VITAMIN C) 1000 MG tablet Take 1,000 mg by mouth 2 (two) times daily.    [provider]  atorvastatin (LIPITOR) 20 MG tablet Take 20 mg by mouth daily. 01/25/20   [provider]  B Complex Vitamins (B COMPLEX PO) Take 1 tablet by mouth daily.    [provider]  Biotin 10 MG TABS Take 10 mg by mouth daily. 10 mg - 10,000 mcg    [provider]  budesonide-formoterol (SYMBICORT) 160-4.5 MCG/ACT inhaler Inhale 2 puffs into the lungs 2 (two) times daily. 08/02/20   Martina Sinner, MD  CALCIUM PO Take 1 tablet by mouth daily.    [provider]  carvedilol (COREG) 6.25 MG tablet Take 6.25 mg by mouth 2 (two) times daily. 01/11/20   [provider]  cholecalciferol (VITAMIN D3) 25 MCG (1000 UNIT) tablet Take 1,000 Units by mouth daily.     [provider]  CHROMIUM PO Take 1 tablet by mouth 2 (two) times daily.    [provider]  dapagliflozin propanediol (FARXIGA) 10 MG TABS tablet Take 1 tablet (10 mg total) by mouth daily before breakfast. 07/13/20   Philip Aspen, Limmie Patricia, MD  dofetilide (TIKOSYN) 125 MCG capsule Take 125 mcg by mouth every 12 (twelve) hours. 03/26/20   [provider]  ELIQUIS 5 MG TABS tablet Take 5 mg by mouth 2 (two) times daily. 10/03/19   [provider]  escitalopram (LEXAPRO) 10 MG tablet TAKE 1 TABLET(10 MG) BY MOUTH DAILY 08/23/20   Nafziger, Kandee Keen, NP  levOCARNitine (L-CARNITINE) 500 MG TABS Take 500 mg by mouth daily.    [provider]  Magnesium 400 MG TABS Take 400 mg by mouth daily. 05/11/20   Georgie Chard D, NP  metFORMIN (GLUCOPHAGE) 500 MG tablet Take 1-3 tablets (500-1,500 mg total) by mouth See admin instructions. Take one tablet (500 mg) by mouth daily with breakfast and three tablets (1500 mg) daily with supper 07/22/20   Leone Brand, NP  [facility] ULTRA test strip USE TO TEST BLOOD SUGAR 2 TIMES DAILY 06/16/20   [provider]  Potassium Chloride ER 20 MEQ TBCR Take 1 tablet by mouth 2 (two) times daily. 09/07/20   Christell Constant, MD  spironolactone (ALDACTONE) 25 MG tablet Take 25 mg by mouth daily. 02/03/20   [provider]  torsemide (DEMADEX) 100 MG tablet Take 1 tablet (100 mg total) by mouth 2 (two) times daily. 07/28/20   Christell Constant, MD  vitamin E 180 MG (400 UNITS) capsule Take 400 Units by mouth daily.    [provider]  Zinc 50 MG TABS Take 50 mg by mouth daily.    [provider]  Zn-Pyg Afri-Nettle-Saw Palmet (SAW PALMETTO COMPLEX PO) Take 540 mg by mouth daily.    [provider]    Allergies    Patient has no known allergies.  Review of Systems   Review of Systems  Constitutional: Negative for chills and fever.  HENT: Negative for ear pain and sore  throat.   Eyes: Negative for pain and visual disturbance.  Respiratory: Positive for shortness of breath. Negative for cough.   Cardiovascular: Positive for leg swelling. Negative for chest pain and palpitations.  Gastrointestinal: Positive for abdominal distention. Negative for abdominal pain and vomiting.  Genitourinary: Negative for dysuria and hematuria.  Musculoskeletal: Negative for arthralgias and back pain.  Skin: Negative for color change and rash.  Neurological: Negative for seizures and syncope.  All other systems reviewed and are negative.   Physical Exam Updated Vital Signs BP (!) 81/66   Pulse 72   Temp 98.3 F (36.8 C) (Oral)   Resp (!) 25   Ht 5'  8" (1.727 m)   Wt 108.8 kg   SpO2 96%   BMI 36.46 kg/m   Physical Exam Vitals and nursing note reviewed.  Constitutional:      General: He is not in acute distress.    Appearance: He is well-developed. He is not ill-appearing.  HENT:     Head: Normocephalic and atraumatic.  Eyes:     Conjunctiva/sclera: Conjunctivae normal.     Pupils: Pupils are equal, round, and reactive to light.  Cardiovascular:     Rate and Rhythm: Normal rate and regular rhythm.     Pulses: Normal pulses.     Heart sounds: Normal heart sounds. No murmur heard.   Pulmonary:     Effort: Pulmonary effort is normal. No respiratory distress.     Breath sounds: Decreased breath sounds present.  Abdominal:     Palpations: Abdomen is soft.     Tenderness: There is no abdominal tenderness.  Musculoskeletal:     Cervical back: Normal range of motion and neck supple.     Right lower leg: Edema (trace) present.     Left lower leg: Edema (trace) present.  Skin:    General: Skin is warm and dry.     Capillary Refill: Capillary refill takes less than 2 seconds.  Neurological:     General: No focal deficit present.     Mental Status: He is alert.     ED Results / Procedures / Treatments   Labs (all labs ordered are listed, but only abnormal  results are displayed) Labs Reviewed  CBC WITH DIFFERENTIAL/PLATELET - Abnormal; Notable for the following components:      Result Value   RDW 16.4 (*)    All other components within normal limits  COMPREHENSIVE METABOLIC PANEL - Abnormal; Notable for the following components:   Sodium 133 (*)    Glucose, Bld 131 (*)    Total Bilirubin 1.5 (*)    All other components within normal limits  BRAIN NATRIURETIC PEPTIDE - Abnormal; Notable for the following components:   B Natriuretic Peptide 3,328.4 (*)    All other components within normal limits  LIPASE, BLOOD - Abnormal; Notable for the following components:   Lipase 59 (*)    All other components within normal limits  TROPONIN I (HIGH SENSITIVITY) - Abnormal; Notable for the following components:   Troponin I (High Sensitivity) 26 (*)    All other components within normal limits  RESP PANEL BY RT-PCR (FLU A&B, COVID) ARPGX2  LACTIC ACID, PLASMA  LACTIC ACID, PLASMA  TROPONIN I (HIGH SENSITIVITY)    EKG EKG Interpretation  Date/Time:  Tuesday September 28 2020 15:53:52 EDT Ventricular Rate:  82 PR Interval:  310 QRS Duration: 136 QT Interval:  448 QTC Calculation: 448 R Axis:   222 Text Interpretation: Right and left arm electrode reversal, interpretation assumes no reversal Sinus rhythm Ventricular bigeminy Prolonged PR interval Nonspecific intraventricular conduction delay Anterior infarct, old Borderline ST depression, lateral leads Confirmed by Virgina Norfolk [phone]) on 09/28/2020 5:20:15 PM   Radiology DG Chest Port 1 View  Result Date: 09/28/2020 CLINICAL DATA:  Worsening shortness of breath. EXAM: PORTABLE CHEST 1 VIEW COMPARISON:  None. FINDINGS: Left chest wall pacemaker with single lead terminating in the right ventricle. Moderate cardiomegaly. Pulmonary vascular congestion with mild diffuse interstitial thickening. No focal consolidation, pleural effusion, or pneumothorax. No acute osseous abnormality. IMPRESSION: 1.  Cardiomegaly with mild interstitial pulmonary edema. Electronically Signed   By: Obie Dredge M.D.   On:  09/28/2020 16:51   ECHOCARDIOGRAM COMPLETE  Result Date: 09/28/2020    ECHOCARDIOGRAM REPORT   Patient Name:   Chad Wilkerson Date of Exam: 09/28/2020 Medical Rec #:  [id number]         Height:       68.0 in Accession #:    [id number]        Weight:       231.0 lb Date of Birth:  Jul 01, 1945          BSA:          2.173 m Patient Age:    74 years          BP:           98/67 mmHg Patient Gender: M                 HR:           72 bpm. Exam Location:  [address] Procedure: 2D Echo, Cardiac Doppler and Color Doppler Indications:    I50.22 CHF  History:        Patient has prior history of Echocardiogram examinations, most                 recent 04/23/2020. Risk Factors:Sleep Apnea, Diabetes and                 Hypertension.  Sonographer:    Clearence Ped RCS Referring Phys: [id number] MAHESH A CHANDRASEKHAR IMPRESSIONS  1. Left ventricular ejection fraction, by estimation, is <20%. The left ventricle has severely decreased function. The left ventricle demonstrates global hypokinesis. The left ventricular internal cavity size was severely dilated. Left ventricular diastolic parameters are consistent with Grade III diastolic dysfunction (restrictive).  2. Right ventricular systolic function is moderately reduced. The right ventricular size is mildly enlarged. There is normal pulmonary artery systolic pressure.  3. Left atrial size was severely dilated.  4. Right atrial size was severely dilated.  5. The mitral valve is grossly normal. Mild to moderate mitral valve regurgitation.  6. Tricuspid valve regurgitation is moderate to severe.  7. The aortic valve is grossly normal. Aortic valve regurgitation is not visualized.  8. Aortic dilatation noted. There is mild dilatation of the ascending aorta, measuring 41 mm. FINDINGS  Left Ventricle: Left ventricular ejection fraction, by estimation, is <20%. The left  ventricle has severely decreased function. The left ventricle demonstrates global hypokinesis. The left ventricular internal cavity size was severely dilated. There is no left ventricular hypertrophy. Left ventricular diastolic parameters are consistent with Grade III diastolic dysfunction (restrictive). Right Ventricle: The right ventricular size is mildly enlarged. Right vetricular wall thickness was not well visualized. Right ventricular systolic function is moderately reduced. There is normal pulmonary artery systolic pressure. The tricuspid regurgitant velocity is 1.82 m/s, and with an assumed right atrial pressure of 3 mmHg, the estimated right ventricular systolic pressure is 16.2 mmHg. Left Atrium: Left atrial size was severely dilated. Right Atrium: Right atrial size was severely dilated. Pericardium: There is no evidence of pericardial effusion. Mitral Valve: The mitral valve is grossly normal. There is mild thickening of the mitral valve leaflet(s). There is mild calcification of the mitral valve leaflet(s). Mild mitral annular calcification. Mild to moderate mitral valve regurgitation. Tricuspid Valve: The tricuspid valve is grossly normal. Tricuspid valve regurgitation is moderate to severe. Aortic Valve: The aortic valve is grossly normal. Aortic valve regurgitation is not visualized. Aortic regurgitation PHT measures 530 msec. Aortic valve mean gradient measures 9.0 mmHg. Aortic valve  peak gradient measures 17.5 mmHg. Aortic valve area, by  VTI measures 0.84 cm. Pulmonic Valve: The pulmonic valve was normal in structure. Pulmonic valve regurgitation is trivial. Aorta: Aortic dilatation noted. There is mild dilatation of the ascending aorta, measuring 41 mm. IAS/Shunts: The atrial septum is grossly normal. Additional Comments: A device lead is visualized.  LEFT VENTRICLE PLAX 2D LVIDd:         6.90 cm  Diastology LVIDs:         6.20 cm  LV e' medial:    4.90 cm/s LV PW:         1.00 cm  LV E/e'  medial:  20.3 LV IVS:        0.80 cm  LV e' lateral:   8.27 cm/s LVOT diam:     2.20 cm  LV E/e' lateral: 12.0 LV SV:         35 LV SV Index:   16 LVOT Area:     3.80 cm  RIGHT VENTRICLE RV Basal diam:  5.50 cm RV S prime:     5.00 cm/s TAPSE (M-mode): 1.9 cm RVSP:           16.2 mmHg LEFT ATRIUM              Index       RIGHT ATRIUM           Index LA diam:        5.70 cm  2.62 cm/m  RA Pressure: 3.00 mmHg LA Vol (A2C):   152.0 ml 69.96 ml/m RA Area:     28.30 cm LA Vol (A4C):   70.6 ml  32.49 ml/m RA Volume:   96.70 ml  44.50 ml/m LA Biplane Vol: 105.0 ml 48.32 ml/m  AORTIC VALVE AV Area (Vmax):    0.84 cm AV Area (Vmean):   0.85 cm AV Area (VTI):     0.84 cm AV Vmax:           209.00 cm/s AV Vmean:          142.000 cm/s AV VTI:            0.417 m AV Peak Grad:      17.5 mmHg AV Mean Grad:      9.0 mmHg LVOT Vmax:         46.30 cm/s LVOT Vmean:        31.700 cm/s LVOT VTI:          0.092 m LVOT/AV VTI ratio: 0.22 AI PHT:            530 msec  AORTA Ao Root diam: 3.20 cm Ao Asc diam:  4.10 cm MITRAL VALVE                 TRICUSPID VALVE MV Area (PHT):               TR Peak grad:   13.2 mmHg MV Decel Time:               TR Vmax:        182.00 cm/s MR Peak grad:    55.7 mmHg   Estimated RAP:  3.00 mmHg MR Mean grad:    35.0 mmHg   RVSP:           16.2 mmHg MR Vmax:         373.00 cm/s MR Vmean:        273.0 cm/s  SHUNTS MR PISA:  4.02 cm    Systemic VTI:  0.09 m MR PISA Eff ROA: 38 mm      Systemic Diam: 2.20 cm MR PISA Radius:  0.80 cm MV E velocity: 99.40 cm/s MV A velocity: 43.30 cm/s MV E/A ratio:  2.30 Kristeen MissPhilip Nahser MD Electronically signed by Kristeen MissPhilip Nahser MD Signature Date/Time: 09/28/2020/3:45:27 PM    Final     Procedures Procedures   Medications Ordered in ED Medications  furosemide (LASIX) injection 60 mg (has no administration in time range)    ED Course  I have reviewed the triage vital signs and the nursing notes.  Pertinent labs & imaging results that were available  during my care of the patient were reviewed by me and considered in my medical decision making (see chart for details).    MDM Rules/Calculators/A&P                          Chad Wilkerson is a 75 year old male with history of A. fib status post pacemaker on Eliquis, heart failure who presents to the ED from cardiologist office with worsening heart failure symptoms.  Has had significant weight gain.  Not much peripheral edema but has a lot of swelling in the abdomen.  Has rhonchi on exam.  Vital signs are overall unremarkable.  He was put on oxygen by EMS.  He appears to be on room air but he is tachypneic.  Looks volume overloaded.  Lab work consistent with volume overload with a BNP of 3500.  Chest x-ray with signs of congestive heart failure and edema.  Troponin overall unremarkable.  Not having any chest pain.  EKG reassuring.  Did have some soft blood pressures but this is when the patient was lying on his right side.  He has normal kidney function, normal liver function.  Have low suspicion for cardiogenic shock at this time.  When patient lied back down on his back blood pressure back up in the 100s.  He is alert and mentating well.  Extremities are warm.  Talk to cardiology who will follow along but recommend admission to medicine.  We will give a dose of IV Lasix and admit for aggressive diuresis.  This chart was dictated using voice recognition software.  Despite best efforts to proofread,  errors can occur which can change the documentation meaning.    Final Clinical Impression(s) / ED Diagnoses Final diagnoses:  Acute on chronic congestive heart failure, unspecified heart failure type [facility](HCC)    Rx / DC Orders ED Discharge Orders    None       Virgina NorfolkCuratolo, Shamina Etheridge, DO 09/28/20 1909

## 2020-09-28 NOTE — ED Triage Notes (Signed)
Patient to ED for complaints of SOB from outpatient cardiologist appointment. States that he was beening seen for "months" of increasing SOB and weight gain. Was in office to get echo, has hx of EF at 22% and is now "less than that". Denies CP, dyspnea worsens on exertion. Also notes that HR has been low and reports of bigeminy on EMS ECG.

## 2020-09-28 NOTE — Consult Note (Addendum)
Cardiology Consult:   Patient ID: Chad Wilkerson MRN: [id number]; DOB: 1945-09-10   Admission date: 09/28/2020  PCP:  [name], NP   [facility]  Cardiologist:  Christell Constant, MD 09/16/2020 Advanced Practice Provider:  No care team member to display Electrophysiologist:  Lanier Prude, MD 08/17/2020  Chief Complaint:  SOB  Patient Profile:   Chad Wilkerson is a 75 y.o. 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   History of Present Illness:   Chad Wilkerson was seen by Dr Chad Wilkerson on 04/07. Wt 231 lbs, reduce water intake from 4 L >> 2 L, no med changes, ck echo, may need metolazone.  Chad Wilkerson has been weighing himself regularly.  He states his weight is up 8 pounds since he saw Dr. Izora Wilkerson .  He has cut back on the water he was drinking, but feels he is probably still drinking more than 2 L a day.  He is compliant with his medications.  He has noticed increasing dyspnea on exertion.  He has gotten to the point that he cannot walk 15 feet without having to stop for breath.  He has orthopnea, but denies PND.  He is compliant with BiPAP nightly.  He has not had lower extremity edema.  Today, after the echo, he was so short of breath that he was transferred by EMS to the ER.  In the ER, his oxygen saturation was 88%, it is improved to 92% with O2.  His respiratory rate is still increased and he has increased work of breathing.  He has not had chest pain or palpitations.  Of note, his OptiVol was very high when last checked.  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  He feels that he does a good job of looking for hidden sodium in foods and does not add salt to foods.   Past Medical History:  Diagnosis Date  . Adjustment disorder   . Atrial fibrillation (HCC)   . Benign  colon polyp   . CHF (congestive heart failure) (HCC)   . Diabetes mellitus without complication (HCC)   . Diverticulitis   . Essential hypertension   . Heart disease   . Hypogonadism male   . NICM (nonischemic cardiomyopathy) (HCC) 07/20/2020  . Sleep apnea   . Urine incontinence   . Vitamin D deficiency     Past Surgical History:  Procedure Laterality Date  . CHOLECYSTECTOMY    . RIGHT/LEFT HEART CATH AND CORONARY ANGIOGRAPHY N/A 07/19/2020   Procedure: RIGHT/LEFT HEART CATH AND CORONARY ANGIOGRAPHY;  Surgeon: Kathleene Hazel, MD;  Location: [facility] INVASIVE [facility];  Service: Cardiovascular;  Laterality: N/A;  . TONSILLECTOMY       Medications Prior to Admission: Prior to Admission medications   Medication Sig Start Date End Date Taking? Authorizing Provider  acetaminophen (TYLENOL) 325 MG tablet Take 2 tablets (650 mg total) by mouth every 4 (four) hours as needed for headache or mild pain. 07/20/20   Leone Brand, NP  Arginine (L-ARGININE-500) 500 MG CAPS Take 500 mg by mouth daily.    [provider]  Ascorbic Acid (VITAMIN C) 1000 MG tablet Take 1,000 mg by mouth 2 (two) times daily.    [provider]  atorvastatin (LIPITOR) 20 MG tablet Take 20 mg by mouth daily. 01/25/20   [provider]  B Complex Vitamins (B COMPLEX PO) Take 1 tablet by mouth daily.    [provider]  Biotin 10 MG TABS Take 10 mg by mouth daily. 10 mg - 10,000 mcg    [provider]  budesonide-formoterol (SYMBICORT) 160-4.5 MCG/ACT inhaler Inhale 2 puffs into the lungs 2 (two) times daily. 08/02/20   Martina Sinner, MD  CALCIUM PO Take 1 tablet by mouth daily.    [provider]  carvedilol (COREG) 6.25 MG tablet Take 6.25 mg by mouth 2 (two) times daily. 01/11/20   [provider]  cholecalciferol (VITAMIN D3) 25 MCG (1000 UNIT) tablet Take 1,000 Units by mouth daily.    [provider]  CHROMIUM PO Take 1 tablet by mouth 2 (two)  times daily.    [provider]  dapagliflozin propanediol (FARXIGA) 10 MG TABS tablet Take 1 tablet (10 mg total) by mouth daily before breakfast. 07/13/20   Philip Aspen, Limmie Patricia, MD  dofetilide (TIKOSYN) 125 MCG capsule Take 125 mcg by mouth every 12 (twelve) hours. 03/26/20   [provider]  ELIQUIS 5 MG TABS tablet Take 5 mg by mouth 2 (two) times daily. 10/03/19   [provider]  escitalopram (LEXAPRO) 10 MG tablet TAKE 1 TABLET(10 MG) BY MOUTH DAILY 08/23/20   Nafziger, Kandee Keen, NP  levOCARNitine (L-CARNITINE) 500 MG TABS Take 500 mg by mouth daily.    [provider]  Magnesium 400 MG TABS Take 400 mg by mouth daily. 05/11/20   Georgie Chard D, NP  metFORMIN (GLUCOPHAGE) 500 MG tablet Take 1-3 tablets (500-1,500 mg total) by mouth See admin instructions. Take one tablet (500 mg) by mouth daily with breakfast and three tablets (1500 mg) daily with supper 07/22/20   Leone Brand, NP  [facility] ULTRA test strip USE TO TEST BLOOD SUGAR 2 TIMES DAILY 06/16/20   [provider]  Potassium Chloride ER 20 MEQ TBCR Take 1 tablet by mouth 2 (two) times daily. 09/07/20   Christell Constant, MD  spironolactone (ALDACTONE) 25 MG tablet Take 25 mg by mouth daily. 02/03/20   [provider]  torsemide (DEMADEX) 100 MG tablet Take 1 tablet (100 mg total) by mouth 2 (two) times daily. 07/28/20   Christell Constant, MD  vitamin E 180 MG (400 UNITS) capsule Take 400 Units by mouth daily.    [provider]  Zinc 50 MG TABS Take 50 mg by mouth daily.    [provider]  Zn-Pyg Afri-Nettle-Saw Palmet (SAW PALMETTO COMPLEX PO) Take 540 mg by mouth daily.    [provider]     Allergies:   No Known Allergies  Social History:   Social History   Socioeconomic History  . Marital status: Widowed    Spouse name: Not on file  . Number of children: Not on file  . Years of education: Not on file  . Highest education level:  Not on file  Occupational History  . Not on file  Tobacco Use  . Smoking status: Former Games developer  . Smokeless tobacco: Never Used  Substance and Sexual Activity  . Alcohol use: Not Currently  . Drug use: Not Currently  . Sexual activity: Not on file  Other Topics Concern  . Not on file  Social History Narrative  . Not on file   Social Determinants of Health   Financial Resource Strain: Medium Risk  . Difficulty of Paying Living Expenses: Somewhat hard  Food Insecurity: No  Food Insecurity  . Worried About Programme researcher, broadcasting/film/video in the Last Year: Never true  . Ran Out of Food in the Last Year: Never true  Transportation Needs: No Transportation Needs  . Lack of Transportation (Medical): No  . Lack of Transportation (Non-Medical): No  Physical Activity: Inactive  . Days of Exercise per Week: 0 days  . Minutes of Exercise per Session: 0 min  Stress: No Stress Concern Present  . Feeling of Stress : Not at all  Social Connections: Moderately Isolated  . Frequency of Communication with Friends and Family: Once a week  . Frequency of Social Gatherings with Friends and Family: More than three times a week  . Attends Religious Services: Never  . Active Member of Clubs or Organizations: No  . Attends Banker Meetings: Never  . Marital Status: Living with partner  Intimate Partner Violence: Not At Risk  . Fear of Current or Ex-Partner: No  . Emotionally Abused: No  . Physically Abused: No  . Sexually Abused: No    Family History:   The patient's family history includes Asthma in his father; Heart attack in his mother and paternal grandfather; High Cholesterol in his mother; Kidney disease in his sister; Stroke in his maternal grandfather.   Family Status  Relation Name Status  . Mother  Deceased  . Father  Deceased  . Sister  Alive  . MGF  Deceased  . PGM  Deceased  . PGF  Deceased  . Other  Deceased     ROS:  Please see the history of present illness. All other  ROS reviewed and negative.     Physical Exam/Data:   Vitals:   09/28/20 1730 09/28/20 1800 09/28/20 1830 09/28/20 1850  BP: 113/71 100/61 92/61 (!) 81/66  Pulse: 74 (!) 46 (!) 37 72  Resp: 20 14 (!) 24 (!) 25  Temp:      TempSrc:      SpO2: 96% 96% 97% 96%  Weight:      Height:        Intake/Output Summary (Last 24 hours) at 09/28/2020 1904 Last data filed at 09/28/2020 1852 Gross per 24 hour  Intake --  Output 600 ml  Net -600 ml   Last 3 Weights 09/28/2020 09/16/2020 08/17/2020  Weight (lbs) 239 lb 12.8 oz 231 lb 230 lb 6.4 oz  Weight (kg) 108.773 kg 104.781 kg 104.509 kg     Body mass index is 36.46 kg/m.  General:  Well nourished, well developed, in moderate respiratory distress HEENT: normal for age Lymph: no adenopathy Neck: JVD 11 cm Endocrine:  No thryomegaly Vascular: No carotid bruits; 4/4 extremity pulses 2+  Cardiac:  normal S1, S2; RRR; no murmur  Lungs: Decreased breath sounds bases with some rales bilaterally, no wheezing, rhonchi   Abd: soft, nontender, no hepatomegaly  Ext: no edema Musculoskeletal:  No deformities, BUE and BLE strength weak but equal Skin: warm and dry  Neuro:  CNs 2-12 intact, no focal abnormalities noted Psych:  Normal affect    EKG:  The ECG that was done 03/19 was personally reviewed and demonstrates SR, HR 82, bigeminal PACs w/ RBBB conduction  Relevant CV Studies:  ECHO: 09/28/2020 1. Left ventricular ejection fraction, by estimation, is <20%. The left  ventricle has severely decreased function. The left ventricle demonstrates  global hypokinesis. The left ventricular internal cavity size was severely  dilated. Left ventricular diastolic parameters are consistent with Grade III diastolic dysfunction (restrictive).  2. Right  ventricular systolic function is moderately reduced. The right  ventricular size is mildly enlarged. There is normal pulmonary artery  systolic pressure.  3. Left atrial size was severely dilated.  4.  Right atrial size was severely dilated.  5. The mitral valve is grossly normal. Mild to moderate mitral valve  regurgitation.  6. Tricuspid valve regurgitation is moderate to severe.  7. The aortic valve is grossly normal. Aortic valve regurgitation is not  visualized.  8. Aortic dilatation noted. There is mild dilatation of the ascending  aorta, measuring 41 mm.   Arterial ABI and Duplex: Date: 07/26/20 Results: Summary:  Right: Resting right ankle-brachial index is within normal range.  No evidence of significant right lower extremity arterial disease. The  right toe-brachial index is normal.   Left: Resting left ankle-brachial index is within normal range.  No evidence of significant left lower extremity arterial disease. The left  toe-brachial index is normal.   Transthoracic Echocardiogram: Date: 04/23/20 Results: 1. Left ventricular ejection fraction, by estimation, is 20 to 25%. The  left ventricle has severely decreased function. The left ventricle  demonstrates global hypokinesis. The left ventricular internal cavity size  was severely dilated. Indeterminate  diastolic filling due to E-A fusion.  2. Right ventricular systolic function is moderately reduced. The right  ventricular size is normal. Mildly increased right ventricular wall  thickness. There is moderately elevated pulmonary artery systolic  pressure.  3. AV is thickened, calcified with restricted motion. By 2D imaging  appears swverely restricted. Peak and mean gradients through the valve are  24 and 15 mm Hg respectively. Gradients probably relfect depressed LVEF.  Dimensionless index is 0.36  consistent with moderate AS. Consider TEE to furhter define .  4. Left atrial size was moderately dilated.  5. Mild mitral valve regurgitation. Moderate mitral annular  calcification.  6. There is mild dilatation of the ascending aorta, measuring 42 mm.  7. The inferior vena cava is dilated in size with  <50% respiratory  variability, suggesting right atrial pressure of 15 mmHg.   Left/Right Heart Catheterizations: Date: 07/19/20 Results:  Prox RCA to Mid RCA lesion is 20% stenosed.  1st Mrg lesion is 40% stenosed.  Mid LAD lesion is 20% stenosed.  1. Mild non-obstructive CAD 2. Elevated filling pressures: RA:13, RV 64/8/14 PA 64/26 (mean 40), PCWP: 28, LV: 101/15/25 AO: 90/60 CO: 4.43 L/min CI: 2.04  3. Moderate aortic stenosis. He has low gradients across the valve by echo and by cath. Cath: mean gradient 10.5 mmHg, peak to peak gradient 12 mmhg, AVA 1.83 cm2). The valve was easily crossed with the J wire. Cannot exclude low flow/low gradient AS but the dimensionless index and AVA findings on echo would also argue against this being severe.   Recommendations: He is volume overloaded and dyspneic even with speaking. Will admit for diuresis with IV Lasix. He has severe LV systolic dysfunction with low gradients across the aortic valve. The valve was easily crossed with a J wire. I suspect that he has moderate aortic stenosis. (Echo with AVA over 1.0 cm2, dimensionless index 0.36). Will review his findings with our structural heart team.   Laboratory Data:  High Sensitivity Troponin:   Recent Labs  Lab 09/28/20 1612  TROPONINIHS 26*      Chemistry Recent Labs  Lab 09/28/20 1612  NA 133*  K 3.7  CL 98  CO2 26  GLUCOSE 131*  BUN 20  CREATININE 1.03  CALCIUM 8.9  GFRNONAA >60  ANIONGAP 9  Recent Labs  Lab 09/28/20 1612  PROT 6.9  ALBUMIN 3.6  AST 33  ALT 25  ALKPHOS 77  BILITOT 1.5*   Hematology Recent Labs  Lab 09/28/20 1612  WBC 8.9  RBC 5.13  HGB 15.0  HCT 45.8  MCV 89.3  MCH 29.2  MCHC 32.8  RDW 16.4*  PLT 257   BNP Recent Labs  Lab 09/28/20 1613  BNP 3,328.4*    DDimer No results for input(s): DDIMER in the last 168 hours.   Radiology/Studies:  [facility] Chest Port 1 View  Result Date: 09/28/2020 CLINICAL DATA:  Worsening shortness of  breath. EXAM: PORTABLE CHEST 1 VIEW COMPARISON:  None. FINDINGS: Left chest wall pacemaker with single lead terminating in the right ventricle. Moderate cardiomegaly. Pulmonary vascular congestion with mild diffuse interstitial thickening. No focal consolidation, pleural effusion, or pneumothorax. No acute osseous abnormality. IMPRESSION: 1. Cardiomegaly with mild interstitial pulmonary edema. Electronically Signed   By: Obie Dredge M.D.   On: 09/28/2020 16:51   ECHOCARDIOGRAM COMPLETE  Result Date: 09/28/2020    ECHOCARDIOGRAM REPORT   Patient Name:   LULA PORTOCARRERO Date of Exam: 09/28/2020 Medical Rec #:  [id number]         Height:       68.0 in Accession #:    [id number]        Weight:       231.0 lb Date of Birth:  [birth year]          BSA:          2.173 m Patient Age:    74 years          BP:           98/67 mmHg Patient Gender: M                 HR:           72 bpm. Exam Location:  [address] Procedure: 2D Echo, Cardiac Doppler and Color Doppler Indications:    I50.22 CHF  History:        Patient has prior history of Echocardiogram examinations, most                 recent 04/23/2020. Risk Factors:Sleep Apnea, Diabetes and                 Hypertension.  Sonographer:    Clearence Ped RCS Referring Phys: [id number] MAHESH A CHANDRASEKHAR IMPRESSIONS  1. Left ventricular ejection fraction, by estimation, is <20%. The left ventricle has severely decreased function. The left ventricle demonstrates global hypokinesis. The left ventricular internal cavity size was severely dilated. Left ventricular diastolic parameters are consistent with Grade III diastolic dysfunction (restrictive).  2. Right ventricular systolic function is moderately reduced. The right ventricular size is mildly enlarged. There is normal pulmonary artery systolic pressure.  3. Left atrial size was severely dilated.  4. Right atrial size was severely dilated.  5. The mitral valve is grossly normal. Mild to moderate mitral valve  regurgitation.  6. Tricuspid valve regurgitation is moderate to severe.  7. The aortic valve is grossly normal. Aortic valve regurgitation is not visualized.  8. Aortic dilatation noted. There is mild dilatation of the ascending aorta, measuring 41 mm. FINDINGS  Left Ventricle: Left ventricular ejection fraction, by estimation, is <20%. The left ventricle has severely decreased function. The left ventricle demonstrates global hypokinesis. The left ventricular internal cavity size was severely dilated. There is no left ventricular hypertrophy. Left ventricular diastolic parameters  are consistent with Grade III diastolic dysfunction (restrictive). Right Ventricle: The right ventricular size is mildly enlarged. Right vetricular wall thickness was not well visualized. Right ventricular systolic function is moderately reduced. There is normal pulmonary artery systolic pressure. The tricuspid regurgitant velocity is 1.82 m/s, and with an assumed right atrial pressure of 3 mmHg, the estimated right ventricular systolic pressure is 16.2 mmHg. Left Atrium: Left atrial size was severely dilated. Right Atrium: Right atrial size was severely dilated. Pericardium: There is no evidence of pericardial effusion. Mitral Valve: The mitral valve is grossly normal. There is mild thickening of the mitral valve leaflet(s). There is mild calcification of the mitral valve leaflet(s). Mild mitral annular calcification. Mild to moderate mitral valve regurgitation. Tricuspid Valve: The tricuspid valve is grossly normal. Tricuspid valve regurgitation is moderate to severe. Aortic Valve: The aortic valve is grossly normal. Aortic valve regurgitation is not visualized. Aortic regurgitation PHT measures 530 msec. Aortic valve mean gradient measures 9.0 mmHg. Aortic valve peak gradient measures 17.5 mmHg. Aortic valve area, by  VTI measures 0.84 cm. Pulmonic Valve: The pulmonic valve was normal in structure. Pulmonic valve regurgitation is  trivial. Aorta: Aortic dilatation noted. There is mild dilatation of the ascending aorta, measuring 41 mm. IAS/Shunts: The atrial septum is grossly normal. Additional Comments: A device lead is visualized.  LEFT VENTRICLE PLAX 2D LVIDd:         6.90 cm  Diastology LVIDs:         6.20 cm  LV e' medial:    4.90 cm/s LV PW:         1.00 cm  LV E/e' medial:  20.3 LV IVS:        0.80 cm  LV e' lateral:   8.27 cm/s LVOT diam:     2.20 cm  LV E/e' lateral: 12.0 LV SV:         35 LV SV Index:   16 LVOT Area:     3.80 cm  RIGHT VENTRICLE RV Basal diam:  5.50 cm RV S prime:     5.00 cm/s TAPSE (M-mode): 1.9 cm RVSP:           16.2 mmHg LEFT ATRIUM              Index       RIGHT ATRIUM           Index LA diam:        5.70 cm  2.62 cm/m  RA Pressure: 3.00 mmHg LA Vol (A2C):   152.0 ml 69.96 ml/m RA Area:     28.30 cm LA Vol (A4C):   70.6 ml  32.49 ml/m RA Volume:   96.70 ml  44.50 ml/m LA Biplane Vol: 105.0 ml 48.32 ml/m  AORTIC VALVE AV Area (Vmax):    0.84 cm AV Area (Vmean):   0.85 cm AV Area (VTI):     0.84 cm AV Vmax:           209.00 cm/s AV Vmean:          142.000 cm/s AV VTI:            0.417 m AV Peak Grad:      17.5 mmHg AV Mean Grad:      9.0 mmHg LVOT Vmax:         46.30 cm/s LVOT Vmean:        31.700 cm/s LVOT VTI:          0.092 m LVOT/AV VTI ratio:  0.22 AI PHT:            530 msec  AORTA Ao Root diam: 3.20 cm Ao Asc diam:  4.10 cm MITRAL VALVE                 TRICUSPID VALVE MV Area (PHT):               TR Peak grad:   13.2 mmHg MV Decel Time:               TR Vmax:        182.00 cm/s MR Peak grad:    55.7 mmHg   Estimated RAP:  3.00 mmHg MR Mean grad:    35.0 mmHg   RVSP:           16.2 mmHg MR Vmax:         373.00 cm/s MR Vmean:        273.0 cm/s  SHUNTS MR PISA:         4.02 cm    Systemic VTI:  0.09 m MR PISA Eff ROA: 38 mm      Systemic Diam: 2.20 cm MR PISA Radius:  0.80 cm MV E velocity: 99.40 cm/s MV A velocity: 43.30 cm/s MV E/A ratio:  2.30 Kristeen MissPhilip Nahser MD Electronically signed by Kristeen MissPhilip  Nahser MD Signature Date/Time: 09/28/2020/3:45:27 PM    Final      Assessment and Plan:   1. Acute on chronic combined CHF -His weight is up over 10 pounds from his baseline -Start IV Lasix at 120 mg IV BID -He has had 60 mg IV, give another 60 mg IV. -Follow renal function, daily weights, strict intake/output. -Get device interrogated and follow OptiVol - Blood pressure is borderline, add milrinone -Watch for low output heart failure and cardiogenic shock  2.  S/p Medtronic ICD - Need device interrogation, he has a bigeminal rhythm with right bundle morphology at times - Need to follow-up OptiVol during this hospitalization - QRS duration is widened, may need EP consult to consider upgrade to BiV device  Otherwise, per IM  Risk Assessment/Risk Scores:   New York Heart Association (NYHA) Functional Class NYHA Class IV   For questions or updates, please contact [facility] Please consult [email or web address] for contact info under     Signed, Theodore DemarkRhonda Barrett, PA-C  09/28/2020 7:04 PM   I have seen and examined the patient along with Theodore Demarkhonda Barrett, PA-C.  I have reviewed the chart, notes and new data.  I agree with PA/NP's note.  Key new complaints: Worsening shortness of breath and weight gain; not entirely sure that he is fully compliant with water and sodium restriction, although he "talks the talk". Key examination changes: Morbid obesity does limit the physical exam, but his jugular venous pulsations appear to be elevated to the angle of the jaw, lungs are clear, markedly distended abdomen, but surprisingly little peripheral edema.  Blood pressure is borderline low. Key new findings / data: Echocardiogram shows restrictive filling, chest x-ray shows interstitial lung edema, BNP is markedly elevated, Renal function is normal, mildly hyponatremic.  As of note is alternating QRS morphology on the ECG.  This may represent ventricular bigeminy with very delayed monomorphic PVCs, but  more likely is a form of aberrant conduction.  When the PR interval is long the QRS is narrower (although still more than 100 ms), when conduction happens with a shorter PR interval, there is a broader RBBB morphology.  PLAN: His blood pressure is  low and although he does not have signs of low cardiac output yet, I am concerned that this may be our next problem.  In order to facilitate diuresis we will start a low-dose of inotropes (milrinone 0.25 mcg/kilogram/minute).  He is already on a very high dose of oral loop diuretic at home which is not working.  Was administered intravenous diuretics (furosemide 120 mg IV dose).  If this is ineffective, may need to add metolazone and may be a furosemide drip.  Consider consultation with the heart failure service in the morning.  Thurmon Fair, MD, [facility] [phone] 09/28/2020, 8:20 PM

## 2020-09-28 NOTE — Progress Notes (Signed)
CRITICAL VALUE STICKER    RECEIVER (on-site recipient of call): Dr. Mayford Knife, DOD  DATE & TIME NOTIFIED: 09/28/2020 @1420   MESSENGER (representative from lab):Edwina and [name]  MD NOTIFIED: Dr. Clearence Ped  TIME OF NOTIFICATION:1420  RESPONSE: Patient discharged to the hospital via EMS.

## 2020-09-29 ENCOUNTER — Ambulatory Visit: Payer: Medicare Other | Admitting: Adult Health

## 2020-09-29 ENCOUNTER — Other Ambulatory Visit: Payer: Self-pay

## 2020-09-29 ENCOUNTER — Inpatient Hospital Stay: Payer: Self-pay

## 2020-09-29 DIAGNOSIS — I350 Nonrheumatic aortic (valve) stenosis: Secondary | ICD-10-CM

## 2020-09-29 DIAGNOSIS — I5023 Acute on chronic systolic (congestive) heart failure: Secondary | ICD-10-CM

## 2020-09-29 DIAGNOSIS — I480 Paroxysmal atrial fibrillation: Secondary | ICD-10-CM

## 2020-09-29 DIAGNOSIS — I5043 Acute on chronic combined systolic (congestive) and diastolic (congestive) heart failure: Secondary | ICD-10-CM | POA: Diagnosis not present

## 2020-09-29 LAB — BASIC METABOLIC PANEL
Anion gap: 12 (ref 5–15)
BUN: 19 mg/dL (ref 8–23)
CO2: 26 mmol/L (ref 22–32)
Calcium: 8.7 mg/dL — ABNORMAL LOW (ref 8.9–10.3)
Chloride: 97 mmol/L — ABNORMAL LOW (ref 98–111)
Creatinine, Ser: 1.09 mg/dL (ref 0.61–1.24)
GFR, Estimated: >60 mL/min (ref >60)
Glucose, Bld: 191 mg/dL — ABNORMAL HIGH (ref 70–99)
Potassium: 3.3 mmol/L — ABNORMAL LOW (ref 3.5–5.1)
Sodium: 135 mmol/L (ref 135–145)

## 2020-09-29 LAB — CBC
HCT: 43.3 % (ref 39.0–52.0)
HCT: 40.1 % (ref 39.0–52.0)
Hemoglobin: 14.2 g/dL (ref 13.0–17.0)
Hemoglobin: 13.2 g/dL (ref 13.0–17.0)
MCH: 29 pg (ref 26.0–34.0)
MCH: 29.2 pg (ref 26.0–34.0)
MCHC: 32.8 g/dL (ref 30.0–36.0)
MCHC: 32.9 g/dL (ref 30.0–36.0)
MCV: 88.4 fL (ref 80.0–100.0)
MCV: 88.7 fL (ref 80.0–100.0)
Platelets: 234 10*3/uL (ref 150–400)
Platelets: 200 10*3/uL (ref 150–400)
RBC: 4.9 MIL/uL (ref 4.22–5.81)
RBC: 4.52 MIL/uL (ref 4.22–5.81)
RDW: 16.2 % — ABNORMAL HIGH (ref 11.5–15.5)
RDW: 16.2 % — ABNORMAL HIGH (ref 11.5–15.5)
WBC: 9.7 10*3/uL (ref 4.0–10.5)
WBC: 8.2 10*3/uL (ref 4.0–10.5)
nRBC: 0 % (ref 0.0–0.2)
nRBC: 0 % (ref 0.0–0.2)

## 2020-09-29 LAB — GLUCOSE, CAPILLARY
Glucose-Capillary: 136 mg/dL — ABNORMAL HIGH (ref 70–99)
Glucose-Capillary: 154 mg/dL — ABNORMAL HIGH (ref 70–99)
Glucose-Capillary: 150 mg/dL — ABNORMAL HIGH (ref 70–99)
Glucose-Capillary: 180 mg/dL — ABNORMAL HIGH (ref 70–99)

## 2020-09-29 LAB — COOXEMETRY PANEL
Carboxyhemoglobin: 1.4 % (ref 0.5–1.5)
Methemoglobin: 0.9 % (ref 0.0–1.5)
O2 Saturation: 58.4 %
Total hemoglobin: 13.7 g/dL (ref 12.0–16.0)

## 2020-09-29 LAB — HEMOGLOBIN A1C
Hgb A1c MFr Bld: 6.6 % — ABNORMAL HIGH (ref 4.8–5.6)
Mean Plasma Glucose: 142.72 mg/dL

## 2020-09-29 LAB — MAGNESIUM
Magnesium: 2.1 mg/dL (ref 1.7–2.4)
Magnesium: 2 mg/dL (ref 1.7–2.4)

## 2020-09-29 MED ORDER — POTASSIUM CHLORIDE CRYS ER 20 MEQ PO TBCR
40.0000 meq | EXTENDED_RELEASE_TABLET | Freq: Once | ORAL | Status: AC
  Administered 2020-09-29: 40 meq via ORAL
  Filled 2020-09-29: qty 2

## 2020-09-29 MED ORDER — SORBITOL 70 % SOLN
30.0000 mL | Freq: Once | Status: AC
  Administered 2020-09-29: 30 mL via ORAL
  Filled 2020-09-29: qty 30

## 2020-09-29 MED ORDER — POLYETHYLENE GLYCOL 3350 17 G PO PACK
17.0000 g | PACK | Freq: Once | ORAL | Status: AC
  Administered 2020-09-29: 17 g via ORAL
  Filled 2020-09-29: qty 1

## 2020-09-29 MED ORDER — SODIUM CHLORIDE 0.9% FLUSH: 10.0000 mL | INTRAVENOUS | Status: DC | PRN

## 2020-09-29 MED ORDER — FUROSEMIDE 10 MG/ML IJ SOLN
80.0000 mg | Freq: Once | INTRAMUSCULAR | Status: AC
  Administered 2020-09-29: 80 mg via INTRAVENOUS
  Filled 2020-09-29: qty 8

## 2020-09-29 MED ORDER — METOLAZONE 2.5 MG PO TABS: 2.5000 mg | ORAL_TABLET | Freq: Once | ORAL | Status: DC

## 2020-09-29 MED ORDER — SPIRONOLACTONE 12.5 MG HALF TABLET
12.5000 mg | ORAL_TABLET | Freq: Every day | ORAL | Status: DC
  Administered 2020-09-29 – 2020-09-30 (×2): 12.5 mg via ORAL
  Filled 2020-09-29 (×2): qty 1

## 2020-09-29 MED ORDER — FUROSEMIDE 10 MG/ML IJ SOLN
12.0000 mg/h | INTRAVENOUS | Status: DC
  Administered 2020-09-29 – 2020-10-02 (×4): 12 mg/h via INTRAVENOUS
  Administered 2020-10-02 – 2020-10-03 (×3): 15 mg/h via INTRAVENOUS
  Administered 2020-10-04: 12 mg/h via INTRAVENOUS
  Filled 2020-09-29 (×10): qty 20

## 2020-09-29 MED ORDER — SODIUM CHLORIDE 0.9% FLUSH
10.0000 mL | Freq: Two times a day (BID) | INTRAVENOUS | Status: DC
  Administered 2020-09-29 – 2020-10-08 (×14): 10 mL

## 2020-09-29 MED ORDER — DOCUSATE SODIUM 100 MG PO CAPS
100.0000 mg | ORAL_CAPSULE | Freq: Two times a day (BID) | ORAL | Status: DC
  Administered 2020-09-29 – 2020-10-09 (×14): 100 mg via ORAL
  Filled 2020-09-29 (×18): qty 1

## 2020-09-29 MED ORDER — DAPAGLIFLOZIN PROPANEDIOL 10 MG PO TABS
10.0000 mg | ORAL_TABLET | Freq: Every day | ORAL | Status: DC
  Administered 2020-09-29 – 2020-10-09 (×11): 10 mg via ORAL
  Filled 2020-09-29 (×11): qty 1

## 2020-09-29 MED ORDER — CHLORHEXIDINE GLUCONATE CLOTH 2 % EX PADS
6.0000 | MEDICATED_PAD | Freq: Every day | CUTANEOUS | Status: DC
  Administered 2020-09-29 – 2020-10-08 (×9): 6 via TOPICAL

## 2020-09-29 MED ORDER — POLYETHYLENE GLYCOL 3350 17 G PO PACK
17.0000 g | PACK | Freq: Every day | ORAL | Status: DC | PRN
  Administered 2020-10-02: 17 g via ORAL
  Filled 2020-09-29: qty 1

## 2020-09-29 NOTE — Discharge Instructions (Addendum)
Information on my medicine - ELIQUIS® (apixaban) ° °Why was Eliquis® prescribed for you? °Eliquis® was prescribed for you to reduce the risk of a blood clot forming that can cause a stroke if you have a medical condition called atrial fibrillation (a type of irregular heartbeat). ° °What do You need to know about Eliquis® ? °Take your Eliquis® TWICE DAILY - one tablet in the morning and one tablet in the evening with or without food. If you have difficulty swallowing the tablet whole please discuss with your pharmacist how to take the medication safely. ° °Take Eliquis® exactly as prescribed by your doctor and DO NOT stop taking Eliquis® without talking to the doctor who prescribed the medication.  Stopping may increase your risk of developing a stroke.  Refill your prescription before you run out. ° °After discharge, you should have regular check-up appointments with your healthcare provider that is prescribing your Eliquis®.  In the future your dose may need to be changed if your kidney function or weight changes by a significant amount or as you get older. ° °What do you do if you miss a dose? °If you miss a dose, take it as soon as you remember on the same day and resume taking twice daily.  Do not take more than one dose of ELIQUIS at the same time to make up a missed dose. ° °Important Safety Information °A possible side effect of Eliquis® is bleeding. You should call your healthcare provider right away if you experience any of the following: °? Bleeding from an injury or your nose that does not stop. °? Unusual colored urine (red or dark brown) or unusual colored stools (red or black). °? Unusual bruising for unknown reasons. °? A serious fall or if you hit your head (even if there is no bleeding). ° °Some medicines may interact with Eliquis® and might increase your risk of bleeding or clotting while on Eliquis®. To help avoid this, consult your healthcare provider or pharmacist prior to using any new  prescription or non-prescription medications, including herbals, vitamins, non-steroidal anti-inflammatory drugs (NSAIDs) and supplements. ° °This website has more information on Eliquis® (apixaban): http://[email or web address].com/eliquis/home ° °Heart Healthy, Consistent Carbohydrate Nutrition Therapy  ° °A heart-healthy and consistent carbohydrate diet is recommended to manage heart disease and diabetes. °To follow a heart-healthy and consistent carbohydrate diet, °• Eat a balanced diet with whole grains, fruits and vegetables, and lean protein sources.  °• Choose heart-healthy unsaturated fats. Limit saturated fats, trans fats, and cholesterol intake. Eat more plant-based or vegetarian meals using beans and soy foods for protein.  °• Eat whole, unprocessed foods to limit the amount of sodium (salt) you eat.  °• Choose a consistent amount of carbohydrate at each meal and snack. Limit refined carbohydrates especially sugar, sweets and sugar-sweetened beverages.  °• If you drink alcohol, do so in moderation: one serving per day (women) and two servings per day (men). °o One serving is equivalent to 12 ounces beer, 5 ounces wine, or 1.5 ounces distilled spirits ° °Tips °Tips for Choosing Heart-Healthy Fats °Choose lean protein and low-fat dairy foods to reduce saturated fat intake. °• Saturated fat is usually found in animal-based protein and is associated with certain health risks. Saturated fat is the biggest contributor to raise low-density lipoprotein (LDL) cholesterol levels. Research shows that limiting saturated fat lowers unhealthy cholesterol levels. Eat no more than 7% of your total calories each day from saturated fat. Ask your RDN to help you determine how much saturated fat   is right for you. °• There are many foods that do not contain large amounts of saturated fats. Swapping these foods to replace foods high in saturated fats will help you limit the saturated fat you eat and improve your cholesterol levels. You  can also try eating more plant-based or vegetarian meals. °Instead of… Try:  °Whole milk, cheese, yogurt, and ice cream 1% or skim milk, low-fat cheese, non-fat yogurt, and low-fat ice cream  °Fatty, marbled beef and pork Lean beef, pork, or venison  °Poultry with skin Poultry without skin  °Butter, stick margarine Reduced-fat, whipped, or liquid spreads  °Coconut oil, palm oil Liquid vegetable oils: corn, canola, olive, soybean and safflower oils  ° °Avoid foods that contain trans fats. °• Trans fats increase levels of LDL-cholesterol. Hydrogenated fat in processed foods is the main source of trans fats in foods.  °• Trans fats can be found in stick margarine, shortening, processed sweets, baked goods, some fried foods, and packaged foods made with hydrogenated oils. Avoid foods with “partially hydrogenated oil” on the ingredient list such as: cookies, pastries, baked goods, biscuits, crackers, microwave popcorn, and frozen dinners. °Choose foods with heart healthy fats. °• Polyunsaturated and monounsaturated fat are unsaturated fats that may help lower your blood cholesterol level when used in place of saturated fat in your diet. °• Ask your RDN about taking a dietary supplement with plant sterols and stanols to help lower your cholesterol level. °• Research shows that substituting saturated fats with unsaturated fats is beneficial to cholesterol levels. Try these easy swaps: °Instead of… Try:  °Butter, stick margarine, or solid shortening Reduced-fat, whipped, or liquid spreads  °Beef, pork, or poultry with skin Fish and seafood  °Chips, crackers, snack foods Raw or unsalted nuts and seeds or nut butters °Hummus with vegetables °Avocado on toast  °Coconut oil, palm oil Liquid vegetable oils: corn, canola, olive, soybean and safflower oils  °Limit the amount of cholesterol you eat to less than 200 milligrams per day. °• Cholesterol is a substance carried through the bloodstream via lipoproteins, which are known as  “transporters” of fat. Some body functions need cholesterol to work properly, but too much cholesterol in the bloodstream can damage arteries and build up blood vessel linings (which can lead to heart attack and stroke). You should eat less than 200 milligrams cholesterol per day. °• People respond differently to eating cholesterol. There is no test available right now that can figure out which people will respond more to dietary cholesterol and which will respond less. For individuals with high intake of dietary cholesterol, different types of increase (none, small, moderate, large) in LDL-cholesterol levels are all possible.  °• Food sources of cholesterol include egg yolks and organ meats such as liver, gizzards. Limit egg yolks to two to four per week and avoid organ meats like liver and gizzards to control cholesterol intake. °Tips for Choosing Heart-Healthy Carbohydrates °Consume a consistent amount of carbohydrate °• It is important to eat foods with carbohydrates in moderation because they impact your blood glucose level. Carbohydrates can be found in many foods such as: °• Grains (breads, crackers, rice, pasta, and cereals)  °• Starchy Vegetables (potatoes, corn, and peas)  °• Beans and legumes  °• Milk, soy milk, and yogurt  °• Fruit and fruit juice  °• Sweets (cakes, cookies, ice cream, jam and jelly) °• Your RDN will help you set a goal for how many carbohydrate servings to eat at your meals and snacks. For many adults, eating 3   to 5 servings of carbohydrate foods at each meal and 1 or 2 carbohydrate servings for each snack works well.  °• Check your blood glucose level regularly. It can tell you if you need to adjust when you eat carbohydrates. °• Choose foods rich in viscous (soluble) fiber °• Viscous, or soluble, is found in the walls of plant cells. Viscous fiber is found only in plant-based foods. Eating foods with fiber helps to lower your unhealthy cholesterol and keep your blood glucose in range   °• Rich sources of viscous fiber include vegetables (asparagus, Brussels sprouts, sweet potatoes, turnips) fruit (apricots, mangoes, oranges), legumes, and whole grains (barley, oats, and oat bran).  °• As you increase your fiber intake gradually, also increase the amount of water you drink. This will help prevent constipation.  °• If you have difficulty achieving this goal, ask your RDN about fiber laxatives. Choose fiber supplements made with viscous fibers such as psyllium seed husks or methylcellulose to help lower unhealthy cholesterol.  °• Limit refined carbohydrates  °• There are three types of carbohydrates: starches, sugar, and fiber. Some carbohydrates occur naturally in food, like the starches in rice or corn or the sugars in fruits and milk. Refined carbohydrates--foods with high amounts of simple sugars--can raise triglyceride levels. High triglyceride levels are associated with coronary heart disease. °• Some examples of refined carbohydrate foods are table sugar, sweets, and beverages sweetened with added sugar. °Tips for Reducing Sodium (Salt) °Although sodium is important for your body to function, too much sodium can be harmful for people with high blood pressure. As sodium and fluid buildup in your tissues and bloodstream, your blood pressure increases. High blood pressure may cause damage to other organs and increase your risk for a stroke. °Even if you take a pill for blood pressure or a water pill (diuretic) to remove fluid, it is still important to have less salt in your diet. Ask your doctor and RDN what amount of sodium is right for you. °• Avoid processed foods. Eat more fresh foods.  °• Fresh fruits and vegetables are naturally low in sodium, as well as frozen vegetables and fruits that have no added juices or sauces.  °• Fresh meats are lower in sodium than processed meats, such as bacon, sausage, and hotdogs. Read the nutrition label or ask your butcher to help you find a fresh meat  that is low in sodium. °• Eat less salt--at the table and when cooking.  °• A single teaspoon of table salt has 2,300 mg of sodium.  °• Leave the salt out of recipes for pasta, casseroles, and soups.  °• Ask your RDN how to cook your favorite recipes without sodium °• Be a smart shopper.  °• Look for food packages that say “salt-free” or “sodium-free.” These items contain less than 5 milligrams of sodium per serving.  °• “Very low-sodium” products contain less than 35 milligrams of sodium per serving.  °• “Low-sodium” products contain less than 140 milligrams of sodium per serving.  °• Beware for “Unsalted” or “No Added Salt” products. These items may still be high in sodium. Check the nutrition label. °• Add flavors to your food without adding sodium.  °• Try lemon juice, lime juice, fruit juice or vinegar.  °• Dry or fresh herbs add flavor. Try basil, bay leaf, dill, rosemary, parsley, sage, dry mustard, nutmeg, thyme, and paprika.  °• Pepper, red pepper flakes, and cayenne pepper can add spice t your meals without adding sodium. Hot sauce contains   sodium, but if you use just a drop or two, it will not add up to much.  °• Buy a sodium-free seasoning blend or make your own at home. °Additional Lifestyle Tips °Achieve and maintain a healthy weight. °• Talk with your RDN or your doctor about what is a healthy weight for you. °• Set goals to reach and maintain that weight.  °• To lose weight, reduce your calorie intake along with increasing your physical activity. A weight loss of 10 to 15 pounds could reduce LDL-cholesterol by 5 milligrams per deciliter. °Participate in physical activity. °• Talk with your health care team to find out what types of physical activity are best for you. Set a plan to get about 30 minutes of exercise on most days. ° °Foods Recommended °Food Group Foods Recommended  °Grains Whole grain breads and cereals, including whole wheat, barley, rye, buckwheat, corn, teff, quinoa, millet, amaranth,  brown or wild rice, sorghum, and oats °Pasta, especially whole wheat or other whole grain types  °Brown rice, quinoa or wild rice °Whole grain crackers, bread, rolls, pitas °Home-made bread with reduced-sodium baking soda  °Protein Foods Lean cuts of beef and pork (loin, leg, round, extra lean hamburger)  °Skinless poultry °Fish °Venison and other wild game °Dried beans and peas °Nuts and nut butters °Meat alternatives made with soy or textured vegetable protein  °Egg whites or egg substitute °Cold cuts made with lean meat or soy protein  °Dairy Nonfat (skim), low-fat, or 1%-fat milk  °Nonfat or low-fat yogurt or cottage cheese °Fat-free and low-fat cheese  °Vegetables Fresh, frozen, or canned vegetables without added fat or salt   °Fruits Fresh, frozen, canned, or dried fruit   °Oils Unsaturated oils (corn, olive, peanut, soy, sunflower, canola)  °Soft or liquid margarines and vegetable oil spreads  °Salad dressings °Seeds and nuts  °Avocado  ° °Foods Not Recommended °Food Group Foods Not Recommended  °Grains Breads or crackers topped with salt °Cereals (hot or cold) with more than 300 mg sodium per serving °Biscuits, cornbread, and other “quick” breads prepared with baking soda °Bread crumbs or stuffing mix from a store °High-fat bakery products, such as doughnuts, biscuits, croissants, danish pastries, pies, cookies °Instant cooking foods to which you add hot water and stir--potatoes, noodles, rice, etc. °Packaged starchy foods--seasoned noodle or rice dishes, stuffing mix, macaroni and cheese dinner °Snacks made with partially hydrogenated oils, including chips, cheese puffs, snack mixes, regular crackers, butter-flavored popcorn  °Protein Foods Higher-fat cuts of meats (ribs, t-bone steak, regular hamburger) °Bacon, sausage, or hot dogs °Cold cuts, such as salami or bologna, deli meats, cured meats, corned beef °Organ meats (liver, brains, gizzards, sweetbreads) °Poultry with skin °Fried or smoked meat,  poultry, and fish °Whole eggs and egg yolks (more than 2-4 per week) °Salted legumes, nuts, seeds, or nut/seed butters °Meat alternatives with high levels of sodium (>300 mg per serving) or saturated fat (>5 g per serving)  °Dairy Whole milk,?2% fat milk, buttermilk °Whole milk yogurt or ice cream °Cream °Half-&-half °Cream cheese °Sour cream °Cheese  °Vegetables Canned or frozen vegetables with salt, fresh vegetables prepared with salt, butter, cheese, or cream sauce °Fried vegetables °Pickled vegetables such as olives, pickles, or sauerkraut  °Fruits Fried fruits °Fruits served with butter or cream  °Oils Butter, stick margarine, shortening °Partially hydrogenated oils or trans fats °Tropical oils (coconut, palm, palm kernel oils)  °Other Candy, sugar sweetened soft drinks and desserts °Salt, sea salt, garlic salt, and seasoning mixes containing salt °Bouillon cubes °  Ketchup, barbecue sauce, Worcestershire sauce, soy sauce, teriyaki sauce °Miso °Salsa °Pickles, olives, relish  ° °Heart Healthy Consistent Carbohydrate Vegetarian (Lacto-Ovo) Sample 1-Day Menu  °Breakfast 1 cup oatmeal, cooked (2 carbohydrate servings)  °¾ cup blueberries (1 carbohydrate serving)  °11 almonds, without salt  °1 cup 1% milk (1 carbohydrate serving)  °1 cup coffee  °Morning Snack 1 cup fat-free plain yogurt (1 carbohydrate serving)  °Lunch 1 whole wheat bun (1½ carbohydrate servings)  °1 black bean burger (1 carbohydrate servings)  °1 slice cheddar cheese, low sodium  °2 slices tomatoes  °2 leaves lettuce  °1 teaspoon mustard  °1 small pear (1½ carbohydrate servings)  °1 cup green tea, unsweetened  °Afternoon Snack 1/3 cup trail mix with nuts, seeds, and raisins, without salt (1½ carbohydrate servinga)  °Evening Meal ½ cup meatless chicken  °2/3 cup brown rice, cooked (2 carbohydrate servings)  °1 cup broccoli, cooked (2/3 carbohydrate serving)  °½ cup carrots, cooked (1/3 carbohydrate serving)  °2 teaspoons olive oil  °1 teaspoon  balsamic vinegar  °1 whole wheat dinner roll (1 carbohydrate serving)  °1 teaspoon margarine, soft, tub  °1 cup 1% milk (1 carbohydrate serving)  °Evening Snack 1 extra small banana (1 carbohydrate serving)  °1 tablespoon peanut butter  ° °Heart Healthy Consistent Carbohydrate Vegan Sample 1-Day Menu  °Breakfast 1 cup oatmeal, cooked (2 carbohydrate servings)  °¾ cup blueberries (1 carbohydrate serving)  °11 almonds, without salt  °1 cup soymilk fortified with calcium, vitamin B12, and vitamin D  °1 cup coffee  °Morning Snack 6 ounces soy yogurt (1½ carbohydrate servings)  °Lunch 1 whole wheat bun(1½ carbohydrate servings)  °1 black bean burger (1 carbohydrate serving)  °2 slices tomatoes  °2 leaves lettuce  °1 teaspoon mustard  °1 small pear (1½ carbohydrate servings)  °1 cup green tea, unsweetened  °Afternoon Snack 1/3 cup trail mix with nuts, seeds, and raisins, without salt (1½ carbohydrate servings)  °Evening Meal ½ cup meatless chicken  °2/3 cup brown rice, cooked (2 carbohydrate servings)  °1 cup broccoli, cooked (2/3 carbohydrate serving)  °½ cup carrots, cooked (1/3 carbohydrate serving)  °2 teaspoons olive oil  °1 teaspoon balsamic vinegar  °1 whole wheat dinner roll (1 carbohydrate serving)  °1 teaspoon margarine, soft, tub  °1 cup soymilk fortified with calcium, vitamin B12, and vitamin D  °Evening Snack 1 extra small banana (1 carbohydrate serving)  °1 tablespoon peanut butter   ° °Heart Healthy Consistent Carbohydrate Sample 1-Day Menu  °Breakfast 1 cup cooked oatmeal (2 carbohydrate servings)  °3/4 cup blueberries (1 carbohydrate serving)  °1 ounce almonds  °1 cup skim milk (1 carbohydrate serving)  °1 cup coffee  °Morning Snack 1 cup sugar-free nonfat yogurt (1 carbohydrate serving)  °Lunch 2 slices whole-wheat bread (2 carbohydrate servings)  °2 ounces lean turkey breast  °1 ounce low-fat Swiss cheese  °1 teaspoon mustard  °1 slice tomato  °1 lettuce leaf  °1 small pear (1 carbohydrate serving)  °1  cup skim milk (1 carbohydrate serving)  °Afternoon Snack 1 ounce trail mix with unsalted nuts, seeds, and raisins (1 carbohydrate serving)  °Evening Meal 3 ounces salmon  °2/3 cup cooked brown rice (2 carbohydrate servings)  °1 teaspoon soft margarine  °1 cup cooked broccoli with 1/2 cup cooked carrots (1 carbohydrate serving  °Carrots, cooked, boiled, drained, without salt  °1 cup lettuce  °1 teaspoon olive oil with vinegar for dressing  °1 small whole grain roll (1 carbohydrate serving)  °1 teaspoon soft margarine  °  1 cup unsweetened tea  °Evening Snack 1 extra-small banana (1 carbohydrate serving)  °Copyright 2020 © [employer]. All rights reserved. °

## 2020-09-29 NOTE — Progress Notes (Signed)
Peripherally Inserted Central Catheter Placement  The IV Nurse has discussed with the patient and/or persons authorized to consent for the patient, the purpose of this procedure and the potential benefits and risks involved with this procedure.  The benefits include less needle sticks, lab draws from the catheter, and the patient may be discharged home with the catheter. Risks include, but not limited to, infection, bleeding, blood clot (thrombus formation), and puncture of an artery; nerve damage and irregular heartbeat and possibility to perform a PICC exchange if needed/ordered by physician.  Alternatives to this procedure were also discussed.  Bard Power PICC patient education guide, fact sheet on infection prevention and patient information card has been provided to patient /or left at bedside.    PICC Placement Documentation  PICC Double Lumen 09/29/20 PICC Right Basilic 42 cm 0 cm (Active)  Indication for Insertion or Continuance of Line Vasoactive infusions 09/29/20 0821  Exposed Catheter (cm) 0 cm 09/29/20 0092  Site Assessment Clean;Dry;Intact 09/29/20 0821  Lumen #1 Status Flushed;Saline locked;Blood return noted 09/29/20 0821  Lumen #2 Status Flushed;Saline locked;Blood return noted 09/29/20 0821  Dressing Type Transparent;Securing device 09/29/20 3300  Dressing Status Clean;Dry;Intact 09/29/20 0821  Antimicrobial disc in place? Yes 09/29/20 0821  Dressing Intervention New dressing 09/29/20 0821  Dressing Change Due 10/06/20 09/29/20 0821       Annett Fabian 09/29/2020, 8:23 AM

## 2020-09-29 NOTE — Progress Notes (Signed)
   09/29/20 1100  Mobility  Activity Refused mobility (Pt declined fro unspecified reasons. Attempted to see pt x2. Will check back as time permits)

## 2020-09-29 NOTE — Progress Notes (Signed)
PROGRESS NOTE    Chad Wilkerson  ZTI:[id number] DOB: July 17, 1945 DOA: 09/28/2020 PCP: [name], NP    Chief Complaint  Patient presents with  . Shortness of Breath   Brief Narrative:  Chad Wilkerson is Chad Wilkerson 75 y.o. male with medical history significant for chronic combined systolic and diastolic CHF (EF less than 20%, G3 DD by TTE 09/28/2020), NICM, s/p ICD, atrial fibrillation on Eliquis and Tikosyn, T2DM, HTN, and OSA on BiPAP who presented to the ED for evaluation of shortness of breath and weightgain.  Patient reports 1 week of progressive dyspnea, DOE, orthopnea weight gain.  He was seen in follow-up in his cardiology office earlier today.  He underwent echocardiogram which showed EF <20%.  He had significant shortness of breath therefore was sent to the ED for further management.  He's been admitted with HF exacerbation.  Assessment & Plan:   Principal Problem:   Acute on chronic combined systolic (congestive) and diastolic (congestive) heart failure (HCC) Active Problems:   Sleep apnea   Paroxysmal atrial fibrillation (HCC)   ICD (implantable cardioverter-defibrillator) in place  Chad Wilkerson is Chad Wilkerson 75 y.o. male with medical history significant for chronic combined systolic and diastolic CHF (EF less than 20%, G3 DD by TTE 09/28/2020), NICM, s/p ICD, atrial fibrillation on Eliquis and Tikosyn, T2DM, HTN, and OSA on BiPAP who is admitted with acute on chronic combined systolic and diastolic CHF.  Acute on chronic combined systolic and diastolic CHF s/p ICD: TTE 4/19 prior to admit showed EF <20% with G3 DD. Also moderately reduced RVSF.   Cardiology following and managing diuresis.  At risk for low output heart failure and therefore started on milrinone. -Cardiology following - appreciate assistance -> milrinone, lasix gtt, farxiga, spironolactone 12.5 mg daily - possibly repeat RHC after diuresed, possible LVAD candidate? -Monitor strict I/O's and daily  weights  Hypotension: Holding home Coreg.  On milrinone as above.  IV lasix.  Spironolactone.   Paroxysmal atrial fibrillation: Continue Eliquis and Tikosyn.  Holding Coreg.  Type 2 diabetes: Hold home metformin.  Continue farxiga.  SSI.   OSA: Continue BiPAP nightly.  DVT prophylaxis: eliquis Code Status: DNR Family Communication: none at bedside Disposition:   Status is: Inpatient  Remains inpatient appropriate because:Inpatient level of care appropriate due to severity of illness   Dispo: The patient is from: home              Anticipated d/c is to: pending              Patient currently is not medically stable to d/c.   Difficult to place patient No       Consultants:   cardiology  Procedures: Echo IMPRESSIONS    1. Left ventricular ejection fraction, by estimation, is <20%. The left  ventricle has severely decreased function. The left ventricle demonstrates  global hypokinesis. The left ventricular internal cavity size was severely  dilated. Left ventricular  diastolic parameters are consistent with Grade III diastolic dysfunction  (restrictive).  2. Right ventricular systolic function is moderately reduced. The right  ventricular size is mildly enlarged. There is normal pulmonary artery  systolic pressure.  3. Left atrial size was severely dilated.  4. Right atrial size was severely dilated.  5. The mitral valve is grossly normal. Mild to moderate mitral valve  regurgitation.  6. Tricuspid valve regurgitation is moderate to severe.  7. The aortic valve is grossly normal. Aortic valve regurgitation is not  visualized.  8. Aortic  dilatation noted. There is mild dilatation of the ascending  aorta, measuring 41 mm.  Antimicrobials: Anti-infectives (From admission, onward)   None         Subjective: No new complaints today  Objective: Vitals:   09/29/20 0746 09/29/20 0844 09/29/20 0920 09/29/20 1142  BP:   102/76   Pulse:   67    Resp:  (!) 21 20   Temp:  98.4 F (36.9 C)  98.4 F (36.9 C)  TempSrc:  Oral  Oral  SpO2: 96%  90%   Weight:      Height:        Intake/Output Summary (Last 24 hours) at 09/29/2020 1545 Last data filed at 09/29/2020 1402 Gross per 24 hour  Intake 1261.98 ml  Output 2300 ml  Net -1038.02 ml   Filed Weights   09/28/20 1554 09/29/20 0005  Weight: 108.8 kg 108.4 kg    Examination:  General exam: Appears calm and comfortable  Respiratory system: Clear to auscultation. Respiratory effort normal. Cardiovascular system: irregularly irregular Gastrointestinal system: Abdomen is nondistended, soft and nontender. Central nervous system: Alert and oriented. No focal neurological deficits. Extremities: trace LE edema, notable dependent edema to thigh  Skin: No rashes, lesions or ulcers Psychiatry: Judgement and insight appear normal. Mood & affect appropriate.     Data Reviewed: I have personally reviewed following labs and imaging studies  CBC: Recent Labs  Lab 09/28/20 1612 09/29/20 0040 09/29/20 1419  WBC 8.9 9.7 8.2  NEUTROABS 6.8  --   --   HGB 15.0 14.2 13.2  HCT 45.8 43.3 40.1  MCV 89.3 88.4 88.7  PLT 257 234 200    Basic Metabolic Panel: Recent Labs  Lab 09/28/20 1612 09/29/20 0040 09/29/20 1419  NA 133* 135  --   K 3.7 3.3*  --   CL 98 97*  --   CO2 26 26  --   GLUCOSE 131* 191*  --   BUN 20 19  --   CREATININE 1.03 1.09  --   CALCIUM 8.9 8.7*  --   MG  --  2.1 2.0    GFR: Estimated Creatinine Clearance: 71.6 mL/min (by C-G formula based on SCr of 1.09 mg/dL).  Liver Function Tests: Recent Labs  Lab 09/28/20 1612  AST 33  ALT 25  ALKPHOS 77  BILITOT 1.5*  PROT 6.9  ALBUMIN 3.6    CBG: Recent Labs  Lab 09/29/20 0629 09/29/20 1113  GLUCAP 136* 154*     Recent Results (from the past 240 hour(s))  Resp Panel by RT-PCR (Flu Feleshia Zundel&B, Covid) Nasopharyngeal Swab     Status: None   Collection Time: 09/28/20  4:13 PM   Specimen:  Nasopharyngeal Swab; Nasopharyngeal(NP) swabs in vial transport medium  Result Value Ref Range Status   SARS Coronavirus 2 by RT PCR NEGATIVE NEGATIVE Final    Comment: (NOTE) SARS-CoV-2 target nucleic acids are NOT DETECTED.  The SARS-CoV-2 RNA is generally detectable in upper respiratory specimens during the acute phase of infection. The lowest concentration of SARS-CoV-2 viral copies this assay can detect is 138 copies/mL. Adonte Vanriper negative result does not preclude SARS-Cov-2 infection and should not be used as the sole basis for treatment or other patient management decisions. Merial Moritz negative result may occur with  improper specimen collection/handling, submission of specimen other than nasopharyngeal swab, presence of viral mutation(s) within the areas targeted by this assay, and inadequate number of viral copies(<138 copies/mL). Ellagrace Yoshida negative result must be combined with clinical observations, patient  history, and epidemiological information. The expected result is Negative.  Fact Sheet for Patients:  BloggerCourse.com  Fact Sheet for Healthcare Providers:  SeriousBroker.it  This test is no t yet approved or cleared by the [place] FDA and  has been authorized for detection and/or diagnosis of SARS-CoV-2 by FDA under an Emergency Use Authorization (EUA). This EUA will remain  in effect (meaning this test can be used) for the duration of the COVID-19 declaration under Section 564(b)(1) of the Act, 21 U.S.C.section 360bbb-3(b)(1), unless the authorization is terminated  or revoked sooner.       Influenza Egidio Lofgren by PCR NEGATIVE NEGATIVE Final   Influenza B by PCR NEGATIVE NEGATIVE Final    Comment: (NOTE) The Xpert Xpress SARS-CoV-2/FLU/RSV plus assay is intended as an aid in the diagnosis of influenza from Nasopharyngeal swab specimens and should not be used as Ketih Goodie sole basis for treatment. Nasal washings and aspirates are unacceptable for  Xpert Xpress SARS-CoV-2/FLU/RSV testing.  Fact Sheet for Patients: BloggerCourse.com  Fact Sheet for Healthcare Providers: SeriousBroker.it  This test is not yet approved or cleared by the [place] FDA and has been authorized for detection and/or diagnosis of SARS-CoV-2 by FDA under an Emergency Use Authorization (EUA). This EUA will remain in effect (meaning this test can be used) for the duration of the COVID-19 declaration under Section 564(b)(1) of the Act, 21 U.S.C. section 360bbb-3(b)(1), unless the authorization is terminated or revoked.  Performed at [facility], [address], [place], [place] [address]          Radiology Studies: DG Chest Port 1 View  Result Date: 09/28/2020 CLINICAL DATA:  Worsening shortness of breath. EXAM: PORTABLE CHEST 1 VIEW COMPARISON:  None. FINDINGS: Left chest wall pacemaker with single lead terminating in the right ventricle. Moderate cardiomegaly. Pulmonary vascular congestion with mild diffuse interstitial thickening. No focal consolidation, pleural effusion, or pneumothorax. No acute osseous abnormality. IMPRESSION: 1. Cardiomegaly with mild interstitial pulmonary edema. Electronically Signed   By: Obie Dredge M.D.   On: 09/28/2020 16:51   ECHOCARDIOGRAM COMPLETE  Result Date: 09/28/2020    ECHOCARDIOGRAM REPORT   Patient Name:   Chad Wilkerson Date of Exam: 09/28/2020 Medical Rec #:  [id number]         Height:       68.0 in Accession #:    [id number]        Weight:       231.0 lb Date of Birth:  08/24/1945          BSA:          2.173 m Patient Age:    74 years          BP:           98/67 mmHg Patient Gender: M                 HR:           72 bpm. Exam Location:  [address] Procedure: 2D Echo, Cardiac Doppler and Color Doppler Indications:    I50.22 CHF  History:        Patient has prior history of Echocardiogram examinations, most                 recent 04/23/2020. Risk  Factors:Sleep Apnea, Diabetes and                 Hypertension.  Sonographer:    Clearence Ped RCS Referring Phys: [id number] [facility] Declyn Delsol Izora Ribas  IMPRESSIONS  1. Left ventricular ejection fraction, by estimation, is <20%. The left ventricle has severely decreased function. The left ventricle demonstrates global hypokinesis. The left ventricular internal cavity size was severely dilated. Left ventricular diastolic parameters are consistent with Grade III diastolic dysfunction (restrictive).  2. Right ventricular systolic function is moderately reduced. The right ventricular size is mildly enlarged. There is normal pulmonary artery systolic pressure.  3. Left atrial size was severely dilated.  4. Right atrial size was severely dilated.  5. The mitral valve is grossly normal. Mild to moderate mitral valve regurgitation.  6. Tricuspid valve regurgitation is moderate to severe.  7. The aortic valve is grossly normal. Aortic valve regurgitation is not visualized.  8. Aortic dilatation noted. There is mild dilatation of the ascending aorta, measuring 41 mm. FINDINGS  Left Ventricle: Left ventricular ejection fraction, by estimation, is <20%. The left ventricle has severely decreased function. The left ventricle demonstrates global hypokinesis. The left ventricular internal cavity size was severely dilated. There is no left ventricular hypertrophy. Left ventricular diastolic parameters are consistent with Grade III diastolic dysfunction (restrictive). Right Ventricle: The right ventricular size is mildly enlarged. Right vetricular wall thickness was not well visualized. Right ventricular systolic function is moderately reduced. There is normal pulmonary artery systolic pressure. The tricuspid regurgitant velocity is 1.82 m/s, and with an assumed right atrial pressure of 3 mmHg, the estimated right ventricular systolic pressure is 16.2 mmHg. Left Atrium: Left atrial size was severely dilated. Right Atrium: Right atrial size  was severely dilated. Pericardium: There is no evidence of pericardial effusion. Mitral Valve: The mitral valve is grossly normal. There is mild thickening of the mitral valve leaflet(s). There is mild calcification of the mitral valve leaflet(s). Mild mitral annular calcification. Mild to moderate mitral valve regurgitation. Tricuspid Valve: The tricuspid valve is grossly normal. Tricuspid valve regurgitation is moderate to severe. Aortic Valve: The aortic valve is grossly normal. Aortic valve regurgitation is not visualized. Aortic regurgitation PHT measures 530 msec. Aortic valve mean gradient measures 9.0 mmHg. Aortic valve peak gradient measures 17.5 mmHg. Aortic valve area, by  VTI measures 0.84 cm. Pulmonic Valve: The pulmonic valve was normal in structure. Pulmonic valve regurgitation is trivial. Aorta: Aortic dilatation noted. There is mild dilatation of the ascending aorta, measuring 41 mm. IAS/Shunts: The atrial septum is grossly normal. Additional Comments: Anicia Leuthold device lead is visualized.  LEFT VENTRICLE PLAX 2D LVIDd:         6.90 cm  Diastology LVIDs:         6.20 cm  LV e' medial:    4.90 cm/s LV PW:         1.00 cm  LV E/e' medial:  20.3 LV IVS:        0.80 cm  LV e' lateral:   8.27 cm/s LVOT diam:     2.20 cm  LV E/e' lateral: 12.0 LV SV:         35 LV SV Index:   16 LVOT Area:     3.80 cm  RIGHT VENTRICLE RV Basal diam:  5.50 cm RV S prime:     5.00 cm/s TAPSE (M-mode): 1.9 cm RVSP:           16.2 mmHg LEFT ATRIUM              Index       RIGHT ATRIUM           Index LA diam:  5.70 cm  2.62 cm/m  RA Pressure: 3.00 mmHg LA Vol (A2C):   152.0 ml 69.96 ml/m RA Area:     28.30 cm LA Vol (A4C):   70.6 ml  32.49 ml/m RA Volume:   96.70 ml  44.50 ml/m LA Biplane Vol: 105.0 ml 48.32 ml/m  AORTIC VALVE AV Area (Vmax):    0.84 cm AV Area (Vmean):   0.85 cm AV Area (VTI):     0.84 cm AV Vmax:           209.00 cm/s AV Vmean:          142.000 cm/s AV VTI:            0.417 m AV Peak Grad:      17.5  mmHg AV Mean Grad:      9.0 mmHg LVOT Vmax:         46.30 cm/s LVOT Vmean:        31.700 cm/s LVOT VTI:          0.092 m LVOT/AV VTI ratio: 0.22 AI PHT:            530 msec  AORTA Ao Root diam: 3.20 cm Ao Asc diam:  4.10 cm MITRAL VALVE                 TRICUSPID VALVE MV Area (PHT):               TR Peak grad:   13.2 mmHg MV Decel Time:               TR Vmax:        182.00 cm/s MR Peak grad:    55.7 mmHg   Estimated RAP:  3.00 mmHg MR Mean grad:    35.0 mmHg   RVSP:           16.2 mmHg MR Vmax:         373.00 cm/s MR Vmean:        273.0 cm/s  SHUNTS MR PISA:         4.02 cm    Systemic VTI:  0.09 m MR PISA Eff ROA: 38 mm      Systemic Diam: 2.20 cm MR PISA Radius:  0.80 cm MV E velocity: 99.40 cm/s MV Avenir Lozinski velocity: 43.30 cm/s MV E/Terrica Duecker ratio:  2.30 Kristeen Miss MD Electronically signed by Kristeen Miss MD Signature Date/Time: 09/28/2020/3:45:27 PM    Final    [place] EKG SITE RITE  Result Date: 09/29/2020 If Site Rite image not attached, placement could not be confirmed due to current cardiac rhythm.       Scheduled Meds: . apixaban  5 mg Oral BID  . atorvastatin  20 mg Oral Daily  . Chlorhexidine Gluconate Cloth  6 each Topical Daily  . dapagliflozin propanediol  10 mg Oral Daily  . docusate sodium  100 mg Oral BID  . dofetilide  125 mcg Oral Q12H  . escitalopram  10 mg Oral Daily  . insulin aspart  0-9 Units Subcutaneous TID WC  . mometasone-formoterol  2 puff Inhalation BID  . potassium chloride  40 mEq Oral Once  . potassium chloride  40 mEq Oral Once  . sodium chloride flush  10-40 mL Intracatheter Q12H  . sodium chloride flush  3 mL Intravenous Q12H  . spironolactone  12.5 mg Oral Daily   Continuous Infusions: . furosemide (LASIX) 200 mg in dextrose 5% 100 mL (2mg /mL) infusion 12 mg/hr (09/29/20 1402)  . milrinone  0.375 mcg/kg/min (09/29/20 1303)     LOS: 1 day    Time spent: over 30 min    Lacretia Nicks, MD [facility]   To contact the attending provider between  7A-7P or the covering provider during after hours 7P-7A, please log into the web site [email or web address] and access using universal [place] password for that web site. If you do not have the password, please call the hospital operator.  09/29/2020, 3:45 PM

## 2020-09-29 NOTE — Plan of Care (Signed)
  Problem: Education: Goal: Knowledge of General Education information will improve Description: Including pain rating scale, medication(s)/side effects and non-pharmacologic comfort measures Outcome: Progressing   Problem: Clinical Measurements: Goal: Ability to maintain clinical measurements within normal limits will improve Outcome: Progressing   Problem: Elimination: Goal: Will not experience complications related to bowel motility Outcome: Progressing Goal: Will not experience complications related to urinary retention Outcome: Progressing

## 2020-09-29 NOTE — Consult Note (Addendum)
Advanced Heart Failure Team Consult Note   Primary Physician: [name], NP PCP-Cardiologist:  Christell Constant, MD  Reason for Consultation: acute on chronic combined systolic and diastolic heart failure w/ low output  HPI:    Chad Wilkerson is seen today for evaluation of acute on chronic combined systolic and diastolic heart failure w/ low output at the request of Dr. Avelino Leeds, Cardiology.   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   Of note, he was recently admitted 2/22 for a/c CHF. 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 RHC showed elevated filling pressures and marginal output. CO 4.43 L/min, CI 2.04. Also noted to have moderate AS (Echo with AVA over 1.0 cm2, dimensionless index 0.36). He was diuresed w/ IV Lasix but difficulties tolerating ARB/ARNi due to low BP.  This admit, Wt up ~10 lb above baseline. Optivol also c/w fluid overload. Now HYHA Class IIIb.  Echo repeated. LVEF slightly lower, now <20% w/ GIII DDD. RV moderately reduced. Initial poor response to high dose IV lasix w/ concern for low output. Was started on empiric low dose milrinone 0.25 mgc/kg/min last night. PICC line place. Co-ox this morning 58% on milrinone. SCr 1.09. K 3.3. He is diuresing but UOP remains sluggish.   Complains of constipation.     R/LHC 07/19/20   Prox RCA to Mid RCA lesion is 20% stenosed.  1st Mrg lesion is 40% stenosed.  Mid LAD lesion is 20% stenosed.   1. Mild non-obstructive CAD 2. Elevated filling pressures: RA:13, RV 64/8/14 PA 64/26 (mean 40), PCWP: 28, LV: 101/15/25  AO:  90/60 CO: 4.43 L/min  CI: 2.04  3. Moderate aortic stenosis. He has low gradients across the valve by echo and by cath. Cath: mean gradient 10.5 mmHg, peak to peak gradient 12 mmhg, AVA 1.83 cm2).  The valve was easily crossed with the J wire. Cannot exclude low flow/low gradient AS but the dimensionless index and AVA findings on echo would also argue against this being severe.   Recommendations: He is volume overloaded and dyspneic even with speaking. Will admit for diuresis with IV Lasix. He has severe LV systolic dysfunction with low gradients across the aortic valve. The valve was easily crossed with a J wire. I suspect that he has moderate aortic stenosis. (Echo with AVA over 1.0 cm2, dimensionless index 0.36). Will review his findings with our structural heart team.   2D Echo 09/28/20 1. Left ventricular ejection fraction, by estimation, is <20%. The left ventricle has severely decreased function. The left ventricle demonstrates global hypokinesis. The left ventricular internal cavity size was severely dilated. Left ventricular diastolic parameters are consistent with Grade III diastolic dysfunction (restrictive). 2. Right ventricular systolic function is moderately reduced. The right ventricular size is mildly enlarged. There is normal pulmonary artery systolic pressure. 3. Left atrial size was severely dilated. 4. Right atrial size was severely dilated. 5. The mitral valve is grossly normal. Mild to moderate mitral valve regurgitation. 6. Tricuspid valve regurgitation is moderate to severe. 7. The aortic valve is grossly normal. Aortic valve regurgitation is not visualized. 8. Aortic dilatation noted. There is mild dilatation of the  ascending aorta, measuring 41 mm.  Aortic Valve: The aortic valve is grossly normal. Aortic valve  regurgitation is not visualized. Aortic regurgitation PHT measures 530  msec. Aortic valve mean gradient measures 9.0 mmHg. Aortic valve peak  gradient  measures 17.5 mmHg. Aortic valve area, by  VTI measures 0.84 cm.    Review of Systems: [y] = yes, [ ]  = no   . General: Weight gain [ ] ; Weight loss [ ] ; Anorexia [ ] ; Fatigue [ ] ; Fever [ ] ; Chills [ ] ; Weakness [ ]   . Cardiac: Chest pain/pressure [ ] ; Resting SOB [ ] ; Exertional SOB [ Y]; Orthopnea [ ] ; Pedal Edema [ ] ; Palpitations [ ] ; Syncope [ ] ; Presyncope [ ] ; Paroxysmal nocturnal dyspnea[ ]   . Pulmonary: Cough [ ] ; Wheezing[ ] ; Hemoptysis[ ] ; Sputum [ ] ; Snoring [ ]   . GI: Vomiting[ ] ; Dysphagia[ ] ; Melena[ ] ; Hematochezia [ ] ; Heartburn[ ] ; Abdominal pain [ ] ; Constipation [Y ]; Diarrhea [ ] ; BRBPR [ ]   . GU: Hematuria[ ] ; Dysuria [ ] ; Nocturia[ ]   . Vascular: Pain in legs with walking [ ] ; Pain in feet with lying flat [ ] ; Non-healing sores [ ] ; Stroke [ ] ; TIA [ ] ; Slurred speech [ ] ;  . Neuro: Headaches[ ] ; Vertigo[ ] ; Seizures[ ] ; Paresthesias[ ] ;Blurred vision [ ] ; Diplopia [ ] ; Vision changes [ ]   . Ortho/Skin: Arthritis [ ] ; Joint pain [ ] ; Muscle pain [ ] ; Joint swelling [ ] ; Back Pain [ ] ; Rash [ ]   . Psych: Depression[ ] ; Anxiety[ ]   . Heme: Bleeding problems [ ] ; Clotting disorders [ ] ; Anemia [ ]   . Endocrine: Diabetes [ ] ; Thyroid dysfunction[ ]   Home Medications Prior to Admission medications   Medication Sig Start Date End Date Taking? Authorizing Provider  Arginine (L-ARGININE-500) 500 MG CAPS Take 500 mg by mouth daily.   Yes [provider]  Ascorbic Acid (VITAMIN C) 1000 MG tablet Take 1,000 mg by mouth 2 (two) times daily.   Yes [provider]  atorvastatin (LIPITOR) 20 MG tablet Take 20 mg by mouth daily. 01/25/20  Yes [provider]  B Complex Vitamins (B COMPLEX PO) Take 1 tablet by mouth daily.   Yes [provider]  Biotin 10 MG TABS Take 10 mg by mouth daily. 10 mg - 10,000 mcg   Yes [provider]  budesonide-formoterol (SYMBICORT) 160-4.5 MCG/ACT inhaler Inhale 2 puffs into the lungs 2 (two) times daily.  08/02/20  Yes , MD  CALCIUM PO Take 1 tablet by mouth 2 (two) times daily.   Yes [provider]  carvedilol (COREG) 6.25 MG tablet Take 6.25 mg by mouth 2 (two) times daily. 01/11/20  Yes [provider]  cholecalciferol (VITAMIN D3) 25 MCG (1000 UNIT) tablet Take 1,000 Units by mouth 2 (two) times daily.   Yes [provider]  CHROMIUM PO Take 1 tablet by mouth 2 (two) times daily.   Yes [provider]  dapagliflozin propanediol (FARXIGA) 10 MG TABS tablet Take 1 tablet (10 mg total) by mouth daily before breakfast. 07/13/20  Yes , , MD  dofetilide (TIKOSYN) 125 MCG capsule Take 125 mcg by mouth every 12 (twelve) hours. 03/26/20  Yes [provider]  ELIQUIS 5 MG TABS tablet Take 5 mg by mouth 2 (two) times daily. 10/03/19  Yes [provider]  escitalopram (LEXAPRO) 10 MG tablet TAKE 1 TABLET(10 MG) BY MOUTH DAILY Patient taking differently: Take 10 mg  by mouth daily. 08/23/20  Yes Nafziger, Kandee Keen, NP  levOCARNitine (L-CARNITINE) 500 MG TABS Take 500 mg by mouth daily.   Yes [provider]  Magnesium 400 MG TABS Take 400 mg by mouth daily. Patient taking differently: Take 400 mg by mouth 2 (two) times daily. 05/11/20  Yes Georgie Chard D, NP  metFORMIN (GLUCOPHAGE) 500 MG tablet Take 1-3 tablets (500-1,500 mg total) by mouth See admin instructions. Take one tablet (500 mg) by mouth daily with breakfast and three tablets (1500 mg) daily with supper 07/22/20  Yes Leone Brand, NP  Potassium Chloride ER 20 MEQ TBCR Take 1 tablet by mouth 2 (two) times daily. Patient taking differently: Take 20 mEq by mouth 2 (two) times daily. 09/07/20  Yes Chandrasekhar, Mahesh A, MD  spironolactone (ALDACTONE) 25 MG tablet Take 25 mg by mouth daily. 02/03/20  Yes [provider]  torsemide (DEMADEX) 100 MG tablet Take 1 tablet (100 mg total) by mouth 2 (two) times daily. 07/28/20  Yes Chandrasekhar, Mahesh  A, MD  vitamin E 180 MG (400 UNITS) capsule Take 400 Units by mouth daily.   Yes [provider]  Zinc 50 MG TABS Take 50 mg by mouth daily.   Yes [provider]  Zn-Pyg Afri-Nettle-Saw Palmet (SAW PALMETTO COMPLEX PO) Take 540 mg by mouth daily.   Yes [provider]  acetaminophen (TYLENOL) 325 MG tablet Take 2 tablets (650 mg total) by mouth every 4 (four) hours as needed for headache or mild pain. Patient not taking: No sig reported 07/20/20   Leone Brand, NP  [facility] ULTRA test strip USE TO TEST BLOOD SUGAR 2 TIMES DAILY 06/16/20   [provider]    Past Medical History: Past Medical History:  Diagnosis Date  . Adjustment disorder   . Atrial fibrillation (HCC)   . Benign colon polyp   . CHF (congestive heart failure) (HCC)   . Diabetes mellitus without complication (HCC)   . Diverticulitis   . Essential hypertension   . Heart disease   . Hypogonadism male   . NICM (nonischemic cardiomyopathy) (HCC) 07/20/2020  . Sleep apnea   . Urine incontinence   . Vitamin D deficiency     Past Surgical History: Past Surgical History:  Procedure Laterality Date  . CHOLECYSTECTOMY    . RIGHT/LEFT HEART CATH AND CORONARY ANGIOGRAPHY N/A 07/19/2020   Procedure: RIGHT/LEFT HEART CATH AND CORONARY ANGIOGRAPHY;  Surgeon: Kathleene Hazel, MD;  Location: [facility] INVASIVE CV LAB;  Service: Cardiovascular;  Laterality: N/A;  . TONSILLECTOMY      Family History: Family History  Problem Relation Age of Onset  . Heart attack Mother   . High Cholesterol Mother   . Asthma Father   . Kidney disease Sister   . Stroke Maternal Grandfather   . Heart attack Paternal Grandfather     Social History: Social History   Socioeconomic History  . Marital status: Widowed    Spouse name: Not on file  . Number of children: Not on file  . Years of education: Not on file  . Highest education level: Not on file  Occupational History  . Not on file  Tobacco Use  .  Smoking status: Former Games developer  . Smokeless tobacco: Never Used  Vaping Use  . Vaping Use: Never used  Substance and Sexual Activity  . Alcohol use: Not Currently  . Drug use: Not Currently  . Sexual activity: Not on file  Other Topics Concern  . Not  on file  Social History Narrative  . Not on file   Social Determinants of Health   Financial Resource Strain: Medium Risk  . Difficulty of Paying Living Expenses: Somewhat hard  Food Insecurity: No Food Insecurity  . Worried About Programme researcher, broadcasting/film/video in the Last Year: Never true  . Ran Out of Food in the Last Year: Never true  Transportation Needs: No Transportation Needs  . Lack of Transportation (Medical): No  . Lack of Transportation (Non-Medical): No  Physical Activity: Inactive  . Days of Exercise per Week: 0 days  . Minutes of Exercise per Session: 0 min  Stress: No Stress Concern Present  . Feeling of Stress : Not at all  Social Connections: Moderately Isolated  . Frequency of Communication with Friends and Family: Once a week  . Frequency of Social Gatherings with Friends and Family: More than three times a week  . Attends Religious Services: Never  . Active Member of Clubs or Organizations: No  . Attends Banker Meetings: Never  . Marital Status: Living with partner    Allergies:  Allergies  Allergen Reactions  . Semaglutide Other (See Comments)    Nausea ,vomiting ,back pain    Objective:    Vital Signs:   Temp:  [97.5 F (36.4 C)-98.7 F (37.1 C)] 98.4 F (36.9 C) (04/20 0844) Pulse Rate:  [35-89] 67 (04/20 0920) Resp:  [14-29] 20 (04/20 0920) BP: (81-120)/(60-88) 102/76 (04/20 0920) SpO2:  [90 %-99 %] 90 % (04/20 0920) Weight:  [108.4 kg-108.8 kg] 108.4 kg (04/20 0005)    Weight change: Filed Weights   09/28/20 1554 09/29/20 0005  Weight: 108.8 kg 108.4 kg    Intake/Output:   Intake/Output Summary (Last 24 hours) at 09/29/2020 1015 Last data filed at 09/29/2020 0849 Gross per 24  hour  Intake 784.98 ml  Output 1450 ml  Net -665.02 ml      Physical Exam    CVP 16  General:  Fatigued appearing elderly WM wearing CPAP. HEENT: hard of hearing, otherwise normal Neck: supple. JVP elevated to ear . Carotids 2+ bilat; no bruits. No lymphadenopathy or thyromegaly appreciated. Cor: PMI nondisplaced. Regular rate & rhythm. 2/6 AS murmur Lungs: decreased BS at the bases Abdomen: obese, soft, nontender,mildly distended. No hepatosplenomegaly. No bruits or masses. Good bowel sounds. Extremities: no cyanosis, clubbing, rash, edema + RUE PICC  Neuro: alert & orientedx3, cranial nerves grossly intact. moves all 4 extremities w/o difficulty. Affect pleasant   Telemetry   NSR 70s w/ 1st degree AVB   EKG   NSR w/ 1st degree AVB w/ PVCs QT/QTcB 424/504 ms  Labs   Basic Metabolic Panel: Recent Labs  Lab 09/28/20 1612 09/29/20 0040  NA 133* 135  K 3.7 3.3*  CL 98 97*  CO2 26 26  GLUCOSE 131* 191*  BUN 20 19  CREATININE 1.03 1.09  CALCIUM 8.9 8.7*  MG  --  2.1    Liver Function Tests: Recent Labs  Lab 09/28/20 1612  AST 33  ALT 25  ALKPHOS 77  BILITOT 1.5*  PROT 6.9  ALBUMIN 3.6   Recent Labs  Lab 09/28/20 1612  LIPASE 59*   No results for input(s): AMMONIA in the last 168 hours.  CBC: Recent Labs  Lab 09/28/20 1612 09/29/20 0040  WBC 8.9 9.7  NEUTROABS 6.8  --   HGB 15.0 14.2  HCT 45.8 43.3  MCV 89.3 88.4  PLT 257 234    Cardiac Enzymes: No results  for input(s): CKTOTAL, CKMB, CKMBINDEX, TROPONINI in the last 168 hours.  BNP: BNP (last 3 results) Recent Labs    09/28/20 1613  BNP 3,328.4*    ProBNP (last 3 results) Recent Labs    04/01/20 1509  PROBNP 5,001*     CBG: Recent Labs  Lab 09/29/20 0629  GLUCAP 136*    Coagulation Studies: No results for input(s): LABPROT, INR in the last 72 hours.   Imaging   DG Chest Port 1 View  Result Date: 09/28/2020 CLINICAL DATA:  Worsening shortness of breath. EXAM:  PORTABLE CHEST 1 VIEW COMPARISON:  None. FINDINGS: Left chest wall pacemaker with single lead terminating in the right ventricle. Moderate cardiomegaly. Pulmonary vascular congestion with mild diffuse interstitial thickening. No focal consolidation, pleural effusion, or pneumothorax. No acute osseous abnormality. IMPRESSION: 1. Cardiomegaly with mild interstitial pulmonary edema. Electronically Signed   By: Obie Dredge M.D.   On: 09/28/2020 16:51   ECHOCARDIOGRAM COMPLETE  Result Date: 09/28/2020    ECHOCARDIOGRAM REPORT   Patient Name:   Chad Wilkerson Date of Exam: 09/28/2020 Medical Rec #:  [id number]         Height:       68.0 in Accession #:    [id number]        Weight:       231.0 lb Date of Birth:  1946/02/16          BSA:          2.173 m Patient Age:    74 years          BP:           98/67 mmHg Patient Gender: M                 HR:           72 bpm. Exam Location:  [address] Procedure: 2D Echo, Cardiac Doppler and Color Doppler Indications:    I50.22 CHF  History:        Patient has prior history of Echocardiogram examinations, most                 recent 04/23/2020. Risk Factors:Sleep Apnea, Diabetes and                 Hypertension.  Sonographer:    Clearence Ped RCS Referring Phys: [id number] MAHESH A CHANDRASEKHAR IMPRESSIONS  1. Left ventricular ejection fraction, by estimation, is <20%. The left ventricle has severely decreased function. The left ventricle demonstrates global hypokinesis. The left ventricular internal cavity size was severely dilated. Left ventricular diastolic parameters are consistent with Grade III diastolic dysfunction (restrictive).  2. Right ventricular systolic function is moderately reduced. The right ventricular size is mildly enlarged. There is normal pulmonary artery systolic pressure.  3. Left atrial size was severely dilated.  4. Right atrial size was severely dilated.  5. The mitral valve is grossly normal. Mild to moderate mitral valve regurgitation.  6.  Tricuspid valve regurgitation is moderate to severe.  7. The aortic valve is grossly normal. Aortic valve regurgitation is not visualized.  8. Aortic dilatation noted. There is mild dilatation of the ascending aorta, measuring 41 mm. FINDINGS  Left Ventricle: Left ventricular ejection fraction, by estimation, is <20%. The left ventricle has severely decreased function. The left ventricle demonstrates global hypokinesis. The left ventricular internal cavity size was severely dilated. There is no left ventricular hypertrophy. Left ventricular diastolic parameters are consistent with Grade III diastolic dysfunction (restrictive). Right Ventricle: The  right ventricular size is mildly enlarged. Right vetricular wall thickness was not well visualized. Right ventricular systolic function is moderately reduced. There is normal pulmonary artery systolic pressure. The tricuspid regurgitant velocity is 1.82 m/s, and with an assumed right atrial pressure of 3 mmHg, the estimated right ventricular systolic pressure is 16.2 mmHg. Left Atrium: Left atrial size was severely dilated. Right Atrium: Right atrial size was severely dilated. Pericardium: There is no evidence of pericardial effusion. Mitral Valve: The mitral valve is grossly normal. There is mild thickening of the mitral valve leaflet(s). There is mild calcification of the mitral valve leaflet(s). Mild mitral annular calcification. Mild to moderate mitral valve regurgitation. Tricuspid Valve: The tricuspid valve is grossly normal. Tricuspid valve regurgitation is moderate to severe. Aortic Valve: The aortic valve is grossly normal. Aortic valve regurgitation is not visualized. Aortic regurgitation PHT measures 530 msec. Aortic valve mean gradient measures 9.0 mmHg. Aortic valve peak gradient measures 17.5 mmHg. Aortic valve area, by  VTI measures 0.84 cm. Pulmonic Valve: The pulmonic valve was normal in structure. Pulmonic valve regurgitation is trivial. Aorta: Aortic  dilatation noted. There is mild dilatation of the ascending aorta, measuring 41 mm. IAS/Shunts: The atrial septum is grossly normal. Additional Comments: A device lead is visualized.  LEFT VENTRICLE PLAX 2D LVIDd:         6.90 cm  Diastology LVIDs:         6.20 cm  LV e' medial:    4.90 cm/s LV PW:         1.00 cm  LV E/e' medial:  20.3 LV IVS:        0.80 cm  LV e' lateral:   8.27 cm/s LVOT diam:     2.20 cm  LV E/e' lateral: 12.0 LV SV:         35 LV SV Index:   16 LVOT Area:     3.80 cm  RIGHT VENTRICLE RV Basal diam:  5.50 cm RV S prime:     5.00 cm/s TAPSE (M-mode): 1.9 cm RVSP:           16.2 mmHg LEFT ATRIUM              Index       RIGHT ATRIUM           Index LA diam:        5.70 cm  2.62 cm/m  RA Pressure: 3.00 mmHg LA Vol (A2C):   152.0 ml 69.96 ml/m RA Area:     28.30 cm LA Vol (A4C):   70.6 ml  32.49 ml/m RA Volume:   96.70 ml  44.50 ml/m LA Biplane Vol: 105.0 ml 48.32 ml/m  AORTIC VALVE AV Area (Vmax):    0.84 cm AV Area (Vmean):   0.85 cm AV Area (VTI):     0.84 cm AV Vmax:           209.00 cm/s AV Vmean:          142.000 cm/s AV VTI:            0.417 m AV Peak Grad:      17.5 mmHg AV Mean Grad:      9.0 mmHg LVOT Vmax:         46.30 cm/s LVOT Vmean:        31.700 cm/s LVOT VTI:          0.092 m LVOT/AV VTI ratio: 0.22 AI PHT:  530 msec  AORTA Ao Root diam: 3.20 cm Ao Asc diam:  4.10 cm MITRAL VALVE                 TRICUSPID VALVE MV Area (PHT):               TR Peak grad:   13.2 mmHg MV Decel Time:               TR Vmax:        182.00 cm/s MR Peak grad:    55.7 mmHg   Estimated RAP:  3.00 mmHg MR Mean grad:    35.0 mmHg   RVSP:           16.2 mmHg MR Vmax:         373.00 cm/s MR Vmean:        273.0 cm/s  SHUNTS MR PISA:         4.02 cm    Systemic VTI:  0.09 m MR PISA Eff ROA: 38 mm      Systemic Diam: 2.20 cm MR PISA Radius:  0.80 cm MV E velocity: 99.40 cm/s MV A velocity: 43.30 cm/s MV E/A ratio:  2.30 Kristeen Miss MD Electronically signed by Kristeen Miss MD Signature  Date/Time: 09/28/2020/3:45:27 PM    Final    [place] EKG SITE RITE  Result Date: 09/29/2020 If Site Rite image not attached, placement could not be confirmed due to current cardiac rhythm.     Medications:     Current Medications: . apixaban  5 mg Oral BID  . atorvastatin  20 mg Oral Daily  . Chlorhexidine Gluconate Cloth  6 each Topical Daily  . dofetilide  125 mcg Oral Q12H  . escitalopram  10 mg Oral Daily  . insulin aspart  0-9 Units Subcutaneous TID WC  . mometasone-formoterol  2 puff Inhalation BID  . potassium chloride  40 mEq Oral Once  . potassium chloride  40 mEq Oral Once  . sodium chloride flush  10-40 mL Intracatheter Q12H  . sodium chloride flush  3 mL Intravenous Q12H     Infusions: . milrinone 0.25 mcg/kg/min (09/29/20 0651)     Assessment/Plan   1. Acute on Chronic Combined Systolic and Diastolic CHF w/ Low Output -  NICM, prior Echos w/ EFs in 20-30% range. Has ICD -  Recent admit 2/22 w/ a/c CHF w/ RHC showing elevated filling pressures and marginal CO -  Now readmitted for a/c CHF w/ worsening functional status and suboptimal response to IV Lasix -  Now on milrinone 0.25. Co-ox on milrinone is marginal at 58%. UOP still sluggish. SCr ok. CVP 16 -  Increase Lasix to 80 mg bid and add 2.5 of metolazone x 1. supp K   -  Continue milrinone at current rate for now  -  Restart home Farxiga 10 mg daily -  BP too soft for ARNi currently. May try low dose losartan qhs if renal fx remains stable  - w/ aortic stenosis, h/o Afib, difficulties tolerating meds w/ low BP, constipation and hearing loss ? Amyloid. Consider PYP scan. No cMRI given ICD    2. Aortic Stenosis - low gradients across the valve by echo and by cath 07/2020 but Echo with AVA over 1.0 cm2, and dimensionless index 0.36 c/w moderate AS   3. CAD - mild nonobstructive CAD on recent [facility] 07/2020.  20% pRCA, 40% 1st marginal and 20% mLAD - c/w medical therapy - on statin, atorva 20  -  no ASA w/ Eliquis  for Afib - no  blocker w/ low output  4. PAF - NSR on tele - on Tikosyn and Eliquis.   5. HTN - controlled/ soft on current regimen - monitor w/ diuresis and milrinone   6. DM - Hgb A1c 6.6  - will restart home Farxiga 10 mg daily   7. OSA  - c/w CPAP   8. Constipation  - place on bowl regimen   9. Hypokalemia - K 3.3. Mg level pending  - supp w/ KCl. Monitor closely w/ diuresis  - monitor QT/QTc w/ Tikosyn    Length of Stay: 1  Brittainy Simmons, PA-C  09/29/2020, 10:15 AM  Advanced Heart Failure Team Pager [phone] (M-F; 7a - 5p)  Please contact [facility] for night-coverage after hours (4p -7a ) and weekends on [email or web address]   Patient seen with PA, agree with the above note.   Patient has a 10 year history of CHF, nonischemic cardiomyopathy. He has a Medtronic ICD.  Recently there was concern for low flow low gradient severe AS, but full evaluation suggests no more than moderate AS.  He is currently in NSR with long first degree AVB on Tikosyn.    He was admitted with NYHA class IIIb-IV progressive exertional dyspnea and volume overload.  Creatinine good at 1.09.  Milrinone started last night empirically, co-ox 58% this morning. CVP 20 on my read this morning.   General: NAD Neck: JVP 16 cm, no thyromegaly or thyroid nodule.  Lungs: Clear to auscultation bilaterally with normal respiratory effort. CV: Lateral PMI.  Heart regular S1/S2, no S3/S4, 2/6 SEM RUSB.  No peripheral edema.  No carotid bruit.  Normal pedal pulses.  Abdomen: Soft, nontender, no hepatosplenomegaly, mild distention.  Skin: Intact without lesions or rashes.  Neurologic: Alert and oriented x 3.  Psych: Normal affect. Extremities: No clubbing or cyanosis.  HEENT: Normal.   1. Acute on chronic systolic CHF: Long-standing NICM, [place] in the past in [place].  Has MDT ICD.  Echo 09/28/20 with EF <20%, severe LV dilation, mild RV dilation with moderate RV dysfunction, mild-moderate MR,  moderate-severe TR, severe biatrial enlargement, no more than moderate AS.  Last cath in 2/22 showed nonobstructive mild CAD, elevated right and left heart filling pressures with low CI 2.04; AS no more than moderate.  Cause of cardiomyopathy uncertain, he is generally in NSR so unlikely to be tachy-mediated CMP.  Possible remote viral infection/viral myocarditis.  NYHA class IV at admission, volume overloaded on exam with CVP 20 currently.  Co-ox marginal at 58% on milrinone 0.25.  Low output HF.  - I will increase milrinone to 0.375 while we are diuresing.  - Lasix 80 mg IV x 1 now then 12 mg/hr infusion.  - Continue Farxiga and add spironolactone 12.5 mg daily.  - No BP room right now for Entresto.  - Long-term, concerned about his trajectory.  Has been doing poorly at home.  Suspect low output HF, would like to fully diurese him, then try to wean milrinone and repeat RHC.  He may be a candidate for LVAD placement.  Creatinine good, I do have concern about his RV (moderate dysfunction).  2. Atrial fibrillation: Paroxysmal.  He is on Tikosyn, QTc around 500 msec on last ECG.  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  - Continue Tikosyn.  - Continue Eliquis.  3. Aortic stenosis: Based on full evaluation (cath and echo), suspect no more than moderate aortic stenosis.  I  do not think he would benefit from TAVR at this point.  4. 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  - statin.   Mobilize with cardiac rehab, PT.   [name] 09/29/2020 12:02 PM

## 2020-09-30 ENCOUNTER — Other Ambulatory Visit: Payer: Self-pay

## 2020-09-30 DIAGNOSIS — I480 Paroxysmal atrial fibrillation: Secondary | ICD-10-CM | POA: Diagnosis not present

## 2020-09-30 DIAGNOSIS — I5023 Acute on chronic systolic (congestive) heart failure: Secondary | ICD-10-CM | POA: Diagnosis not present

## 2020-09-30 DIAGNOSIS — I5043 Acute on chronic combined systolic (congestive) and diastolic (congestive) heart failure: Secondary | ICD-10-CM | POA: Diagnosis not present

## 2020-09-30 DIAGNOSIS — I350 Nonrheumatic aortic (valve) stenosis: Secondary | ICD-10-CM | POA: Diagnosis not present

## 2020-09-30 LAB — BASIC METABOLIC PANEL
Anion gap: 11 (ref 5–15)
Anion gap: 10 (ref 5–15)
BUN: 12 mg/dL (ref 8–23)
BUN: 12 mg/dL (ref 8–23)
CO2: 26 mmol/L (ref 22–32)
CO2: 30 mmol/L (ref 22–32)
Calcium: 8.4 mg/dL — ABNORMAL LOW (ref 8.9–10.3)
Calcium: 8.8 mg/dL — ABNORMAL LOW (ref 8.9–10.3)
Chloride: 100 mmol/L (ref 98–111)
Chloride: 95 mmol/L — ABNORMAL LOW (ref 98–111)
Creatinine, Ser: 0.88 mg/dL (ref 0.61–1.24)
Creatinine, Ser: 0.95 mg/dL (ref 0.61–1.24)
GFR, Estimated: >60 mL/min (ref >60)
GFR, Estimated: >60 mL/min (ref >60)
Glucose, Bld: 136 mg/dL — ABNORMAL HIGH (ref 70–99)
Glucose, Bld: 194 mg/dL — ABNORMAL HIGH (ref 70–99)
Potassium: 2.8 mmol/L — ABNORMAL LOW (ref 3.5–5.1)
Potassium: 3.2 mmol/L — ABNORMAL LOW (ref 3.5–5.1)
Sodium: 137 mmol/L (ref 135–145)
Sodium: 135 mmol/L (ref 135–145)

## 2020-09-30 LAB — COOXEMETRY PANEL
Carboxyhemoglobin: 1.6 % — ABNORMAL HIGH (ref 0.5–1.5)
Carboxyhemoglobin: 1.5 % (ref 0.5–1.5)
Methemoglobin: 0.7 % (ref 0.0–1.5)
Methemoglobin: 0.6 % (ref 0.0–1.5)
O2 Saturation: 59.7 %
O2 Saturation: 56.6 %
Total hemoglobin: 13.8 g/dL (ref 12.0–16.0)
Total hemoglobin: 14.8 g/dL (ref 12.0–16.0)

## 2020-09-30 LAB — CBC
HCT: 41.7 % (ref 39.0–52.0)
Hemoglobin: 13.9 g/dL (ref 13.0–17.0)
MCH: 29.3 pg (ref 26.0–34.0)
MCHC: 33.3 g/dL (ref 30.0–36.0)
MCV: 87.8 fL (ref 80.0–100.0)
Platelets: 213 10*3/uL (ref 150–400)
RBC: 4.75 MIL/uL (ref 4.22–5.81)
RDW: 16.3 % — ABNORMAL HIGH (ref 11.5–15.5)
WBC: 9 10*3/uL (ref 4.0–10.5)
nRBC: 0 % (ref 0.0–0.2)

## 2020-09-30 LAB — GLUCOSE, CAPILLARY
Glucose-Capillary: 116 mg/dL — ABNORMAL HIGH (ref 70–99)
Glucose-Capillary: 144 mg/dL — ABNORMAL HIGH (ref 70–99)
Glucose-Capillary: 150 mg/dL — ABNORMAL HIGH (ref 70–99)
Glucose-Capillary: 182 mg/dL — ABNORMAL HIGH (ref 70–99)

## 2020-09-30 LAB — MAGNESIUM: Magnesium: 2 mg/dL (ref 1.7–2.4)

## 2020-09-30 MED ORDER — POTASSIUM CHLORIDE CRYS ER 20 MEQ PO TBCR
40.0000 meq | EXTENDED_RELEASE_TABLET | Freq: Once | ORAL | Status: AC
  Administered 2020-10-01: 40 meq via ORAL
  Filled 2020-09-30: qty 2

## 2020-09-30 MED ORDER — POTASSIUM CHLORIDE CRYS ER 20 MEQ PO TBCR
40.0000 meq | EXTENDED_RELEASE_TABLET | Freq: Once | ORAL | Status: AC
  Administered 2020-09-30: 40 meq via ORAL
  Filled 2020-09-30: qty 2

## 2020-09-30 MED ORDER — DIGOXIN 125 MCG PO TABS
0.1250 mg | ORAL_TABLET | Freq: Every day | ORAL | Status: DC
  Administered 2020-09-30 – 2020-10-09 (×10): 0.125 mg via ORAL
  Filled 2020-09-30 (×10): qty 1

## 2020-09-30 MED ORDER — SODIUM CHLORIDE 0.9 % IV SOLN: INTRAVENOUS | Status: DC

## 2020-09-30 MED ORDER — SPIRONOLACTONE 25 MG PO TABS
25.0000 mg | ORAL_TABLET | Freq: Every day | ORAL | Status: DC
  Administered 2020-10-01 – 2020-10-09 (×9): 25 mg via ORAL
  Filled 2020-09-30 (×9): qty 1

## 2020-09-30 MED ORDER — SPIRONOLACTONE 12.5 MG HALF TABLET
12.5000 mg | ORAL_TABLET | Freq: Once | ORAL | Status: AC
  Administered 2020-09-30: 12.5 mg via ORAL
  Filled 2020-09-30: qty 1

## 2020-09-30 MED ORDER — POTASSIUM CHLORIDE CRYS ER 20 MEQ PO TBCR
60.0000 meq | EXTENDED_RELEASE_TABLET | Freq: Once | ORAL | Status: AC
  Administered 2020-09-30: 60 meq via ORAL
  Filled 2020-09-30: qty 3

## 2020-09-30 MED ORDER — METOLAZONE 2.5 MG PO TABS
2.5000 mg | ORAL_TABLET | Freq: Once | ORAL | Status: AC
  Administered 2020-09-30: 2.5 mg via ORAL
  Filled 2020-09-30: qty 1

## 2020-09-30 MED ORDER — NITROGLYCERIN 0.4 MG SL SUBL
SUBLINGUAL_TABLET | SUBLINGUAL | Status: AC
  Filled 2020-09-30: qty 1

## 2020-09-30 MED ORDER — POTASSIUM CHLORIDE CRYS ER 20 MEQ PO TBCR
40.0000 meq | EXTENDED_RELEASE_TABLET | ORAL | Status: DC
  Administered 2020-09-30: 40 meq via ORAL
  Filled 2020-09-30: qty 2

## 2020-09-30 MED ORDER — POLYETHYLENE GLYCOL 3350 17 G PO PACK
17.0000 g | PACK | Freq: Two times a day (BID) | ORAL | Status: DC
  Administered 2020-09-30 – 2020-10-09 (×13): 17 g via ORAL
  Filled 2020-09-30 (×16): qty 1

## 2020-09-30 MED ORDER — NITROGLYCERIN 0.4 MG SL SUBL
0.4000 mg | SUBLINGUAL_TABLET | SUBLINGUAL | Status: AC | PRN
  Administered 2020-09-30 (×3): 0.4 mg via SUBLINGUAL

## 2020-09-30 NOTE — Progress Notes (Signed)
PT Cancellation Note  Patient Details Name: NERY FRAPPIER MRN: [id number] DOB: 03-03-1946   Cancelled Treatment:    Reason Eval/Treat Not Completed: Other (comment).  Pt asleep and could not awaken.  Follow up as time and pt allow.   Ivar Drape 09/30/2020, 12:03 PM   Samul Dada, PT MS Acute Rehab Dept. Number: [facility] [id number] and [facility] [phone]

## 2020-09-30 NOTE — Progress Notes (Signed)
RT note. Pt. Will place self on home BIPAP when ready for bed

## 2020-09-30 NOTE — Progress Notes (Signed)
PT Cancellation Note  Patient Details Name: PATTERSON HOLLENBAUGH MRN: [id number] DOB: 1945/12/24   Cancelled Treatment:    Reason Eval/Treat Not Completed: Other (comment).  Pt was busy with meal and will try again at another time.   Ivar Drape 09/30/2020, 8:37 AM   Samul Dada, PT MS Acute Rehab Dept. Number: [facility] [id number] and [facility] [phone]

## 2020-09-30 NOTE — Progress Notes (Addendum)
PROGRESS NOTE    Chad Wilkerson  XGZ:[id number] DOB: 08-14-1945 DOA: 09/28/2020 PCP: [name], NP    Chief Complaint  Patient presents with  . Shortness of Breath   Brief Narrative:  Chad Wilkerson is Chad Wilkerson 75 y.o. male with medical history significant for chronic combined systolic and diastolic CHF (EF less than 20%, G3 DD by TTE 09/28/2020), NICM, s/p ICD, atrial fibrillation on Eliquis and Tikosyn, T2DM, HTN, and OSA on BiPAP who presented to the ED for evaluation of shortness of breath and weightgain.  Patient reports 1 week of progressive dyspnea, DOE, orthopnea weight gain.  He was seen in follow-up in his cardiology office earlier today.  He underwent echocardiogram which showed EF <20%.  He had significant shortness of breath therefore was sent to the ED for further management.  He's been admitted with HF exacerbation.  Assessment & Plan:   Principal Problem:   Acute on chronic combined systolic (congestive) and diastolic (congestive) heart failure (HCC) Active Problems:   Sleep apnea   Paroxysmal atrial fibrillation (HCC)   ICD (implantable cardioverter-defibrillator) in place  Chad Wilkerson is Chad Wilkerson 76 y.o. male with medical history significant for chronic combined systolic and diastolic CHF (EF less than 20%, G3 DD by TTE 09/28/2020), NICM, s/p ICD, atrial fibrillation on Eliquis and Tikosyn, T2DM, HTN, and OSA on BiPAP who is admitted with acute on chronic combined systolic and diastolic CHF.  Acute on chronic combined systolic and diastolic CHF s/p ICD: TTE 4/19 prior to admit showed EF <20% with G3 DD. Also moderately reduced RVSF.   Cardiology following and managing diuresis.  At risk for low output heart failure and therefore started on milrinone. -Cardiology following - appreciate assistance -> milrinone, lasix gtt, farxiga, spironolactone increased to 25 mg daily - adding digoxin - possibly repeat RHC after diuresed, possible LVAD candidate? -Monitor strict  I/O's and daily weights  Hypokalemia Replace and follow  Hypotension: Holding home Coreg.  On milrinone as above.  IV lasix.  Spironolactone.   Paroxysmal atrial fibrillation: Continue Eliquis and Tikosyn.  Holding Coreg.  Type 2 diabetes: Hold home metformin.  Continue farxiga.  SSI.   OSA: Continue BiPAP nightly.  DVT prophylaxis: eliquis Code Status: DNR Family Communication: none at bedside Disposition:   Status is: Inpatient  Remains inpatient appropriate because:Inpatient level of care appropriate due to severity of illness   Dispo: The patient is from: home              Anticipated d/c is to: pending              Patient currently is not medically stable to d/c.   Difficult to place patient No       Consultants:   cardiology  Procedures: Echo IMPRESSIONS    1. Left ventricular ejection fraction, by estimation, is <20%. The left  ventricle has severely decreased function. The left ventricle demonstrates  global hypokinesis. The left ventricular internal cavity size was severely  dilated. Left ventricular  diastolic parameters are consistent with Grade III diastolic dysfunction  (restrictive).  2. Right ventricular systolic function is moderately reduced. The right  ventricular size is mildly enlarged. There is normal pulmonary artery  systolic pressure.  3. Left atrial size was severely dilated.  4. Right atrial size was severely dilated.  5. The mitral valve is grossly normal. Mild to moderate mitral valve  regurgitation.  6. Tricuspid valve regurgitation is moderate to severe.  7. The aortic valve is grossly normal.  Aortic valve regurgitation is not  visualized.  8. Aortic dilatation noted. There is mild dilatation of the ascending  aorta, measuring 41 mm.  Antimicrobials: Anti-infectives (From admission, onward)   None         Subjective: No new complaints Maybe feeling Chad Wilkerson little better  Objective: Vitals:   09/30/20  0347 09/30/20 0717 09/30/20 0817 09/30/20 1050  BP: 104/70 99/66  103/70  Pulse: 77 79  79  Resp: 18 19  20   Temp: 97.9 F (36.6 C) 97.9 F (36.6 C)  99.2 F (37.3 C)  TempSrc: Oral Oral  Oral  SpO2: 94% 97% 97% 94%  Weight: 107.5 kg     Height:        Intake/Output Summary (Last 24 hours) at 09/30/2020 1502 Last data filed at 09/30/2020 1300 Gross per 24 hour  Intake 1011.23 ml  Output 5800 ml  Net -4788.77 ml   Filed Weights   09/28/20 1554 09/29/20 0005 09/30/20 0347  Weight: 108.8 kg 108.4 kg 107.5 kg    Examination:  General: No acute distress. Cardiovascular: irregularly irregular  Lungs: Clear to auscultation bilaterally Abdomen: Soft, nontender, nondistended  Neurological: Alert and oriented 3. Moves all extremities 4. Cranial nerves II through XII grossly intact. Skin: Warm and dry. No rashes or lesions. Extremities: dependent edema    Data Reviewed: I have personally reviewed following labs and imaging studies  CBC: Recent Labs  Lab 09/28/20 1612 09/29/20 0040 09/29/20 1419 09/30/20 0429  WBC 8.9 9.7 8.2 9.0  NEUTROABS 6.8  --   --   --   HGB 15.0 14.2 13.2 13.9  HCT 45.8 43.3 40.1 41.7  MCV 89.3 88.4 88.7 87.8  PLT 257 234 200 213    Basic Metabolic Panel: Recent Labs  Lab 09/28/20 1612 09/29/20 0040 09/29/20 1419 09/30/20 0429  NA 133* 135  --  137  K 3.7 3.3*  --  2.8*  CL 98 97*  --  100  CO2 26 26  --  26  GLUCOSE 131* 191*  --  136*  BUN 20 19  --  12  CREATININE 1.03 1.09  --  0.88  CALCIUM 8.9 8.7*  --  8.4*  MG  --  2.1 2.0 2.0    GFR: Estimated Creatinine Clearance: 88.3 mL/min (by C-G formula based on SCr of 0.88 mg/dL).  Liver Function Tests: Recent Labs  Lab 09/28/20 1612  AST 33  ALT 25  ALKPHOS 77  BILITOT 1.5*  PROT 6.9  ALBUMIN 3.6    CBG: Recent Labs  Lab 09/29/20 1113 09/29/20 1604 09/29/20 2052 09/30/20 0616 09/30/20 1051  GLUCAP 154* 150* 180* 144* 150*     Recent Results (from the past  240 hour(s))  Resp Panel by RT-PCR (Flu Chad Wilkerson, Covid) Nasopharyngeal Swab     Status: None   Collection Time: 09/28/20  4:13 PM   Specimen: Nasopharyngeal Swab; Nasopharyngeal(NP) swabs in vial transport medium  Result Value Ref Range Status   SARS Coronavirus 2 by RT PCR NEGATIVE NEGATIVE Final    Comment: (NOTE) SARS-CoV-2 target nucleic acids are NOT DETECTED.  The SARS-CoV-2 RNA is generally detectable in upper respiratory specimens during the acute phase of infection. The lowest concentration of SARS-CoV-2 viral copies this assay can detect is 138 copies/mL. Chad Wilkerson negative result does not preclude SARS-Cov-2 infection and should not be used as the sole basis for treatment or other patient management decisions. Chad Wilkerson negative result may occur with  improper specimen collection/handling, submission of specimen  other than nasopharyngeal swab, presence of viral mutation(s) within the areas targeted by this assay, and inadequate number of viral copies(<138 copies/mL). Chad Wilkerson negative result must be combined with clinical observations, patient history, and epidemiological information. The expected result is Negative.  Fact Sheet for Patients:  BloggerCourse.com  Fact Sheet for Healthcare Providers:  SeriousBroker.it  This test is no t yet approved or cleared by the [place] FDA and  has been authorized for detection and/or diagnosis of SARS-CoV-2 by FDA under an Emergency Use Authorization (EUA). This EUA will remain  in effect (meaning this test can be used) for the duration of the COVID-19 declaration under Section 564(b)(1) of the Act, 21 U.S.C.section 360bbb-3(b)(1), unless the authorization is terminated  or revoked sooner.       Influenza Chad Wilkerson by PCR NEGATIVE NEGATIVE Final   Influenza B by PCR NEGATIVE NEGATIVE Final    Comment: (NOTE) The Xpert Xpress SARS-CoV-2/FLU/RSV plus assay is intended as an aid in the diagnosis of influenza  from Nasopharyngeal swab specimens and should not be used as Chad Wilkerson sole basis for treatment. Nasal washings and aspirates are unacceptable for Xpert Xpress SARS-CoV-2/FLU/RSV testing.  Fact Sheet for Patients: BloggerCourse.com  Fact Sheet for Healthcare Providers: SeriousBroker.it  This test is not yet approved or cleared by the [place] FDA and has been authorized for detection and/or diagnosis of SARS-CoV-2 by FDA under an Emergency Use Authorization (EUA). This EUA will remain in effect (meaning this test can be used) for the duration of the COVID-19 declaration under Section 564(b)(1) of the Act, 21 U.S.C. section 360bbb-3(b)(1), unless the authorization is terminated or revoked.  Performed at [facility], [address], [place], [place] [address]          Radiology Studies: DG Chest Port 1 View  Result Date: 09/28/2020 CLINICAL DATA:  Worsening shortness of breath. EXAM: PORTABLE CHEST 1 VIEW COMPARISON:  None. FINDINGS: Left chest wall pacemaker with single lead terminating in the right ventricle. Moderate cardiomegaly. Pulmonary vascular congestion with mild diffuse interstitial thickening. No focal consolidation, pleural effusion, or pneumothorax. No acute osseous abnormality. IMPRESSION: 1. Cardiomegaly with mild interstitial pulmonary edema. Electronically Signed   By: Obie Dredge M.D.   On: 09/28/2020 16:51   [place] EKG SITE RITE  Result Date: 09/29/2020 If Site Rite image not attached, placement could not be confirmed due to current cardiac rhythm.       Scheduled Meds: . apixaban  5 mg Oral BID  . atorvastatin  20 mg Oral Daily  . Chlorhexidine Gluconate Cloth  6 each Topical Daily  . dapagliflozin propanediol  10 mg Oral Daily  . digoxin  0.125 mg Oral Daily  . docusate sodium  100 mg Oral BID  . dofetilide  125 mcg Oral Q12H  . escitalopram  10 mg Oral Daily  . insulin aspart  0-9 Units  Subcutaneous TID WC  . mometasone-formoterol  2 puff Inhalation BID  . polyethylene glycol  17 g Oral BID  . potassium chloride  40 mEq Oral Once  . potassium chloride  40 mEq Oral Once  . sodium chloride flush  10-40 mL Intracatheter Q12H  . sodium chloride flush  3 mL Intravenous Q12H  . [START ON 10/01/2020] spironolactone  25 mg Oral Daily   Continuous Infusions: . furosemide (LASIX) 200 mg in dextrose 5% 100 mL (2mg /mL) infusion 12 mg/hr (09/30/20 0310)  . milrinone 0.375 mcg/kg/min (09/30/20 0826)     LOS: 2 days    Time  spent: over 30 min    Lacretia Nicks, MD [facility]   To contact the attending provider between 7A-7P or the covering provider during after hours 7P-7A, please log into the web site [email or web address] and access using universal [place] password for that web site. If you do not have the password, please call the hospital operator.  09/30/2020, 3:02 PM

## 2020-09-30 NOTE — Progress Notes (Addendum)
Patient ID: Chad Wilkerson, male   DOB: [birth year], 75 y.o.   MRN: [id number]     Advanced Heart Failure Rounding Note  PCP-Cardiologist: Christell Constant, MD   Subjective:    I/O net negative 2682, weight trending down.  K low at 2.8, creatinine stable.  SBP generally 100s.  Co-ox 60% on milrinone 0.375.  He remains on Lasix gtt 12 mg/hr.    He is in NSR with PVCs.    Objective:   Weight Range: 107.5 kg Body mass index is 35.53 kg/m.   Vital Signs:   Temp:  [97.9 F (36.6 C)-99.2 F (37.3 C)] 97.9 F (36.6 C) (04/21 0717) Pulse Rate:  [67-79] 79 (04/21 0717) Resp:  [18-20] 19 (04/21 0717) BP: (99-109)/(66-76) 99/66 (04/21 0717) SpO2:  [90 %-97 %] 97 % (04/21 0817) FiO2 (%):  [32 %] 32 % (04/21 0817) Weight:  [107.5 kg] 107.5 kg (04/21 0347) Last BM Date: 09/27/20  Weight change: Filed Weights   09/28/20 1554 09/29/20 0005 09/30/20 0347  Weight: 108.8 kg 108.4 kg 107.5 kg    Intake/Output:   Intake/Output Summary (Last 24 hours) at 09/30/2020 0856 Last data filed at 09/30/2020 0809 Gross per 24 hour  Intake 1542.68 ml  Output 4975 ml  Net -3432.32 ml      Physical Exam    General:  Well appearing. No resp difficulty HEENT: Normal Neck: Supple. JVP 14+. Carotids 2+ bilat; no bruits. No lymphadenopathy or thyromegaly appreciated. Cor: PMI nondisplaced. Regular rate & rhythm. No rubs, gallops or murmurs. Lungs: Clear Abdomen: Soft, nontender, mildly distended. No hepatosplenomegaly. No bruits or masses. Good bowel sounds. Extremities: No cyanosis, clubbing, rash, edema Neuro: Alert & orientedx3, cranial nerves grossly intact. moves all 4 extremities w/o difficulty. 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   Basic Metabolic Panel Recent Labs    69/67/89 0040 09/29/20 1419 09/30/20 0429  NA 135  --  137  K 3.3*  --  2.8*  CL 97*  --  100  CO2 26  --  26  GLUCOSE 191*  --  136*  BUN 19  --  12  CREATININE 1.09  --  0.88  CALCIUM 8.7*  --  8.4*  MG 2.1 2.0 2.0   Liver Function Tests Recent Labs    09/28/20 1612  AST 33  ALT 25  ALKPHOS 77  BILITOT 1.5*  PROT 6.9  ALBUMIN 3.6   Recent Labs    09/28/20 1612  LIPASE 59*   Cardiac Enzymes No results for input(s): CKTOTAL, CKMB, CKMBINDEX, TROPONINI in the last 72 hours.  BNP: BNP (last 3 results) Recent Labs    09/28/20 1613  BNP 3,328.4*    ProBNP (last 3 results) Recent Labs    04/01/20 1509  PROBNP 5,001*     D-Dimer No results for input(s): DDIMER in the last 72 hours. Hemoglobin A1C Recent Labs    09/29/20 0040  HGBA1C  6.6*   Fasting Lipid Panel No results for input(s): CHOL, HDL, LDLCALC, TRIG, CHOLHDL, LDLDIRECT in the last 72 hours. Thyroid Function Tests No results for input(s): TSH, T4TOTAL, T3FREE, THYROIDAB in the last 72 hours.  Invalid input(s): FREET3  Other results:   Imaging     No results found.   Medications:     Scheduled Medications: . apixaban  5 mg Oral BID  . atorvastatin  20 mg Oral Daily  . Chlorhexidine Gluconate Cloth  6 each Topical Daily  . dapagliflozin propanediol  10 mg Oral Daily  . digoxin  0.125 mg Oral Daily  . docusate sodium  100 mg Oral BID  . dofetilide  125 mcg Oral Q12H  . escitalopram  10 mg Oral Daily  . insulin aspart  0-9 Units Subcutaneous TID WC  . metolazone  2.5 mg Oral Once  . mometasone-formoterol  2 puff Inhalation BID  . potassium chloride  40 mEq Oral Q4H  . sodium chloride flush  10-40 mL Intracatheter Q12H  . sodium chloride flush  3 mL Intravenous Q12H  . spironolactone  12.5 mg Oral Once  . [START ON 10/01/2020] spironolactone  25  mg Oral Daily     Infusions: . furosemide (LASIX) 200 mg in dextrose 5% 100 mL (2mg /mL) infusion 12 mg/hr (09/30/20 0310)  . milrinone 0.375 mcg/kg/min (09/30/20 0826)     PRN Medications:  acetaminophen **OR** acetaminophen, ondansetron **OR** ondansetron (ZOFRAN) IV, polyethylene glycol, sodium chloride flush   Assessment/Plan   1. Acute on chronic systolic CHF: Long-standing NICM, followed in the past in [place].  Has MDT ICD.  Echo 09/28/20 with EF <20%, severe LV dilation, mild RV dilation with moderate RV dysfunction, mild-moderate MR, moderate-severe TR, severe biatrial enlargement, no more than moderate AS.  Last cath in 2/22 showed nonobstructive mild CAD, elevated right and left heart filling pressures with low CI 2.04; AS no more than moderate.  Cause of cardiomyopathy uncertain, he is generally in NSR so unlikely to be tachy-mediated CMP.  Possible remote viral infection/viral myocarditis.  NYHA class IV at admission, volume overloaded on exam with CVP 19 currently.  Co-ox 60% on milrinone 0.375.  Low output HF.  He is on Lasix gtt 12 mg/hr with good diuresis yesterday.  - Continue milrinone 0.375 while we are diuresing.  - Continue Lasix gtt 12 mg/hr and will give metolazone 2.5 mg x 1.  - Add digoxin 0.125 daily - Aggressive K repletion and repeat BMET in pm, discussed with pharmacy.  Follow Mg.  - Continue Farxiga and increase spironolactone to 25 mg daily.  - No BP room right now for Entresto.  - Long-term, concerned about his trajectory.  Has been doing poorly at home.  Suspect low output HF, would like to fully diurese him, then try to wean milrinone and repeat RHC.  He may be a candidate for LVAD placement.  Creatinine good, I do have concern about his RV (moderate dysfunction).  2. Atrial fibrillation: Paroxysmal.  He is on Tikosyn, QTc around 500 msec on last ECG.  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  - Continue Tikosyn.  - Continue Eliquis.  - Replace K and Mg.   3. Aortic stenosis: Based on full evaluation (cath and echo), suspect no more than moderate aortic stenosis.  I do not think he would benefit from TAVR at this point.  4. 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  - statin.   Mobilize with cardiac rehab.   Length of  Stay: 2  [name], MD  09/30/2020, 8:56 AM  Advanced Heart Failure Team Pager [phone] (M-F; 7a - 5p)  Please contact [facility] for night-coverage after hours (5p -7a ) and weekends on [email or web address]

## 2020-09-30 NOTE — Progress Notes (Signed)
Walked into room to find patient c/o of chest pain. States it is sharp and is a pulled muscle. Pt unable to lie still. EKG obtained and MD notified. Nitro x3 given. Pts pain fluctuating. MD states pain is mostly from CAD and no new orders given.  Pt requests tylenol and an ice pack. Both given to patient. Pain continues to fluctuate until it eventually dissipates. Pts significant other updated and requests a call back during the night to check on the patient.  Pt now resting.

## 2020-09-30 NOTE — Progress Notes (Signed)
CARDIAC REHAB PHASE I   PRE:  Rate/Rhythm: 80 SR     BP: sitting 100/75    SaO2: 96 3L  MODE:  Ambulation: 200 ft   POST:  Rate/Rhythm: 92 SR    BP: sitting 103/75     SaO2: 96 3L  Mod assist to EOB. Stood independently holding to RW. Used 3L, standby assist. Slow and steady. Rest x2. Sts this is the farthest he has been able to walk in a while. To recliner, VSS. Pt HOH, pleasant. [address] [place], [place] 09/30/2020 3:30 PM

## 2020-09-30 NOTE — TOC Progression Note (Signed)
Transition of Care [facility]) - Progression Note    Patient Details  Name: Chad Wilkerson MRN: [id number] Date of Birth: 02/14/1946  Transition of [facility]) CM/SW Contact  Leone Haven, RN Phone Number: 09/30/2020, 4:03 PM  Clinical Narrative:    Patient with HF, on milrinone drip, and lasix drip, TOC will cont to follow for dc needs.        Expected Discharge Plan and Services                                                 Social Determinants of Health (SDOH) Interventions    Readmission Risk Interventions No flowsheet data found.

## 2020-10-01 DIAGNOSIS — I4901 Ventricular fibrillation: Secondary | ICD-10-CM

## 2020-10-01 DIAGNOSIS — I5043 Acute on chronic combined systolic (congestive) and diastolic (congestive) heart failure: Secondary | ICD-10-CM | POA: Diagnosis not present

## 2020-10-01 DIAGNOSIS — I350 Nonrheumatic aortic (valve) stenosis: Secondary | ICD-10-CM | POA: Diagnosis not present

## 2020-10-01 DIAGNOSIS — I480 Paroxysmal atrial fibrillation: Secondary | ICD-10-CM | POA: Diagnosis not present

## 2020-10-01 DIAGNOSIS — I5023 Acute on chronic systolic (congestive) heart failure: Secondary | ICD-10-CM | POA: Diagnosis not present

## 2020-10-01 LAB — CBC
HCT: 43.1 % (ref 39.0–52.0)
Hemoglobin: 14.2 g/dL (ref 13.0–17.0)
MCH: 29.2 pg (ref 26.0–34.0)
MCHC: 32.9 g/dL (ref 30.0–36.0)
MCV: 88.5 fL (ref 80.0–100.0)
Platelets: 231 10*3/uL (ref 150–400)
RBC: 4.87 MIL/uL (ref 4.22–5.81)
RDW: 16.3 % — ABNORMAL HIGH (ref 11.5–15.5)
WBC: 9.3 10*3/uL (ref 4.0–10.5)
nRBC: 0 % (ref 0.0–0.2)

## 2020-10-01 LAB — BASIC METABOLIC PANEL
Anion gap: 9 (ref 5–15)
Anion gap: 13 (ref 5–15)
BUN: 14 mg/dL (ref 8–23)
BUN: 14 mg/dL (ref 8–23)
CO2: 31 mmol/L (ref 22–32)
CO2: 33 mmol/L — ABNORMAL HIGH (ref 22–32)
Calcium: 9.1 mg/dL (ref 8.9–10.3)
Calcium: 9.3 mg/dL (ref 8.9–10.3)
Chloride: 96 mmol/L — ABNORMAL LOW (ref 98–111)
Chloride: 89 mmol/L — ABNORMAL LOW (ref 98–111)
Creatinine, Ser: 0.98 mg/dL (ref 0.61–1.24)
Creatinine, Ser: 0.94 mg/dL (ref 0.61–1.24)
GFR, Estimated: >60 mL/min (ref >60)
GFR, Estimated: >60 mL/min (ref >60)
Glucose, Bld: 145 mg/dL — ABNORMAL HIGH (ref 70–99)
Glucose, Bld: 187 mg/dL — ABNORMAL HIGH (ref 70–99)
Potassium: 3.3 mmol/L — ABNORMAL LOW (ref 3.5–5.1)
Potassium: 2.6 mmol/L — CL (ref 3.5–5.1)
Sodium: 136 mmol/L (ref 135–145)
Sodium: 135 mmol/L (ref 135–145)

## 2020-10-01 LAB — COOXEMETRY PANEL
Carboxyhemoglobin: 1.6 % — ABNORMAL HIGH (ref 0.5–1.5)
Methemoglobin: 0.5 % (ref 0.0–1.5)
O2 Saturation: 58 %
Total hemoglobin: 15.2 g/dL (ref 12.0–16.0)

## 2020-10-01 LAB — GLUCOSE, CAPILLARY
Glucose-Capillary: 164 mg/dL — ABNORMAL HIGH (ref 70–99)
Glucose-Capillary: 174 mg/dL — ABNORMAL HIGH (ref 70–99)
Glucose-Capillary: 254 mg/dL — ABNORMAL HIGH (ref 70–99)
Glucose-Capillary: 187 mg/dL — ABNORMAL HIGH (ref 70–99)
Glucose-Capillary: 167 mg/dL — ABNORMAL HIGH (ref 70–99)

## 2020-10-01 LAB — MAGNESIUM
Magnesium: 2.2 mg/dL (ref 1.7–2.4)
Magnesium: 2.2 mg/dL (ref 1.7–2.4)

## 2020-10-01 MED ORDER — METOLAZONE 2.5 MG PO TABS: 2.5000 mg | ORAL_TABLET | Freq: Once | ORAL | Status: DC

## 2020-10-01 MED ORDER — AMIODARONE HCL IN DEXTROSE 360-4.14 MG/200ML-% IV SOLN: 60.0000 mg/h | INTRAVENOUS | Status: DC

## 2020-10-01 MED ORDER — AMIODARONE HCL IN DEXTROSE 360-4.14 MG/200ML-% IV SOLN: 30.0000 mg/h | INTRAVENOUS | Status: DC

## 2020-10-01 MED ORDER — AMIODARONE HCL IN DEXTROSE 360-4.14 MG/200ML-% IV SOLN
30.0000 mg/h | INTRAVENOUS | Status: DC
  Administered 2020-10-02 – 2020-10-04 (×4): 30 mg/h via INTRAVENOUS
  Filled 2020-10-01 (×4): qty 200

## 2020-10-01 MED ORDER — ALBUTEROL SULFATE (2.5 MG/3ML) 0.083% IN NEBU
2.5000 mg | INHALATION_SOLUTION | RESPIRATORY_TRACT | Status: DC | PRN
  Administered 2020-10-01 (×2): 2.5 mg via RESPIRATORY_TRACT
  Filled 2020-10-01 (×2): qty 3

## 2020-10-01 MED ORDER — LOSARTAN POTASSIUM 25 MG PO TABS
12.5000 mg | ORAL_TABLET | Freq: Every day | ORAL | Status: DC
  Administered 2020-10-01 – 2020-10-05 (×5): 12.5 mg via ORAL
  Filled 2020-10-01 (×5): qty 1

## 2020-10-01 MED ORDER — AMIODARONE HCL IN DEXTROSE 360-4.14 MG/200ML-% IV SOLN
60.0000 mg/h | INTRAVENOUS | Status: AC
  Administered 2020-10-01 – 2020-10-02 (×3): 60 mg/h via INTRAVENOUS
  Filled 2020-10-01: qty 200

## 2020-10-01 MED ORDER — POTASSIUM CHLORIDE CRYS ER 20 MEQ PO TBCR
40.0000 meq | EXTENDED_RELEASE_TABLET | ORAL | Status: DC
  Filled 2020-10-01: qty 2

## 2020-10-01 MED ORDER — POTASSIUM CHLORIDE CRYS ER 20 MEQ PO TBCR
40.0000 meq | EXTENDED_RELEASE_TABLET | ORAL | Status: AC
  Administered 2020-10-01 – 2020-10-02 (×3): 40 meq via ORAL
  Filled 2020-10-01 (×3): qty 2

## 2020-10-01 MED ORDER — POTASSIUM CHLORIDE CRYS ER 20 MEQ PO TBCR
40.0000 meq | EXTENDED_RELEASE_TABLET | ORAL | Status: DC
  Administered 2020-10-01 (×3): 40 meq via ORAL
  Filled 2020-10-01 (×2): qty 2

## 2020-10-01 MED ORDER — AMIODARONE LOAD VIA INFUSION
150.0000 mg | Freq: Once | INTRAVENOUS | Status: AC
  Administered 2020-10-01: 150 mg via INTRAVENOUS
  Filled 2020-10-01: qty 83.34

## 2020-10-01 MED ORDER — AMIODARONE HCL IN DEXTROSE 360-4.14 MG/200ML-% IV SOLN
60.0000 mg/h | INTRAVENOUS | Status: DC
  Administered 2020-10-01: 60 mg/h via INTRAVENOUS
  Filled 2020-10-01 (×2): qty 200

## 2020-10-01 MED ORDER — METOLAZONE 2.5 MG PO TABS
2.5000 mg | ORAL_TABLET | Freq: Once | ORAL | Status: AC
  Administered 2020-10-01: 2.5 mg via ORAL
  Filled 2020-10-01: qty 1

## 2020-10-01 MED ORDER — POTASSIUM CHLORIDE 10 MEQ/100ML IV SOLN
10.0000 meq | INTRAVENOUS | Status: AC
  Administered 2020-10-01 (×4): 10 meq via INTRAVENOUS
  Filled 2020-10-01 (×4): qty 100

## 2020-10-01 NOTE — Consult Note (Addendum)
ELECTROPHYSIOLOGY CONSULT NOTE    Patient ID: Chad Wilkerson MRN: [id number], DOB/AGE: [id number] y.o.  Admit date: 09/28/2020 Date of Consult: 10/01/2020  Primary Physician: [name], NP Primary Cardiologist: Christell Constant, MD  Electrophysiologist: Dr. [name]  Referring Provider: Dr. Shirlee Latch  Patient Profile: Chad Wilkerson is a 75 y.o. male with a history of chronic combined CHF, NICM, s/p ICD, AF on Eliquis and Tikosyn, DM2, HTN, and OSA on BiPAP who is being seen today for the evaluation of VT at the request of Dr. Shirlee Latch.  HPI:  Chad Wilkerson is a 74 y.o. male with medical history as above.   Pt admitted 07/2020 for CHF. 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 RHC showed elevated filling pressures and marginal output. CO 4.43 L/min, CI 2.04. Also noted to have moderate AS.  Pt last seen by Dr. [name] 08/17/20 for his PAF and tikosyn management. QTc was ~500 but had been stable for quite some time so watchful waiting was pursued as was on lowest dose tikosyn having prevoiusly failed amiodarone due to non-specific symptoms of malaise after 3-4 days.  Pt admitted 4/19 with worsening DOE. Found to have 8-10 lbs of weight gain and felt to have "cold and wet" physiology. Started on milrinone empirically with poor diuresis.  PICC line placed and initial coox borderline ON milrinone.    Pertinent labs on admission include K 3.7, Cr 1.03, Mg 2.1.  EKG on arrival showed NSR at 82 bpm with QTc ~450 ms.  Pt has had worsening ectopy on milrinone, and this afternoon had VT terminated by his ICD. Plan had been to start on amiodarone but patient refusing due to previous issues. EP asked to see for guidance in regards to AAD.   Currently, he states he is feeling better but continues to have conversational dyspnea with 4-5 words. He immediately tells me his primary goal is to feel better. He wants to get home and "live a  normal life". Wants to be able to go for walks and go to the grocery store. His partner echoes this.  He does not remember his specific issues with amiodarone in the past. He states he was on it for 3-4 days and just felt "terrible". At one point he was unsure if he would "make it through the night" but he can't be any more specific than that. He denies chest pain, syncope, or near syncope. He did not full understand what had happened with his defibrillator until our discussion.    Past Medical History:  Diagnosis Date  . Adjustment disorder   . Atrial fibrillation (HCC)   . Benign colon polyp   . CHF (congestive heart failure) (HCC)   . Diabetes mellitus without complication (HCC)   . Diverticulitis   . Essential hypertension   . Heart disease   . Hypogonadism male   . NICM (nonischemic cardiomyopathy) (HCC) 07/20/2020  . Sleep apnea   . Urine incontinence   . Vitamin D deficiency      Surgical History:  Past Surgical History:  Procedure Laterality Date  . CHOLECYSTECTOMY    . RIGHT/LEFT HEART CATH AND CORONARY ANGIOGRAPHY N/A 07/19/2020   Procedure: RIGHT/LEFT HEART CATH AND CORONARY ANGIOGRAPHY;  Surgeon: Kathleene Hazel, MD;  Location: [facility];  Service: Cardiovascular;  Laterality: N/A;  . TONSILLECTOMY       Medications Prior to Admission  Medication Sig Dispense Refill Last Dose  .  Arginine (L-ARGININE-500) 500 MG CAPS Take 500 mg by mouth daily.   09/28/2020 at Unknown time  . Ascorbic Acid (VITAMIN C) 1000 MG tablet Take 1,000 mg by mouth 2 (two) times daily.   09/28/2020 at Unknown time  . atorvastatin (LIPITOR) 20 MG tablet Take 20 mg by mouth daily.   09/28/2020 at Unknown time  . B Complex Vitamins (B COMPLEX PO) Take 1 tablet by mouth daily.   09/28/2020 at Unknown time  . Biotin 10 MG TABS Take 10 mg by mouth daily. 10 mg - 10,000 mcg   09/28/2020 at Unknown time  . budesonide-formoterol (SYMBICORT) 160-4.5 MCG/ACT inhaler Inhale 2 puffs into the lungs 2  (two) times daily. 30.6 g 3 09/28/2020 at Unknown time  . CALCIUM PO Take 1 tablet by mouth 2 (two) times daily.   09/28/2020 at Unknown time  . carvedilol (COREG) 6.25 MG tablet Take 6.25 mg by mouth 2 (two) times daily.   09/28/2020 at 9 am  . cholecalciferol (VITAMIN D3) 25 MCG (1000 UNIT) tablet Take 1,000 Units by mouth 2 (two) times daily.   09/28/2020 at Unknown time  . CHROMIUM PO Take 1 tablet by mouth 2 (two) times daily.   09/28/2020 at Unknown time  . dapagliflozin propanediol (FARXIGA) 10 MG TABS tablet Take 1 tablet (10 mg total) by mouth daily before breakfast. 28 tablet 0 09/28/2020 at Unknown time  . dofetilide (TIKOSYN) 125 MCG capsule Take 125 mcg by mouth every 12 (twelve) hours.   09/28/2020 at Unknown time  . ELIQUIS 5 MG TABS tablet Take 5 mg by mouth 2 (two) times daily.   09/28/2020 at 9 am  . escitalopram (LEXAPRO) 10 MG tablet TAKE 1 TABLET(10 MG) BY MOUTH DAILY (Patient taking differently: Take 10 mg by mouth daily.) 90 tablet 0 09/28/2020 at Unknown time  . levOCARNitine (L-CARNITINE) 500 MG TABS Take 500 mg by mouth daily.   09/28/2020 at Unknown time  . Magnesium 400 MG TABS Take 400 mg by mouth daily. (Patient taking differently: Take 400 mg by mouth 2 (two) times daily.) 60 tablet 3 09/28/2020 at Unknown time  . metFORMIN (GLUCOPHAGE) 500 MG tablet Take 1-3 tablets (500-1,500 mg total) by mouth See admin instructions. Take one tablet (500 mg) by mouth daily with breakfast and three tablets (1500 mg) daily with supper   09/28/2020 at Unknown time  . Potassium Chloride ER 20 MEQ TBCR Take 1 tablet by mouth 2 (two) times daily. (Patient taking differently: Take 20 mEq by mouth 2 (two) times daily.) 60 tablet 11 09/28/2020 at Unknown time  . spironolactone (ALDACTONE) 25 MG tablet Take 25 mg by mouth daily.   09/28/2020 at Unknown time  . torsemide (DEMADEX) 100 MG tablet Take 1 tablet (100 mg total) by mouth 2 (two) times daily. 180 tablet 3 09/28/2020 at Unknown time  . vitamin E 180  MG (400 UNITS) capsule Take 400 Units by mouth daily.   09/27/2020 at Unknown time  . Zinc 50 MG TABS Take 50 mg by mouth daily.   09/28/2020 at Unknown time  . Zn-Pyg Afri-Nettle-Saw Palmet (SAW PALMETTO COMPLEX PO) Take 540 mg by mouth daily.   09/28/2020 at Unknown time  . acetaminophen (TYLENOL) 325 MG tablet Take 2 tablets (650 mg total) by mouth every 4 (four) hours as needed for headache or mild pain. (Patient not taking: No sig reported)   Not Taking at Unknown time  . ONETOUCH ULTRA test strip USE TO TEST BLOOD SUGAR 2 TIMES  DAILY       Inpatient Medications:  . apixaban  5 mg Oral BID  . atorvastatin  20 mg Oral Daily  . Chlorhexidine Gluconate Cloth  6 each Topical Daily  . dapagliflozin propanediol  10 mg Oral Daily  . digoxin  0.125 mg Oral Daily  . docusate sodium  100 mg Oral BID  . escitalopram  10 mg Oral Daily  . insulin aspart  0-9 Units Subcutaneous TID WC  . losartan  12.5 mg Oral Daily  . mometasone-formoterol  2 puff Inhalation BID  . polyethylene glycol  17 g Oral BID  . potassium chloride  40 mEq Oral Q4H  . sodium chloride flush  10-40 mL Intracatheter Q12H  . sodium chloride flush  3 mL Intravenous Q12H  . spironolactone  25 mg Oral Daily    Allergies:  Allergies  Allergen Reactions  . Semaglutide Other (See Comments)    Nausea ,vomiting ,back pain    Social History   Socioeconomic History  . Marital status: Widowed    Spouse name: Not on file  . Number of children: Not on file  . Years of education: Not on file  . Highest education level: Not on file  Occupational History  . Not on file  Tobacco Use  . Smoking status: Former Games developer  . Smokeless tobacco: Never Used  Vaping Use  . Vaping Use: Never used  Substance and Sexual Activity  . Alcohol use: Not Currently  . Drug use: Not Currently  . Sexual activity: Not on file  Other Topics Concern  . Not on file  Social History Narrative  . Not on file   Social Determinants of Health    Financial Resource Strain: Medium Risk  . Difficulty of Paying Living Expenses: Somewhat hard  Food Insecurity: No Food Insecurity  . Worried About Programme researcher, broadcasting/film/video in the Last Year: Never true  . Ran Out of Food in the Last Year: Never true  Transportation Needs: No Transportation Needs  . Lack of Transportation (Medical): No  . Lack of Transportation (Non-Medical): No  Physical Activity: Inactive  . Days of Exercise per Week: 0 days  . Minutes of Exercise per Session: 0 min  Stress: No Stress Concern Present  . Feeling of Stress : Not at all  Social Connections: Moderately Isolated  . Frequency of Communication with Friends and Family: Once a week  . Frequency of Social Gatherings with Friends and Family: More than three times a week  . Attends Religious Services: Never  . Active Member of Clubs or Organizations: No  . Attends Banker Meetings: Never  . Marital Status: Living with partner  Intimate Partner Violence: Not At Risk  . Fear of Current or Ex-Partner: No  . Emotionally Abused: No  . Physically Abused: No  . Sexually Abused: No     Family History  Problem Relation Age of Onset  . Heart attack Mother   . High Cholesterol Mother   . Asthma Father   . Kidney disease Sister   . Stroke Maternal Grandfather   . Heart attack Paternal Grandfather      Review of Systems: All other systems reviewed and are otherwise negative except as noted above.  Physical Exam: Vitals:   10/01/20 0849 10/01/20 0936 10/01/20 1355 10/01/20 1414  BP:    115/89  Pulse:    82  Resp:    17  Temp:    98.2 F (36.8 C)  TempSrc:  Oral  SpO2: 99% 96% 95% 95%  Weight:      Height:        GEN- The patient is well appearing, alert and oriented x 3 today.   HEENT: normocephalic, atraumatic; sclera clear, conjunctiva pink; hearing intact; oropharynx clear; neck supple Lungs- Clear to ausculation bilaterally, normal work of breathing.  No wheezes, rales,  rhonchi Heart- Regular rate and rhythm, no murmurs, rubs or gallops GI- soft, non-tender, non-distended, bowel sounds present Extremities- no clubbing, cyanosis, or edema; DP/PT/radial pulses 2+ bilaterally MS- no significant deformity or atrophy Skin- warm and dry, no rash or lesion Psych- euthymic mood, full affect Neuro- strength and sensation are intact  Labs:   Lab Results  Component Value Date   WBC 9.3 10/01/2020   HGB 14.2 10/01/2020   HCT 43.1 10/01/2020   MCV 88.5 10/01/2020   PLT 231 10/01/2020    Recent Labs  Lab 09/28/20 1612 09/29/20 0040 10/01/20 0450  NA 133*   < > 136  K 3.7   < > 3.3*  CL 98   < > 96*  CO2 26   < > 31  BUN 20   < > 14  CREATININE 1.03   < > 0.98  CALCIUM 8.9   < > 9.1  PROT 6.9  --   --   BILITOT 1.5*  --   --   ALKPHOS 77  --   --   ALT 25  --   --   AST 33  --   --   GLUCOSE 131*   < > 145*   < > = values in this interval not displayed.      Radiology/Studies: [facility] Chest Port 1 View  Result Date: 09/28/2020 CLINICAL DATA:  Worsening shortness of breath. EXAM: PORTABLE CHEST 1 VIEW COMPARISON:  None. FINDINGS: Left chest wall pacemaker with single lead terminating in the right ventricle. Moderate cardiomegaly. Pulmonary vascular congestion with mild diffuse interstitial thickening. No focal consolidation, pleural effusion, or pneumothorax. No acute osseous abnormality. IMPRESSION: 1. Cardiomegaly with mild interstitial pulmonary edema. Electronically Signed   By: Obie Dredge M.D.   On: 09/28/2020 16:51   ECHOCARDIOGRAM COMPLETE  Result Date: 09/28/2020    ECHOCARDIOGRAM REPORT   Patient Name:   Chad Wilkerson Date of Exam: 09/28/2020 Medical Rec #:  [id number]         Height:       68.0 in Accession #:    [id number]        Weight:       231.0 lb Date of Birth:  01/04/1946          BSA:          2.173 m Patient Age:    74 years          BP:           98/67 mmHg Patient Gender: M                 HR:           72 bpm. Exam Location:   [address] Procedure: 2D Echo, Cardiac Doppler and Color Doppler Indications:    I50.22 CHF  History:        Patient has prior history of Echocardiogram examinations, most                 recent 04/23/2020. Risk Factors:Sleep Apnea, Diabetes and  Hypertension.  Sonographer:    Clearence Ped RCS Referring Phys: [id number] MAHESH A CHANDRASEKHAR IMPRESSIONS  1. Left ventricular ejection fraction, by estimation, is <20%. The left ventricle has severely decreased function. The left ventricle demonstrates global hypokinesis. The left ventricular internal cavity size was severely dilated. Left ventricular diastolic parameters are consistent with Grade III diastolic dysfunction (restrictive).  2. Right ventricular systolic function is moderately reduced. The right ventricular size is mildly enlarged. There is normal pulmonary artery systolic pressure.  3. Left atrial size was severely dilated.  4. Right atrial size was severely dilated.  5. The mitral valve is grossly normal. Mild to moderate mitral valve regurgitation.  6. Tricuspid valve regurgitation is moderate to severe.  7. The aortic valve is grossly normal. Aortic valve regurgitation is not visualized.  8. Aortic dilatation noted. There is mild dilatation of the ascending aorta, measuring 41 mm. FINDINGS  Left Ventricle: Left ventricular ejection fraction, by estimation, is <20%. The left ventricle has severely decreased function. The left ventricle demonstrates global hypokinesis. The left ventricular internal cavity size was severely dilated. There is no left ventricular hypertrophy. Left ventricular diastolic parameters are consistent with Grade III diastolic dysfunction (restrictive). Right Ventricle: The right ventricular size is mildly enlarged. Right vetricular wall thickness was not well visualized. Right ventricular systolic function is moderately reduced. There is normal pulmonary artery systolic pressure. The tricuspid regurgitant velocity  is 1.82 m/s, and with an assumed right atrial pressure of 3 mmHg, the estimated right ventricular systolic pressure is 16.2 mmHg. Left Atrium: Left atrial size was severely dilated. Right Atrium: Right atrial size was severely dilated. Pericardium: There is no evidence of pericardial effusion. Mitral Valve: The mitral valve is grossly normal. There is mild thickening of the mitral valve leaflet(s). There is mild calcification of the mitral valve leaflet(s). Mild mitral annular calcification. Mild to moderate mitral valve regurgitation. Tricuspid Valve: The tricuspid valve is grossly normal. Tricuspid valve regurgitation is moderate to severe. Aortic Valve: The aortic valve is grossly normal. Aortic valve regurgitation is not visualized. Aortic regurgitation PHT measures 530 msec. Aortic valve mean gradient measures 9.0 mmHg. Aortic valve peak gradient measures 17.5 mmHg. Aortic valve area, by  VTI measures 0.84 cm. Pulmonic Valve: The pulmonic valve was normal in structure. Pulmonic valve regurgitation is trivial. Aorta: Aortic dilatation noted. There is mild dilatation of the ascending aorta, measuring 41 mm. IAS/Shunts: The atrial septum is grossly normal. Additional Comments: A device lead is visualized.  LEFT VENTRICLE PLAX 2D LVIDd:         6.90 cm  Diastology LVIDs:         6.20 cm  LV e' medial:    4.90 cm/s LV PW:         1.00 cm  LV E/e' medial:  20.3 LV IVS:        0.80 cm  LV e' lateral:   8.27 cm/s LVOT diam:     2.20 cm  LV E/e' lateral: 12.0 LV SV:         35 LV SV Index:   16 LVOT Area:     3.80 cm  RIGHT VENTRICLE RV Basal diam:  5.50 cm RV S prime:     5.00 cm/s TAPSE (M-mode): 1.9 cm RVSP:           16.2 mmHg LEFT ATRIUM              Index       RIGHT ATRIUM  Index LA diam:        5.70 cm  2.62 cm/m  RA Pressure: 3.00 mmHg LA Vol (A2C):   152.0 ml 69.96 ml/m RA Area:     28.30 cm LA Vol (A4C):   70.6 ml  32.49 ml/m RA Volume:   96.70 ml  44.50 ml/m LA Biplane Vol: 105.0 ml 48.32  ml/m  AORTIC VALVE AV Area (Vmax):    0.84 cm AV Area (Vmean):   0.85 cm AV Area (VTI):     0.84 cm AV Vmax:           209.00 cm/s AV Vmean:          142.000 cm/s AV VTI:            0.417 m AV Peak Grad:      17.5 mmHg AV Mean Grad:      9.0 mmHg LVOT Vmax:         46.30 cm/s LVOT Vmean:        31.700 cm/s LVOT VTI:          0.092 m LVOT/AV VTI ratio: 0.22 AI PHT:            530 msec  AORTA Ao Root diam: 3.20 cm Ao Asc diam:  4.10 cm MITRAL VALVE                 TRICUSPID VALVE MV Area (PHT):               TR Peak grad:   13.2 mmHg MV Decel Time:               TR Vmax:        182.00 cm/s MR Peak grad:    55.7 mmHg   Estimated RAP:  3.00 mmHg MR Mean grad:    35.0 mmHg   RVSP:           16.2 mmHg MR Vmax:         373.00 cm/s MR Vmean:        273.0 cm/s  SHUNTS MR PISA:         4.02 cm    Systemic VTI:  0.09 m MR PISA Eff ROA: 38 mm      Systemic Diam: 2.20 cm MR PISA Radius:  0.80 cm MV E velocity: 99.40 cm/s MV A velocity: 43.30 cm/s MV E/A ratio:  2.30 Kristeen Miss MD Electronically signed by Kristeen Miss MD Signature Date/Time: 09/28/2020/3:45:27 PM    Final    [place] EKG SITE RITE  Result Date: 09/29/2020 If Site Rite image not attached, placement could not be confirmed due to current cardiac rhythm.   EKG: On arrival showed NSR at 82 bpm, QTc 448 ms EKG 09/30/20 at 90 bpm, QTc ~440 ms  EKG 09/29/20 with QTc closer to 500 (personally reviewed)  TELEMETRY: NSR 80-100s, VF episode from this afternoon reviewed. Rates in 230s. It is difficult to assess the trigger due to the irregularity from his ectopy.  (personally reviewed)  DEVICE HISTORY: Medtronic single chamber ICD 11/2016 outside facility for CHF  Assessment/Plan: 1.  Ventricular fibrillation K 3.3 this am; Supp on-going QTc had been borderline in the past, and with VF, Tikosyn is a poor option for him.  Had discussed starting amiodarone, at least short term, and he initially refused. He verbalizes understanding that this may be the best  chance we have right now to "calm down" his heart in the setting of his acute illness. He is  willing to start on amiodarone, but remains apprehensive of oral dosing.  Keep K > 4.0 and Mg > 2.0  Reviewed NCDMV guidelines of no driving x 6 month.   2. Paroxysmal atrial fibrillation Previously managed on Tikosyn and Eliquis  3. Acute on chronic systolic CHF due to NICM Echo 5/42/7062 LVEF <20% Currently requiring milrinone with low cardiac output and to augment diuresis  4. Goals of Care He wants a quality of life where he can do his ADLs, go to the grocery store, and go for walks. He says if he can't do this, or at anytime we think he won't get to that point just "show him where to sign".  It would be reasonable to involve palliative care, but perhaps next week when patient has had a little more time to process the events and conversations of today. I believe in his current mindset he would attribute this to a poorer short term outcome, which may affect conversations around his care.   For questions or updates, please contact [facility] Please consult [email or web address] for contact info under Cardiology/STEMI.  Signed, Graciella Freer, PA-C  10/01/2020 2:55 PM   I have seen and examined this patient with Rosana Fret.  Agree with above, note added to reflect my findings.  On exam, irregular, no murmurs.  Patient with a history of nonischemic cardiomyopathy, A. fib on Eliquis and Tikosyn, diabetes, hypertension.  He presented to the hospital with a heart failure exacerbation.  He is currently on milrinone.  He had an episode of what appears to be ventricular fibrillation this afternoon.  He received a shock from his defibrillator.  He currently feels well and is without complaints.  He actually feels better after his ICD shock.  Due to his ICD shocks, and need for milrinone, I feel that it is likely that amiodarone is the best medication for him to both prevent atrial fibrillation and  ventricular tachycardia/fibrillation.  The patient has agreed to this.  He previously said that he would not take the medication.    Narek Kniss M. Keyasia Jolliff MD 10/01/2020 4:22 PM

## 2020-10-01 NOTE — Evaluation (Signed)
Physical Therapy Evaluation Patient Details Name: Chad Wilkerson MRN: [id number] DOB: 1945-08-18 Today's Date: 10/01/2020   History of Present Illness  Pt is a 75 y.o. male admitted 09/28/20 with SOB and weight gain. 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 TTE 4/19 showed EF <20%. PMH includes CHF, NICM s/p ICD, afib on Eliquis and Tikosyn, DM2, HTN, OSA on BiPAP.    Clinical Impression  Pt presents with an overall decrease in functional mobility secondary to above. PTA, pt reports mod indep ambulating with intermittent use of SPC; lives with significant other who assists with ADLs and household tasks as needed. Today, pt mod indep with bed mobility, demonstrates good seated balance with DOE 2-3/4 during conversation and activity. Session cut short by MD present for evaluation; will continue to assess while admitted. Pt would benefit from continued acute PT services to maximize functional mobility and independence prior to d/c with HHPT services.   Pt removed CPAP at beginning of session; SpO2 maintaining 90% on RA with bed-level and seated activity    Follow Up Recommendations Home health PT;Supervision for mobility/OOB    Equipment Recommendations  None recommended by PT    Recommendations for Other Services       Precautions / Restrictions Precautions Precautions: Fall;Other (comment) Precaution Comments: Watch SpO2 - does not wear O2 baseline (except CPAP overnight) Restrictions Weight Bearing Restrictions: No      Mobility  Bed Mobility Overal bed mobility: Modified Independent             General bed mobility comments: Reliant on momentum to power into sitting from elevated HOB, UE support pulling on side of mattress    Transfers                 General transfer comment: Deferred secondary to HF MD wanting pt to return to supine to check CVP, multiple other providers waiting to see pt after this  Ambulation/Gait                Stairs             Wheelchair Mobility    Modified Rankin (Stroke Patients Only)       Balance Overall balance assessment: Needs assistance   Sitting balance-Leahy Scale: Good Sitting balance - Comments: Prolonged sitting EOB without assist                                     Pertinent Vitals/Pain Pain Assessment: Faces Faces Pain Scale: Hurts a little bit Pain Location: Chest "muscle pull" Pain Descriptors / Indicators: Sore Pain Intervention(s): Monitored during session    Home Living Family/patient expects to be discharged to:: Private residence Living Arrangements: Spouse/significant other (girlfriend/caregiver) Available Help at Discharge: Friend(s);Available 24 hours/day Type of Home: House Home Access: Level entry     Home Layout: Two level;Bed/bath upstairs Home Equipment: Gilmer Mor - single point Additional Comments: Lives with significant other who also acts as caregiver. Pt plans to move to single story home with children for easier accessibility    Prior Function Level of Independence: Needs assistance   Gait / Transfers Assistance Needed: Mod indep ambulator with intermittent use of SPC  ADL's / Homemaking Assistance Needed: Significant other assists with ADLs as needed; she perform majority of household tasks  Comments: Wears CPAP (does not wear O2 during day)     Hand Dominance        Extremity/Trunk Assessment  Upper Extremity Assessment Upper Extremity Assessment: Generalized weakness    Lower Extremity Assessment Lower Extremity Assessment: Generalized weakness       Communication   Communication: HOH  Cognition Arousal/Alertness: Awake/alert Behavior During Therapy: WFL for tasks assessed/performed Overall Cognitive Status: Within Functional Limits for tasks assessed                                 General Comments: WFL for simple tasks, not formally assessed; requires repetition secondary to [facility]      General  Comments General comments (skin integrity, edema, etc.): Pt removed CPAP at beginning of session, SpO2 90% on RA sitting EOB, DOE 2-3/4 with bed mobility; pt reports difficulty breathing laying flat    Exercises     Assessment/Plan    PT Assessment Patient needs continued PT services  PT Problem List Decreased strength;Decreased activity tolerance;Decreased balance;Decreased mobility;Cardiopulmonary status limiting activity       PT Treatment Interventions DME instruction;Gait training;Stair training;Functional mobility training;Therapeutic activities;Therapeutic exercise;Balance training;Patient/family education    PT Goals (Current goals can be found in the Care Plan section)  Acute Rehab PT Goals Patient Stated Goal: Get some rest, return home PT Goal Formulation: With patient Time For Goal Achievement: 10/15/20 Potential to Achieve Goals: Good    Frequency Min 3X/week   Barriers to discharge        Co-evaluation               AM-PAC PT "6 Clicks" Mobility  Outcome Measure Help needed turning from your back to your side while in a flat bed without using bedrails?: None Help needed moving from lying on your back to sitting on the side of a flat bed without using bedrails?: A Little Help needed moving to and from a bed to a chair (including a wheelchair)?: A Little Help needed standing up from a chair using your arms (e.g., wheelchair or bedside chair)?: A Little Help needed to walk in hospital room?: A Little Help needed climbing 3-5 steps with a railing? : A Little 6 Click Score: 19    End of Session   Activity Tolerance: Patient tolerated treatment well;Patient limited by fatigue Patient left: in bed;with call bell/phone within reach;Other (comment);with nursing/sitter in room (with MD present) Nurse Communication: Mobility status PT Visit Diagnosis: Other abnormalities of gait and mobility (R26.89)    Time: 2423-5361 PT Time Calculation (min) (ACUTE ONLY):  14 min   Charges:   PT Evaluation $PT Eval Moderate Complexity: 1 Mod     Ina Homes, PT, DPT Acute Rehabilitation Services  Pager [phone] Office [phone]  Malachy Chamber 10/01/2020, 10:03 AM

## 2020-10-01 NOTE — Progress Notes (Signed)
PROGRESS NOTE    Chad Wilkerson  PQA:[id number] DOB: 10-Aug-1945 DOA: 09/28/2020 PCP: [name], NP    Chief Complaint  Patient presents with  . Shortness of Breath   Brief Narrative:  Chad Wilkerson is Chad Wilkerson 75 y.o. male with medical history significant for chronic combined systolic and diastolic CHF (EF less than 20%, G3 DD by TTE 09/28/2020), NICM, s/p ICD, atrial fibrillation on Eliquis and Tikosyn, T2DM, HTN, and OSA on BiPAP who presented to the ED for evaluation of shortness of breath and weightgain.  Patient reports 1 week of progressive dyspnea, DOE, orthopnea weight gain.  He was seen in follow-up in his cardiology office earlier today.  He underwent echocardiogram which showed EF <20%.  He had significant shortness of breath therefore was sent to the ED for further management.  He's been admitted with HF exacerbation.  Assessment & Plan:   Principal Problem:   Acute on chronic combined systolic (congestive) and diastolic (congestive) heart failure (HCC) Active Problems:   Sleep apnea   Paroxysmal atrial fibrillation (HCC)   ICD (implantable cardioverter-defibrillator) in place  Chad Wilkerson is Chad Wilkerson 75 y.o. male with medical history significant for chronic combined systolic and diastolic CHF (EF less than 20%, G3 DD by TTE 09/28/2020), NICM, s/p ICD, atrial fibrillation on Eliquis and Tikosyn, T2DM, HTN, and OSA on BiPAP who is admitted with acute on chronic combined systolic and diastolic CHF.  Ventricular Fibrillation  - run of vfib this PM s/p shock from ICD - follow bmet - amiodarone per cards.  Continue milrinone for now.  Acute on chronic combined systolic and diastolic CHF s/p ICD: TTE 4/19 prior to admit showed EF <20% with G3 DD. Also moderately reduced RVSF.   Cardiology following and managing diuresis.  At risk for low output heart failure and therefore started on milrinone. -Cardiology following - appreciate assistance -> milrinone, lasix gtt,  farxiga, spironolactone increased to 25 mg daily, losartan 12.5 mg daily - adding digoxin - metolazone per cards - possibly repeat RHC after diuresed, possible LVAD candidate? -Monitor strict I/O's and daily weights  Hypokalemia Replace and follow  Hypotension: Holding home Coreg.  On milrinone as above.  IV lasix.  Spironolactone.  Losartan  Paroxysmal atrial fibrillation: Continue Eliquis and Tikosyn.  Holding Coreg.  Type 2 diabetes: Hold home metformin.  Continue farxiga.  SSI.   OSA: Continue BiPAP nightly.  DVT prophylaxis: eliquis Code Status: DNR Family Communication: none at bedside Disposition:   Status is: Inpatient  Remains inpatient appropriate because:Inpatient level of care appropriate due to severity of illness   Dispo: The patient is from: home              Anticipated d/c is to: pending              Patient currently is not medically stable to d/c.   Difficult to place patient No       Consultants:   cardiology  Procedures: Echo IMPRESSIONS    1. Left ventricular ejection fraction, by estimation, is <20%. The left  ventricle has severely decreased function. The left ventricle demonstrates  global hypokinesis. The left ventricular internal cavity size was severely  dilated. Left ventricular  diastolic parameters are consistent with Grade III diastolic dysfunction  (restrictive).  2. Right ventricular systolic function is moderately reduced. The right  ventricular size is mildly enlarged. There is normal pulmonary artery  systolic pressure.  3. Left atrial size was severely dilated.  4. Right atrial size  was severely dilated.  5. The mitral valve is grossly normal. Mild to moderate mitral valve  regurgitation.  6. Tricuspid valve regurgitation is moderate to severe.  7. The aortic valve is grossly normal. Aortic valve regurgitation is not  visualized.  8. Aortic dilatation noted. There is mild dilatation of the ascending   aorta, measuring 41 mm.  Antimicrobials: Anti-infectives (From admission, onward)   None         Subjective: Seen after v fib No complaints, notes lightheadedness after albuterol   Objective: Vitals:   10/01/20 0849 10/01/20 0936 10/01/20 1355 10/01/20 1414  BP:    115/89  Pulse:    82  Resp:    17  Temp:    98.2 F (36.8 C)  TempSrc:    Oral  SpO2: 99% 96% 95% 95%  Weight:      Height:        Intake/Output Summary (Last 24 hours) at 10/01/2020 1433 Last data filed at 10/01/2020 1349 Gross per 24 hour  Intake 1260 ml  Output 6025 ml  Net -4765 ml   Filed Weights   09/29/20 0005 09/30/20 0347 10/01/20 0456  Weight: 108.4 kg 107.5 kg 103 kg    Examination:  General: No acute distress. Cardiovascular: Heart sounds show Chad Wilkerson regular rate, and rhythm Lungs: diminished, bibasilar crackles Abdomen: Soft, nontender, nondistended Neurological: Alert and oriented 3. Moves all extremities 4. Cranial nerves II through XII grossly intact. Skin: Warm and dry. No rashes or lesions. Extremities: No clubbing or cyanosis. No edema.     Data Reviewed: I have personally reviewed following labs and imaging studies  CBC: Recent Labs  Lab 09/28/20 1612 09/29/20 0040 09/29/20 1419 09/30/20 0429 10/01/20 0450  WBC 8.9 9.7 8.2 9.0 9.3  NEUTROABS 6.8  --   --   --   --   HGB 15.0 14.2 13.2 13.9 14.2  HCT 45.8 43.3 40.1 41.7 43.1  MCV 89.3 88.4 88.7 87.8 88.5  PLT 257 234 200 213 231    Basic Metabolic Panel: Recent Labs  Lab 09/28/20 1612 09/29/20 0040 09/29/20 1419 09/30/20 0429 09/30/20 1730 10/01/20 0450  NA 133* 135  --  137 135 136  K 3.7 3.3*  --  2.8* 3.2* 3.3*  CL 98 97*  --  100 95* 96*  CO2 26 26  --  26 30 31   GLUCOSE 131* 191*  --  136* 194* 145*  BUN 20 19  --  12 12 14   CREATININE 1.03 1.09  --  0.88 0.95 0.98  CALCIUM 8.9 8.7*  --  8.4* 8.8* 9.1  MG  --  2.1 2.0 2.0  --  2.2    GFR: Estimated Creatinine Clearance: 77.6 mL/min (by C-G  formula based on SCr of 0.98 mg/dL).  Liver Function Tests: Recent Labs  Lab 09/28/20 1612  AST 33  ALT 25  ALKPHOS 77  BILITOT 1.5*  PROT 6.9  ALBUMIN 3.6    CBG: Recent Labs  Lab 09/30/20 1542 09/30/20 2352 10/01/20 0626 10/01/20 1212 10/01/20 1407  GLUCAP 116* 182* 164* 174* 254*     Recent Results (from the past 240 hour(s))  Resp Panel by RT-PCR (Flu Evamae Rowen&B, Covid) Nasopharyngeal Swab     Status: None   Collection Time: 09/28/20  4:13 PM   Specimen: Nasopharyngeal Swab; Nasopharyngeal(NP) swabs in vial transport medium  Result Value Ref Range Status   SARS Coronavirus 2 by RT PCR NEGATIVE NEGATIVE Final    Comment: (NOTE) SARS-CoV-2 target  nucleic acids are NOT DETECTED.  The SARS-CoV-2 RNA is generally detectable in upper respiratory specimens during the acute phase of infection. The lowest concentration of SARS-CoV-2 viral copies this assay can detect is 138 copies/mL. Hughes Wyndham negative result does not preclude SARS-Cov-2 infection and should not be used as the sole basis for treatment or other patient management decisions. Kedar Sedano negative result may occur with  improper specimen collection/handling, submission of specimen other than nasopharyngeal swab, presence of viral mutation(s) within the areas targeted by this assay, and inadequate number of viral copies(<138 copies/mL). Jaishaun Mcnab negative result must be combined with clinical observations, patient history, and epidemiological information. The expected result is Negative.  Fact Sheet for Patients:  BloggerCourse.com  Fact Sheet for Healthcare Providers:  SeriousBroker.it  This test is no t yet approved or cleared by the [place] FDA and  has been authorized for detection and/or diagnosis of SARS-CoV-2 by FDA under an Emergency Use Authorization (EUA). This EUA will remain  in effect (meaning this test can be used) for the duration of the COVID-19 declaration under  Section 564(b)(1) of the Act, 21 U.S.C.section 360bbb-3(b)(1), unless the authorization is terminated  or revoked sooner.       Influenza Sanvi Ehler by PCR NEGATIVE NEGATIVE Final   Influenza B by PCR NEGATIVE NEGATIVE Final    Comment: (NOTE) The Xpert Xpress SARS-CoV-2/FLU/RSV plus assay is intended as an aid in the diagnosis of influenza from Nasopharyngeal swab specimens and should not be used as Adyn Serna sole basis for treatment. Nasal washings and aspirates are unacceptable for Xpert Xpress SARS-CoV-2/FLU/RSV testing.  Fact Sheet for Patients: BloggerCourse.com  Fact Sheet for Healthcare Providers: SeriousBroker.it  This test is not yet approved or cleared by the [place] FDA and has been authorized for detection and/or diagnosis of SARS-CoV-2 by FDA under an Emergency Use Authorization (EUA). This EUA will remain in effect (meaning this test can be used) for the duration of the COVID-19 declaration under Section 564(b)(1) of the Act, 21 U.S.C. section 360bbb-3(b)(1), unless the authorization is terminated or revoked.  Performed at [facility], [address] [place], [place] [address]          Radiology Studies: No results found.      Scheduled Meds: . amiodarone  150 mg Intravenous Once  . apixaban  5 mg Oral BID  . atorvastatin  20 mg Oral Daily  . Chlorhexidine Gluconate Cloth  6 each Topical Daily  . dapagliflozin propanediol  10 mg Oral Daily  . digoxin  0.125 mg Oral Daily  . docusate sodium  100 mg Oral BID  . dofetilide  125 mcg Oral Q12H  . escitalopram  10 mg Oral Daily  . insulin aspart  0-9 Units Subcutaneous TID WC  . losartan  12.5 mg Oral Daily  . mometasone-formoterol  2 puff Inhalation BID  . polyethylene glycol  17 g Oral BID  . potassium chloride  40 mEq Oral Q4H  . sodium chloride flush  10-40 mL Intracatheter Q12H  . sodium chloride flush  3 mL Intravenous Q12H  . spironolactone  25  mg Oral Daily   Continuous Infusions: . sodium chloride    . amiodarone     Followed by  . amiodarone    . furosemide (LASIX) 200 mg in dextrose 5% 100 mL (2mg /mL) infusion 12 mg/hr (09/30/20 2201)  . milrinone 0.375 mcg/kg/min (10/01/20 1217)     LOS: 3 days    Time spent: over 30 min  Lacretia Nicks, MD [facility]   To contact the attending provider between 7A-7P or the covering provider during after hours 7P-7A, please log into the web site [email or web address] and access using universal [place] password for that web site. If you do not have the password, please call the hospital operator.  10/01/2020, 2:33 PM

## 2020-10-01 NOTE — Progress Notes (Signed)
Patient ID: Chad Wilkerson, male   DOB: [birth year], 75 y.o.   MRN: [id number]     Advanced Heart Failure Rounding Note  PCP-Cardiologist: Christell Constant, MD   Subjective:    Excellent diuresis today after getting metolazone 2.5 x 1.  K low at 3.3, creatinine stable.  SBP 110s.  Co-ox 58% on milrinone 0.375.  He remains on Lasix gtt 12 mg/hr.  CVP 17-18 today.    He is in NSR with occasional PVCs.    Objective:   Weight Range: 103 kg Body mass index is 34.01 kg/m.   Vital Signs:   Temp:  [97.8 F (36.6 C)-99.2 F (37.3 C)] 98.6 F (37 C) (04/22 0842) Pulse Rate:  [60-100] 84 (04/22 0842) Resp:  [20] 20 (04/22 0842) BP: (91-116)/(64-87) 99/64 (04/22 0842) SpO2:  [90 %-99 %] 91 % (04/22 0842) Weight:  [103 kg] 103 kg (04/22 0456) Last BM Date: 09/27/20  Weight change: Filed Weights   09/29/20 0005 09/30/20 0347 10/01/20 0456  Weight: 108.4 kg 107.5 kg 103 kg    Intake/Output:   Intake/Output Summary (Last 24 hours) at 10/01/2020 0849 Last data filed at 10/01/2020 0827 Gross per 24 hour  Intake 840 ml  Output 7350 ml  Net -6510 ml      Physical Exam    General: NAD Neck: JVP 14+ cm, no thyromegaly or thyroid nodule.  Lungs: Clear to auscultation bilaterally with normal respiratory effort. CV: Lateral PMI.  Heart regular S1/S2, no S3/S4, no murmur.  No peripheral edema.   Abdomen: Soft, nontender, no hepatosplenomegaly, mild distention.  Skin: Intact without lesions or rashes.  Neurologic: Alert and oriented x 3.  Psych: Normal affect. Extremities: No clubbing or cyanosis.  HEENT: Normal.    Telemetry   NSR with 1st degree AVB, occasional PVCs (personally reviewed)   Labs    CBC Recent Labs    09/28/20 1612 09/29/20 0040 09/30/20 0429 10/01/20 0450  WBC 8.9   < > 9.0 9.3  NEUTROABS 6.8  --   --   --   HGB 15.0   < > 13.9 14.2  HCT 45.8   < > 41.7 43.1  MCV 89.3   < > 87.8 88.5  PLT 257   < > 213 231   < > = values in this interval  not displayed.   Basic Metabolic Panel Recent Labs    83/15/17 0429 09/30/20 1730 10/01/20 0450  NA 137 135 136  K 2.8* 3.2* 3.3*  CL 100 95* 96*  CO2 26 30 31   GLUCOSE 136* 194* 145*  BUN 12 12 14   CREATININE 0.88 0.95 0.98  CALCIUM 8.4* 8.8* 9.1  MG 2.0  --  2.2   Liver Function Tests Recent Labs    09/28/20 1612  AST 33  ALT 25  ALKPHOS 77  BILITOT 1.5*  PROT 6.9  ALBUMIN 3.6   Recent Labs    09/28/20 1612  LIPASE 59*   Cardiac Enzymes No results for input(s): CKTOTAL, CKMB, CKMBINDEX, TROPONINI in the last 72 hours.  BNP: BNP (last 3 results) Recent Labs    09/28/20 1613  BNP 3,328.4*    ProBNP (last 3 results) Recent Labs    04/01/20 1509  PROBNP 5,001*     D-Dimer No results for input(s): DDIMER in the last 72 hours. Hemoglobin A1C Recent Labs    09/29/20 0040  HGBA1C 6.6*   Fasting Lipid Panel No results for input(s): CHOL, HDL, LDLCALC, TRIG, CHOLHDL, LDLDIRECT in  the last 72 hours. Thyroid Function Tests No results for input(s): TSH, T4TOTAL, T3FREE, THYROIDAB in the last 72 hours.  Invalid input(s): FREET3  Other results:   Imaging    No results found.   Medications:     Scheduled Medications: . apixaban  5 mg Oral BID  . atorvastatin  20 mg Oral Daily  . Chlorhexidine Gluconate Cloth  6 each Topical Daily  . dapagliflozin propanediol  10 mg Oral Daily  . digoxin  0.125 mg Oral Daily  . docusate sodium  100 mg Oral BID  . dofetilide  125 mcg Oral Q12H  . escitalopram  10 mg Oral Daily  . insulin aspart  0-9 Units Subcutaneous TID WC  . losartan  12.5 mg Oral Daily  . metolazone  2.5 mg Oral Once  . mometasone-formoterol  2 puff Inhalation BID  . polyethylene glycol  17 g Oral BID  . potassium chloride  40 mEq Oral Q4H  . sodium chloride flush  10-40 mL Intracatheter Q12H  . sodium chloride flush  3 mL Intravenous Q12H  . spironolactone  25 mg Oral Daily    Infusions: . sodium chloride    . furosemide  (LASIX) 200 mg in dextrose 5% 100 mL (2mg /mL) infusion 12 mg/hr (09/30/20 2201)  . milrinone 0.375 mcg/kg/min (10/01/20 0123)    PRN Medications: acetaminophen **OR** acetaminophen, ondansetron **OR** ondansetron (ZOFRAN) IV, polyethylene glycol, sodium chloride flush   Assessment/Plan   1. Acute on chronic systolic CHF: Long-standing NICM, followed in the past in [place].  Has MDT ICD.  Echo 09/28/20 with EF <20%, severe LV dilation, mild RV dilation with moderate RV dysfunction, mild-moderate MR, moderate-severe TR, severe biatrial enlargement, no more than moderate AS.  Last cath in 2/22 showed nonobstructive mild CAD, elevated right and left heart filling pressures with low CI 2.04; AS no more than moderate.  Cause of cardiomyopathy uncertain, he is generally in NSR so unlikely to be tachy-mediated CMP.  Possible remote viral infection/viral myocarditis.  NYHA class IV at admission, volume overloaded on exam with CVP 17-18 currently.  Co-ox 58% on milrinone 0.375.  Low output HF.  He is on Lasix gtt 12 mg/hr + metolazone 2.5 with good diuresis yesterday again.  - Continue milrinone 0.375 while we are diuresing.  - Continue Lasix gtt 12 mg/hr and will give metolazone 2.5 mg x 1 again today.  - Continue digoxin 0.125 daily - Aggressive K repletion and repeat BMET in pm, discussed with pharmacy.  Follow Mg.  - Continue Farxiga and spironolactone 25 mg daily.  - Start losartan 12.5 mg daily.  - Long-term, concerned about his trajectory.  Has been doing poorly at home.  Suspect low output HF, would like to fully diurese him, then try to wean milrinone and repeat RHC (probably Monday).  He may be a candidate for LVAD placement.  Creatinine good, I do have concern about his RV (moderate dysfunction).  2. Atrial fibrillation: Paroxysmal.  He is on Tikosyn, QTc around 500 msec on last ECG.  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  - Continue Tikosyn.  - Continue Eliquis.  - Replace K and Mg.  3.  Aortic stenosis: Based on full evaluation (cath and echo), suspect no more than moderate aortic stenosis.  I do not think he would benefit from TAVR at this point.  4. 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  - statin.   Mobilize with cardiac rehab.   Length of Stay: 3  Zacchaeus Halm Ledyard,  MD  10/01/2020, 8:49 AM  Advanced Heart Failure Team Pager [phone] (M-F; 7a - 5p)  Please contact [facility] for night-coverage after hours (5p -7a ) and weekends on [email or web address]

## 2020-10-01 NOTE — Progress Notes (Signed)
Physical Therapy Treatment Patient Details Name: Chad Wilkerson MRN: [id number] DOB: Feb 10, 1946 Today's Date: 10/01/2020    History of Present Illness Pt is a 75 y.o. male admitted 09/28/20 with SOB and weight gain. 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 TTE 4/19 showed EF <20%. PMH includes CHF, NICM s/p ICD, afib on Eliquis and Tikosyn, DM2, HTN, OSA on BiPAP.   PT Comments    Pt seen for additional session; tolerated short ambulation distance and prolonged bout of standing ADL tasks without DME, supervision for safety. Initiated educ re: activity recommendations, energy conservation strategies, importance of mobility. Reduced pt to 2L O2 at end of session (RN aware); pt reports he does not wear O2 during day at baseline. Will continue to follow acutely.  SpO2 98% on 3L at rest SpO2 94% on 3L with activity    Follow Up Recommendations  Home health PT;Supervision for mobility/OOB     Equipment Recommendations  None recommended by PT    Recommendations for Other Services       Precautions / Restrictions Precautions Precautions: Fall;Other (comment) Precaution Comments: Watch SpO2 - does not wear O2 baseline (except CPAP overnight) Restrictions Weight Bearing Restrictions: No    Mobility  Bed Mobility Overal bed mobility: Modified Independent             General bed mobility comments: received sitting EOB    Transfers Overall transfer level: Needs assistance Equipment used: None Transfers: Sit to/from Stand Sit to Stand: Supervision         General transfer comment: Pt declined use of DME; supervision for safety due to fall risk  Ambulation/Gait Ambulation/Gait assistance: Supervision Gait Distance (Feet): 14 Feet Assistive device: None Gait Pattern/deviations: Step-through pattern;Decreased stride length Gait velocity: Decreased   General Gait Details: Short ambulation distance in room without DME, supervision for safety/lines; pt with prolonged  standing at sink for ADL tasks, declined additional ambulation distance secondary to fatigue   Stairs             Wheelchair Mobility    Modified Rankin (Stroke Patients Only)       Balance Overall balance assessment: Needs assistance   Sitting balance-Leahy Scale: Good Sitting balance - Comments: Prolonged sitting EOB without assist     Standing balance-Leahy Scale: Good Standing balance comment: Prolonged static standing at sink for ADL tasks (washing face, brushing teeth) without UE support                            Cognition Arousal/Alertness: Awake/alert Behavior During Therapy: WFL for tasks assessed/performed Overall Cognitive Status: Within Functional Limits for tasks assessed                                 General Comments: WFL for simple tasks, not formally assessed; requires repetition secondary to [facility]      Exercises      General Comments General comments (skin integrity, edema, etc.): Increased time discussing HHPT services - pt willing to consider, but not sure he wants      Pertinent Vitals/Pain Pain Assessment: Faces Faces Pain Scale: No hurt Pain Location: Chest "muscle pull" Pain Descriptors / Indicators: Sore Pain Intervention(s): Monitored during session    Home Living Family/patient expects to be discharged to:: Private residence Living Arrangements: Spouse/significant other (girlfriend/caregiver) Available Help at Discharge: Friend(s);Available 24 hours/day Type of Home: House Home Access: Level entry  Home Layout: Two level;Bed/bath upstairs Home Equipment: Gilmer Mor - single point Additional Comments: Lives with significant other who also acts as caregiver. Pt plans to move to single story home with children for easier accessibility    Prior Function Level of Independence: Needs assistance  Gait / Transfers Assistance Needed: Mod indep ambulator with intermittent use of SPC ADL's / Homemaking Assistance  Needed: Significant other assists with ADLs as needed; she perform majority of household tasks Comments: Wears CPAP (does not wear O2 during day)   PT Goals (current goals can now be found in the care plan section) Acute Rehab PT Goals Patient Stated Goal: Get some rest, return home PT Goal Formulation: With patient Time For Goal Achievement: 10/15/20 Potential to Achieve Goals: Good Progress towards PT goals: Progressing toward goals    Frequency    Min 3X/week      PT Plan Current plan remains appropriate    Co-evaluation              AM-PAC PT "6 Clicks" Mobility   Outcome Measure  Help needed turning from your back to your side while in a flat bed without using bedrails?: None Help needed moving from lying on your back to sitting on the side of a flat bed without using bedrails?: A Little Help needed moving to and from a bed to a chair (including a wheelchair)?: A Little Help needed standing up from a chair using your arms (e.g., wheelchair or bedside chair)?: A Little Help needed to walk in hospital room?: A Little Help needed climbing 3-5 steps with a railing? : A Little 6 Click Score: 19    End of Session   Activity Tolerance: Patient tolerated treatment well;Patient limited by fatigue Patient left: in chair;with call bell/phone within reach;with chair alarm set;with nursing/sitter in room Nurse Communication: Mobility status PT Visit Diagnosis: Other abnormalities of gait and mobility (R26.89)     Time: 0092-3300 PT Time Calculation (min) (ACUTE ONLY): 19 min  Charges:  $Therapeutic Activity: 8-22 mins                     Ina Homes, PT, DPT Acute Rehabilitation Services  Pager [phone] Office [phone]  Malachy Chamber 10/01/2020, 10:37 AM

## 2020-10-01 NOTE — Care Management Important Message (Signed)
Important Message  Patient Details  Name: Chad Wilkerson MRN: [id number] Date of Birth: 11/09/1945   Medicare Important Message Given:  Yes     Renie Ora 10/01/2020, 10:07 AM

## 2020-10-01 NOTE — Progress Notes (Addendum)
Patient ID: Chad Wilkerson, male   DOB: 1946-05-10, 75 y.o.   MRN: [id number]  Patient had run of VT terminated by shock from ICD, ?polymorphic.   Stat BMET to be sent.    I wanted to start him on amiodarone and to hold Tikosyn, but he says that he cannot take amiodarone.  I will decrease milrinone to 0.25 for now and ask EP to see.  QTc yesterday was around 500 msec => ?if VT is related to Tikosyn (possible torsades) and we need to stop it.  We could transition him over the mexiletine presumably.    Chad Wilkerson 10/01/2020 2:28 PM

## 2020-10-01 NOTE — Progress Notes (Signed)
   10/01/20 1220  Mobility  Activity Refused mobility (Unspecified reasons.)

## 2020-10-02 DIAGNOSIS — I350 Nonrheumatic aortic (valve) stenosis: Secondary | ICD-10-CM | POA: Diagnosis not present

## 2020-10-02 DIAGNOSIS — I5043 Acute on chronic combined systolic (congestive) and diastolic (congestive) heart failure: Secondary | ICD-10-CM | POA: Diagnosis not present

## 2020-10-02 DIAGNOSIS — I5023 Acute on chronic systolic (congestive) heart failure: Secondary | ICD-10-CM | POA: Diagnosis not present

## 2020-10-02 DIAGNOSIS — I480 Paroxysmal atrial fibrillation: Secondary | ICD-10-CM | POA: Diagnosis not present

## 2020-10-02 LAB — CBC
HCT: 41.6 % (ref 39.0–52.0)
Hemoglobin: 13.8 g/dL (ref 13.0–17.0)
MCH: 29.1 pg (ref 26.0–34.0)
MCHC: 33.2 g/dL (ref 30.0–36.0)
MCV: 87.6 fL (ref 80.0–100.0)
Platelets: 253 10*3/uL (ref 150–400)
RBC: 4.75 MIL/uL (ref 4.22–5.81)
RDW: 16.1 % — ABNORMAL HIGH (ref 11.5–15.5)
WBC: 9.5 10*3/uL (ref 4.0–10.5)
nRBC: 0 % (ref 0.0–0.2)

## 2020-10-02 LAB — BASIC METABOLIC PANEL
Anion gap: 9 (ref 5–15)
Anion gap: 9 (ref 5–15)
Anion gap: 10 (ref 5–15)
BUN: 18 mg/dL (ref 8–23)
BUN: 19 mg/dL (ref 8–23)
BUN: 24 mg/dL — ABNORMAL HIGH (ref 8–23)
CO2: 33 mmol/L — ABNORMAL HIGH (ref 22–32)
CO2: 32 mmol/L (ref 22–32)
CO2: 30 mmol/L (ref 22–32)
Calcium: 8.9 mg/dL (ref 8.9–10.3)
Calcium: 8.9 mg/dL (ref 8.9–10.3)
Calcium: 9.2 mg/dL (ref 8.9–10.3)
Chloride: 89 mmol/L — ABNORMAL LOW (ref 98–111)
Chloride: 90 mmol/L — ABNORMAL LOW (ref 98–111)
Chloride: 92 mmol/L — ABNORMAL LOW (ref 98–111)
Creatinine, Ser: 0.99 mg/dL (ref 0.61–1.24)
Creatinine, Ser: 0.93 mg/dL (ref 0.61–1.24)
Creatinine, Ser: 1.16 mg/dL (ref 0.61–1.24)
GFR, Estimated: >60 mL/min (ref >60)
GFR, Estimated: >60 mL/min (ref >60)
GFR, Estimated: >60 mL/min (ref >60)
Glucose, Bld: 201 mg/dL — ABNORMAL HIGH (ref 70–99)
Glucose, Bld: 234 mg/dL — ABNORMAL HIGH (ref 70–99)
Glucose, Bld: 182 mg/dL — ABNORMAL HIGH (ref 70–99)
Potassium: 3.4 mmol/L — ABNORMAL LOW (ref 3.5–5.1)
Potassium: 3.2 mmol/L — ABNORMAL LOW (ref 3.5–5.1)
Potassium: 3.5 mmol/L (ref 3.5–5.1)
Sodium: 131 mmol/L — ABNORMAL LOW (ref 135–145)
Sodium: 131 mmol/L — ABNORMAL LOW (ref 135–145)
Sodium: 132 mmol/L — ABNORMAL LOW (ref 135–145)

## 2020-10-02 LAB — GLUCOSE, CAPILLARY
Glucose-Capillary: 183 mg/dL — ABNORMAL HIGH (ref 70–99)
Glucose-Capillary: 195 mg/dL — ABNORMAL HIGH (ref 70–99)
Glucose-Capillary: 162 mg/dL — ABNORMAL HIGH (ref 70–99)
Glucose-Capillary: 213 mg/dL — ABNORMAL HIGH (ref 70–99)
Glucose-Capillary: 239 mg/dL — ABNORMAL HIGH (ref 70–99)

## 2020-10-02 LAB — COOXEMETRY PANEL
Carboxyhemoglobin: 1.4 % (ref 0.5–1.5)
Methemoglobin: 0.7 % (ref 0.0–1.5)
O2 Saturation: 56.4 %
Total hemoglobin: 14.3 g/dL (ref 12.0–16.0)

## 2020-10-02 LAB — MAGNESIUM: Magnesium: 2.1 mg/dL (ref 1.7–2.4)

## 2020-10-02 MED ORDER — METOLAZONE 2.5 MG PO TABS
2.5000 mg | ORAL_TABLET | Freq: Once | ORAL | Status: AC
  Administered 2020-10-02: 2.5 mg via ORAL
  Filled 2020-10-02: qty 1

## 2020-10-02 MED ORDER — POTASSIUM CHLORIDE 10 MEQ/100ML IV SOLN
10.0000 meq | INTRAVENOUS | Status: AC
  Administered 2020-10-02 (×3): 10 meq via INTRAVENOUS
  Filled 2020-10-02 (×3): qty 100

## 2020-10-02 MED ORDER — POTASSIUM CHLORIDE CRYS ER 20 MEQ PO TBCR
40.0000 meq | EXTENDED_RELEASE_TABLET | ORAL | Status: AC
  Administered 2020-10-02 (×2): 40 meq via ORAL
  Filled 2020-10-02 (×2): qty 2

## 2020-10-02 MED ORDER — POTASSIUM CHLORIDE CRYS ER 20 MEQ PO TBCR
40.0000 meq | EXTENDED_RELEASE_TABLET | ORAL | Status: DC
  Administered 2020-10-02 (×3): 40 meq via ORAL
  Filled 2020-10-02 (×3): qty 2

## 2020-10-02 NOTE — Progress Notes (Signed)
Physical Therapy Treatment Patient Details Name: Chad Wilkerson MRN: [id number] DOB: 06-Feb-1946 Today's Date: 10/02/2020    History of Present Illness Pt is a 75 y.o. male admitted 09/28/20 with SOB and weight gain. 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 TTE 4/19 showed EF <20%. Episode of VF terminated by ICD shock on 4/22. PMH includes CHF, NICM s/p ICD, afib on Eliquis and Tikosyn, DM2, HTN, OSA on BiPAP.   PT Comments    Pt progressing well with mobility. Tolerated hallway ambulation with SPC and intermittent min guard for balance; DOE 3/4, cues for activity pacing and pursed lip breathing. Pt motivated to participate and hopeful for d/c home soon. Will continue to follow acutely.    Follow Up Recommendations  Home health PT;Supervision - Intermittent     Equipment Recommendations  None recommended by PT    Recommendations for Other Services       Precautions / Restrictions Precautions Precautions: Fall;Other (comment) Precaution Comments: Watch SpO2 - does not wear O2 baseline (except CPAP overnight) Restrictions Weight Bearing Restrictions: No    Mobility  Bed Mobility Overal bed mobility: Modified Independent                  Transfers Overall transfer level: Needs assistance Equipment used: Straight cane Transfers: Sit to/from Stand Sit to Stand: Supervision            Ambulation/Gait Ambulation/Gait assistance: Min guard;Supervision Gait Distance (Feet): 250 Feet Assistive device: Straight cane Gait Pattern/deviations: Step-through pattern;Decreased stride length Gait velocity: Decreased   General Gait Details: Slow, mostly steady gait with SPC and initial min guard for balance, progressing to supervision for safety/line management; cues for activity pacing and pursed lip breathing; DOE 3/4   Stairs             Wheelchair Mobility    Modified Rankin (Stroke Patients Only)       Balance Overall balance assessment: Needs  assistance Sitting-balance support: No upper extremity supported;Feet supported Sitting balance-Leahy Scale: Good       Standing balance-Leahy Scale: Fair                              Cognition Arousal/Alertness: Awake/alert Behavior During Therapy: WFL for tasks assessed/performed Overall Cognitive Status: Within Functional Limits for tasks assessed                                 General Comments: WFL for simple tasks, not formally assessed; requires repetition secondary to [facility]      Exercises      General Comments General comments (skin integrity, edema, etc.): SpO2 down to 80% on RA with ambulation (inconsistent pleth), up to 92% on 2L O2 [place] with ambulation; maintaining 93% on RA at rest      Pertinent Vitals/Pain Pain Assessment: Faces Faces Pain Scale: No hurt Pain Intervention(s): Monitored during session    Home Living                      Prior Function            PT Goals (current goals can now be found in the care plan section) Progress towards PT goals: Progressing toward goals    Frequency    Min 3X/week      PT Plan Current plan remains appropriate    Co-evaluation  AM-PAC PT "6 Clicks" Mobility   Outcome Measure  Help needed turning from your back to your side while in a flat bed without using bedrails?: None Help needed moving from lying on your back to sitting on the side of a flat bed without using bedrails?: None Help needed moving to and from a bed to a chair (including a wheelchair)?: A Little Help needed standing up from a chair using your arms (e.g., wheelchair or bedside chair)?: A Little Help needed to walk in hospital room?: A Little Help needed climbing 3-5 steps with a railing? : A Little 6 Click Score: 20    End of Session Equipment Utilized During Treatment: Gait belt Activity Tolerance: Patient tolerated treatment well Patient left: in chair;with call bell/phone within  reach Nurse Communication: Mobility status;Other (comment) (left on RA, chair alarm box not working) PT Visit Diagnosis: Other abnormalities of gait and mobility (R26.89)     Time: 1696-7893 PT Time Calculation (min) (ACUTE ONLY): 21 min  Charges:  $Gait Training: 8-22 mins                     Ina Homes, PT, DPT Acute Rehabilitation Services  Pager [phone] Office [phone]  Malachy Chamber 10/02/2020, 11:08 AM

## 2020-10-02 NOTE — Progress Notes (Signed)
Pt has home CPAP, no assistance needed from RT

## 2020-10-02 NOTE — Progress Notes (Signed)
PROGRESS NOTE    Chad Wilkerson  OXB:[id number] DOB: 10-Feb-1946 DOA: 09/28/2020 PCP: [name], NP    Chief Complaint  Patient presents with  . Shortness of Breath   Brief Narrative:  Chad Wilkerson is Chad Wilkerson 75 y.o. male with medical history significant for chronic combined systolic and diastolic CHF (EF less than 20%, G3 DD by TTE 09/28/2020), NICM, s/p ICD, atrial fibrillation on Eliquis and Tikosyn, T2DM, HTN, and OSA on BiPAP who presented to the ED for evaluation of shortness of breath and weightgain.  Patient reports 1 week of progressive dyspnea, DOE, orthopnea weight gain.  He was seen in follow-up in his cardiology office earlier today.  He underwent echocardiogram which showed EF <20%.  He had significant shortness of breath therefore was sent to the ED for further management.  He's been admitted with HF exacerbation.  Assessment & Plan:   Principal Problem:   Acute on chronic combined systolic (congestive) and diastolic (congestive) heart failure (HCC) Active Problems:   Sleep apnea   Paroxysmal atrial fibrillation (HCC)   ICD (implantable cardioverter-defibrillator) in place  Chad Wilkerson is Chad Wilkerson 75 y.o. male with medical history significant for chronic combined systolic and diastolic CHF (EF less than 20%, G3 DD by TTE 09/28/2020), NICM, s/p ICD, atrial fibrillation on Eliquis and Tikosyn, T2DM, HTN, and OSA on BiPAP who is admitted with acute on chronic combined systolic and diastolic CHF.  Ventricular Fibrillation  - run of vfib this PM s/p shock from ICD - continue amiodarone per cards.  Continue milrinone for now. - holding tikosyn at this time - appreciate cards assistance  Acute on chronic combined systolic and diastolic CHF s/p ICD: TTE 4/19 prior to admit showed EF <20% with G3 DD. Also moderately reduced RVSF.   Cardiology following and managing diuresis.  At risk for low output heart failure and therefore started on milrinone. -Cardiology  following - appreciate assistance -> milrinone, lasix gtt per cards, farxiga, spironolactone increased to 25 mg daily, losartan 12.5 mg daily - adding digoxin - metolazone again per cards - possibly repeat RHC after diuresed, possible LVAD candidate? -Monitor strict I/O's and daily weights  Hypokalemia Replace and follow  Hypotension: Holding home Coreg.  On milrinone as above.  IV lasix.  Spironolactone.  Losartan  Paroxysmal atrial fibrillation: Continue Eliquis and Tikosyn.  Holding Coreg.  Type 2 diabetes: Hold home metformin.  Continue farxiga.  SSI.   OSA: Continue BiPAP nightly.  DVT prophylaxis: eliquis Code Status: DNR Family Communication: none at bedside Disposition:   Status is: Inpatient  Remains inpatient appropriate because:Inpatient level of care appropriate due to severity of illness   Dispo: The patient is from: home              Anticipated d/c is to: pending              Patient currently is not medically stable to d/c.   Difficult to place patient No       Consultants:   cardiology  Procedures: Echo IMPRESSIONS    1. Left ventricular ejection fraction, by estimation, is <20%. The left  ventricle has severely decreased function. The left ventricle demonstrates  global hypokinesis. The left ventricular internal cavity size was severely  dilated. Left ventricular  diastolic parameters are consistent with Grade III diastolic dysfunction  (restrictive).  2. Right ventricular systolic function is moderately reduced. The right  ventricular size is mildly enlarged. There is normal pulmonary artery  systolic pressure.  3.  Left atrial size was severely dilated.  4. Right atrial size was severely dilated.  5. The mitral valve is grossly normal. Mild to moderate mitral valve  regurgitation.  6. Tricuspid valve regurgitation is moderate to severe.  7. The aortic valve is grossly normal. Aortic valve regurgitation is not  visualized.   8. Aortic dilatation noted. There is mild dilatation of the ascending  aorta, measuring 41 mm.  Antimicrobials: Anti-infectives (From admission, onward)   None         Subjective: No new complaints today Feeling better than yesterday  Objective: Vitals:   10/02/20 0425 10/02/20 0821 10/02/20 0841 10/02/20 1228  BP:  93/69  91/66  Pulse: 77 73 96 70  Resp:  20 (!) 21 20  Temp:  98 F (36.7 C)  97.7 F (36.5 C)  TempSrc:  Oral  Oral  SpO2:  95% 93% 93%  Weight:      Height:        Intake/Output Summary (Last 24 hours) at 10/02/2020 1544 Last data filed at 10/02/2020 1100 Gross per 24 hour  Intake 2135.69 ml  Output 4450 ml  Net -2314.31 ml   Filed Weights   09/30/20 0347 10/01/20 0456 10/02/20 0420  Weight: 107.5 kg 103 kg 102.1 kg    Examination:  General: No acute distress. Cardiovascular: Heart sounds show [name] regular rate, and rhythm Lungs: Clear to auscultation bilaterally  Abdomen: Soft, nontender, nondistended  Neurological: Alert and oriented 3. Moves all extremities 4 . Cranial nerves II through XII grossly intact. Skin: Warm and dry. No rashes or lesions. Extremities: No clubbing or cyanosis. No edema.      Data Reviewed: I have personally reviewed following labs and imaging studies  CBC: Recent Labs  Lab 09/28/20 1612 09/29/20 0040 09/29/20 1419 09/30/20 0429 10/01/20 0450 10/02/20 0447  WBC 8.9 9.7 8.2 9.0 9.3 9.5  NEUTROABS 6.8  --   --   --   --   --   HGB 15.0 14.2 13.2 13.9 14.2 13.8  HCT 45.8 43.3 40.1 41.7 43.1 41.6  MCV 89.3 88.4 88.7 87.8 88.5 87.6  PLT 257 234 200 213 231 253    Basic Metabolic Panel: Recent Labs  Lab 09/29/20 1419 09/30/20 0429 09/30/20 1730 10/01/20 0450 10/01/20 1443 10/01/20 2216 10/02/20 0447  NA  --  137 135 136 135 131* 131*  K  --  2.8* 3.2* 3.3* 2.6* 3.4* 3.2*  CL  --  100 95* 96* 89* 89* 90*  CO2  --  26 30 31  33* 33* 32  GLUCOSE  --  136* 194* 145* 187* 201* 234*  BUN  --  12 12 14  14 18 19   CREATININE  --  0.88 0.95 0.98 0.94 0.99 0.93  CALCIUM  --  8.4* 8.8* 9.1 9.3 8.9 8.9  MG 2.0 2.0  --  2.2 2.2  --  2.1    GFR: Estimated Creatinine Clearance: 81.4 mL/min (by C-G formula based on SCr of 0.93 mg/dL).  Liver Function Tests: Recent Labs  Lab 09/28/20 1612  AST 33  ALT 25  ALKPHOS 77  BILITOT 1.5*  PROT 6.9  ALBUMIN 3.6    CBG: Recent Labs  Lab 10/01/20 1709 10/01/20 2054 10/02/20 0615 10/02/20 0725 10/02/20 1152  GLUCAP 187* 167* 183* 195* 162*     Recent Results (from the past 240 hour(s))  Resp Panel by RT-PCR (Flu Laquida Cotrell&B, Covid) Nasopharyngeal Swab     Status: None   Collection Time: 09/28/20  4:13 PM   Specimen: Nasopharyngeal Swab; Nasopharyngeal(NP) swabs in vial transport medium  Result Value Ref Range Status   SARS Coronavirus 2 by RT PCR NEGATIVE NEGATIVE Final    Comment: (NOTE) SARS-CoV-2 target nucleic acids are NOT DETECTED.  The SARS-CoV-2 RNA is generally detectable in upper respiratory specimens during the acute phase of infection. The lowest concentration of SARS-CoV-2 viral copies this assay can detect is 138 copies/mL. Gaia Gullikson negative result does not preclude SARS-Cov-2 infection and should not be used as the sole basis for treatment or other patient management decisions. Aloni Chuang negative result may occur with  improper specimen collection/handling, submission of specimen other than nasopharyngeal swab, presence of viral mutation(s) within the areas targeted by this assay, and inadequate number of viral copies(<138 copies/mL). Doniesha Landau negative result must be combined with clinical observations, patient history, and epidemiological information. The expected result is Negative.  Fact Sheet for Patients:  BloggerCourse.com  Fact Sheet for Healthcare Providers:  SeriousBroker.it  This test is no t yet approved or cleared by the [place] FDA and  has been authorized for detection  and/or diagnosis of SARS-CoV-2 by FDA under an Emergency Use Authorization (EUA). This EUA will remain  in effect (meaning this test can be used) for the duration of the COVID-19 declaration under Section 564(b)(1) of the Act, 21 U.S.C.section 360bbb-3(b)(1), unless the authorization is terminated  or revoked sooner.       Influenza Zenon Leaf by PCR NEGATIVE NEGATIVE Final   Influenza B by PCR NEGATIVE NEGATIVE Final    Comment: (NOTE) The Xpert Xpress SARS-CoV-2/FLU/RSV plus assay is intended as an aid in the diagnosis of influenza from Nasopharyngeal swab specimens and should not be used as Samiya Mervin sole basis for treatment. Nasal washings and aspirates are unacceptable for Xpert Xpress SARS-CoV-2/FLU/RSV testing.  Fact Sheet for Patients: BloggerCourse.com  Fact Sheet for Healthcare Providers: SeriousBroker.it  This test is not yet approved or cleared by the [place] FDA and has been authorized for detection and/or diagnosis of SARS-CoV-2 by FDA under an Emergency Use Authorization (EUA). This EUA will remain in effect (meaning this test can be used) for the duration of the COVID-19 declaration under Section 564(b)(1) of the Act, 21 U.S.C. section 360bbb-3(b)(1), unless the authorization is terminated or revoked.  Performed at [facility], [address] [place], [place] [address]          Radiology Studies: No results found.      Scheduled Meds: . apixaban  5 mg Oral BID  . atorvastatin  20 mg Oral Daily  . Chlorhexidine Gluconate Cloth  6 each Topical Daily  . dapagliflozin propanediol  10 mg Oral Daily  . digoxin  0.125 mg Oral Daily  . docusate sodium  100 mg Oral BID  . escitalopram  10 mg Oral Daily  . insulin aspart  0-9 Units Subcutaneous TID WC  . losartan  12.5 mg Oral Daily  . mometasone-formoterol  2 puff Inhalation BID  . polyethylene glycol  17 g Oral BID  . potassium chloride  40 mEq Oral Q4H   . sodium chloride flush  10-40 mL Intracatheter Q12H  . sodium chloride flush  3 mL Intravenous Q12H  . spironolactone  25 mg Oral Daily   Continuous Infusions: . sodium chloride    . amiodarone 30 mg/hr (10/02/20 1219)  . furosemide (LASIX) 200 mg in dextrose 5% 100 mL (2mg /mL) infusion 15 mg/hr (10/02/20 1039)  . milrinone 0.25 mcg/kg/min (10/02/20 10/04/20)  LOS: 4 days    Time spent: over 30 min    Lacretia Nicks, MD [facility]   To contact the attending provider between 7A-7P or the covering provider during after hours 7P-7A, please log into the web site [email or web address] and access using universal [place] password for that web site. If you do not have the password, please call the hospital operator.  10/02/2020, 3:44 PM

## 2020-10-02 NOTE — Progress Notes (Signed)
Patient ID: Chad Wilkerson, male   DOB: 25-Jun-1945, 75 y.o.   MRN: [id number]     Advanced Heart Failure Rounding Note  PCP-Cardiologist: Christell Constant, MD   Subjective:    Good diuresis again yesterday after getting metolazone 2.5 x 1.  K low at 3.2, creatinine stable.  SBP 90s-110s.  Co-ox 56% on milrinone 0.25.  He remains on Lasix gtt 12 mg/hr.  CVP 15-16 today.    VF with ICD shock on 4/22.  Tikosyn stopped and amiodarone gtt started.  No further VT/VF but still with PVCs.     Objective:   Weight Range: 102.1 kg Body mass index is 33.73 kg/m.   Vital Signs:   Temp:  [98 F (36.7 C)-99 F (37.2 C)] 98 F (36.7 C) (04/23 0821) Pulse Rate:  [71-96] 96 (04/23 0841) Resp:  [14-21] 21 (04/23 0841) BP: (93-115)/(67-89) 93/69 (04/23 0821) SpO2:  [91 %-97 %] 93 % (04/23 0841) FiO2 (%):  [32 %] 32 % (04/22 2057) Weight:  [102.1 kg] 102.1 kg (04/23 0420) Last BM Date: 10/01/20  Weight change: Filed Weights   09/30/20 0347 10/01/20 0456 10/02/20 0420  Weight: 107.5 kg 103 kg 102.1 kg    Intake/Output:   Intake/Output Summary (Last 24 hours) at 10/02/2020 0925 Last data filed at 10/02/2020 0433 Gross per 24 hour  Intake 2315.69 ml  Output 3500 ml  Net -1184.31 ml      Physical Exam    General: NAD Neck: JVP 14+ cm, no thyromegaly or thyroid nodule.  Lungs: Clear to auscultation bilaterally with normal respiratory effort. CV: Lateral PMI.  Heart regular S1/S2, no S3/S4, 1/6 SEM RUSB.  No peripheral edema.   Abdomen: Soft, nontender, no hepatosplenomegaly, mild distention.  Skin: Intact without lesions or rashes.  Neurologic: Alert and oriented x 3.  Psych: Normal affect. Extremities: No clubbing or cyanosis.  HEENT: Normal.    Telemetry   NSR with 1st degree AVB, PVCs (personally reviewed)   Labs    CBC Recent Labs    10/01/20 0450 10/02/20 0447  WBC 9.3 9.5  HGB 14.2 13.8  HCT 43.1 41.6  MCV 88.5 87.6  PLT 231 253   Basic Metabolic  Panel Recent Labs    10/01/20 1443 10/01/20 2216 10/02/20 0447  NA 135 131* 131*  K 2.6* 3.4* 3.2*  CL 89* 89* 90*  CO2 33* 33* 32  GLUCOSE 187* 201* 234*  BUN 14 18 19   CREATININE 0.94 0.99 0.93  CALCIUM 9.3 8.9 8.9  MG 2.2  --  2.1   Liver Function Tests No results for input(s): AST, ALT, ALKPHOS, BILITOT, PROT, ALBUMIN in the last 72 hours. No results for input(s): LIPASE, AMYLASE in the last 72 hours. Cardiac Enzymes No results for input(s): CKTOTAL, CKMB, CKMBINDEX, TROPONINI in the last 72 hours.  BNP: BNP (last 3 results) Recent Labs    09/28/20 1613  BNP 3,328.4*    ProBNP (last 3 results) Recent Labs    04/01/20 1509  PROBNP 5,001*     D-Dimer No results for input(s): DDIMER in the last 72 hours. Hemoglobin A1C No results for input(s): HGBA1C in the last 72 hours. Fasting Lipid Panel No results for input(s): CHOL, HDL, LDLCALC, TRIG, CHOLHDL, LDLDIRECT in the last 72 hours. Thyroid Function Tests No results for input(s): TSH, T4TOTAL, T3FREE, THYROIDAB in the last 72 hours.  Invalid input(s): FREET3  Other results:   Imaging    No results found.   Medications:  Scheduled Medications: . apixaban  5 mg Oral BID  . atorvastatin  20 mg Oral Daily  . Chlorhexidine Gluconate Cloth  6 each Topical Daily  . dapagliflozin propanediol  10 mg Oral Daily  . digoxin  0.125 mg Oral Daily  . docusate sodium  100 mg Oral BID  . escitalopram  10 mg Oral Daily  . insulin aspart  0-9 Units Subcutaneous TID WC  . losartan  12.5 mg Oral Daily  . metolazone  2.5 mg Oral Once  . mometasone-formoterol  2 puff Inhalation BID  . polyethylene glycol  17 g Oral BID  . potassium chloride  40 mEq Oral Q4H  . sodium chloride flush  10-40 mL Intracatheter Q12H  . sodium chloride flush  3 mL Intravenous Q12H  . spironolactone  25 mg Oral Daily    Infusions: . sodium chloride    . amiodarone 30 mg/hr (10/02/20 0203)  . furosemide (LASIX) 200 mg in dextrose  5% 100 mL (2mg /mL) infusion 12 mg/hr (10/02/20 0646)  . milrinone 0.25 mcg/kg/min (10/02/20 0644)    PRN Medications: acetaminophen **OR** acetaminophen, ondansetron **OR** ondansetron (ZOFRAN) IV, polyethylene glycol, sodium chloride flush   Assessment/Plan   1. Acute on chronic systolic CHF: Long-standing NICM, followed in the past in [place].  Has MDT ICD.  Echo 09/28/20 with EF <20%, severe LV dilation, mild RV dilation with moderate RV dysfunction, mild-moderate MR, moderate-severe TR, severe biatrial enlargement, no more than moderate AS.  Last cath in 2/22 showed nonobstructive mild CAD, elevated right and left heart filling pressures with low CI 2.04; AS no more than moderate.  Cause of cardiomyopathy uncertain, he is generally in NSR so unlikely to be tachy-mediated CMP.  Possible remote viral infection/viral myocarditis.  NYHA class IV at admission, volume overloaded on exam with CVP 15-16 currently (CVP trending down and weight down).  Co-ox 56% on milrinone 0.25.  Low output HF.  He is on Lasix gtt 12 mg/hr + metolazone 2.5 with good diuresis yesterday again.  - Continue milrinone 0.25, I decreased it from 0.375 with VF yesterday.  - Increase Lasix gtt to 15 mg/hr and will give metolazone 2.5 mg x 1 again today.  - Continue digoxin 0.125 daily - Aggressive K repletion and repeat BMET in pm, discussed with pharmacy.  Follow Mg.  - Continue Farxiga and spironolactone 25 mg daily.  - Continue losartan 12.5 mg daily.  - Long-term, concerned about his trajectory.  Has been doing poorly at home.  Suspect low output HF, would like to fully diurese him, then try to wean milrinone and repeat RHC (early next week).  He may be a candidate for LVAD placement.  Creatinine good, I do have concern about his RV (moderate dysfunction).  2. Atrial fibrillation: Paroxysmal.  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 Off Tikosyn after VF on 4/22.  Did not tolerate amiodarone in the past but doing well on it  so far.  - Continue amiodarone gtt - Continue Eliquis.  - Replace K and Mg.  3. Aortic stenosis: Based on full evaluation (cath and echo), suspect no more than moderate aortic stenosis.  I do not think he would benefit from TAVR at this point.  4. 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  - statin.  5. VF: VF terminated by ICD shock on 4/22.  Milrinone decreased to 0.25.  Tikosyn stopped and amiodarone gtt started.  - Continue amiodarone gtt.  - Replace K aggressive and repeat BMET in pm.  Mobilize with cardiac rehab.   Length of Stay: 4  [name], MD  10/02/2020, 9:25 AM  Advanced Heart Failure Team Pager [phone] (M-F; 7a - 5p)  Please contact [facility] for night-coverage after hours (5p -7a ) and weekends on [email or web address]

## 2020-10-02 NOTE — Progress Notes (Signed)
 Progress Note  Patient Name: Chad Wilkerson Date of Encounter: 10/02/2020  [facility] Cardiologist: Mahesh A Chandrasekhar, MD   Subjective   NAEO. No additional shocks from ICD since yesterday afternoon ~ 14:00. Patient tells me he is tolerating amiodarone without off-target effect. In bed eating fruit cup this AM.  Inpatient Medications    Scheduled Meds: . apixaban  5 mg Oral BID  . atorvastatin  20 mg Oral Daily  . Chlorhexidine Gluconate Cloth  6 each Topical Daily  . dapagliflozin propanediol  10 mg Oral Daily  . digoxin  0.125 mg Oral Daily  . docusate sodium  100 mg Oral BID  . escitalopram  10 mg Oral Daily  . insulin aspart  0-9 Units Subcutaneous TID WC  . losartan  12.5 mg Oral Daily  . mometasone-formoterol  2 puff Inhalation BID  . polyethylene glycol  17 g Oral BID  . potassium chloride  40 mEq Oral Q4H  . sodium chloride flush  10-40 mL Intracatheter Q12H  . sodium chloride flush  3 mL Intravenous Q12H  . spironolactone  25 mg Oral Daily   Continuous Infusions: . sodium chloride    . amiodarone 30 mg/hr (10/02/20 0203)  . furosemide (LASIX) 200 mg in dextrose 5% 100 mL (2mg/mL) infusion 12 mg/hr (10/02/20 0646)  . milrinone 0.25 mcg/kg/min (10/02/20 0644)   PRN Meds: acetaminophen **OR** acetaminophen, ondansetron **OR** ondansetron (ZOFRAN) IV, polyethylene glycol, sodium chloride flush   Vital Signs    Vitals:   10/01/20 2057 10/01/20 2309 10/02/20 0420 10/02/20 0425  BP:  94/67 111/72   Pulse: 77 71  77  Resp: 19 18 14   Temp:  99 F (37.2 C)    TempSrc:  Oral Oral   SpO2: 94% 94% 91%   Weight:   102.1 kg   Height:        Intake/Output Summary (Last 24 hours) at 10/02/2020 0741 Last data filed at 10/02/2020 0433 Gross per 24 hour  Intake 2315.69 ml  Output 4200 ml  Net -1884.31 ml   Last 3 Weights 10/02/2020 10/01/2020 09/30/2020  Weight (lbs) 225 lb 1.6 oz 227 lb 237 lb 1.6 oz  Weight (kg) 102.105 kg 102.967 kg 107.548 kg       Telemetry    NSVT. No sustained arrhythmias. No further VF episodes. - Personally Reviewed  ECG    n/a - Personally Reviewed  Physical Exam   GEN: No acute distress.  Chronically ill appearing. Neck: No JVD Cardiac: RRR, no murmurs, rubs, or gallops. Warm extremities. No edema. Respiratory: Clear to auscultation bilaterally. GI: Soft, nontender, non-distended  MS: No edema; No deformity. Neuro:  Nonfocal  Psych: Normal affect   Labs    High Sensitivity Troponin:   Recent Labs  Lab 09/28/20 1612 09/28/20 1812  TROPONINIHS 26* 28*      Chemistry Recent Labs  Lab 09/28/20 1612 09/29/20 0040 10/01/20 1443 10/01/20 2216 10/02/20 0447  NA 133*   < > 135 131* 131*  K 3.7   < > 2.6* 3.4* 3.2*  CL 98   < > 89* 89* 90*  CO2 26   < > 33* 33* 32  GLUCOSE 131*   < > 187* 201* 234*  BUN 20   < > 14 18 19  CREATININE 1.03   < > 0.94 0.99 0.93  CALCIUM 8.9   < > 9.3 8.9 8.9  PROT 6.9  --   --   --   --     ALBUMIN 3.6  --   --   --   --   AST 33  --   --   --   --   ALT 25  --   --   --   --   ALKPHOS 77  --   --   --   --   BILITOT 1.5*  --   --   --   --   GFRNONAA >60   < > >60 >60 >60  ANIONGAP 9   < > 13 9 9   < > = values in this interval not displayed.     Hematology Recent Labs  Lab 09/30/20 0429 10/01/20 0450 10/02/20 0447  WBC 9.0 9.3 9.5  RBC 4.75 4.87 4.75  HGB 13.9 14.2 13.8  HCT 41.7 43.1 41.6  MCV 87.8 88.5 87.6  MCH 29.3 29.2 29.1  MCHC 33.3 32.9 33.2  RDW 16.3* 16.3* 16.1*  PLT 213 231 253    BNP Recent Labs  Lab 09/28/20 1613  BNP 3,328.4*     DDimer No results for input(s): DDIMER in the last 168 hours.   Radiology    No results found.  Cardiac Studies   No new   Assessment & Plan    74yo pleasant man with severe NICM s/p ICD implant 12/04/2016 who I previously met to initiate dofetilide for his paroxysmal symptomatic AF is now admitted with decompensated HF complicated by ventricular arrhythmias including VF event  requiring ICD shock. Patient has been transitioned to amiodarone from dofetilide and initiated on milrinone for hemodynamic support.   #VT/VF No further sustained episodes overnight. Continue amiodarone IV for now. Will plan to transition to oral in next couple days. Keep K>4, Mg>2  #pAF Amiodarone eliquis  #Acutely decompensated chronic systolic HF 2/2 NICM Seems to be improving. On milrinone due to low CO Management per HF team  #Goals of Care Discussions ongoing  For questions or updates, please contact [facility] Please consult [email or web address] for contact info under        Signed, CAMERON T LAMBERT, MD  10/02/2020, 7:41 AM    

## 2020-10-02 NOTE — Progress Notes (Signed)
SATURATION QUALIFICATIONS: (This note is used to comply with regulatory documentation for home oxygen)  Patient Saturations on Room Air at Rest = 93%  Patient Saturations on Room Air while Ambulating = 80%  Patient Saturations on 2 Liters of oxygen while Ambulating = 92%  Please briefly explain why patient needs home oxygen: Patient required supplemental oxygen to maintain SpO2 >/88% with ambulation.  Ina Homes, PT, DPT Acute Rehabilitation Services  Pager [phone] Office [phone]

## 2020-10-03 ENCOUNTER — Telehealth: Payer: Self-pay | Admitting: Internal Medicine

## 2020-10-03 ENCOUNTER — Encounter (HOSPITAL_COMMUNITY)
Admission: EM | Disposition: A | Discharge status: Home / Self Care | Payer: Self-pay | Source: Home / Self Care | Attending: Family Medicine

## 2020-10-03 DIAGNOSIS — I5023 Acute on chronic systolic (congestive) heart failure: Secondary | ICD-10-CM | POA: Diagnosis not present

## 2020-10-03 DIAGNOSIS — I5043 Acute on chronic combined systolic (congestive) and diastolic (congestive) heart failure: Secondary | ICD-10-CM | POA: Diagnosis not present

## 2020-10-03 DIAGNOSIS — I480 Paroxysmal atrial fibrillation: Secondary | ICD-10-CM | POA: Diagnosis not present

## 2020-10-03 DIAGNOSIS — I350 Nonrheumatic aortic (valve) stenosis: Secondary | ICD-10-CM | POA: Diagnosis not present

## 2020-10-03 LAB — CBC
HCT: 42.5 % (ref 39.0–52.0)
Hemoglobin: 14.2 g/dL (ref 13.0–17.0)
MCH: 29.1 pg (ref 26.0–34.0)
MCHC: 33.4 g/dL (ref 30.0–36.0)
MCV: 87.1 fL (ref 80.0–100.0)
Platelets: 267 10*3/uL (ref 150–400)
RBC: 4.88 MIL/uL (ref 4.22–5.81)
RDW: 16.1 % — ABNORMAL HIGH (ref 11.5–15.5)
WBC: 8.7 10*3/uL (ref 4.0–10.5)
nRBC: 0 % (ref 0.0–0.2)

## 2020-10-03 LAB — COOXEMETRY PANEL
Carboxyhemoglobin: 1.3 % (ref 0.5–1.5)
Methemoglobin: 0.7 % (ref 0.0–1.5)
O2 Saturation: 54.6 %
Total hemoglobin: 14.4 g/dL (ref 12.0–16.0)

## 2020-10-03 LAB — GLUCOSE, CAPILLARY
Glucose-Capillary: 183 mg/dL — ABNORMAL HIGH (ref 70–99)
Glucose-Capillary: 190 mg/dL — ABNORMAL HIGH (ref 70–99)
Glucose-Capillary: 128 mg/dL — ABNORMAL HIGH (ref 70–99)
Glucose-Capillary: 158 mg/dL — ABNORMAL HIGH (ref 70–99)

## 2020-10-03 LAB — BASIC METABOLIC PANEL
Anion gap: 10 (ref 5–15)
Anion gap: 15 (ref 5–15)
BUN: 25 mg/dL — ABNORMAL HIGH (ref 8–23)
BUN: 30 mg/dL — ABNORMAL HIGH (ref 8–23)
CO2: 30 mmol/L (ref 22–32)
CO2: 28 mmol/L (ref 22–32)
Calcium: 8.8 mg/dL — ABNORMAL LOW (ref 8.9–10.3)
Calcium: 9 mg/dL (ref 8.9–10.3)
Chloride: 89 mmol/L — ABNORMAL LOW (ref 98–111)
Chloride: 86 mmol/L — ABNORMAL LOW (ref 98–111)
Creatinine, Ser: 1.05 mg/dL (ref 0.61–1.24)
Creatinine, Ser: 1.4 mg/dL — ABNORMAL HIGH (ref 0.61–1.24)
GFR, Estimated: >60 mL/min (ref >60)
GFR, Estimated: 53 mL/min — ABNORMAL LOW (ref >60)
Glucose, Bld: 329 mg/dL — ABNORMAL HIGH (ref 70–99)
Glucose, Bld: 296 mg/dL — ABNORMAL HIGH (ref 70–99)
Potassium: 3 mmol/L — ABNORMAL LOW (ref 3.5–5.1)
Potassium: 3 mmol/L — ABNORMAL LOW (ref 3.5–5.1)
Sodium: 129 mmol/L — ABNORMAL LOW (ref 135–145)
Sodium: 129 mmol/L — ABNORMAL LOW (ref 135–145)

## 2020-10-03 LAB — MAGNESIUM: Magnesium: 2.2 mg/dL (ref 1.7–2.4)

## 2020-10-03 SURGERY — PACEMAKER IMPLANT
Anesthesia: LOCAL

## 2020-10-03 MED ORDER — POTASSIUM CHLORIDE CRYS ER 20 MEQ PO TBCR: 40.0000 meq | EXTENDED_RELEASE_TABLET | ORAL | Status: DC

## 2020-10-03 MED ORDER — POTASSIUM CHLORIDE CRYS ER 20 MEQ PO TBCR
40.0000 meq | EXTENDED_RELEASE_TABLET | ORAL | Status: AC
  Administered 2020-10-03 (×2): 40 meq via ORAL
  Filled 2020-10-03 (×2): qty 2

## 2020-10-03 MED ORDER — POTASSIUM CHLORIDE 10 MEQ/50ML IV SOLN
10.0000 meq | INTRAVENOUS | Status: AC
  Administered 2020-10-03 – 2020-10-04 (×3): 10 meq via INTRAVENOUS
  Filled 2020-10-03 (×3): qty 50

## 2020-10-03 MED ORDER — METOLAZONE 2.5 MG PO TABS
2.5000 mg | ORAL_TABLET | Freq: Once | ORAL | Status: AC
  Administered 2020-10-03: 2.5 mg via ORAL
  Filled 2020-10-03: qty 1

## 2020-10-03 MED ORDER — POTASSIUM CHLORIDE CRYS ER 20 MEQ PO TBCR
40.0000 meq | EXTENDED_RELEASE_TABLET | ORAL | Status: AC
  Administered 2020-10-03 – 2020-10-04 (×3): 40 meq via ORAL
  Filled 2020-10-03 (×3): qty 2

## 2020-10-03 MED ORDER — POTASSIUM CHLORIDE 10 MEQ/50ML IV SOLN
10.0000 meq | INTRAVENOUS | Status: AC
  Administered 2020-10-03 (×3): 10 meq via INTRAVENOUS
  Filled 2020-10-03 (×3): qty 50

## 2020-10-03 NOTE — Progress Notes (Signed)
Pt went into a coughing spell with medication administration,  Pt states "I have been doing this a lot lately" and that coughing spells with swallowing are progressively becoming worse. He states he has no problem swallowing pills at home and that this is new for him  Swallow eval ordered

## 2020-10-03 NOTE — Progress Notes (Signed)
Patient ID: Chad Wilkerson, male   DOB: [birth year], 75 y.o.   MRN: [id number]     Advanced Heart Failure Rounding Note  PCP-Cardiologist: Christell Constant, MD   Subjective:    Good diuresis again yesterday after getting metolazone 2.5 x 1.  Creatinine stable.  SBP 90s-110s.  Co-ox 55% on milrinone 0.25.  He remains on Lasix gtt 15 mg/hr.  CVP 13 today.    VF with ICD shock on 4/22.  Tikosyn stopped and amiodarone gtt started.  No further VT/VF but still with PVCs.     Objective:   Weight Range: 101.9 kg Body mass index is 33.65 kg/m.   Vital Signs:   Temp:  [97.7 F (36.5 C)-98.6 F (37 C)] 97.8 F (36.6 C) (04/24 0700) Pulse Rate:  [65-78] 65 (04/24 0700) Resp:  [16-20] 16 (04/24 0700) BP: (91-110)/(61-74) 91/65 (04/24 0700) SpO2:  [91 %-97 %] 96 % (04/24 0700) Weight:  [101.9 kg] 101.9 kg (04/24 0201) Last BM Date: 10/02/20  Weight change: Filed Weights   10/01/20 0456 10/02/20 0420 10/03/20 0201  Weight: 103 kg 102.1 kg 101.9 kg    Intake/Output:   Intake/Output Summary (Last 24 hours) at 10/03/2020 0956 Last data filed at 10/03/2020 0933 Gross per 24 hour  Intake 2539.69 ml  Output 4725 ml  Net -2185.31 ml      Physical Exam    General: NAD Neck: JVP 14 cm, no thyromegaly or thyroid nodule.  Lungs: Clear to auscultation bilaterally with normal respiratory effort. CV: Lateral PMI.  Heart regular S1/S2, no S3/S4, 1/6 SEM RUSB.  No peripheral edema.  Abdomen: Soft, nontender, no hepatosplenomegaly, mild distention.  Skin: Intact without lesions or rashes.  Neurologic: Alert and oriented x 3.  Psych: Normal affect. Extremities: No clubbing or cyanosis.  HEENT: Normal.    Telemetry   NSR with 1st degree AVB, occasional PVCs (personally reviewed)   Labs    CBC Recent Labs    10/02/20 0447 10/03/20 0523  WBC 9.5 8.7  HGB 13.8 14.2  HCT 41.6 42.5  MCV 87.6 87.1  PLT 253 267   Basic Metabolic Panel Recent Labs    86/76/72 1443  10/01/20 2216 10/02/20 0447 10/02/20 1642 10/03/20 0523  NA 135   < > 131* 132* 129*  K 2.6*   < > 3.2* 3.5 3.0*  CL 89*   < > 90* 92* 89*  CO2 33*   < > 32 30 30  GLUCOSE 187*   < > 234* 182* 329*  BUN 14   < > 19 24* 25*  CREATININE 0.94   < > 0.93 1.16 1.05  CALCIUM 9.3   < > 8.9 9.2 8.8*  MG 2.2  --  2.1  --   --    < > = values in this interval not displayed.   Liver Function Tests No results for input(s): AST, ALT, ALKPHOS, BILITOT, PROT, ALBUMIN in the last 72 hours. No results for input(s): LIPASE, AMYLASE in the last 72 hours. Cardiac Enzymes No results for input(s): CKTOTAL, CKMB, CKMBINDEX, TROPONINI in the last 72 hours.  BNP: BNP (last 3 results) Recent Labs    09/28/20 1613  BNP 3,328.4*    ProBNP (last 3 results) Recent Labs    04/01/20 1509  PROBNP 5,001*     D-Dimer No results for input(s): DDIMER in the last 72 hours. Hemoglobin A1C No results for input(s): HGBA1C in the last 72 hours. Fasting Lipid Panel No results for input(s): CHOL,  HDL, LDLCALC, TRIG, CHOLHDL, LDLDIRECT in the last 72 hours. Thyroid Function Tests No results for input(s): TSH, T4TOTAL, T3FREE, THYROIDAB in the last 72 hours.  Invalid input(s): FREET3  Other results:   Imaging    No results found.   Medications:     Scheduled Medications: . apixaban  5 mg Oral BID  . atorvastatin  20 mg Oral Daily  . Chlorhexidine Gluconate Cloth  6 each Topical Daily  . dapagliflozin propanediol  10 mg Oral Daily  . digoxin  0.125 mg Oral Daily  . docusate sodium  100 mg Oral BID  . escitalopram  10 mg Oral Daily  . insulin aspart  0-9 Units Subcutaneous TID WC  . losartan  12.5 mg Oral Daily  . metolazone  2.5 mg Oral Once  . mometasone-formoterol  2 puff Inhalation BID  . polyethylene glycol  17 g Oral BID  . potassium chloride  40 mEq Oral Q2H  . sodium chloride flush  10-40 mL Intracatheter Q12H  . sodium chloride flush  3 mL Intravenous Q12H  . spironolactone  25  mg Oral Daily    Infusions: . sodium chloride    . amiodarone 30 mg/hr (10/03/20 0052)  . furosemide (LASIX) 200 mg in dextrose 5% 100 mL (2mg /mL) infusion 15 mg/hr (10/03/20 0933)  . milrinone 0.25 mcg/kg/min (10/03/20 0737)  . potassium chloride      PRN Medications: acetaminophen **OR** acetaminophen, ondansetron **OR** ondansetron (ZOFRAN) IV, polyethylene glycol, sodium chloride flush   Assessment/Plan   1. Acute on chronic systolic CHF: Long-standing NICM, followed in the past in [place].  Has MDT ICD.  Echo 09/28/20 with EF <20%, severe LV dilation, mild RV dilation with moderate RV dysfunction, mild-moderate MR, moderate-severe TR, severe biatrial enlargement, no more than moderate AS.  Last cath in 2/22 showed nonobstructive mild CAD, elevated right and left heart filling pressures with low CI 2.04; AS no more than moderate.  Cause of cardiomyopathy uncertain, he is generally in NSR so unlikely to be tachy-mediated CMP.  Possible remote viral infection/viral myocarditis.  NYHA class IV at admission, volume overloaded on exam with CVP 15-16 currently (CVP trending down and weight down).  Co-ox 55% on milrinone 0.25.  Low output HF.  He is on Lasix gtt 15 mg/hr + metolazone 2.5 with good diuresis yesterday again.  - Increase milrinone back to 0.375 with marginal co-ox.   - Continue Lasix gtt 15 mg/hr and will give metolazone 2.5 mg x 1 again today.  - Continue digoxin 0.125 daily - Aggressive K repletion and repeat BMET in pm, discussed with pharmacy.  Follow Mg.  - Continue Farxiga and spironolactone 25 mg daily.  - Continue losartan 12.5 mg daily.  - Long-term, concerned about his trajectory.  Has been doing poorly at home.  Suspect low output HF, would like to fully diurese him, then try to wean milrinone and repeat RHC (possibly Tuesday).  He may be a candidate for LVAD placement. Creatinine good, I do have concern about his RV (moderate dysfunction). Will ask LVAD nurse  coordinator to see him Monday.   2. Atrial fibrillation: Paroxysmal.  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 Off Tikosyn after VF on 4/22.  Did not tolerate amiodarone in the past but doing well on it so far.  - Continue amiodarone gtt - Continue Eliquis.  - Replace K and Mg.  3. Aortic stenosis: Based on full evaluation (cath and echo), suspect no more than moderate aortic stenosis.  I  do not think he would benefit from TAVR at this point.  4. 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  - statin.  5. VF: VF terminated by ICD shock on 4/22.  Milrinone decreased to 0.25.  Tikosyn stopped and amiodarone gtt started.  - Continue amiodarone gtt.  - Replace K aggressive and repeat BMET in pm.   Mobilize with cardiac rehab.   Length of Stay: 5  [name], MD  10/03/2020, 9:56 AM  Advanced Heart Failure Team Pager [phone] (M-F; 7a - 5p)  Please contact [facility] for night-coverage after hours (5p -7a ) and weekends on [email or web address]

## 2020-10-03 NOTE — Progress Notes (Signed)
PROGRESS NOTE    [name]  LEX:[id number] DOB: [birth year] DOA: 09/28/2020 PCP: [name], NP    Chief Complaint  Patient presents with  . Shortness of Breath   Brief Narrative:  Chad Wilkerson is Chad Wilkerson 75 y.o. male with medical history significant for chronic combined systolic and diastolic CHF (EF less than 20%, G3 DD by TTE 09/28/2020), NICM, s/p ICD, atrial fibrillation on Eliquis and Tikosyn, T2DM, HTN, and OSA on BiPAP who presented to the ED for evaluation of shortness of breath and weightgain.  Patient reports 1 week of progressive dyspnea, DOE, orthopnea weight gain.  He was seen in follow-up in his cardiology office earlier today.  He underwent echocardiogram which showed EF <20%.  He had significant shortness of breath therefore was sent to the ED for further management.  He's been admitted with HF exacerbation.  Assessment & Plan:   Principal Problem:   Acute on chronic combined systolic (congestive) and diastolic (congestive) heart failure (HCC) Active Problems:   Sleep apnea   Paroxysmal atrial fibrillation (HCC)   ICD (implantable cardioverter-defibrillator) in place  Chad Wilkerson is [name] 75 y.o. male with medical history significant for chronic combined systolic and diastolic CHF (EF less than 20%, G3 DD by TTE 09/28/2020), NICM, s/p ICD, atrial fibrillation on Eliquis and Tikosyn, T2DM, HTN, and OSA on BiPAP who is admitted with acute on chronic combined systolic and diastolic CHF.  Ventricular Fibrillation  - run of vfib this PM s/p shock from ICD - continue amiodarone per cards.  Continue milrinone for now. - holding tikosyn at this time - appreciate cards assistance  Acute on chronic combined systolic and diastolic CHF s/p ICD: TTE 4/19 prior to admit showed EF <20% with G3 DD. Also moderately reduced RVSF.   Cardiology following and managing diuresis.  At risk for low output heart failure and therefore started on milrinone. -Cardiology  following - appreciate assistance -> milrinone per cards, lasix gtt per cards, farxiga, spironolactone increased to 25 mg daily, losartan 12.5 mg daily - adding digoxin - metolazone again per cards - possibly repeat RHC after diuresed (?Tuesday), possible LVAD candidate?  LVAD nurse coordinator to see on Monday. -Monitor strict I/O's and daily weights  Hypokalemia Replace aggressively and follow  Hypotension: Holding home Coreg.  On milrinone as above.  IV lasix.  Spironolactone.  Losartan  Paroxysmal atrial fibrillation: Continue Eliquis and amiodarone.  Holding Coreg.  Type 2 diabetes: Hold home metformin.  Continue farxiga.  SSI.   OSA: Continue BiPAP nightly.  DVT prophylaxis: eliquis Code Status: DNR Family Communication: none at bedside Disposition:   Status is: Inpatient  Remains inpatient appropriate because:Inpatient level of care appropriate due to severity of illness   Dispo: The patient is from: home              Anticipated d/c is to: pending              Patient currently is not medically stable to d/c.   Difficult to place patient No       Consultants:   cardiology  Procedures: Echo IMPRESSIONS    1. Left ventricular ejection fraction, by estimation, is <20%. The left  ventricle has severely decreased function. The left ventricle demonstrates  global hypokinesis. The left ventricular internal cavity size was severely  dilated. Left ventricular  diastolic parameters are consistent with Grade III diastolic dysfunction  (restrictive).  2. Right ventricular systolic function is moderately reduced. The right  ventricular size is  mildly enlarged. There is normal pulmonary artery  systolic pressure.  3. Left atrial size was severely dilated.  4. Right atrial size was severely dilated.  5. The mitral valve is grossly normal. Mild to moderate mitral valve  regurgitation.  6. Tricuspid valve regurgitation is moderate to severe.  7. The  aortic valve is grossly normal. Aortic valve regurgitation is not  visualized.  8. Aortic dilatation noted. There is mild dilatation of the ascending  aorta, measuring 41 mm.  Antimicrobials: Anti-infectives (From admission, onward)   None         Subjective: Sleeping, will follow up later  Objective: Vitals:   10/03/20 0339 10/03/20 0700 10/03/20 1309 10/03/20 1700  BP: 110/74 91/65 100/72 98/66  Pulse: 72 65 71 74  Resp: 18 16 15 20   Temp: 97.8 F (36.6 C) 97.8 F (36.6 C) 97.9 F (36.6 C) 98.6 F (37 C)  TempSrc: Oral Oral Oral Oral  SpO2: 97% 96% 95% 95%  Weight:      Height:        Intake/Output Summary (Last 24 hours) at 10/03/2020 1758 Last data filed at 10/03/2020 1747 Gross per 24 hour  Intake 1809.04 ml  Output 5975 ml  Net -4165.96 ml   Filed Weights   10/01/20 0456 10/02/20 0420 10/03/20 0201  Weight: 103 kg 102.1 kg 101.9 kg    Examination:  General: No acute distress. Cardiovascular: Heart sounds show Kloi Brodman regular rate, and rhythm Lungs: Clear to auscultation bilaterally  Abdomen: Soft, nontender, nondistended Neurological: Alert and oriented 3. Moves all extremities 4  Cranial nerves II through XII grossly intact. Skin: Warm and dry. No rashes or lesions. Extremities: No clubbing or cyanosis. No edema.    Data Reviewed: I have personally reviewed following labs and imaging studies  CBC: Recent Labs  Lab 09/28/20 1612 09/29/20 0040 09/29/20 1419 09/30/20 0429 10/01/20 0450 10/02/20 0447 10/03/20 0523  WBC 8.9   < > 8.2 9.0 9.3 9.5 8.7  NEUTROABS 6.8  --   --   --   --   --   --   HGB 15.0   < > 13.2 13.9 14.2 13.8 14.2  HCT 45.8   < > 40.1 41.7 43.1 41.6 42.5  MCV 89.3   < > 88.7 87.8 88.5 87.6 87.1  PLT 257   < > 200 213 231 253 267   < > = values in this interval not displayed.    Basic Metabolic Panel: Recent Labs  Lab 09/29/20 1419 09/30/20 0429 09/30/20 1730 10/01/20 0450 10/01/20 1443 10/01/20 2216 10/02/20 0447  10/02/20 1642 10/03/20 0523  NA  --  137   < > 136 135 131* 131* 132* 129*  K  --  2.8*   < > 3.3* 2.6* 3.4* 3.2* 3.5 3.0*  CL  --  100   < > 96* 89* 89* 90* 92* 89*  CO2  --  26   < > 31 33* 33* 32 30 30  GLUCOSE  --  136*   < > 145* 187* 201* 234* 182* 329*  BUN  --  12   < > 14 14 18 19  24* 25*  CREATININE  --  0.88   < > 0.98 0.94 0.99 0.93 1.16 1.05  CALCIUM  --  8.4*   < > 9.1 9.3 8.9 8.9 9.2 8.8*  MG 2.0 2.0  --  2.2 2.2  --  2.1  --   --    < > = values  in this interval not displayed.    GFR: Estimated Creatinine Clearance: 72 mL/min (by C-G formula based on SCr of 1.05 mg/dL).  Liver Function Tests: Recent Labs  Lab 09/28/20 1612  AST 33  ALT 25  ALKPHOS 77  BILITOT 1.5*  PROT 6.9  ALBUMIN 3.6    CBG: Recent Labs  Lab 10/02/20 1639 10/02/20 2103 10/03/20 0603 10/03/20 1159 10/03/20 1632  GLUCAP 213* 239* 183* 190* 128*     Recent Results (from the past 240 hour(s))  Resp Panel by RT-PCR (Flu Mose Colaizzi&B, Covid) Nasopharyngeal Swab     Status: None   Collection Time: 09/28/20  4:13 PM   Specimen: Nasopharyngeal Swab; Nasopharyngeal(NP) swabs in vial transport medium  Result Value Ref Range Status   SARS Coronavirus 2 by RT PCR NEGATIVE NEGATIVE Final    Comment: (NOTE) SARS-CoV-2 target nucleic acids are NOT DETECTED.  The SARS-CoV-2 RNA is generally detectable in upper respiratory specimens during the acute phase of infection. The lowest concentration of SARS-CoV-2 viral copies this assay can detect is 138 copies/mL. Letonya Mangels negative result does not preclude SARS-Cov-2 infection and should not be used as the sole basis for treatment or other patient management decisions. Aubrionna Istre negative result may occur with  improper specimen collection/handling, submission of specimen other than nasopharyngeal swab, presence of viral mutation(s) within the areas targeted by this assay, and inadequate number of viral copies(<138 copies/mL). Levent Kornegay negative result must be combined  with clinical observations, patient history, and epidemiological information. The expected result is Negative.  Fact Sheet for Patients:  BloggerCourse.com  Fact Sheet for Healthcare Providers:  SeriousBroker.it  This test is no t yet approved or cleared by the [place] FDA and  has been authorized for detection and/or diagnosis of SARS-CoV-2 by FDA under an Emergency Use Authorization (EUA). This EUA will remain  in effect (meaning this test can be used) for the duration of the COVID-19 declaration under Section 564(b)(1) of the Act, 21 U.S.C.section 360bbb-3(b)(1), unless the authorization is terminated  or revoked sooner.       Influenza Jennifr Gaeta by PCR NEGATIVE NEGATIVE Final   Influenza B by PCR NEGATIVE NEGATIVE Final    Comment: (NOTE) The Xpert Xpress SARS-CoV-2/FLU/RSV plus assay is intended as an aid in the diagnosis of influenza from Nasopharyngeal swab specimens and should not be used as Soham Hollett sole basis for treatment. Nasal washings and aspirates are unacceptable for Xpert Xpress SARS-CoV-2/FLU/RSV testing.  Fact Sheet for Patients: BloggerCourse.com  Fact Sheet for Healthcare Providers: SeriousBroker.it  This test is not yet approved or cleared by the [place] FDA and has been authorized for detection and/or diagnosis of SARS-CoV-2 by FDA under an Emergency Use Authorization (EUA). This EUA will remain in effect (meaning this test can be used) for the duration of the COVID-19 declaration under Section 564(b)(1) of the Act, 21 U.S.C. section 360bbb-3(b)(1), unless the authorization is terminated or revoked.  Performed at [facility], [address], [place], [place] [address]          Radiology Studies: No results found.      Scheduled Meds: . apixaban  5 mg Oral BID  . atorvastatin  20 mg Oral Daily  . Chlorhexidine Gluconate Cloth  6 each  Topical Daily  . dapagliflozin propanediol  10 mg Oral Daily  . digoxin  0.125 mg Oral Daily  . docusate sodium  100 mg Oral BID  . escitalopram  10 mg Oral Daily  . insulin aspart  0-9 Units  Subcutaneous TID WC  . losartan  12.5 mg Oral Daily  . mometasone-formoterol  2 puff Inhalation BID  . polyethylene glycol  17 g Oral BID  . sodium chloride flush  10-40 mL Intracatheter Q12H  . sodium chloride flush  3 mL Intravenous Q12H  . spironolactone  25 mg Oral Daily   Continuous Infusions: . sodium chloride    . amiodarone 30 mg/hr (10/03/20 1302)  . furosemide (LASIX) 200 mg in dextrose 5% 100 mL (2mg /mL) infusion 15 mg/hr (10/03/20 0933)  . milrinone 0.375 mcg/kg/min (10/03/20 1651)     LOS: 5 days    Time spent: over 30 min    10/05/20, MD [facility]   To contact the attending provider between 7A-7P or the covering provider during after hours 7P-7A, please log into the web site [email or web address] and access using universal [place] password for that web site. If you do not have the password, please call the hospital operator.  10/03/2020, 5:58 PM

## 2020-10-03 NOTE — Progress Notes (Signed)
Pt has a home CPAP.  No assistance needed.

## 2020-10-03 NOTE — Telephone Encounter (Signed)
Called patient's partner [name]) to discussed echo and and results. - Discussed milrinone indications and limitations long term - discussed why metformin is not used in patient - discussed concerns about needing significant inotropes through case- - discussed that patient has always had a focus on quality of life.   Will see how RHC proceeds use this to guide medium and long term prognosis.  Historically, unable to reach patient by phone for results given substantial hearing deficit.  He has had asked me to call his partner instead.  Christell Constant, MD

## 2020-10-03 NOTE — Progress Notes (Signed)
Progress Note  Patient Name: Chad Wilkerson Date of Encounter: 10/03/2020  [facility] Cardiologist: Werner Lean, MD   Subjective   NAEO.  HF planning RHC early week to reassess output.  Inpatient Medications    Scheduled Meds: . apixaban  5 mg Oral BID  . atorvastatin  20 mg Oral Daily  . Chlorhexidine Gluconate Cloth  6 each Topical Daily  . dapagliflozin propanediol  10 mg Oral Daily  . digoxin  0.125 mg Oral Daily  . docusate sodium  100 mg Oral BID  . escitalopram  10 mg Oral Daily  . insulin aspart  0-9 Units Subcutaneous TID WC  . losartan  12.5 mg Oral Daily  . mometasone-formoterol  2 puff Inhalation BID  . polyethylene glycol  17 g Oral BID  . sodium chloride flush  10-40 mL Intracatheter Q12H  . sodium chloride flush  3 mL Intravenous Q12H  . spironolactone  25 mg Oral Daily   Continuous Infusions: . sodium chloride    . amiodarone 30 mg/hr (10/03/20 0052)  . furosemide (LASIX) 200 mg in dextrose 5% 100 mL (109m/mL) infusion 15 mg/hr (10/02/20 2026)  . milrinone 0.25 mcg/kg/min (10/02/20 0644)   PRN Meds: acetaminophen **OR** acetaminophen, ondansetron **OR** ondansetron (ZOFRAN) IV, polyethylene glycol, sodium chloride flush   Vital Signs    Vitals:   10/02/20 1948 10/02/20 2328 10/03/20 0201 10/03/20 0339  BP:  99/70  110/74  Pulse:  78  72  Resp:  18  18  Temp:  98.6 F (37 C)  97.8 F (36.6 C)  TempSrc:  Oral  Oral  SpO2: 94% 94%  97%  Weight:   101.9 kg   Height:        Intake/Output Summary (Last 24 hours) at 10/03/2020 0731 Last data filed at 10/03/2020 0604 Gross per 24 hour  Intake 1699.69 ml  Output 4400 ml  Net -2700.31 ml   Last 3 Weights 10/03/2020 10/02/2020 10/01/2020  Weight (lbs) 224 lb 9.6 oz 225 lb 1.6 oz 227 lb  Weight (kg) 101.878 kg 102.105 kg 102.967 kg      Telemetry    NSVT. No sustained arrhythmias. No further VF episodes. - Personally Reviewed  ECG    n/a - Personally Reviewed  Physical Exam    GEN: No acute distress.  Chronically ill appearing. Neck: No JVD Cardiac: RRR, no murmurs, rubs, or gallops. Warm extremities. No edema. Respiratory: Clear to auscultation bilaterally. GI: Soft, nontender, non-distended  MS: No edema; No deformity. Neuro:  Nonfocal  Psych: Normal affect   Labs    High Sensitivity Troponin:   Recent Labs  Lab 09/28/20 1612 09/28/20 1812  TROPONINIHS 26* 28*      Chemistry Recent Labs  Lab 09/28/20 1612 09/29/20 0040 10/02/20 0447 10/02/20 1642 10/03/20 0523  NA 133*   < > 131* 132* 129*  K 3.7   < > 3.2* 3.5 3.0*  CL 98   < > 90* 92* 89*  CO2 26   < > 32 30 30  GLUCOSE 131*   < > 234* 182* 329*  BUN 20   < > 19 24* 25*  CREATININE 1.03   < > 0.93 1.16 1.05  CALCIUM 8.9   < > 8.9 9.2 8.8*  PROT 6.9  --   --   --   --   ALBUMIN 3.6  --   --   --   --   AST 33  --   --   --   --  ALT 25  --   --   --   --   ALKPHOS 77  --   --   --   --   BILITOT 1.5*  --   --   --   --   GFRNONAA >60   < > >60 >60 >60  ANIONGAP 9   < > _0 < > = values in this interval not displayed.     Hematology Recent Labs  Lab 10/01/20 0450 10/02/20 0447 10/03/20 0523  WBC 9.3 9.5 8.7  RBC 4.87 4.75 4.88  HGB 14.2 13.8 14.2  HCT 43.1 41.6 42.5  MCV 88.5 87.6 87.1  MCH 29.2 29.1 29.1  MCHC 32.9 33.2 33.4  RDW 16.3* 16.1* 16.1*  PLT 231 253 267    BNP Recent Labs  Lab 09/28/20 1613  BNP 3,328.4*     DDimer No results for input(s): DDIMER in the last 168 hours.   Radiology    No results found.  Cardiac Studies   No new   Assessment & Plan    74yo pleasant man with severe NICM s/p ICD implant 12/04/2016 who I previously met to initiate dofetilide for his paroxysmal symptomatic AF is now admitted with decompensated HF complicated by ventricular arrhythmias including VF event requiring ICD shock. Patient has been transitioned to amiodarone from dofetilide and initiated on milrinone for hemodynamic support.   #VT/VF No further  sustained episodes overnight. Continue amiodarone IV for now. Will plan to transition to oral tomorrow (Monday) Keep K>4, Mg>2  #pAF Amiodarone eliquis  #Acutely decompensated chronic systolic HF 2/2 NICM Seems to be improving. On milrinone due to low CO Management per HF team  #Goals of Care Discussions ongoing  For questions or updates, please contact [place] Please consult [email or web address] for contact info under        Signed, Vickie Epley, MD  10/03/2020, 7:31 AM

## 2020-10-03 NOTE — Evaluation (Signed)
Clinical/Bedside Swallow Evaluation Patient Details  Name: Chad Wilkerson MRN: [id number] Date of Birth: 07/11/1945  Today's Date: 10/03/2020 Time: SLP Start Time (ACUTE ONLY): 1608 SLP Stop Time (ACUTE ONLY): 1626 SLP Time Calculation (min) (ACUTE ONLY): 18.73 min  Past Medical History:  Past Medical History:  Diagnosis Date  . Adjustment disorder   . Atrial fibrillation (HCC)   . Benign colon polyp   . CHF (congestive heart failure) (HCC)   . Diabetes mellitus without complication (HCC)   . Diverticulitis   . Essential hypertension   . Heart disease   . Hypogonadism male   . NICM (nonischemic cardiomyopathy) (HCC) 07/20/2020  . Sleep apnea   . Urine incontinence   . Vitamin D deficiency    Past Surgical History:  Past Surgical History:  Procedure Laterality Date  . CHOLECYSTECTOMY    . RIGHT/LEFT HEART CATH AND CORONARY ANGIOGRAPHY N/A 07/19/2020   Procedure: RIGHT/LEFT HEART CATH AND CORONARY ANGIOGRAPHY;  Surgeon: Kathleene Hazel, MD;  Location: [facility];  Service: Cardiovascular;  Laterality: N/A;  . TONSILLECTOMY     HPI:  Pt is a 75 y.o. male with medical history significant for chronic combined systolic and diastolic CHF (EF less than 20%, G3 DD by TTE 09/28/2020), NICM, s/p ICD, atrial fibrillation on Eliquis and Tikosyn, DM2, HTN, and OSA on BiPAP. Pt presented to the ED for evaluation of shortness of breath and weight gain. CXR 4/19: Cardiomegaly with mild interstitial pulmonary edema. SLP consulted secondary to "coughing spell with medication administration" and pt's report to RN of frequent coughing with swallowing since admission.   Assessment / Plan / Recommendation Clinical Impression  Pt was seen for bedside swallow evaluation. Pt reported that he has been trained to eat quickly from being in boarding school for eight years and that he notices coughing occasionally during meals when he eats quickly or takes too large of a bite. Pt also stated that  he typically takes 30 pills at once with liquid, but has had to reduce this to 4-5. However, per the pt, he still inconsistently has coughing. Pt denied any signs of aspiration during meals since admission. Oral mechanism exam was WNL and dentition was adequate. He tolerated all solids and liquids without signs or symptoms of oropharyngeal dysphagia. A regular texture diet with thin liquids is recommended at this time with observance of swallowing precautions. SLP will see pt once more, but it is anticipated that further SLP services may not be needed beyond that point. SLP Visit Diagnosis: Dysphagia, unspecified (R13.10)    Aspiration Risk  Mild aspiration risk    Diet Recommendation Regular;Thin liquid   Liquid Administration via: Cup;Straw Medication Administration: Whole meds with puree Supervision: Intermittent supervision to cue for compensatory strategies Compensations: Slow rate;Small sips/bites Postural Changes: Seated upright at 90 degrees    Other  Recommendations Oral Care Recommendations: Oral care BID;Patient independent with oral care   Follow up Recommendations  (TBD)      Frequency and Duration min 1 x/week  1 week       Prognosis Prognosis for Safe Diet Advancement: Good      Swallow Study   General Date of Onset: 10/03/20 HPI: Pt is a 75 y.o. male with medical history significant for chronic combined systolic and diastolic CHF (EF less than 20%, G3 DD by TTE 09/28/2020), NICM, s/p ICD, atrial fibrillation on Eliquis and Tikosyn, DM2, HTN, and OSA on BiPAP. Pt presented to the ED for evaluation of shortness of breath  and weight gain. CXR 4/19: Cardiomegaly with mild interstitial pulmonary edema. SLP consulted secondary to "coughing spell with medication administration" and pt's report to RN of frequent coughing with swallowing since admission. Type of Study: Bedside Swallow Evaluation Previous Swallow Assessment: None Diet Prior to this Study: Regular;Thin  liquids Temperature Spikes Noted: No Respiratory Status: Room air History of Recent Intubation: No Behavior/Cognition: Alert;Cooperative;Pleasant mood Oral Cavity Assessment: Within Functional Limits Oral Care Completed by SLP: No Oral Cavity - Dentition: Adequate natural dentition Vision: Functional for self-feeding Self-Feeding Abilities: Needs assist Patient Positioning: Upright in bed;Postural control adequate for testing Baseline Vocal Quality: Normal Volitional Cough: Strong Volitional Swallow: Able to elicit    Oral/Motor/Sensory Function Overall Oral Motor/Sensory Function: Within functional limits   Ice Chips Ice chips: Within functional limits Presentation: Spoon   Thin Liquid Thin Liquid: Within functional limits Presentation: Straw    Nectar Thick Nectar Thick Liquid: Not tested   Honey Thick Honey Thick Liquid: Not tested   Puree Puree: Within functional limits Presentation: Spoon   Solid     Solid: Within functional limits Presentation: Self Fed     Chad Wilkerson. Vear Clock, MS, CCC-SLP Acute Rehabilitation Services Office number [phone] Pager [phone]  Chad Wilkerson 10/03/2020,4:41 PM

## 2020-10-04 DIAGNOSIS — I350 Nonrheumatic aortic (valve) stenosis: Secondary | ICD-10-CM | POA: Diagnosis not present

## 2020-10-04 DIAGNOSIS — I5043 Acute on chronic combined systolic (congestive) and diastolic (congestive) heart failure: Secondary | ICD-10-CM | POA: Diagnosis not present

## 2020-10-04 DIAGNOSIS — I480 Paroxysmal atrial fibrillation: Secondary | ICD-10-CM | POA: Diagnosis not present

## 2020-10-04 DIAGNOSIS — I5023 Acute on chronic systolic (congestive) heart failure: Secondary | ICD-10-CM | POA: Diagnosis not present

## 2020-10-04 LAB — BASIC METABOLIC PANEL
Anion gap: 11 (ref 5–15)
Anion gap: 15 (ref 5–15)
Anion gap: 11 (ref 5–15)
BUN: 28 mg/dL — ABNORMAL HIGH (ref 8–23)
BUN: 31 mg/dL — ABNORMAL HIGH (ref 8–23)
BUN: 32 mg/dL — ABNORMAL HIGH (ref 8–23)
CO2: 29 mmol/L (ref 22–32)
CO2: 31 mmol/L (ref 22–32)
CO2: 29 mmol/L (ref 22–32)
Calcium: 8.7 mg/dL — ABNORMAL LOW (ref 8.9–10.3)
Calcium: 9.3 mg/dL (ref 8.9–10.3)
Calcium: 9 mg/dL (ref 8.9–10.3)
Chloride: 84 mmol/L — ABNORMAL LOW (ref 98–111)
Chloride: 85 mmol/L — ABNORMAL LOW (ref 98–111)
Chloride: 88 mmol/L — ABNORMAL LOW (ref 98–111)
Creatinine, Ser: 1.15 mg/dL (ref 0.61–1.24)
Creatinine, Ser: 1.22 mg/dL (ref 0.61–1.24)
Creatinine, Ser: 1.14 mg/dL (ref 0.61–1.24)
GFR, Estimated: >60 mL/min (ref >60)
GFR, Estimated: >60 mL/min (ref >60)
GFR, Estimated: >60 mL/min (ref >60)
Glucose, Bld: 378 mg/dL — ABNORMAL HIGH (ref 70–99)
Glucose, Bld: 194 mg/dL — ABNORMAL HIGH (ref 70–99)
Glucose, Bld: 287 mg/dL — ABNORMAL HIGH (ref 70–99)
Potassium: 3 mmol/L — ABNORMAL LOW (ref 3.5–5.1)
Potassium: 3.3 mmol/L — ABNORMAL LOW (ref 3.5–5.1)
Potassium: 2.9 mmol/L — ABNORMAL LOW (ref 3.5–5.1)
Sodium: 124 mmol/L — ABNORMAL LOW (ref 135–145)
Sodium: 131 mmol/L — ABNORMAL LOW (ref 135–145)
Sodium: 128 mmol/L — ABNORMAL LOW (ref 135–145)

## 2020-10-04 LAB — COOXEMETRY PANEL
Carboxyhemoglobin: 1.4 % (ref 0.5–1.5)
Methemoglobin: 0.9 % (ref 0.0–1.5)
O2 Saturation: 60 %
Total hemoglobin: 13.6 g/dL (ref 12.0–16.0)

## 2020-10-04 LAB — CBC
HCT: 40.6 % (ref 39.0–52.0)
Hemoglobin: 13.3 g/dL (ref 13.0–17.0)
MCH: 28.5 pg (ref 26.0–34.0)
MCHC: 32.8 g/dL (ref 30.0–36.0)
MCV: 86.9 fL (ref 80.0–100.0)
Platelets: 246 10*3/uL (ref 150–400)
RBC: 4.67 MIL/uL (ref 4.22–5.81)
RDW: 15.8 % — ABNORMAL HIGH (ref 11.5–15.5)
WBC: 7.5 10*3/uL (ref 4.0–10.5)
nRBC: 0 % (ref 0.0–0.2)

## 2020-10-04 LAB — GLUCOSE, CAPILLARY
Glucose-Capillary: 160 mg/dL — ABNORMAL HIGH (ref 70–99)
Glucose-Capillary: 224 mg/dL — ABNORMAL HIGH (ref 70–99)
Glucose-Capillary: 196 mg/dL — ABNORMAL HIGH (ref 70–99)

## 2020-10-04 LAB — MAGNESIUM: Magnesium: 2.1 mg/dL (ref 1.7–2.4)

## 2020-10-04 MED ORDER — POTASSIUM CHLORIDE CRYS ER 20 MEQ PO TBCR
40.0000 meq | EXTENDED_RELEASE_TABLET | ORAL | Status: AC
  Administered 2020-10-05 (×2): 40 meq via ORAL
  Filled 2020-10-04 (×2): qty 2

## 2020-10-04 MED ORDER — SODIUM CHLORIDE 0.9 % WEIGHT BASED INFUSION: 1.0000 mL/kg/h | INTRAVENOUS | Status: DC

## 2020-10-04 MED ORDER — ASPIRIN 81 MG PO CHEW
81.0000 mg | CHEWABLE_TABLET | ORAL | Status: AC
  Administered 2020-10-05: 81 mg via ORAL
  Filled 2020-10-04: qty 1

## 2020-10-04 MED ORDER — INSULIN ASPART 100 UNIT/ML ~~LOC~~ SOLN
0.0000 [IU] | Freq: Three times a day (TID) | SUBCUTANEOUS | Status: DC
  Administered 2020-10-05: 1 [IU] via SUBCUTANEOUS
  Administered 2020-10-05: 0 [IU] via SUBCUTANEOUS
  Administered 2020-10-05: 3 [IU] via SUBCUTANEOUS
  Administered 2020-10-06 – 2020-10-07 (×5): 2 [IU] via SUBCUTANEOUS
  Administered 2020-10-08: 1 [IU] via SUBCUTANEOUS
  Administered 2020-10-08 – 2020-10-09 (×3): 2 [IU] via SUBCUTANEOUS

## 2020-10-04 MED ORDER — TOLVAPTAN 15 MG PO TABS
15.0000 mg | ORAL_TABLET | ORAL | Status: AC
  Administered 2020-10-04: 15 mg via ORAL
  Filled 2020-10-04: qty 1

## 2020-10-04 MED ORDER — SODIUM CHLORIDE 0.9% FLUSH: 3.0000 mL | INTRAVENOUS | Status: DC | PRN

## 2020-10-04 MED ORDER — AMIODARONE HCL 200 MG PO TABS
400.0000 mg | ORAL_TABLET | Freq: Every day | ORAL | Status: DC
  Administered 2020-10-04 – 2020-10-06 (×3): 400 mg via ORAL
  Filled 2020-10-04 (×3): qty 2

## 2020-10-04 MED ORDER — POTASSIUM CHLORIDE 10 MEQ/100ML IV SOLN: 10.0000 meq | INTRAVENOUS | Status: DC

## 2020-10-04 MED ORDER — POTASSIUM CHLORIDE CRYS ER 20 MEQ PO TBCR: 40.0000 meq | EXTENDED_RELEASE_TABLET | Freq: Once | ORAL | Status: DC

## 2020-10-04 MED ORDER — SODIUM CHLORIDE 0.9 % WEIGHT BASED INFUSION: 3.0000 mL/kg/h | INTRAVENOUS | Status: DC

## 2020-10-04 MED ORDER — POTASSIUM CHLORIDE 10 MEQ/50ML IV SOLN
10.0000 meq | INTRAVENOUS | Status: AC
  Administered 2020-10-04 (×3): 10 meq via INTRAVENOUS
  Filled 2020-10-04 (×3): qty 50

## 2020-10-04 MED ORDER — POTASSIUM CHLORIDE CRYS ER 20 MEQ PO TBCR
40.0000 meq | EXTENDED_RELEASE_TABLET | ORAL | Status: AC
  Administered 2020-10-04 (×4): 40 meq via ORAL
  Filled 2020-10-04 (×4): qty 2

## 2020-10-04 MED ORDER — SODIUM CHLORIDE 0.9% FLUSH
3.0000 mL | Freq: Two times a day (BID) | INTRAVENOUS | Status: DC
Start: 2020-10-04 — End: 2020-10-09
  Administered 2020-10-04: 3 mL via INTRAVENOUS

## 2020-10-04 MED ORDER — SODIUM CHLORIDE 0.9 % IV SOLN: 250.0000 mL | INTRAVENOUS | Status: DC | PRN

## 2020-10-04 NOTE — Progress Notes (Signed)
CARDIAC REHAB PHASE I   PRE:  Rate/Rhythm: 68 SR  BP:  Supine:   Sitting: 100/73  Standing:    SaO2: 95%RA  MODE:  Ambulation: 350 ft   POST:  Rate/Rhythm: 97 SR  BP:  Supine:   Sitting: 111/84  Standing:    SaO2: 89-92%RA 1238-1310 Pt walked 350 ft on RA with cane and asst x1. Gait steady. Monitored sats whole walk and never below 89%. To recliner after walk with call bell. Pt requested oxygen after walk for comfort. Put on 2L. No complaints during walk. Tolerated well.   Luetta Nutting, RN BSN  10/04/2020 1:05 PM

## 2020-10-04 NOTE — Progress Notes (Signed)
Patient ID: Chad Wilkerson, male   DOB: May 12, 1946, 75 y.o.   MRN: [id number]     Advanced Heart Failure Rounding Note  PCP-Cardiologist: Christell Constant, MD   Subjective:    Good diuresis again yesterday after getting metolazone 2.5 x 1, weight down 4 lbs.  Creatinine mildly higher at 1.15.  SBP 90s-110s.  Co-ox 60% on milrinone 0.375.  He remains on Lasix gtt 15 mg/hr.  CVP 12-13 today.    VF with ICD shock on 4/22.  Tikosyn stopped and amiodarone gtt started.  No further VT/VF but still with PVCs.     Objective:   Weight Range: 100 kg Body mass index is 33.03 kg/m.   Vital Signs:   Temp:  [97.5 F (36.4 C)-98.6 F (37 C)] 97.5 F (36.4 C) (04/25 0427) Pulse Rate:  [64-75] 64 (04/25 0427) Resp:  [15-20] 18 (04/25 0427) BP: (92-104)/(62-72) 98/62 (04/25 0427) SpO2:  [93 %-100 %] 93 % (04/25 0825) Weight:  [100 kg] 100 kg (04/25 0427) Last BM Date: 10/02/20  Weight change: Filed Weights   10/02/20 0420 10/03/20 0201 10/04/20 0427  Weight: 102.1 kg 101.9 kg 100 kg    Intake/Output:   Intake/Output Summary (Last 24 hours) at 10/04/2020 0838 Last data filed at 10/04/2020 9528 Gross per 24 hour  Intake 2197.27 ml  Output 6725 ml  Net -4527.73 ml      Physical Exam    General: NAD Neck: JVP 14 cm, no thyromegaly or thyroid nodule.  Lungs: Clear to auscultation bilaterally with normal respiratory effort. CV: Nondisplaced PMI.  Heart regular S1/S2, no S3/S4, 1/6 SEM RUSB.  No peripheral edema.   Abdomen: Soft, nontender, no hepatosplenomegaly, mild distention.  Skin: Intact without lesions or rashes.  Neurologic: Alert and oriented x 3.  Psych: Normal affect. Extremities: No clubbing or cyanosis.  HEENT: Normal.    Telemetry   NSR with 1st degree AVB, occasional PVCs (personally reviewed)   Labs    CBC Recent Labs    10/03/20 0523 10/04/20 0419  WBC 8.7 7.5  HGB 14.2 13.3  HCT 42.5 40.6  MCV 87.1 86.9  PLT 267 246   Basic Metabolic  Panel Recent Labs    10/03/20 1700 10/04/20 0419  NA 129* 124*  K 3.0* 3.0*  CL 86* 84*  CO2 28 29  GLUCOSE 296* 378*  BUN 30* 28*  CREATININE 1.40* 1.15  CALCIUM 9.0 8.7*  MG 2.2 2.1   Liver Function Tests No results for input(s): AST, ALT, ALKPHOS, BILITOT, PROT, ALBUMIN in the last 72 hours. No results for input(s): LIPASE, AMYLASE in the last 72 hours. Cardiac Enzymes No results for input(s): CKTOTAL, CKMB, CKMBINDEX, TROPONINI in the last 72 hours.  BNP: BNP (last 3 results) Recent Labs    09/28/20 1613  BNP 3,328.4*    ProBNP (last 3 results) Recent Labs    04/01/20 1509  PROBNP 5,001*     D-Dimer No results for input(s): DDIMER in the last 72 hours. Hemoglobin A1C No results for input(s): HGBA1C in the last 72 hours. Fasting Lipid Panel No results for input(s): CHOL, HDL, LDLCALC, TRIG, CHOLHDL, LDLDIRECT in the last 72 hours. Thyroid Function Tests No results for input(s): TSH, T4TOTAL, T3FREE, THYROIDAB in the last 72 hours.  Invalid input(s): FREET3  Other results:   Imaging    No results found.   Medications:     Scheduled Medications: . amiodarone  400 mg Oral Daily  . apixaban  5 mg Oral BID  .  atorvastatin  20 mg Oral Daily  . Chlorhexidine Gluconate Cloth  6 each Topical Daily  . dapagliflozin propanediol  10 mg Oral Daily  . digoxin  0.125 mg Oral Daily  . docusate sodium  100 mg Oral BID  . escitalopram  10 mg Oral Daily  . insulin aspart  0-9 Units Subcutaneous TID WC  . losartan  12.5 mg Oral Daily  . mometasone-formoterol  2 puff Inhalation BID  . polyethylene glycol  17 g Oral BID  . potassium chloride  40 mEq Oral Q4H  . sodium chloride flush  10-40 mL Intracatheter Q12H  . sodium chloride flush  3 mL Intravenous Q12H  . spironolactone  25 mg Oral Daily  . tolvaptan  15 mg Oral Q24H    Infusions: . sodium chloride    . furosemide (LASIX) 200 mg in dextrose 5% 100 mL (2mg /mL) infusion 15 mg/hr (10/03/20 2354)  .  milrinone 0.375 mcg/kg/min (10/04/20 0047)  . potassium chloride      PRN Medications: acetaminophen **OR** acetaminophen, ondansetron **OR** ondansetron (ZOFRAN) IV, polyethylene glycol, sodium chloride flush   Assessment/Plan   1. Acute on chronic systolic CHF: Long-standing NICM, followed in the past in [place].  Has MDT ICD.  Echo 09/28/20 with EF <20%, severe LV dilation, mild RV dilation with moderate RV dysfunction, mild-moderate MR, moderate-severe TR, severe biatrial enlargement, no more than moderate AS.  Last cath in 2/22 showed nonobstructive mild CAD, elevated right and left heart filling pressures with low CI 2.04; AS no more than moderate.  Cause of cardiomyopathy uncertain, he is generally in NSR so unlikely to be tachy-mediated CMP.  Possible remote viral infection/viral myocarditis.  NYHA class IV at admission, volume overloaded on exam with CVP 12-13 currently (CVP trending down and weight down).  Co-ox 60% on milrinone 0.375.  Low output HF.  He is on Lasix gtt 15 mg/hr + metolazone 2.5 yesterday with good diuresis again.  - Increase milrinone back to 0.375 with marginal co-ox.   - Cut back Lasix gtt to 12 mg/hr today and will not give metolazone.  He will get tolvaptan (see below).  - Continue digoxin 0.125 daily - Aggressive K repletion and repeat BMET in pm, discussed with pharmacy.  Follow Mg.  - Continue Farxiga and spironolactone 25 mg daily.  - Continue losartan 12.5 mg daily.  - Long-term, concerned about his trajectory.  Has been doing poorly at home.  Suspect low output HF, would like to fully diurese him, then try to wean milrinone and repeat RHC (possibly Tuesday).  He may be a candidate for LVAD placement. Creatinine ok, I do have concern about his RV (moderate dysfunction). Will ask LVAD nurse coordinator to see him today.   2. Atrial fibrillation: Paroxysmal.  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 Off Tikosyn after VF on 4/22.  Did not tolerate amiodarone in  the past but doing well on it so far.  - Continue amiodarone gtt - Continue Eliquis.  - Replace K and Mg.  3. Aortic stenosis: Based on full evaluation (cath and echo), suspect no more than moderate aortic stenosis.  I do not think he would benefit from TAVR at this point.  4. 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  - statin.  5. VF: VF terminated by ICD shock on 4/22.  Milrinone decreased to 0.25.  Tikosyn stopped and amiodarone gtt started.  - Continue amiodarone => transition to po per EP.  - Replace K aggressive and repeat BMET  in pm.  6. Hyponatremia: Hypervolemic hyponatremia.  - Tolvaptan 15 mg x 1 today.   Mobilize with cardiac rehab.   Length of Stay: 6  [name], MD  10/04/2020, 8:38 AM  Advanced Heart Failure Team Pager [phone] (M-F; 7a - 5p)  Please contact [facility] for night-coverage after hours (5p -7a ) and weekends on [email or web address]

## 2020-10-04 NOTE — Progress Notes (Signed)
1048  Pt fast asleep with CPAP. Will follow up later as time permits.Luetta Nutting RN BSN 10/04/2020 10:49 AM

## 2020-10-04 NOTE — Progress Notes (Signed)
Progress Note  Patient Name: Chad Wilkerson Date of Encounter: 10/04/2020  [facility] Cardiologist: Werner Lean, MD   Subjective   NAEO.  Feels a bit better every day  Inpatient Medications    Scheduled Meds: . amiodarone  400 mg Oral Daily  . apixaban  5 mg Oral BID  . atorvastatin  20 mg Oral Daily  . Chlorhexidine Gluconate Cloth  6 each Topical Daily  . dapagliflozin propanediol  10 mg Oral Daily  . digoxin  0.125 mg Oral Daily  . docusate sodium  100 mg Oral BID  . escitalopram  10 mg Oral Daily  . insulin aspart  0-9 Units Subcutaneous TID WC  . losartan  12.5 mg Oral Daily  . mometasone-formoterol  2 puff Inhalation BID  . polyethylene glycol  17 g Oral BID  . potassium chloride  40 mEq Oral Q4H  . sodium chloride flush  10-40 mL Intracatheter Q12H  . sodium chloride flush  3 mL Intravenous Q12H  . spironolactone  25 mg Oral Daily   Continuous Infusions: . sodium chloride    . furosemide (LASIX) 200 mg in dextrose 5% 100 mL (1m/mL) infusion 15 mg/hr (10/03/20 2354)  . milrinone 0.375 mcg/kg/min (10/04/20 0047)   PRN Meds: acetaminophen **OR** acetaminophen, ondansetron **OR** ondansetron (ZOFRAN) IV, polyethylene glycol, sodium chloride flush   Vital Signs    Vitals:   10/03/20 1845 10/03/20 1950 10/03/20 2021 10/04/20 0427  BP: 103/72  104/70 98/62  Pulse: 75  74 64  Resp: _0 Temp:   98.1 F (36.7 C) (!) 97.5 F (36.4 C)  TempSrc:   Oral Axillary  SpO2: 99% 97% 100% 94%  Weight:    100 kg  Height:        Intake/Output Summary (Last 24 hours) at 10/04/2020 0827 Last data filed at 10/04/2020 09678Gross per 24 hour  Intake 2197.27 ml  Output 6725 ml  Net -4527.73 ml   Last 3 Weights 10/04/2020 10/03/2020 10/02/2020  Weight (lbs) 220 lb 7.4 oz 224 lb 9.6 oz 225 lb 1.6 oz  Weight (kg) 100 kg 101.878 kg 102.105 kg      Telemetry    SR 60's, NSVT. No sustained arrhythmias. No further VF episodes. - Personally  Reviewed  ECG    n/a - Personally Reviewed  Physical Exam   GEN: No acute distress.  Chronically ill appearing. Neck: No JVD Cardiac: RRR, 1-2/6 SM, no rubs, or gallops. Warm extremities. No edema. Respiratory: CTA b/l GI: Soft, nontender, non-distended  MS: No edema; No deformity. Neuro:  Nonfocal  Psych: Normal affect   Labs    High Sensitivity Troponin:   Recent Labs  Lab 09/28/20 1612 09/28/20 1812  TROPONINIHS 26* 28*      Chemistry Recent Labs  Lab 09/28/20 1612 09/29/20 0040 10/03/20 0523 10/03/20 1700 10/04/20 0419  NA 133*   < > 129* 129* 124*  K 3.7   < > 3.0* 3.0* 3.0*  CL 98   < > 89* 86* 84*  CO2 26   < > _1 GLUCOSE 131*   < > 329* 296* 378*  BUN 20   < > 25* 30* 28*  CREATININE 1.03   < > 1.05 1.40* 1.15  CALCIUM 8.9   < > 8.8* 9.0 8.7*  PROT 6.9  --   --   --   --   ALBUMIN 3.6  --   --   --   --  AST 33  --   --   --   --   ALT 25  --   --   --   --   ALKPHOS 77  --   --   --   --   BILITOT 1.5*  --   --   --   --   GFRNONAA >60   < > >60 53* >60  ANIONGAP 9   < > _0 < > = values in this interval not displayed.     Hematology Recent Labs  Lab 10/02/20 0447 10/03/20 0523 10/04/20 0419  WBC 9.5 8.7 7.5  RBC 4.75 4.88 4.67  HGB 13.8 14.2 13.3  HCT 41.6 42.5 40.6  MCV 87.6 87.1 86.9  MCH 29.1 29.1 28.5  MCHC 33.2 33.4 32.8  RDW 16.1* 16.1* 15.8*  PLT 253 267 246    BNP Recent Labs  Lab 09/28/20 1613  BNP 3,328.4*     DDimer No results for input(s): DDIMER in the last 168 hours.   Radiology    No results found.  Cardiac Studies    09/28/2020: TTE IMPRESSIONS  1. Left ventricular ejection fraction, by estimation, is <20%. The left  ventricle has severely decreased function. The left ventricle demonstrates  global hypokinesis. The left ventricular internal cavity size was severely  dilated. Left ventricular  diastolic parameters are consistent with Grade III diastolic dysfunction  (restrictive).   2. Right ventricular systolic function is moderately reduced. The right  ventricular size is mildly enlarged. There is normal pulmonary artery  systolic pressure.  3. Left atrial size was severely dilated.  4. Right atrial size was severely dilated.  5. The mitral valve is grossly normal. Mild to moderate mitral valve  regurgitation.  6. Tricuspid valve regurgitation is moderate to severe.  7. The aortic valve is grossly normal. Aortic valve regurgitation is not  visualized.  8. Aortic dilatation noted. There is mild dilatation of the ascending  aorta, measuring 41 mm.    07/19/2020: R/LHC  Prox RCA to Mid RCA lesion is 20% stenosed.  1st Mrg lesion is 40% stenosed.  Mid LAD lesion is 20% stenosed.   1. Mild non-obstructive CAD 2. Elevated filling pressures: RA:13, RV 64/8/14 PA 64/26 (mean 40), PCWP: 28, LV: 101/15/25  AO: 90/60 CO: 4.43 L/min  CI: 2.04  3. Moderate aortic stenosis. He has low gradients across the valve by echo and by cath. Cath: mean gradient 10.5 mmHg, peak to peak gradient 12 mmhg, AVA 1.83 cm2).  The valve was easily crossed with the J wire. Cannot exclude low flow/low gradient AS but the dimensionless index and AVA findings on echo would also argue against this being severe.      Assessment & Plan    75yo pleasant man with severe NICM s/p ICD implant 12/04/2016, AFib, DM, HTN, OSA w/CPAP Previously followed in [place]  who Dr. Quentin Ore previously met to initiate dofetilide for his paroxysmal symptomatic AF is now admitted with decompensated HF complicated by ventricular arrhythmias including VF event requiring ICD shock. Patient was transitioned to amiodarone from dofetilide and initiated on milrinone for hemodynamic support.   #VT/VF No further sustained episodes . Transition to PO amiodarone today 449m daily for 1 week then 2094mdaily Keep K>4, Mg>2  # Paroxysmal AF    failed Tikosyn    CHA2DS2Vasc is 2  Amiodarone Eliquis,  appropriately dosed   #Acutely decompensated chronic systolic HF 2/2 NICM Steady improvement symptom-wise On milrinone, lasix gtts  Hypokalemia hyponatremia Continue management with HF team   For questions or updates, please contact [place] Please consult [email or web address] for contact info under        Signed, Baldwin [place], PA-C  10/04/2020, 8:27 AM

## 2020-10-04 NOTE — Progress Notes (Signed)
Pt has home CPAP.  

## 2020-10-04 NOTE — TOC Progression Note (Addendum)
Transition of Care (TOC) - Progression Note  Heart Failure   Patient Details  Name: ADELBERT GASPARD MRN: [id number] Date of Birth: 03/24/1946  Transition of Care [facility]) CM/SW Contact  Hasson Gaspard, LCSWA Phone Number: 10/04/2020, 12:25 PM  Clinical Narrative:    CSW spoke with the patient at bedside and completed very brief SDOH screening with the patient who denied having any needs at this time. Patient reported they do have a PCP and they can get to the pharmacy to pick up their medications. Mr. Wager reported that he has a great partner to help provide support at home but he is looking for some elder/senior resources in the area for support with filing his taxes or for help getting a stairlift as the patient reported his health insurance won't cover the cost. CSW explained to the patient that typically health insurance will cover medical equipment if it is medically necessary for the senior/patient in question and advised by a licensed healthcare professional and encouraged the patient to speak with the doctor or his primary care doctor about this. CSW will provide Mr. Geissinger with resources for support. CSW provided the patient with social workers name, number and position and to reach out to CSW as other social needs arise.  TOC will continue to follow for d/c needs.        Expected Discharge Plan and Services                                                 Social Determinants of Health (SDOH) Interventions Food Insecurity Interventions: Intervention Not Indicated Financial Strain Interventions: Intervention Not Indicated Housing Interventions: Intervention Not Indicated Transportation Interventions: Intervention Not Indicated  Readmission Risk Interventions No flowsheet data found.  Phinehas Grounds, MSW, LCSWA [phone] Heart Failure Social Worker

## 2020-10-04 NOTE — Progress Notes (Signed)
PROGRESS NOTE    Chad Wilkerson  KDX:[id number] DOB: 12/24/1945 DOA: 09/28/2020 PCP: [name], NP    Chief Complaint  Patient presents with  . Shortness of Breath   Brief Narrative:  Chad Wilkerson is Chad Wilkerson 75 y.o. male with medical history significant for chronic combined systolic and diastolic CHF (EF less than 20%, G3 DD by TTE 09/28/2020), NICM, s/p ICD, atrial fibrillation on Eliquis and Tikosyn, T2DM, HTN, and OSA on BiPAP who presented to the ED for evaluation of shortness of breath and weightgain.  Patient reports 1 week of progressive dyspnea, DOE, orthopnea weight gain.  He was seen in follow-up in his cardiology office earlier today.  He underwent echocardiogram which showed EF <20%.  He had significant shortness of breath therefore was sent to the ED for further management.  He's been admitted with HF exacerbation.  Assessment & Plan:   Principal Problem:   Acute on chronic combined systolic (congestive) and diastolic (congestive) heart failure (HCC) Active Problems:   Sleep apnea   Paroxysmal atrial fibrillation (HCC)   ICD (implantable cardioverter-defibrillator) in place  Chad Wilkerson is Chad Wilkerson 75 y.o. male with medical history significant for chronic combined systolic and diastolic CHF (EF less than 20%, G3 DD by TTE 09/28/2020), NICM, s/p ICD, atrial fibrillation on Eliquis and Tikosyn, T2DM, HTN, and OSA on BiPAP who is admitted with acute on chronic combined systolic and diastolic CHF.  Ventricular Fibrillation  - run of vfib this PM s/p shock from ICD - continue amiodarone per cards -> 400 mg PO daily x1 week, then 200 mg daily.  Continue milrinone for now. - holding tikosyn at this time - appreciate cards assistance  Acute on chronic combined systolic and diastolic CHF s/p ICD: TTE 4/19 prior to admit showed EF <20% with G3 DD. Also moderately reduced RVSF.   Cardiology following and managing diuresis.  At risk for low output heart failure and  therefore started on milrinone. -Cardiology following - appreciate assistance -> milrinone per cards, lasix gtt per cards, tolvaptan, farxiga, spironolactone increased to 25 mg daily, losartan 12.5 mg daily, digoxin - holding additional metolazone - possibly repeat RHC after diuresed (?Tuesday), possible LVAD candidate?  LVAD nurse coordinator to see on Monday. -Monitor strict I/O's and daily weights  Hyponatremia Related to hyperglycemia (corrects to 128-131), also metolazone and diuresis Getting tolvaptan today  Hypokalemia Replace aggressively and follow  Hypotension: Holding home Coreg.  On milrinone as above.  IV lasix.  Spironolactone.  Losartan  Paroxysmal atrial fibrillation: Continue Eliquis and amiodarone.  Holding Coreg.  Type 2 diabetes: Hold home metformin.  Continue farxiga.  SSI.   OSA: Continue BiPAP nightly.  DVT prophylaxis: eliquis Code Status: DNR Family Communication: none at bedside Disposition:   Status is: Inpatient  Remains inpatient appropriate because:Inpatient level of care appropriate due to severity of illness   Dispo: The patient is from: home              Anticipated d/c is to: pending              Patient currently is not medically stable to d/c.   Difficult to place patient No       Consultants:   cardiology  Procedures: Echo IMPRESSIONS    1. Left ventricular ejection fraction, by estimation, is <20%. The left  ventricle has severely decreased function. The left ventricle demonstrates  global hypokinesis. The left ventricular internal cavity size was severely  dilated. Left ventricular  diastolic parameters  are consistent with Grade III diastolic dysfunction  (restrictive).  2. Right ventricular systolic function is moderately reduced. The right  ventricular size is mildly enlarged. There is normal pulmonary artery  systolic pressure.  3. Left atrial size was severely dilated.  4. Right atrial size was severely  dilated.  5. The mitral valve is grossly normal. Mild to moderate mitral valve  regurgitation.  6. Tricuspid valve regurgitation is moderate to severe.  7. The aortic valve is grossly normal. Aortic valve regurgitation is not  visualized.  8. Aortic dilatation noted. There is mild dilatation of the ascending  aorta, measuring 41 mm.  Antimicrobials: Anti-infectives (From admission, onward)   None         Subjective: Feeling ok First impression is that he wouldn't be interested in LVAD I encouraged him to follow up discussion with Dr. Shirlee Latch  Objective: Vitals:   10/04/20 0427 10/04/20 0825 10/04/20 1117 10/04/20 1306  BP: 98/62  100/64   Pulse: 64  63   Resp: 18  17   Temp: (!) 97.5 F (36.4 C)  97.9 F (36.6 C)   TempSrc: Axillary  Axillary   SpO2: 94% 93% 95%   Weight: 100 kg   100 kg  Height:        Intake/Output Summary (Last 24 hours) at 10/04/2020 1455 Last data filed at 10/04/2020 1031 Gross per 24 hour  Intake 1717.27 ml  Output 5550 ml  Net -3832.73 ml   Filed Weights   10/03/20 0201 10/04/20 0427 10/04/20 1306  Weight: 101.9 kg 100 kg 100 kg    Examination:  General: No acute distress. Cardiovascular: Heart sounds show Chad Wilkerson regular rate, and rhythm.  Lungs: Clear to auscultation bilaterally  Abdomen: Soft, nontender, nondistended  Neurological: Alert and oriented 3. Moves all extremities 4 . Cranial nerves II through XII grossly intact. Skin: Warm and dry. No rashes or lesions. Extremities: No clubbing or cyanosis. No edema.   Data Reviewed: I have personally reviewed following labs and imaging studies  CBC: Recent Labs  Lab 09/28/20 1612 09/29/20 0040 09/30/20 0429 10/01/20 0450 10/02/20 0447 10/03/20 0523 10/04/20 0419  WBC 8.9   < > 9.0 9.3 9.5 8.7 7.5  NEUTROABS 6.8  --   --   --   --   --   --   HGB 15.0   < > 13.9 14.2 13.8 14.2 13.3  HCT 45.8   < > 41.7 43.1 41.6 42.5 40.6  MCV 89.3   < > 87.8 88.5 87.6 87.1 86.9  PLT 257    < > 213 231 253 267 246   < > = values in this interval not displayed.    Basic Metabolic Panel: Recent Labs  Lab 10/01/20 0450 10/01/20 1443 10/01/20 2216 10/02/20 0447 10/02/20 1642 10/03/20 0523 10/03/20 1700 10/04/20 0419  NA 136 135   < > 131* 132* 129* 129* 124*  K 3.3* 2.6*   < > 3.2* 3.5 3.0* 3.0* 3.0*  CL 96* 89*   < > 90* 92* 89* 86* 84*  CO2 31 33*   < > 32 30 30 28 29   GLUCOSE 145* 187*   < > 234* 182* 329* 296* 378*  BUN 14 14   < > 19 24* 25* 30* 28*  CREATININE 0.98 0.94   < > 0.93 1.16 1.05 1.40* 1.15  CALCIUM 9.1 9.3   < > 8.9 9.2 8.8* 9.0 8.7*  MG 2.2 2.2  --  2.1  --   --  2.2 2.1   < > = values in this interval not displayed.    GFR: Estimated Creatinine Clearance: 65.2 mL/min (by C-G formula based on SCr of 1.15 mg/dL).  Liver Function Tests: Recent Labs  Lab 09/28/20 1612  AST 33  ALT 25  ALKPHOS 77  BILITOT 1.5*  PROT 6.9  ALBUMIN 3.6    CBG: Recent Labs  Lab 10/03/20 1159 10/03/20 1632 10/03/20 2119 10/04/20 0603 10/04/20 1115  GLUCAP 190* 128* 158* 160* 224*     Recent Results (from the past 240 hour(s))  Resp Panel by RT-PCR (Flu Pranavi Aure&B, Covid) Nasopharyngeal Swab     Status: None   Collection Time: 09/28/20  4:13 PM   Specimen: Nasopharyngeal Swab; Nasopharyngeal(NP) swabs in vial transport medium  Result Value Ref Range Status   SARS Coronavirus 2 by RT PCR NEGATIVE NEGATIVE Final    Comment: (NOTE) SARS-CoV-2 target nucleic acids are NOT DETECTED.  The SARS-CoV-2 RNA is generally detectable in upper respiratory specimens during the acute phase of infection. The lowest concentration of SARS-CoV-2 viral copies this assay can detect is 138 copies/mL. Ram Haugan negative result does not preclude SARS-Cov-2 infection and should not be used as the sole basis for treatment or other patient management decisions. Sylvia Kondracki negative result may occur with  improper specimen collection/handling, submission of specimen other than nasopharyngeal swab,  presence of viral mutation(s) within the areas targeted by this assay, and inadequate number of viral copies(<138 copies/mL). Felesha Moncrieffe negative result must be combined with clinical observations, patient history, and epidemiological information. The expected result is Negative.  Fact Sheet for Patients:  BloggerCourse.com  Fact Sheet for Healthcare Providers:  SeriousBroker.it  This test is no t yet approved or cleared by the [place] FDA and  has been authorized for detection and/or diagnosis of SARS-CoV-2 by FDA under an Emergency Use Authorization (EUA). This EUA will remain  in effect (meaning this test can be used) for the duration of the COVID-19 declaration under Section 564(b)(1) of the Act, 21 U.S.C.section 360bbb-3(b)(1), unless the authorization is terminated  or revoked sooner.       Influenza Lalita Ebel by PCR NEGATIVE NEGATIVE Final   Influenza B by PCR NEGATIVE NEGATIVE Final    Comment: (NOTE) The Xpert Xpress SARS-CoV-2/FLU/RSV plus assay is intended as an aid in the diagnosis of influenza from Nasopharyngeal swab specimens and should not be used as Martavion Couper sole basis for treatment. Nasal washings and aspirates are unacceptable for Xpert Xpress SARS-CoV-2/FLU/RSV testing.  Fact Sheet for Patients: BloggerCourse.com  Fact Sheet for Healthcare Providers: SeriousBroker.it  This test is not yet approved or cleared by the [place] FDA and has been authorized for detection and/or diagnosis of SARS-CoV-2 by FDA under an Emergency Use Authorization (EUA). This EUA will remain in effect (meaning this test can be used) for the duration of the COVID-19 declaration under Section 564(b)(1) of the Act, 21 U.S.C. section 360bbb-3(b)(1), unless the authorization is terminated or revoked.  Performed at [facility], [address] [place], [place] [address]           Radiology Studies: No results found.      Scheduled Meds: . amiodarone  400 mg Oral Daily  . [START ON 10/05/2020] aspirin  81 mg Oral Pre-Cath  . atorvastatin  20 mg Oral Daily  . Chlorhexidine Gluconate Cloth  6 each Topical Daily  . dapagliflozin propanediol  10 mg Oral Daily  . digoxin  0.125 mg Oral Daily  . docusate sodium  100 mg Oral BID  . escitalopram  10 mg Oral Daily  . insulin aspart  0-9 Units Subcutaneous TID WC  . losartan  12.5 mg Oral Daily  . mometasone-formoterol  2 puff Inhalation BID  . polyethylene glycol  17 g Oral BID  . potassium chloride  40 mEq Oral Q4H  . sodium chloride flush  10-40 mL Intracatheter Q12H  . sodium chloride flush  3 mL Intravenous Q12H  . sodium chloride flush  3 mL Intravenous Q12H  . spironolactone  25 mg Oral Daily   Continuous Infusions: . sodium chloride    . sodium chloride    . [START ON 10/05/2020] sodium chloride     Followed by  . [START ON 10/05/2020] sodium chloride    . furosemide (LASIX) 200 mg in dextrose 5% 100 mL (2mg /mL) infusion 12 mg/hr (10/04/20 0908)  . milrinone 0.375 mcg/kg/min (10/04/20 0919)     LOS: 6 days    Time spent: over 30 min    10/06/20, MD [facility]   To contact the attending provider between 7A-7P or the covering provider during after hours 7P-7A, please log into the web site [email or web address] and access using universal [place] password for that web site. If you do not have the password, please call the hospital operator.  10/04/2020, 2:55 PM

## 2020-10-05 ENCOUNTER — Encounter (HOSPITAL_COMMUNITY): Payer: Self-pay | Admitting: Cardiology

## 2020-10-05 ENCOUNTER — Encounter (HOSPITAL_COMMUNITY)
Admission: EM | Disposition: A | Discharge status: Home / Self Care | Payer: Self-pay | Source: Home / Self Care | Attending: Family Medicine

## 2020-10-05 ENCOUNTER — Other Ambulatory Visit: Payer: Self-pay | Admitting: Adult Health

## 2020-10-05 DIAGNOSIS — I509 Heart failure, unspecified: Secondary | ICD-10-CM

## 2020-10-05 DIAGNOSIS — I5043 Acute on chronic combined systolic (congestive) and diastolic (congestive) heart failure: Secondary | ICD-10-CM | POA: Diagnosis not present

## 2020-10-05 DIAGNOSIS — I480 Paroxysmal atrial fibrillation: Secondary | ICD-10-CM | POA: Diagnosis not present

## 2020-10-05 HISTORY — PX: RIGHT HEART CATH: CATH118263

## 2020-10-05 LAB — POCT I-STAT EG7
Acid-Base Excess: 5 mmol/L — ABNORMAL HIGH (ref 0.0–2.0)
Acid-Base Excess: 5 mmol/L — ABNORMAL HIGH (ref 0.0–2.0)
Bicarbonate: 31.2 mmol/L — ABNORMAL HIGH (ref 20.0–28.0)
Bicarbonate: 31.9 mmol/L — ABNORMAL HIGH (ref 20.0–28.0)
Calcium, Ion: 1.16 mmol/L (ref 1.15–1.40)
Calcium, Ion: 1.21 mmol/L (ref 1.15–1.40)
HCT: 47 % (ref 39.0–52.0)
HCT: 48 % (ref 39.0–52.0)
Hemoglobin: 16 g/dL (ref 13.0–17.0)
Hemoglobin: 16.3 g/dL (ref 13.0–17.0)
O2 Saturation: 73 %
O2 Saturation: 74 %
Potassium: 3.5 mmol/L (ref 3.5–5.1)
Potassium: 3.6 mmol/L (ref 3.5–5.1)
Sodium: 138 mmol/L (ref 135–145)
Sodium: 138 mmol/L (ref 135–145)
TCO2: 33 mmol/L — ABNORMAL HIGH (ref 22–32)
TCO2: 33 mmol/L — ABNORMAL HIGH (ref 22–32)
pCO2, Ven: 50.6 mmHg (ref 44.0–60.0)
pCO2, Ven: 51.4 mmHg (ref 44.0–60.0)
pH, Ven: 7.398 (ref 7.250–7.430)
pH, Ven: 7.401 (ref 7.250–7.430)
pO2, Ven: 40 mmHg (ref 32.0–45.0)
pO2, Ven: 40 mmHg (ref 32.0–45.0)

## 2020-10-05 LAB — DIGOXIN LEVEL: Digoxin Level: 0.3 ng/mL — ABNORMAL LOW (ref 0.8–2.0)

## 2020-10-05 LAB — COMPREHENSIVE METABOLIC PANEL
ALT: 60 U/L — ABNORMAL HIGH (ref 0–44)
AST: 58 U/L — ABNORMAL HIGH (ref 15–41)
Albumin: 3.8 g/dL (ref 3.5–5.0)
Alkaline Phosphatase: 108 U/L (ref 38–126)
Anion gap: 11 (ref 5–15)
BUN: 32 mg/dL — ABNORMAL HIGH (ref 8–23)
CO2: 31 mmol/L (ref 22–32)
Calcium: 9.6 mg/dL (ref 8.9–10.3)
Chloride: 92 mmol/L — ABNORMAL LOW (ref 98–111)
Creatinine, Ser: 1.1 mg/dL (ref 0.61–1.24)
GFR, Estimated: >60 mL/min (ref >60)
Glucose, Bld: 157 mg/dL — ABNORMAL HIGH (ref 70–99)
Potassium: 3.4 mmol/L — ABNORMAL LOW (ref 3.5–5.1)
Sodium: 134 mmol/L — ABNORMAL LOW (ref 135–145)
Total Bilirubin: 2.2 mg/dL — ABNORMAL HIGH (ref 0.3–1.2)
Total Protein: 8.1 g/dL (ref 6.5–8.1)

## 2020-10-05 LAB — CBC WITH DIFFERENTIAL/PLATELET
Abs Immature Granulocytes: 0.02 10*3/uL (ref 0.00–0.07)
Basophils Absolute: 0.1 10*3/uL (ref 0.0–0.1)
Basophils Relative: 1 %
Eosinophils Absolute: 0.2 10*3/uL (ref 0.0–0.5)
Eosinophils Relative: 2 %
HCT: 45.1 % (ref 39.0–52.0)
Hemoglobin: 15.4 g/dL (ref 13.0–17.0)
Immature Granulocytes: 0 %
Lymphocytes Relative: 13 %
Lymphs Abs: 1.1 10*3/uL (ref 0.7–4.0)
MCH: 29.3 pg (ref 26.0–34.0)
MCHC: 34.1 g/dL (ref 30.0–36.0)
MCV: 85.9 fL (ref 80.0–100.0)
Monocytes Absolute: 1 10*3/uL (ref 0.1–1.0)
Monocytes Relative: 11 %
Neutro Abs: 6.4 10*3/uL (ref 1.7–7.7)
Neutrophils Relative %: 73 %
Platelets: 293 10*3/uL (ref 150–400)
RBC: 5.25 MIL/uL (ref 4.22–5.81)
RDW: 15.9 % — ABNORMAL HIGH (ref 11.5–15.5)
WBC: 8.7 10*3/uL (ref 4.0–10.5)
nRBC: 0 % (ref 0.0–0.2)

## 2020-10-05 LAB — BASIC METABOLIC PANEL
Anion gap: 12 (ref 5–15)
BUN: 31 mg/dL — ABNORMAL HIGH (ref 8–23)
CO2: 29 mmol/L (ref 22–32)
Calcium: 9.1 mg/dL (ref 8.9–10.3)
Chloride: 94 mmol/L — ABNORMAL LOW (ref 98–111)
Creatinine, Ser: 1.05 mg/dL (ref 0.61–1.24)
GFR, Estimated: >60 mL/min (ref >60)
Glucose, Bld: 250 mg/dL — ABNORMAL HIGH (ref 70–99)
Potassium: 3.3 mmol/L — ABNORMAL LOW (ref 3.5–5.1)
Sodium: 135 mmol/L (ref 135–145)

## 2020-10-05 LAB — COOXEMETRY PANEL
Carboxyhemoglobin: 1.4 % (ref 0.5–1.5)
Methemoglobin: 0.8 % (ref 0.0–1.5)
O2 Saturation: 63 %
Total hemoglobin: 15.5 g/dL (ref 12.0–16.0)

## 2020-10-05 LAB — GLUCOSE, CAPILLARY
Glucose-Capillary: 161 mg/dL — ABNORMAL HIGH (ref 70–99)
Glucose-Capillary: 144 mg/dL — ABNORMAL HIGH (ref 70–99)
Glucose-Capillary: 212 mg/dL — ABNORMAL HIGH (ref 70–99)
Glucose-Capillary: 198 mg/dL — ABNORMAL HIGH (ref 70–99)

## 2020-10-05 LAB — PHOSPHORUS: Phosphorus: 4.8 mg/dL — ABNORMAL HIGH (ref 2.5–4.6)

## 2020-10-05 LAB — MAGNESIUM
Magnesium: 2.2 mg/dL (ref 1.7–2.4)
Magnesium: 2.3 mg/dL (ref 1.7–2.4)

## 2020-10-05 SURGERY — RIGHT HEART CATH
Anesthesia: LOCAL

## 2020-10-05 MED ORDER — POTASSIUM CHLORIDE CRYS ER 20 MEQ PO TBCR
40.0000 meq | EXTENDED_RELEASE_TABLET | ORAL | Status: AC
  Administered 2020-10-05 (×4): 40 meq via ORAL
  Filled 2020-10-05 (×4): qty 2

## 2020-10-05 MED ORDER — HEPARIN (PORCINE) IN NACL 1000-0.9 UT/500ML-% IV SOLN
INTRAVENOUS | Status: AC
  Filled 2020-10-05: qty 500

## 2020-10-05 MED ORDER — ONDANSETRON HCL 4 MG/2ML IJ SOLN: 4.0000 mg | Freq: Four times a day (QID) | INTRAMUSCULAR | Status: DC | PRN

## 2020-10-05 MED ORDER — FENTANYL CITRATE (PF) 100 MCG/2ML IJ SOLN
INTRAMUSCULAR | Status: DC | PRN
  Administered 2020-10-05: 25 ug via INTRAVENOUS

## 2020-10-05 MED ORDER — ONETOUCH ULTRA VI STRP: ORAL_STRIP | 3 refills | Status: DC

## 2020-10-05 MED ORDER — LIDOCAINE HCL (PF) 1 % IJ SOLN
INTRAMUSCULAR | Status: AC
  Filled 2020-10-05: qty 30

## 2020-10-05 MED ORDER — SODIUM CHLORIDE 0.9 % IV SOLN: 250.0000 mL | INTRAVENOUS | Status: DC | PRN

## 2020-10-05 MED ORDER — LABETALOL HCL 5 MG/ML IV SOLN: 10.0000 mg | INTRAVENOUS | Status: AC | PRN

## 2020-10-05 MED ORDER — MIDAZOLAM HCL 2 MG/2ML IJ SOLN
INTRAMUSCULAR | Status: AC
  Filled 2020-10-05: qty 2

## 2020-10-05 MED ORDER — LIDOCAINE HCL (PF) 1 % IJ SOLN
INTRAMUSCULAR | Status: DC | PRN
  Administered 2020-10-05: 15 mL

## 2020-10-05 MED ORDER — MILRINONE LACTATE IN DEXTROSE 20-5 MG/100ML-% IV SOLN
0.1250 ug/kg/min | INTRAVENOUS | Status: DC
  Administered 2020-10-05: 0.25 ug/kg/min via INTRAVENOUS
  Administered 2020-10-06: 0.125 ug/kg/min via INTRAVENOUS
  Administered 2020-10-06: 0.25 ug/kg/min via INTRAVENOUS
  Filled 2020-10-05 (×3): qty 100

## 2020-10-05 MED ORDER — MIDAZOLAM HCL 2 MG/2ML IJ SOLN
INTRAMUSCULAR | Status: DC | PRN
  Administered 2020-10-05: 1 mg via INTRAVENOUS

## 2020-10-05 MED ORDER — HYDRALAZINE HCL 20 MG/ML IJ SOLN: 10.0000 mg | INTRAMUSCULAR | Status: AC | PRN

## 2020-10-05 MED ORDER — FUROSEMIDE 10 MG/ML IJ SOLN
12.0000 mg/h | INTRAVENOUS | Status: DC
  Administered 2020-10-05 – 2020-10-06 (×2): 12 mg/h via INTRAVENOUS
  Filled 2020-10-05 (×2): qty 20

## 2020-10-05 MED ORDER — SODIUM CHLORIDE 0.9% FLUSH
3.0000 mL | INTRAVENOUS | Status: DC | PRN
  Administered 2020-10-09: 3 mL via INTRAVENOUS

## 2020-10-05 MED ORDER — APIXABAN 5 MG PO TABS
5.0000 mg | ORAL_TABLET | Freq: Two times a day (BID) | ORAL | Status: DC
  Administered 2020-10-05 – 2020-10-09 (×8): 5 mg via ORAL
  Filled 2020-10-05 (×8): qty 1

## 2020-10-05 MED ORDER — FENTANYL CITRATE (PF) 100 MCG/2ML IJ SOLN
INTRAMUSCULAR | Status: AC
  Filled 2020-10-05: qty 2

## 2020-10-05 MED ORDER — ACETAMINOPHEN 325 MG PO TABS: 650.0000 mg | ORAL_TABLET | ORAL | Status: DC | PRN

## 2020-10-05 MED ORDER — SODIUM CHLORIDE 0.9% FLUSH: 3.0000 mL | Freq: Two times a day (BID) | INTRAVENOUS | Status: DC

## 2020-10-05 MED ORDER — FUROSEMIDE 10 MG/ML IJ SOLN
40.0000 mg | Freq: Once | INTRAMUSCULAR | Status: AC
  Administered 2020-10-05: 40 mg via INTRAVENOUS
  Filled 2020-10-05: qty 4

## 2020-10-05 MED ORDER — SODIUM CHLORIDE 0.9 % IV SOLN: INTRAVENOUS | Status: DC

## 2020-10-05 MED ORDER — HEPARIN (PORCINE) IN NACL 1000-0.9 UT/500ML-% IV SOLN
INTRAVENOUS | Status: DC | PRN
  Administered 2020-10-05: 500 mL

## 2020-10-05 SURGICAL SUPPLY — 6 items
CATH SWAN GANZ 7F STRAIGHT (CATHETERS) ×1 IMPLANT
GLIDESHEATH SLENDER 7FR .021G (SHEATH) IMPLANT
KIT HEART LEFT (KITS) ×2 IMPLANT
PACK CARDIAC CATHETERIZATION (CUSTOM PROCEDURE TRAY) ×2 IMPLANT
SHEATH PINNACLE 7F 10CM (SHEATH) ×1 IMPLANT
TRANSDUCER W/STOPCOCK (MISCELLANEOUS) ×2 IMPLANT

## 2020-10-05 NOTE — H&P (View-Only) (Signed)
Patient ID: Chad Wilkerson, male   DOB: July 17, 1945, 75 y.o.   MRN: [id number]     Advanced Heart Failure Rounding Note  PCP-Cardiologist: Christell Constant, MD   Subjective:    Good diuresis again yesterday with Lasix gtt 12 mg/hr + tolvaptan 15 mg x 1, weight down 8 lbs.  Creatinine stable at 1.1.  SBP 90s-110s.  Co-ox 63% on milrinone 0.375.  CVP down to 8 today.     VF with ICD shock on 4/22.  Tikosyn stopped and amiodarone gtt started.  No further VT/VF but still with PVCs.     Objective:   Weight Range: 96.4 kg Body mass index is 31.86 kg/m.   Vital Signs:   Temp:  [97.5 F (36.4 C)-98.5 F (36.9 C)] 97.5 F (36.4 C) (04/26 0435) Pulse Rate:  [63-73] 73 (04/26 0435) Resp:  [17-20] 20 (04/26 0435) BP: (96-101)/(64-76) 101/76 (04/26 0435) SpO2:  [90 %-96 %] 92 % (04/26 0435) Weight:  [96.4 kg-100 kg] 96.4 kg (04/26 0433) Last BM Date: 10/02/20  Weight change: Filed Weights   10/04/20 0427 10/04/20 1306 10/05/20 0433  Weight: 100 kg 100 kg 96.4 kg    Intake/Output:   Intake/Output Summary (Last 24 hours) at 10/05/2020 0825 Last data filed at 10/05/2020 0814 Gross per 24 hour  Intake 1326.77 ml  Output 8225 ml  Net -6898.23 ml      Physical Exam    General: NAD Neck: JVP 8 cm, no thyromegaly or thyroid nodule.  Lungs: Clear to auscultation bilaterally with normal respiratory effort. CV: Nondisplaced PMI.  Heart regular S1/S2, no S3/S4, 1/6 SEM RUSB.  No peripheral edema.   Abdomen: Soft, nontender, no hepatosplenomegaly, no distention.  Skin: Intact without lesions or rashes.  Neurologic: Alert and oriented x 3.  Psych: Normal affect. Extremities: No clubbing or cyanosis.  HEENT: Normal.    Telemetry   NSR, occasional PVCs (personally reviewed)   Labs    CBC Recent Labs    10/04/20 0419 10/05/20 0418  WBC 7.5 8.7  NEUTROABS  --  6.4  HGB 13.3 15.4  HCT 40.6 45.1  MCV 86.9 85.9  PLT 246 293   Basic Metabolic Panel Recent Labs     10/04/20 0419 10/04/20 1701 10/04/20 2140 10/05/20 0418  NA 124*   < > 128* 134*  K 3.0*   < > 2.9* 3.4*  CL 84*   < > 88* 92*  CO2 29   < > 29 31  GLUCOSE 378*   < > 287* 157*  BUN 28*   < > 32* 32*  CREATININE 1.15   < > 1.14 1.10  CALCIUM 8.7*   < > 9.0 9.6  MG 2.1  --   --  2.2  PHOS  --   --   --  4.8*   < > = values in this interval not displayed.   Liver Function Tests Recent Labs    10/05/20 0418  AST 58*  ALT 60*  ALKPHOS 108  BILITOT 2.2*  PROT 8.1  ALBUMIN 3.8   No results for input(s): LIPASE, AMYLASE in the last 72 hours. Cardiac Enzymes No results for input(s): CKTOTAL, CKMB, CKMBINDEX, TROPONINI in the last 72 hours.  BNP: BNP (last 3 results) Recent Labs    09/28/20 1613  BNP 3,328.4*    ProBNP (last 3 results) Recent Labs    04/01/20 1509  PROBNP 5,001*     D-Dimer No results for input(s): DDIMER in the last 72  hours. Hemoglobin A1C No results for input(s): HGBA1C in the last 72 hours. Fasting Lipid Panel No results for input(s): CHOL, HDL, LDLCALC, TRIG, CHOLHDL, LDLDIRECT in the last 72 hours. Thyroid Function Tests No results for input(s): TSH, T4TOTAL, T3FREE, THYROIDAB in the last 72 hours.  Invalid input(s): FREET3  Other results:   Imaging    No results found.   Medications:     Scheduled Medications: . amiodarone  400 mg Oral Daily  . atorvastatin  20 mg Oral Daily  . Chlorhexidine Gluconate Cloth  6 each Topical Daily  . dapagliflozin propanediol  10 mg Oral Daily  . digoxin  0.125 mg Oral Daily  . docusate sodium  100 mg Oral BID  . escitalopram  10 mg Oral Daily  . insulin aspart  0-9 Units Subcutaneous TID WC  . losartan  12.5 mg Oral Daily  . mometasone-formoterol  2 puff Inhalation BID  . polyethylene glycol  17 g Oral BID  . sodium chloride flush  10-40 mL Intracatheter Q12H  . sodium chloride flush  3 mL Intravenous Q12H  . sodium chloride flush  3 mL Intravenous Q12H  . spironolactone  25 mg Oral  Daily    Infusions: . sodium chloride    . sodium chloride    . sodium chloride 10 mL/hr at 10/05/20 0454  . milrinone      PRN Medications: sodium chloride, acetaminophen **OR** acetaminophen, ondansetron **OR** ondansetron (ZOFRAN) IV, polyethylene glycol, sodium chloride flush, sodium chloride flush   Assessment/Plan   1. Acute on chronic systolic CHF: Long-standing NICM, followed in the past in [place].  Has MDT ICD.  Echo 09/28/20 with EF <20%, severe LV dilation, mild RV dilation with moderate RV dysfunction, mild-moderate MR, moderate-severe TR, severe biatrial enlargement, no more than moderate AS.  Last cath in 2/22 showed nonobstructive mild CAD, elevated right and left heart filling pressures with low CI 2.04; AS no more than moderate.  Cause of cardiomyopathy uncertain, he is generally in NSR so unlikely to be tachy-mediated CMP.  Possible remote viral infection/viral myocarditis.  NYHA class IV at admission, volume status much improved today with CVP 8 and weight down another 8 lbs.  Co-ox 63% on milrinone 0.375. Low output HF.  He is on Lasix gtt 12 mg/hr + tolvaptan 15 mg yesterday.   - Decrease milrinone down to 0.125 today for RHC today.   - Stop Lasix today, RHC later today and will decide on diuretic dosing afterwards.  - Continue digoxin 0.125 daily, level 0.3 today.  - Replace K.  - Continue Farxiga and spironolactone 25 mg daily.  - Continue losartan 12.5 mg daily.  - Long-term, concerned about his trajectory.  Has been doing poorly at home.  Suspect low output HF, weaning milrinone to 0.125 today and will get formal RHC (discussed risks/benefits with patient and he agrees to procedure).  Long discussion with patient yesterday about LVAD.  He is thinking about it.  Will have patient with LVAD come by and talk with him today. Creatinine ok, I do have concern about his RV (moderate dysfunction). 2. Atrial fibrillation: Paroxysmal.  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  Off Tikosyn after VF on 4/22.  Did not tolerate amiodarone in the past but doing well on it so far.  - Continue amiodarone 400 mg daily for another week then 200 mg daily.  - Continue Eliquis.  - Replace K and Mg.  3. Aortic stenosis: Based on full evaluation (cath and  echo), suspect no more than moderate aortic stenosis.  I do not think he would benefit from TAVR at this point.  4. 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  - statin.  5. VF: VF terminated by ICD shock on 4/22.  Milrinone decreased to 0.25.  Tikosyn stopped and amiodarone gtt started.  - Continue amiodarone as above.  - Replace K  6. Hyponatremia: Hypervolemic hyponatremia. Na up to 134 with tolvaptan 15 mg yesterday. - Keep fluid restricted.   Mobilize with cardiac rehab.   Length of Stay: 7  [name], MD  10/05/2020, 8:25 AM  Advanced Heart Failure Team Pager [phone] (M-F; 7a - 5p)  Please contact [facility] for night-coverage after hours (5p -7a ) and weekends on [email or web address]

## 2020-10-05 NOTE — Progress Notes (Signed)
PT Cancellation Note  Patient Details Name: Chad Wilkerson MRN: [id number] DOB: 12/27/1945   Cancelled Treatment:    Reason Eval/Treat Not Completed: Patient at procedure or test/unavailable Pt off floor at cardiac cath. Will follow.   Blake Divine A Tiran Sauseda 10/05/2020, 1:09 PM [place], PT, DPT Acute Rehabilitation Services Pager [phone] Office [phone]

## 2020-10-05 NOTE — Interval H&P Note (Signed)
History and Physical Interval Note:  10/05/2020 12:39 PM  Chad Wilkerson  has presented today for surgery, with the diagnosis of heart failure.  The various methods of treatment have been discussed with the patient and family. After consideration of risks, benefits and other options for treatment, the patient has consented to  Procedure(s): RIGHT HEART CATH (N/A) as a surgical intervention.  The patient's history has been reviewed, patient examined, no change in status, stable for surgery.  I have reviewed the patient's chart and labs.  Questions were answered to the patient's satisfaction.     Markelle Asaro [employer]

## 2020-10-05 NOTE — Progress Notes (Signed)
Telemetry reviewed with Dr. Phineas Douglas SR 60's, NSVT only and frequency/burden is less. Continue amiodarone 400mg  daily to complete a week then reduce to 200mg  daily  EP will sign off though remain available, please recall if needed.  , PA-C

## 2020-10-05 NOTE — Progress Notes (Signed)
  Speech Language Pathology Treatment: Dysphagia  Patient Details Name: Chad Wilkerson MRN: [id number] DOB: 1946-06-12 Today's Date: 10/05/2020 Time: 7340-3709 SLP Time Calculation (min) (ACUTE ONLY): 12 min  Assessment / Plan / Recommendation Clinical Impression  Pt was seen while eating his lunch tray in a partially reclined position, limited in his positioning as he had recently returned from his cardiac cath. Pt does not present with symptoms of dysphagia, and he has no overt s/s of aspiration. He still attributes his self-reported coughing episodes to moments of eating too quickly, and he also says that it happens infrequently. Recommend continuing with regular solids and thin liquids using general aspiration precautions, of which pt was educated. Will sign off acutely.    HPI HPI: Pt is a 75 y.o. male with medical history significant for chronic combined systolic and diastolic CHF (EF less than 20%, G3 DD by TTE 09/28/2020), NICM, s/p ICD, atrial fibrillation on Eliquis and Tikosyn, DM2, HTN, and OSA on BiPAP. Pt presented to the ED for evaluation of shortness of breath and weight gain. CXR 4/19: Cardiomegaly with mild interstitial pulmonary edema. SLP consulted secondary to "coughing spell with medication administration" and pt's report to RN of frequent coughing with swallowing since admission.      SLP Plan  All goals met       Recommendations  Diet recommendations: Regular;Thin liquid Liquids provided via: Cup;Straw Medication Administration: Whole meds with puree Supervision: Patient able to self feed;Intermittent supervision to cue for compensatory strategies Compensations: Slow rate;Small sips/bites Postural Changes and/or Swallow Maneuvers: Seated upright 90 degrees                Oral Care Recommendations: Oral care BID Follow up Recommendations: None SLP Visit Diagnosis: Dysphagia, unspecified (R13.10) Plan: All goals met       GO                Osie Bond., M.A. [place] [facility] Pager [phone] Office [phone]  10/05/2020, 2:56 PM

## 2020-10-05 NOTE — Progress Notes (Addendum)
Patient ID: Chad Wilkerson, male   DOB: July 17, 1945, 75 y.o.   MRN: [id number]     Advanced Heart Failure Rounding Note  PCP-Cardiologist: Christell Constant, MD   Subjective:    Good diuresis again yesterday with Lasix gtt 12 mg/hr + tolvaptan 15 mg x 1, weight down 8 lbs.  Creatinine stable at 1.1.  SBP 90s-110s.  Co-ox 63% on milrinone 0.375.  CVP down to 8 today.     VF with ICD shock on 4/22.  Tikosyn stopped and amiodarone gtt started.  No further VT/VF but still with PVCs.     Objective:   Weight Range: 96.4 kg Body mass index is 31.86 kg/m.   Vital Signs:   Temp:  [97.5 F (36.4 C)-98.5 F (36.9 C)] 97.5 F (36.4 C) (04/26 0435) Pulse Rate:  [63-73] 73 (04/26 0435) Resp:  [17-20] 20 (04/26 0435) BP: (96-101)/(64-76) 101/76 (04/26 0435) SpO2:  [90 %-96 %] 92 % (04/26 0435) Weight:  [96.4 kg-100 kg] 96.4 kg (04/26 0433) Last BM Date: 10/02/20  Weight change: Filed Weights   10/04/20 0427 10/04/20 1306 10/05/20 0433  Weight: 100 kg 100 kg 96.4 kg    Intake/Output:   Intake/Output Summary (Last 24 hours) at 10/05/2020 0825 Last data filed at 10/05/2020 0814 Gross per 24 hour  Intake 1326.77 ml  Output 8225 ml  Net -6898.23 ml      Physical Exam    General: NAD Neck: JVP 8 cm, no thyromegaly or thyroid nodule.  Lungs: Clear to auscultation bilaterally with normal respiratory effort. CV: Nondisplaced PMI.  Heart regular S1/S2, no S3/S4, 1/6 SEM RUSB.  No peripheral edema.   Abdomen: Soft, nontender, no hepatosplenomegaly, no distention.  Skin: Intact without lesions or rashes.  Neurologic: Alert and oriented x 3.  Psych: Normal affect. Extremities: No clubbing or cyanosis.  HEENT: Normal.    Telemetry   NSR, occasional PVCs (personally reviewed)   Labs    CBC Recent Labs    10/04/20 0419 10/05/20 0418  WBC 7.5 8.7  NEUTROABS  --  6.4  HGB 13.3 15.4  HCT 40.6 45.1  MCV 86.9 85.9  PLT 246 293   Basic Metabolic Panel Recent Labs     10/04/20 0419 10/04/20 1701 10/04/20 2140 10/05/20 0418  NA 124*   < > 128* 134*  K 3.0*   < > 2.9* 3.4*  CL 84*   < > 88* 92*  CO2 29   < > 29 31  GLUCOSE 378*   < > 287* 157*  BUN 28*   < > 32* 32*  CREATININE 1.15   < > 1.14 1.10  CALCIUM 8.7*   < > 9.0 9.6  MG 2.1  --   --  2.2  PHOS  --   --   --  4.8*   < > = values in this interval not displayed.   Liver Function Tests Recent Labs    10/05/20 0418  AST 58*  ALT 60*  ALKPHOS 108  BILITOT 2.2*  PROT 8.1  ALBUMIN 3.8   No results for input(s): LIPASE, AMYLASE in the last 72 hours. Cardiac Enzymes No results for input(s): CKTOTAL, CKMB, CKMBINDEX, TROPONINI in the last 72 hours.  BNP: BNP (last 3 results) Recent Labs    09/28/20 1613  BNP 3,328.4*    ProBNP (last 3 results) Recent Labs    04/01/20 1509  PROBNP 5,001*     D-Dimer No results for input(s): DDIMER in the last 72  hours. Hemoglobin A1C No results for input(s): HGBA1C in the last 72 hours. Fasting Lipid Panel No results for input(s): CHOL, HDL, LDLCALC, TRIG, CHOLHDL, LDLDIRECT in the last 72 hours. Thyroid Function Tests No results for input(s): TSH, T4TOTAL, T3FREE, THYROIDAB in the last 72 hours.  Invalid input(s): FREET3  Other results:   Imaging    No results found.   Medications:     Scheduled Medications: . amiodarone  400 mg Oral Daily  . atorvastatin  20 mg Oral Daily  . Chlorhexidine Gluconate Cloth  6 each Topical Daily  . dapagliflozin propanediol  10 mg Oral Daily  . digoxin  0.125 mg Oral Daily  . docusate sodium  100 mg Oral BID  . escitalopram  10 mg Oral Daily  . insulin aspart  0-9 Units Subcutaneous TID WC  . losartan  12.5 mg Oral Daily  . mometasone-formoterol  2 puff Inhalation BID  . polyethylene glycol  17 g Oral BID  . sodium chloride flush  10-40 mL Intracatheter Q12H  . sodium chloride flush  3 mL Intravenous Q12H  . sodium chloride flush  3 mL Intravenous Q12H  . spironolactone  25 mg Oral  Daily    Infusions: . sodium chloride    . sodium chloride    . sodium chloride 10 mL/hr at 10/05/20 0454  . milrinone      PRN Medications: sodium chloride, acetaminophen **OR** acetaminophen, ondansetron **OR** ondansetron (ZOFRAN) IV, polyethylene glycol, sodium chloride flush, sodium chloride flush   Assessment/Plan   1. Acute on chronic systolic CHF: Long-standing NICM, followed in the past in [place].  Has MDT ICD.  Echo 09/28/20 with EF <20%, severe LV dilation, mild RV dilation with moderate RV dysfunction, mild-moderate MR, moderate-severe TR, severe biatrial enlargement, no more than moderate AS.  Last cath in 2/22 showed nonobstructive mild CAD, elevated right and left heart filling pressures with low CI 2.04; AS no more than moderate.  Cause of cardiomyopathy uncertain, he is generally in NSR so unlikely to be tachy-mediated CMP.  Possible remote viral infection/viral myocarditis.  NYHA class IV at admission, volume status much improved today with CVP 8 and weight down another 8 lbs.  Co-ox 63% on milrinone 0.375. Low output HF (lowest observed co-ox was 56%, correlating with CI 1.5).  He is on Lasix gtt 12 mg/hr + tolvaptan 15 mg yesterday.   - Decrease milrinone down to 0.125 today for RHC today.   - Stop Lasix today, RHC later today and will decide on diuretic dosing afterwards.  - Continue digoxin 0.125 daily, level 0.3 today.  - Replace K.  - Continue Farxiga and spironolactone 25 mg daily.  - Continue losartan 12.5 mg daily.  - Long-term, concerned about his trajectory.  Has been doing poorly at home.  Suspect low output HF, weaning milrinone to 0.125 today and will get formal RHC (discussed risks/benefits with patient and he agrees to procedure).  Long discussion with patient yesterday about LVAD.  He is thinking about it.  Will have patient with LVAD come by and talk with him today. Creatinine ok, I do have concern about his RV (moderate dysfunction). 2. Atrial  fibrillation: Paroxysmal.  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 Off Tikosyn after VF on 4/22.  Did not tolerate amiodarone in the past but doing well on it so far.  - Continue amiodarone 400 mg daily for another week then 200 mg daily.  - Continue Eliquis.  - Replace K and Mg.  3. Aortic stenosis: Based on full evaluation (cath and echo), suspect no more than moderate aortic stenosis.  I do not think he would benefit from TAVR at this point.  4. 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  - statin.  5. VF: VF terminated by ICD shock on 4/22.  Milrinone decreased to 0.25.  Tikosyn stopped and amiodarone gtt started.  - Continue amiodarone as above.  - Replace K  6. Hyponatremia: Hypervolemic hyponatremia. Na up to 134 with tolvaptan 15 mg yesterday. - Keep fluid restricted.   Mobilize with cardiac rehab.   Length of Stay: 7  [name], MD  10/05/2020, 8:25 AM  Advanced Heart Failure Team Pager [phone] (M-F; 7a - 5p)  Please contact [facility] for night-coverage after hours (5p -7a ) and weekends on [email or web address]

## 2020-10-05 NOTE — Care Management Important Message (Signed)
Important Message  Patient Details  Name: Chad Wilkerson MRN: [id number] Date of Birth: 1946/03/29   Medicare Important Message Given:  Yes     Renie Ora 10/05/2020, 8:29 AM

## 2020-10-05 NOTE — Progress Notes (Signed)
Patient had his home unit with him.  Plugged in and placed at bedside for patient and put sterile water in machine for patient.

## 2020-10-05 NOTE — Progress Notes (Signed)
PROGRESS NOTE    Chad Wilkerson  HER:[id number] DOB: 1946-01-31 DOA: 09/28/2020 PCP: [name], NP    Chief Complaint  Patient presents with  . Shortness of Breath   Brief Narrative:  Chad Wilkerson is Chad Wilkerson 75 y.o. male with medical history significant for chronic combined systolic and diastolic CHF (EF less than 20%, G3 DD by TTE 09/28/2020), NICM, s/p ICD, atrial fibrillation on Eliquis and Tikosyn, T2DM, HTN, and OSA on BiPAP who presented to the ED for evaluation of shortness of breath and weightgain.  Patient reports 1 week of progressive dyspnea, DOE, orthopnea weight gain.  He was seen in follow-up in his cardiology office earlier today.  He underwent echocardiogram which showed EF <20%.  He had significant shortness of breath therefore was sent to the ED for further management.  He's been admitted with HF exacerbation.  He's on milrinone and lasix per cardiology.  Plan for RHC today.  See below.  Assessment & Plan:   Principal Problem:   Acute on chronic combined systolic (congestive) and diastolic (congestive) heart failure (HCC) Active Problems:   Sleep apnea   Paroxysmal atrial fibrillation (HCC)   ICD (implantable cardioverter-defibrillator) in place  Chad Wilkerson is Chad Wilkerson 75 y.o. male with medical history significant for chronic combined systolic and diastolic CHF (EF less than 20%, G3 DD by TTE 09/28/2020), NICM, s/p ICD, atrial fibrillation on Eliquis and Tikosyn, T2DM, HTN, and OSA on BiPAP who is admitted with acute on chronic combined systolic and diastolic CHF.  Acute on chronic combined systolic and diastolic CHF s/p ICD: TTE 4/19 prior to admit showed EF <20% with G3 DD. Also moderately reduced RVSF.   Cardiology following and managing diuresis.  At risk for low output heart failure and therefore started on milrinone. -Cardiology following - appreciate assistance -> milrinone per cards, lasix gtt per cards (on hold until after RHC), farxiga,  spironolactone, losartan, digoxin - s/p RHC 4/26 -> with good cardiac output and PAPI on milrinone, elevated L>R heart filling preasures, moderate pulmonary venous hypertension -> planning to continue milrinone, restart lasix gtt and continue vad discussions - Note from LVAD nurse 4/26 - pt not with accurate interpretation of VAD -> they're considering having Chad Wilkerson VAD pt speak with Chad Wilkerson.  Planning for 2:30 today, follow.   -Monitor strict I/O's and daily weights  Ventricular Fibrillation  - run of vfib this PM s/p shock from ICD - continue amiodarone per cards -> 400 mg PO daily x1 week, then 200 mg daily.  Continue milrinone for now. - holding tikosyn at this time - appreciate cards assistance  Hyponatremia Improved.  Corrects at times with hyperglycemia. S/p tolvaptan per cards on 4/25  Hypokalemia Replace aggressively and follow  Hypotension: Holding home Coreg.  On milrinone as above.  IV lasix.  Spironolactone.  Losartan  Paroxysmal atrial fibrillation: Continue Eliquis and amiodarone.  Holding Coreg.  Type 2 diabetes: Hold home metformin.  Continue farxiga.  SSI.   OSA: Continue BiPAP nightly.  Elevated LFT's Continue to monitor  DVT prophylaxis: eliquis Code Status: DNR Family Communication: none at bedside Disposition:   Status is: Inpatient  Remains inpatient appropriate because:Inpatient level of care appropriate due to severity of illness   Dispo: The patient is from: home              Anticipated d/c is to: pending              Patient currently is not medically stable to d/c.  Difficult to place patient No       Consultants:   cardiology  Procedures: Echo IMPRESSIONS    1. Left ventricular ejection fraction, by estimation, is <20%. The left  ventricle has severely decreased function. The left ventricle demonstrates  global hypokinesis. The left ventricular internal cavity size was severely  dilated. Left ventricular  diastolic  parameters are consistent with Grade III diastolic dysfunction  (restrictive).  2. Right ventricular systolic function is moderately reduced. The right  ventricular size is mildly enlarged. There is normal pulmonary artery  systolic pressure.  3. Left atrial size was severely dilated.  4. Right atrial size was severely dilated.  5. The mitral valve is grossly normal. Mild to moderate mitral valve  regurgitation.  6. Tricuspid valve regurgitation is moderate to severe.  7. The aortic valve is grossly normal. Aortic valve regurgitation is not  visualized.  8. Aortic dilatation noted. There is mild dilatation of the ascending  aorta, measuring 41 mm.  Right heart cath 1. Good cardiac output and PAPI on milrinone.  2. Elevated L>R heart filling pressures.  3. Moderate pulmonary venous hypertension.   Will continue milrinone 0.25 for now and restart Lasix gtt, will need further diuresis.  Continue LVAD discussions.   Antimicrobials: Anti-infectives (From admission, onward)   None         Subjective: No new complaints  Objective: Vitals:   10/05/20 0435 10/05/20 0830 10/05/20 1055 10/05/20 1220  BP: 101/76  101/70   Pulse: 73  72   Resp: 20  20   Temp: (!) 97.5 F (36.4 C)  (!) 97.4 F (36.3 C)   TempSrc: Axillary  Oral   SpO2: 92% 93% 93% 97%  Weight:      Height:        Intake/Output Summary (Last 24 hours) at 10/05/2020 1448 Last data filed at 10/05/2020 1100 Gross per 24 hour  Intake 1326.77 ml  Output 8375 ml  Net -7048.23 ml   Filed Weights   10/04/20 0427 10/04/20 1306 10/05/20 0433  Weight: 100 kg 100 kg 96.4 kg    Examination:  General: No acute distress. Cardiovascular: Heart sounds show Chad Wilkerson regular rate, and rhythm. Lungs: Clear to auscultation bilaterally  Abdomen: Soft, nontender, nondistended  Neurological: Alert and oriented 3. Moves all extremities 4 . Cranial nerves II through XII grossly intact. Skin: Warm and dry. No rashes or  lesions. Extremities: No clubbing or cyanosis. No edema.   Data Reviewed: I have personally reviewed following labs and imaging studies  CBC: Recent Labs  Lab 09/28/20 1612 09/29/20 0040 10/01/20 0450 10/02/20 0447 10/03/20 0523 10/04/20 0419 10/05/20 0418 10/05/20 1256 10/05/20 1257  WBC 8.9   < > 9.3 9.5 8.7 7.5 8.7  --   --   NEUTROABS 6.8  --   --   --   --   --  6.4  --   --   HGB 15.0   < > 14.2 13.8 14.2 13.3 15.4 16.3 16.0  HCT 45.8   < > 43.1 41.6 42.5 40.6 45.1 48.0 47.0  MCV 89.3   < > 88.5 87.6 87.1 86.9 85.9  --   --   PLT 257   < > 231 253 267 246 293  --   --    < > = values in this interval not displayed.    Basic Metabolic Panel: Recent Labs  Lab 10/01/20 1443 10/01/20 2216 10/02/20 0447 10/02/20 1642 10/03/20 1700 10/04/20 0419 10/04/20 1701 10/04/20 2140  10/05/20 0418 10/05/20 1256 10/05/20 1257  NA 135   < > 131*   < > 129* 124* 131* 128* 134* 138 138  K 2.6*   < > 3.2*   < > 3.0* 3.0* 3.3* 2.9* 3.4* 3.6 3.5  CL 89*   < > 90*   < > 86* 84* 85* 88* 92*  --   --   CO2 33*   < > 32   < > 28 29 31 29 31   --   --   GLUCOSE 187*   < > 234*   < > 296* 378* 194* 287* 157*  --   --   BUN 14   < > 19   < > 30* 28* 31* 32* 32*  --   --   CREATININE 0.94   < > 0.93   < > 1.40* 1.15 1.22 1.14 1.10  --   --   CALCIUM 9.3   < > 8.9   < > 9.0 8.7* 9.3 9.0 9.6  --   --   MG 2.2  --  2.1  --  2.2 2.1  --   --  2.2  --   --   PHOS  --   --   --   --   --   --   --   --  4.8*  --   --    < > = values in this interval not displayed.    GFR: Estimated Creatinine Clearance: 66.9 mL/min (by C-G formula based on SCr of 1.1 mg/dL).  Liver Function Tests: Recent Labs  Lab 09/28/20 1612 10/05/20 0418  AST 33 58*  ALT 25 60*  ALKPHOS 77 108  BILITOT 1.5* 2.2*  PROT 6.9 8.1  ALBUMIN 3.6 3.8    CBG: Recent Labs  Lab 10/04/20 0603 10/04/20 1115 10/04/20 1603 10/05/20 0635 10/05/20 1057  GLUCAP 160* 224* 196* 161* 144*     Recent Results (from the  past 240 hour(s))  Resp Panel by RT-PCR (Flu Akane Tessier&B, Covid) Nasopharyngeal Swab     Status: None   Collection Time: 09/28/20  4:13 PM   Specimen: Nasopharyngeal Swab; Nasopharyngeal(NP) swabs in vial transport medium  Result Value Ref Range Status   SARS Coronavirus 2 by RT PCR NEGATIVE NEGATIVE Final    Comment: (NOTE) SARS-CoV-2 target nucleic acids are NOT DETECTED.  The SARS-CoV-2 RNA is generally detectable in upper respiratory specimens during the acute phase of infection. The lowest concentration of SARS-CoV-2 viral copies this assay can detect is 138 copies/mL. Tashunda Vandezande negative result does not preclude SARS-Cov-2 infection and should not be used as the sole basis for treatment or other patient management decisions. Kiva Norland negative result may occur with  improper specimen collection/handling, submission of specimen other than nasopharyngeal swab, presence of viral mutation(s) within the areas targeted by this assay, and inadequate number of viral copies(<138 copies/mL). Shoshanah Dapper negative result must be combined with clinical observations, patient history, and epidemiological information. The expected result is Negative.  Fact Sheet for Patients:  09/30/20  Fact Sheet for Healthcare Providers:  BloggerCourse.com  This test is no t yet approved or cleared by the [place] FDA and  has been authorized for detection and/or diagnosis of SARS-CoV-2 by FDA under an Emergency Use Authorization (EUA). This EUA will remain  in effect (meaning this test can be used) for the duration of the COVID-19 declaration under Section 564(b)(1) of the Act, 21 U.S.C.section 360bbb-3(b)(1), unless the authorization is terminated  or  revoked sooner.       Influenza Trinita Devlin by PCR NEGATIVE NEGATIVE Final   Influenza B by PCR NEGATIVE NEGATIVE Final    Comment: (NOTE) The Xpert Xpress SARS-CoV-2/FLU/RSV plus assay is intended as an aid in the diagnosis of  influenza from Nasopharyngeal swab specimens and should not be used as Tiffny Gemmer sole basis for treatment. Nasal washings and aspirates are unacceptable for Xpert Xpress SARS-CoV-2/FLU/RSV testing.  Fact Sheet for Patients: BloggerCourse.com  Fact Sheet for Healthcare Providers: SeriousBroker.it  This test is not yet approved or cleared by the [place] FDA and has been authorized for detection and/or diagnosis of SARS-CoV-2 by FDA under an Emergency Use Authorization (EUA). This EUA will remain in effect (meaning this test can be used) for the duration of the COVID-19 declaration under Section 564(b)(1) of the Act, 21 U.S.C. section 360bbb-3(b)(1), unless the authorization is terminated or revoked.  Performed at [facility], [address], [place], [place] [address]          Radiology Studies: CARDIAC CATHETERIZATION  Result Date: 10/05/2020 1. Good cardiac output and PAPI on milrinone. 2. Elevated L>R heart filling pressures. 3. Moderate pulmonary venous hypertension. Will continue milrinone 0.25 for now and restart Lasix gtt, will need further diuresis.  Continue LVAD discussions.        Scheduled Meds: . amiodarone  400 mg Oral Daily  . apixaban  5 mg Oral BID  . atorvastatin  20 mg Oral Daily  . Chlorhexidine Gluconate Cloth  6 each Topical Daily  . dapagliflozin propanediol  10 mg Oral Daily  . digoxin  0.125 mg Oral Daily  . docusate sodium  100 mg Oral BID  . escitalopram  10 mg Oral Daily  . insulin aspart  0-9 Units Subcutaneous TID WC  . losartan  12.5 mg Oral Daily  . mometasone-formoterol  2 puff Inhalation BID  . polyethylene glycol  17 g Oral BID  . potassium chloride  40 mEq Oral Q4H  . sodium chloride flush  10-40 mL Intracatheter Q12H  . sodium chloride flush  3 mL Intravenous Q12H  . sodium chloride flush  3 mL Intravenous Q12H  . sodium chloride flush  3 mL Intravenous Q12H  .  spironolactone  25 mg Oral Daily   Continuous Infusions: . sodium chloride    . sodium chloride 10 mL/hr at 10/05/20 0454  . sodium chloride    . furosemide (LASIX) 200 mg in dextrose 5% 100 mL (2mg /mL) infusion 12 mg/hr (10/05/20 1430)  . milrinone 0.25 mcg/kg/min (10/05/20 1419)     LOS: 7 days    Time spent: over 30 min    10/07/20, MD [facility]   To contact the attending provider between 7A-7P or the covering provider during after hours 7P-7A, please log into the web site [email or web address] and access using universal [place] password for that web site. If you do not have the password, please call the hospital operator.  10/05/2020, 2:48 PM

## 2020-10-05 NOTE — Progress Notes (Signed)
VAD coordinator met with pt and SO yesterday to discuss VAD. Before our meeting the pt was provided with the following written materials:  VAD educational packet including "Understanding Your Options with Advanced Heart Failure", "[place] Patient Agreement for VAD Evaluation and Potential Implantation" consent, and [facility] "Living a More Active Life" HM III booklet", "[place] HM III Patient Education", "[place] Mechanical Circulatory Support Program", and "Decision Aids for Left Ventricular Assist Device" reviewed in detail and left at bedside for continued reference.   I attempted to discuss VAD with the pt but wasn't able to speak very much as the pt dominated the conversation and cut me off several times. Pt does not have an accurate interpretation of the VAD. I offered to have a VAD pt and his wife meet with Mr. Abruzzo and his SO. The pt initially declined this offer but after he spoke with DR. Mclean yesterday he would like to meet with a pt. I have arranged for this meeting today at 2:30 if the pt is out of his [place] in time. If not we will have to reschedule for Wednesday. VAD team is holding off on any evaluation at this time as this process has not been explained to the pt as the pt has not been open to the conversation.                                              Total Session Time: 30 minutes  Tanda Rockers, RN VAD Coordinator   Office: [phone] 24/7 VAD Pager: [phone]

## 2020-10-05 NOTE — Progress Notes (Signed)
SLP Cancellation Note  Patient Details Name: ZANE PELLECCHIA MRN: [id number] DOB: 1945-09-21   Cancelled treatment:       Reason Eval/Treat Not Completed: Patient at procedure or test/unavailable (cath lab - and had been NPO throughout the morning in preparation for procedure). Will f/u as able.    Mahala Menghini., M.A. CCC-SLP Acute Rehabilitation Services Pager [phone] Office [phone]  10/05/2020, 1:08 PM

## 2020-10-05 NOTE — Progress Notes (Signed)
  Mobility Specialist Criteria Algorithm Info.  Mobility Team: [facility] elevated:Self regulated Activity: Ambulated in hall; Dangled on edge of bed Range of motion: Active; All extremities Level of assistance: Standby assist, set-up cues, supervision of patient - no hands on Assistive device: Cane Minutes sitting in chair:  Minutes stood: 5 minutes Minutes ambulated: 5 minutes Distance ambulated (ft): 400 ft Mobility response: Tolerated well Bed Position: Semi-fowlers  Patient agreed to participate in mobility this morning. While talking to patient at approximately 0950 his pacemaker alarmed but did not shock. Pt denied feeling abnormal, VSS and HR in the 60's. RN was notified prior to ambulation. Ambulated in hallway 400 feet with cane at supervision. He tolerated ambulation well without incident or complaint and is now sitting EOB with all needs met.    10/05/2020 2:33 PM

## 2020-10-06 ENCOUNTER — Other Ambulatory Visit (HOSPITAL_COMMUNITY): Payer: Self-pay | Admitting: *Deleted

## 2020-10-06 ENCOUNTER — Inpatient Hospital Stay (HOSPITAL_COMMUNITY): Payer: Medicare Other

## 2020-10-06 DIAGNOSIS — Z01810 Encounter for preprocedural cardiovascular examination: Secondary | ICD-10-CM | POA: Diagnosis not present

## 2020-10-06 DIAGNOSIS — I5043 Acute on chronic combined systolic (congestive) and diastolic (congestive) heart failure: Secondary | ICD-10-CM | POA: Diagnosis not present

## 2020-10-06 LAB — GLUCOSE, CAPILLARY
Glucose-Capillary: 175 mg/dL — ABNORMAL HIGH (ref 70–99)
Glucose-Capillary: 185 mg/dL — ABNORMAL HIGH (ref 70–99)
Glucose-Capillary: 149 mg/dL — ABNORMAL HIGH (ref 70–99)
Glucose-Capillary: 172 mg/dL — ABNORMAL HIGH (ref 70–99)

## 2020-10-06 LAB — CBC
HCT: 47.2 % (ref 39.0–52.0)
Hemoglobin: 15.5 g/dL (ref 13.0–17.0)
MCH: 28.8 pg (ref 26.0–34.0)
MCHC: 32.8 g/dL (ref 30.0–36.0)
MCV: 87.6 fL (ref 80.0–100.0)
Platelets: 298 10*3/uL (ref 150–400)
RBC: 5.39 MIL/uL (ref 4.22–5.81)
RDW: 15.9 % — ABNORMAL HIGH (ref 11.5–15.5)
WBC: 8.6 10*3/uL (ref 4.0–10.5)
nRBC: 0 % (ref 0.0–0.2)

## 2020-10-06 LAB — BASIC METABOLIC PANEL
Anion gap: 11 (ref 5–15)
Anion gap: 11 (ref 5–15)
BUN: 32 mg/dL — ABNORMAL HIGH (ref 8–23)
BUN: 27 mg/dL — ABNORMAL HIGH (ref 8–23)
CO2: 30 mmol/L (ref 22–32)
CO2: 29 mmol/L (ref 22–32)
Calcium: 9.1 mg/dL (ref 8.9–10.3)
Calcium: 8.9 mg/dL (ref 8.9–10.3)
Chloride: 93 mmol/L — ABNORMAL LOW (ref 98–111)
Chloride: 93 mmol/L — ABNORMAL LOW (ref 98–111)
Creatinine, Ser: 1 mg/dL (ref 0.61–1.24)
Creatinine, Ser: 1.14 mg/dL (ref 0.61–1.24)
GFR, Estimated: >60 mL/min (ref >60)
GFR, Estimated: >60 mL/min (ref >60)
Glucose, Bld: 211 mg/dL — ABNORMAL HIGH (ref 70–99)
Glucose, Bld: 179 mg/dL — ABNORMAL HIGH (ref 70–99)
Potassium: 3.3 mmol/L — ABNORMAL LOW (ref 3.5–5.1)
Potassium: 3.2 mmol/L — ABNORMAL LOW (ref 3.5–5.1)
Sodium: 134 mmol/L — ABNORMAL LOW (ref 135–145)
Sodium: 133 mmol/L — ABNORMAL LOW (ref 135–145)

## 2020-10-06 LAB — HEPATIC FUNCTION PANEL
ALT: 93 U/L — ABNORMAL HIGH (ref 0–44)
AST: 89 U/L — ABNORMAL HIGH (ref 15–41)
Albumin: 3.5 g/dL (ref 3.5–5.0)
Alkaline Phosphatase: 115 U/L (ref 38–126)
Bilirubin, Direct: 0.4 mg/dL — ABNORMAL HIGH (ref 0.0–0.2)
Indirect Bilirubin: 1.2 mg/dL — ABNORMAL HIGH (ref 0.3–0.9)
Total Bilirubin: 1.6 mg/dL — ABNORMAL HIGH (ref 0.3–1.2)
Total Protein: 7.5 g/dL (ref 6.5–8.1)

## 2020-10-06 LAB — BLOOD GAS, ARTERIAL
Acid-Base Excess: 3.7 mmol/L — ABNORMAL HIGH (ref 0.0–2.0)
Bicarbonate: 27.3 mmol/L (ref 20.0–28.0)
Drawn by: 336832
FIO2: 21
O2 Saturation: 94.7 %
Patient temperature: 37
pCO2 arterial: 38.2 mmHg (ref 32.0–48.0)
pH, Arterial: 7.467 — ABNORMAL HIGH (ref 7.350–7.450)
pO2, Arterial: 71.4 mmHg — ABNORMAL LOW (ref 83.0–108.0)

## 2020-10-06 LAB — COOXEMETRY PANEL
Carboxyhemoglobin: 1.3 % (ref 0.5–1.5)
Methemoglobin: 0.6 % (ref 0.0–1.5)
O2 Saturation: 68.1 %
Total hemoglobin: 15.9 g/dL (ref 12.0–16.0)

## 2020-10-06 LAB — PHOSPHORUS: Phosphorus: 4.2 mg/dL (ref 2.5–4.6)

## 2020-10-06 LAB — MAGNESIUM: Magnesium: 2.1 mg/dL (ref 1.7–2.4)

## 2020-10-06 MED ORDER — TORSEMIDE 20 MG PO TABS
80.0000 mg | ORAL_TABLET | Freq: Two times a day (BID) | ORAL | Status: DC
  Administered 2020-10-06 – 2020-10-09 (×6): 80 mg via ORAL
  Filled 2020-10-06 (×6): qty 4

## 2020-10-06 MED ORDER — POTASSIUM CHLORIDE CRYS ER 20 MEQ PO TBCR
40.0000 meq | EXTENDED_RELEASE_TABLET | Freq: Two times a day (BID) | ORAL | Status: DC
  Administered 2020-10-06 – 2020-10-08 (×6): 40 meq via ORAL
  Filled 2020-10-06 (×6): qty 2

## 2020-10-06 MED ORDER — POTASSIUM CHLORIDE CRYS ER 20 MEQ PO TBCR
40.0000 meq | EXTENDED_RELEASE_TABLET | Freq: Once | ORAL | Status: AC
  Administered 2020-10-06: 40 meq via ORAL
  Filled 2020-10-06: qty 2

## 2020-10-06 MED ORDER — LOSARTAN POTASSIUM 25 MG PO TABS
12.5000 mg | ORAL_TABLET | Freq: Two times a day (BID) | ORAL | Status: DC
  Administered 2020-10-06 – 2020-10-09 (×6): 12.5 mg via ORAL
  Filled 2020-10-06 (×7): qty 1

## 2020-10-06 NOTE — Progress Notes (Signed)
Carotid duplex bilateral and lower extremity venous study completed.   Please see CV Proc for preliminary results.   Jean Rosenthal, RDMS, RVT

## 2020-10-06 NOTE — Progress Notes (Signed)
PROGRESS NOTE    Chad Wilkerson  PQD:[id number] DOB: 1946-03-02 DOA: 09/28/2020 PCP: [name], NP   Brief Narrative:  HPI on 09/28/2020 by Dr. Ezekiel Slocumb Chad Wilkerson is a 75 y.o. male with medical history significant for chronic combined systolic and diastolic CHF (EF less than 20%, G3 DD by TTE 09/28/2020), NICM, s/p ICD, atrial fibrillation on Eliquis and Tikosyn, T2DM, HTN, and OSA on BiPAP who presents to the ED for evaluation of shortness of breath and weightgain.  Patient reports 1 week of progressive dyspnea, DOE, orthopnea weight gain.  He was seen in follow-up in his cardiology office earlier today.  He underwent echocardiogram which showed EF <20%.  He had significant shortness of breath therefore was sent to the ED for further management.  Patient otherwise denies any chest pain, subjective fevers, chills, diaphoresis, cough, nausea, vomiting, or dysuria.  He has not seen any obvious bleeding.  Interim history Patient admitted with heart failure exacerbation and placed on milrinone as well as Lasix drips.  Heart failure team following.  Status post right heart cath.  Pending eval for LVAD. Assessment & Plan   Acute on chronic combined systolic and diastolic heart failure -Patient presented with shortness of breath and weight gain -Echocardiogram 4/19 showed an EF less than 20% with grade 3 diastolic dysfunction. -Cardiology consulted and appreciated -Currently placed on milrinone as well as Lasix drips -Cardiology planning to decrease milrinone drip today and to discontinue Lasix drip later today and start torsemide 80 mg twice daily this afternoon -Continue digoxin, spironolactone, Farxiga -Losartan increased to 12.5 mg twice daily -Status post right heart catheterization on 09/2620, showing good cardiac output and PA PI on milrinone.  Elevated L> R heart filling pressures.  Moderate pulmonary venous hypertension.  Recommendations were to continue milrinone and  Lasix gtt and LVAD discussions.  Ventricular fibrillation -Patient with ICD in place, ICD shock on 4/22 -Milrinone being decreased -Tikosyn discontinued -Currently on amiodarone  Hyponatremia -Status post tolvaptan per cardiology on 4/25 -Sodium 134 today, continue to monitor BMP  Hypokalemia -Suspect secondary to diuresis -Continue to replace and monitor  Coronary artery disease -Status post heart catheterization, nonobstructive coronary disease -Continue statin  Paroxysmal atrial fibrillation -Currently on Eliquis, amiodarone -Coreg held  Diabetes mellitus, type II -Metformin held -Currently on Farxiga  -continue insulin sliding scale and CBG monitoring  Obstructive sleep apnea -Continue BiPAP nightly  Elevated LFTs -Mildly elevated, will continue to monitor closely   DVT Prophylaxis Eliquis  Code Status: DNR  Family Communication: None at bedside  Disposition Plan:  Status is: Inpatient  Remains inpatient appropriate because:IV treatments appropriate due to intensity of illness or inability to take PO   Dispo: The patient is from: Home              Anticipated d/c is to: Home              Patient currently is not medically stable to d/c.   Difficult to place patient No   Consultants Cardiology  Procedures  Echocardiogram Right heart cath  Antibiotics   Anti-infectives (From admission, onward)   None      Subjective:   Deago Mohabir seen and examined today.  Patient feels breathing has improved significantly.  Denies current chest pain or shortness of breath, abdominal pain, nausea or vomiting, diarrhea constipation, dizziness or headache.  Would like to know the results of his catheterization yesterday and wants more information on LVAD.  Objective:   Vitals:  10/05/20 1938 10/05/20 1950 10/06/20 0346 10/06/20 0733  BP:  110/76 103/74   Pulse:  78 69   Resp:  19 20   Temp:  99.6 F (37.6 C) 98.5 F (36.9 C) (!) 97.5 F (36.4 C)   TempSrc:  Oral Oral Oral  SpO2: 92% 94% 94%   Weight:   95.3 kg   Height:        Intake/Output Summary (Last 24 hours) at 10/06/2020 1028 Last data filed at 10/06/2020 0730 Gross per 24 hour  Intake 347.79 ml  Output 4875 ml  Net -4527.21 ml   Filed Weights   10/04/20 1306 10/05/20 0433 10/06/20 0346  Weight: 100 kg 96.4 kg 95.3 kg    Exam  General: Well developed, elderly, NAD  HEENT: NCAT, mucous membranes moist.   Cardiovascular: S1 S2 auscultated, RRR, SEM  Respiratory: Clear to auscultation bilaterally, no wheezing  Abdomen: Soft, nontender, nondistended, + bowel sounds  Extremities: warm dry without cyanosis clubbing or edema  Neuro: AAOx3, nonfocal  Psych: pleasant, appropriate mood and affect   Data Reviewed: I have personally reviewed following labs and imaging studies  CBC: Recent Labs  Lab 10/02/20 0447 10/03/20 0523 10/04/20 0419 10/05/20 0418 10/05/20 1256 10/05/20 1257 10/06/20 0349  WBC 9.5 8.7 7.5 8.7  --   --  8.6  NEUTROABS  --   --   --  6.4  --   --   --   HGB 13.8 14.2 13.3 15.4 16.3 16.0 15.5  HCT 41.6 42.5 40.6 45.1 48.0 47.0 47.2  MCV 87.6 87.1 86.9 85.9  --   --  87.6  PLT 253 267 246 293  --   --  298   Basic Metabolic Panel: Recent Labs  Lab 10/03/20 1700 10/04/20 0419 10/04/20 1701 10/04/20 2140 10/05/20 0418 10/05/20 1256 10/05/20 1257 10/05/20 1706 10/06/20 0349  NA 129* 124* 131* 128* 134* 138 138 135 134*  K 3.0* 3.0* 3.3* 2.9* 3.4* 3.6 3.5 3.3* 3.3*  CL 86* 84* 85* 88* 92*  --   --  94* 93*  CO2 28 29 31 29 31   --   --  29 30  GLUCOSE 296* 378* 194* 287* 157*  --   --  250* 211*  BUN 30* 28* 31* 32* 32*  --   --  31* 32*  CREATININE 1.40* 1.15 1.22 1.14 1.10  --   --  1.05 1.00  CALCIUM 9.0 8.7* 9.3 9.0 9.6  --   --  9.1 9.1  MG 2.2 2.1  --   --  2.2  --   --  2.3 2.1  PHOS  --   --   --   --  4.8*  --   --   --  4.2   GFR: Estimated Creatinine Clearance: 73.2 mL/min (by C-G formula based on SCr of 1  mg/dL). Liver Function Tests: Recent Labs  Lab 10/05/20 0418 10/06/20 0349  AST 58* 89*  ALT 60* 93*  ALKPHOS 108 115  BILITOT 2.2* 1.6*  PROT 8.1 7.5  ALBUMIN 3.8 3.5   No results for input(s): LIPASE, AMYLASE in the last 168 hours. No results for input(s): AMMONIA in the last 168 hours. Coagulation Profile: No results for input(s): INR, PROTIME in the last 168 hours. Cardiac Enzymes: No results for input(s): CKTOTAL, CKMB, CKMBINDEX, TROPONINI in the last 168 hours. BNP (last 3 results) Recent Labs    04/01/20 1509  PROBNP 5,001*   HbA1C: No results for input(s):  HGBA1C in the last 72 hours. CBG: Recent Labs  Lab 10/05/20 0635 10/05/20 1057 10/05/20 1603 10/05/20 2141 10/06/20 0619  GLUCAP 161* 144* 212* 198* 175*   Lipid Profile: No results for input(s): CHOL, HDL, LDLCALC, TRIG, CHOLHDL, LDLDIRECT in the last 72 hours. Thyroid Function Tests: No results for input(s): TSH, T4TOTAL, FREET4, T3FREE, THYROIDAB in the last 72 hours. Anemia Panel: No results for input(s): VITAMINB12, FOLATE, FERRITIN, TIBC, IRON, RETICCTPCT in the last 72 hours. Urine analysis: No results found for: COLORURINE, APPEARANCEUR, LABSPEC, PHURINE, GLUCOSEU, HGBUR, BILIRUBINUR, KETONESUR, PROTEINUR, UROBILINOGEN, NITRITE, LEUKOCYTESUR Sepsis Labs: @LABRCNTIP (procalcitonin:4,lacticidven:4)  ) Recent Results (from the past 240 hour(s))  Resp Panel by RT-PCR (Flu A&B, Covid) Nasopharyngeal Swab     Status: None   Collection Time: 09/28/20  4:13 PM   Specimen: Nasopharyngeal Swab; Nasopharyngeal(NP) swabs in vial transport medium  Result Value Ref Range Status   SARS Coronavirus 2 by RT PCR NEGATIVE NEGATIVE Final    Comment: (NOTE) SARS-CoV-2 target nucleic acids are NOT DETECTED.  The SARS-CoV-2 RNA is generally detectable in upper respiratory specimens during the acute phase of infection. The lowest concentration of SARS-CoV-2 viral copies this assay can detect is 138 copies/mL. A  negative result does not preclude SARS-Cov-2 infection and should not be used as the sole basis for treatment or other patient management decisions. A negative result may occur with  improper specimen collection/handling, submission of specimen other than nasopharyngeal swab, presence of viral mutation(s) within the areas targeted by this assay, and inadequate number of viral copies(<138 copies/mL). A negative result must be combined with clinical observations, patient history, and epidemiological information. The expected result is Negative.  Fact Sheet for Patients:  09/30/20  Fact Sheet for Healthcare Providers:  BloggerCourse.com  This test is no t yet approved or cleared by the [employer].[place] FDA and  has been authorized for detection and/or diagnosis of SARS-CoV-2 by FDA under an Emergency Use Authorization (EUA). This EUA will remain  in effect (meaning this test can be used) for the duration of the COVID-19 declaration under Section 564(b)(1) of the Act, 21 U.S.C.section 360bbb-3(b)(1), unless the authorization is terminated  or revoked sooner.       Influenza A by PCR NEGATIVE NEGATIVE Final   Influenza B by PCR NEGATIVE NEGATIVE Final    Comment: (NOTE) The Xpert Xpress SARS-CoV-2/FLU/RSV plus assay is intended as an aid in the diagnosis of influenza from Nasopharyngeal swab specimens and should not be used as a sole basis for treatment. Nasal washings and aspirates are unacceptable for Xpert Xpress SARS-CoV-2/FLU/RSV testing.  Fact Sheet for Patients: [place]  Fact Sheet for Healthcare Providers: BloggerCourse.com  This test is not yet approved or cleared by the [employer]ious[employer].[place] FDA and has been authorized for detection and/or diagnosis of SARS-CoV-2 by FDA under an Emergency Use Authorization (EUA). This EUA will remain in effect (meaning this test can  be used) for the duration of the COVID-19 declaration under Section 564(b)(1) of the Act, 21 U.S.C. section 360bbb-3(b)(1), unless the authorization is terminated or revoked.  Performed at [facility], [address] [place], [place]       Radiology Studies: CARDIAC CATHETERIZATION  Result Date: 10/05/2020 1. Good cardiac output and PAPI on milrinone. 2. Elevated L>R heart filling pressures. 3. Moderate pulmonary venous hypertension. Will continue milrinone 0.25 for now and restart Lasix gtt, will need further diuresis.  Continue LVAD discussions.     Scheduled Meds: . amiodarone  400 mg Oral  Daily  . apixaban  5 mg Oral BID  . atorvastatin  20 mg Oral Daily  . Chlorhexidine Gluconate Cloth  6 each Topical Daily  . dapagliflozin propanediol  10 mg Oral Daily  . digoxin  0.125 mg Oral Daily  . docusate sodium  100 mg Oral BID  . escitalopram  10 mg Oral Daily  . insulin aspart  0-9 Units Subcutaneous TID WC  . losartan  12.5 mg Oral BID  . mometasone-formoterol  2 puff Inhalation BID  . polyethylene glycol  17 g Oral BID  . potassium chloride  40 mEq Oral BID  . sodium chloride flush  10-40 mL Intracatheter Q12H  . sodium chloride flush  3 mL Intravenous Q12H  . sodium chloride flush  3 mL Intravenous Q12H  . sodium chloride flush  3 mL Intravenous Q12H  . spironolactone  25 mg Oral Daily  . torsemide  80 mg Oral BID   Continuous Infusions: . sodium chloride    . sodium chloride 10 mL/hr at 10/05/20 1200  . sodium chloride    . milrinone 0.125 mcg/kg/min (10/06/20 0839)     LOS: 8 days   Time Spent in minutes   45 minutes  Lennan Malone D.O. on 10/06/2020 at 10:28 AM  Between 7am to 7pm - Please see pager noted on [email or web address]  After 7pm go to [email or web address]  And look for the night coverage person covering for me after hours  [facility] Office  [phone]

## 2020-10-06 NOTE — Progress Notes (Signed)
Patient has home CPAP for use and will self place when ready.  

## 2020-10-06 NOTE — Progress Notes (Signed)
MCS EDUCATION NOTE:                VAD evaluation consent reviewed and signed by Chad Wilkerson.  Initial VAD teaching completed with pt and caregiver.  After meeting with a VAD pt and his caregiver yesterday, Chad Wilkerson has decided to move forward with the evaluation.   All questions answered regarding VAD implant, hospital stay, and what to expect when discharged home living with a heart pump. Pt identified Chad Wilkerson as his primary caregiver if this therapy should be deemed appropriate for him.  Explained need for 24/7 care when pt is discharged home due to sternal precautions, adaptation to living on support, emotional support, consistent and meticulous exit site care and management, medication adherence and high volume of follow up visits with the VAD Clinic after discharge; both pt and caregiver verbalized understanding of above.   Explained that LVAD can be implanted for two indications in the setting of advanced left ventricular heart failure treatment:  1. Bridge to transplant - used for patients who cannot safely wait for heart transplant without this device.  Or    2. Destination therapy - used for patients until end of life or recovery of heart function.  Patient and caregiver(s) acknowledge that the indication at this point in time for LVAD therapy would be for DT due to age.   Provided brief equipment overview and demonstration with HeartMate III training loop including discussion on the following:   a) power module   b) system controller   c) universal Magazine features editor   d) battery clips   e) Batteries   f)  Perc lock   g) Percutaneous lead   Reviewed and supplied a copy of home inspection check list stressing that only three pronged grounded power outlets can be used for VAD equipment. Pt confirmed home has electrical outlets that will support the equipment along with access working telephone.  Identified the following lifestyle modifications while living on MCS:    1.  No driving for at least three months and then only if doctor gives permission to do so.   2. No tub baths while pump implanted, and shower only when doctor gives permission.   3. No swimming or submersion in water while implanted with pump.   4. No contact sports or engaging in jumping activities.   5. Always have a backup controller, charged spare batteries, and battery clips nearby at all times in case of emergency.   6. Call the doctor or hospital contact person if any change in how the pump sounds, feels, or works.   7. Plan to sleep only when connected to the power module.   8. Do not sleep on your stomach.   9. Keep a backup system controller, charged batteries, battery clips, and flashlight near you during sleep in case of electrical power outage.   10. Exit site care including dressing changes, monitoring for infection, and importance of keeping percutaneous lead stabilized at all times.     One of our current patients and his wife visited with them yesterday to answer questions about living on and caring for someone on MCS to assist with decision making.   Reviewed pictures of VAD drive line, site care, dressing changes, and drive line stabilization including securement attachment device and abdominal binder. Discussed with pt and family that they will be required to purchase dressing supplies as long as patient has the VAD in place.    Intermacs patient survival statistics through December 2021  reviewed with patient and caregiver as follows:                                                The patient understands that from this discussion it does not mean that they will receive the device, but that depends on an extensive evaluation process. The patient is aware of the fact that if at anytime they want to stop the evaluation process they can.  All questions have been answered at this time and contact information was provided should they encounter any further questions.  They are both  agreeable at this time to the evaluation process and will move forward.    Total Session Time: 60 minutes  Chad Adam, RN VAD Coordinator   Office: [phone] 24/7 VAD Pager: [phone]

## 2020-10-06 NOTE — Progress Notes (Signed)
Lower extremity venous bilateral and carotid duplex studies completed.   Please see CV Proc for preliminary results.   Jean Rosenthal, RDMS, RVT

## 2020-10-06 NOTE — Clinical Note (Incomplete)
Patient does not consent to LVAD placement during this hospitalization. States that he will consider it after going home. CT cancelled.

## 2020-10-06 NOTE — Progress Notes (Signed)
Physical Therapy Treatment Patient Details Name: Chad Wilkerson MRN: [id number] DOB: 11/25/1945 Today's Date: 10/06/2020    History of Present Illness Pt is a 75 y.o. male admitted 09/28/20 with SOB and weight gain. 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 TTE 4/19 showed EF <20%. Episode of VF terminated by ICD shock on 4/22. PMH includes CHF, NICM s/p ICD, afib on Eliquis and Tikosyn, DM2, HTN, OSA on BiPAP.    PT Comments    Pt with improved activity tolerance, ambulating for increased distances. Pt denies SOB, however reports being far from baseline. Pt will benefit from continued acute PT POC to improve activity tolerance and to assess stair negotiation next session.   Follow Up Recommendations  Home health PT;Supervision - Intermittent     Equipment Recommendations  None recommended by PT    Recommendations for Other Services       Precautions / Restrictions Precautions Precautions: Fall;Other (comment) Precaution Comments: Watch SpO2 - does not wear O2 baseline (except CPAP overnight) Restrictions Weight Bearing Restrictions: No    Mobility  Bed Mobility               General bed mobility comments: not assessed    Transfers Overall transfer level: Modified independent Equipment used: Straight cane Transfers: Sit to/from Stand Sit to Stand: Modified independent (Device/Increase time)            Ambulation/Gait Ambulation/Gait assistance: Supervision Gait Distance (Feet): 475 Feet Assistive device: Straight cane Gait Pattern/deviations: Step-through pattern Gait velocity: reduced Gait velocity interpretation: 1.31 - 2.62 ft/sec, indicative of limited community ambulator General Gait Details: pt with slowed step-through gait, no significant balance deviations noted   Stairs             Wheelchair Mobility    Modified Rankin (Stroke Patients Only)       Balance Overall balance assessment: Needs assistance Sitting-balance support: No  upper extremity supported;Feet supported Sitting balance-Leahy Scale: Good     Standing balance support: Single extremity supported Standing balance-Leahy Scale: Poor Standing balance comment: reliant on UE support of SPC for dynamic activities                            Cognition Arousal/Alertness: Awake/alert Behavior During Therapy: WFL for tasks assessed/performed Overall Cognitive Status: Within Functional Limits for tasks assessed                                        Exercises      General Comments General comments (skin integrity, edema, etc.): VSS on RA, mild increase in RR noted however sats remain stable      Pertinent Vitals/Pain Pain Assessment: No/denies pain    Home Living                      Prior Function            PT Goals (current goals can now be found in the care plan section) Acute Rehab PT Goals Patient Stated Goal: continue improving activity tolerance Progress towards PT goals: Progressing toward goals    Frequency    Min 3X/week      PT Plan Current plan remains appropriate    Co-evaluation              AM-PAC PT "6 Clicks" Mobility   Outcome Measure  Help  needed turning from your back to your side while in a flat bed without using bedrails?: None Help needed moving from lying on your back to sitting on the side of a flat bed without using bedrails?: None Help needed moving to and from a bed to a chair (including a wheelchair)?: None Help needed standing up from a chair using your arms (e.g., wheelchair or bedside chair)?: None Help needed to walk in hospital room?: A Little Help needed climbing 3-5 steps with a railing? : A Little 6 Click Score: 22    End of Session   Activity Tolerance: Patient tolerated treatment well Patient left: in bed;with call bell/phone within reach Nurse Communication: Mobility status PT Visit Diagnosis: Other abnormalities of gait and mobility (R26.89)      Time: 1554-1610 PT Time Calculation (min) (ACUTE ONLY): 16 min  Charges:  $Therapeutic Activity: 8-22 mins                     Arlyss Gandy, PT, DPT Acute Rehabilitation Pager: [phone]    Arlyss Gandy 10/06/2020, 4:35 PM

## 2020-10-06 NOTE — TOC Progression Note (Signed)
Transition of Care [facility]) - Progression Note    Patient Details  Name: Chad Wilkerson MRN: [id number] Date of Birth: 05-18-1946  [facility] CM/SW Contact  Leone Haven, RN Phone Number: 10/06/2020, 4:21 PM  Clinical Narrative:    Patient conts to be on milrinone, Pam also following along in case needs home milrinone.        Expected Discharge Plan and Services                                                 Social Determinants of Health (SDOH) Interventions Food Insecurity Interventions: Intervention Not Indicated Financial Strain Interventions: Intervention Not Indicated Housing Interventions: Intervention Not Indicated Transportation Interventions: Intervention Not Indicated  Readmission Risk Interventions No flowsheet data found.

## 2020-10-06 NOTE — TOC Progression Note (Signed)
Transition of Care [facility]) - Progression Note    Patient Details  Name: Chad Wilkerson MRN: [id number] Date of Birth: 10-08-1945  Transition of Care [facility]) CM/SW Contact  Leone Haven, RN Phone Number: 10/06/2020, 4:41 PM  Clinical Narrative:    NCM spoke with patient at bedside, offered choice for [facility] services, he states he does not want to choose right now, would like to wait and see if he will need HHRN.  He states he was planning on doing therapy with [facility] program.  NCM left agency list with patient in room.  Jeri Modena is following along also to see if will need home milrinone.         Expected Discharge Plan and Services                                                 Social Determinants of Health (SDOH) Interventions Food Insecurity Interventions: Intervention Not Indicated Financial Strain Interventions: Intervention Not Indicated Housing Interventions: Intervention Not Indicated Transportation Interventions: Intervention Not Indicated  Readmission Risk Interventions No flowsheet data found.

## 2020-10-06 NOTE — Progress Notes (Signed)
Patient ID: Chad Wilkerson, male   DOB: September 18, 1945, 75 y.o.   MRN: [id number]     Advanced Heart Failure Rounding Note  PCP-Cardiologist: Werner Lean, MD   Subjective:    Good diuresis again yesterday, weight down 2 more lbs.  CVP 8-9 this morning.  Co-ox 68% on milrinone 0.125.      VF with ICD shock on 4/22.  Tikosyn stopped and amiodarone gtt started.  No further VT/VF but still with PVCs.    RHC Procedural Findings (on milrinone gtt): Hemodynamics (mmHg) RA mean 10 RV 60/11 PA 64/23, mean 37 PCWP mean 23 Oxygen saturations: PA 74% AO 96% Cardiac Output (Fick) 6.22  Cardiac Index (Fick) 2.89 PVR 2.25 WU Cardiac Output (Thermo) 4.84 Cardiac Index (Thermo) 2.25  PVR 2.89 WU PAPI 4.1   Objective:   Weight Range: 95.3 kg Body mass index is 31.48 kg/m.   Vital Signs:   Temp:  [97.4 F (36.3 C)-99.6 F (37.6 C)] 97.5 F (36.4 C) (04/27 0733) Pulse Rate:  [65-83] 69 (04/27 0346) Resp:  [15-30] 20 (04/27 0346) BP: (92-124)/(64-87) 103/74 (04/27 0346) SpO2:  [89 %-98 %] 94 % (04/27 0346) Weight:  [95.3 kg] 95.3 kg (04/27 0346) Last BM Date: 10/05/20  Weight change: Filed Weights   10/04/20 1306 10/05/20 0433 10/06/20 0346  Weight: 100 kg 96.4 kg 95.3 kg    Intake/Output:   Intake/Output Summary (Last 24 hours) at 10/06/2020 0837 Last data filed at 10/06/2020 0730 Gross per 24 hour  Intake 620.36 ml  Output 4925 ml  Net -4304.64 ml      Physical Exam    General: NAD Neck: JVP 8 cm, no thyromegaly or thyroid nodule.  Lungs: Clear to auscultation bilaterally with normal respiratory effort. CV: Nondisplaced PMI.  Heart regular S1/S2, no S3/S4, 1/6 SEM RUSB.  No peripheral edema.   Abdomen: Soft, nontender, no hepatosplenomegaly, no distention.  Skin: Intact without lesions or rashes.  Neurologic: Alert and oriented x 3.  Psych: Normal affect. Extremities: No clubbing or cyanosis.  HEENT: Normal.    Telemetry   NSR, occasional PVCs  (personally reviewed)   Labs    CBC Recent Labs    10/05/20 0418 10/05/20 1256 10/05/20 1257 10/06/20 0349  WBC 8.7  --   --  8.6  NEUTROABS 6.4  --   --   --   HGB 15.4   < > 16.0 15.5  HCT 45.1   < > 47.0 47.2  MCV 85.9  --   --  87.6  PLT 293  --   --  298   < > = values in this interval not displayed.   Basic Metabolic Panel Recent Labs    10/05/20 0418 10/05/20 1256 10/05/20 1706 10/06/20 0349  NA 134*   < > 135 134*  K 3.4*   < > 3.3* 3.3*  CL 92*  --  94* 93*  CO2 31  --  29 30  GLUCOSE 157*  --  250* 211*  BUN 32*  --  31* 32*  CREATININE 1.10  --  1.05 1.00  CALCIUM 9.6  --  9.1 9.1  MG 2.2  --  2.3 2.1  PHOS 4.8*  --   --  4.2   < > = values in this interval not displayed.   Liver Function Tests Recent Labs    10/05/20 0418 10/06/20 0349  AST 58* 89*  ALT 60* 93*  ALKPHOS 108 115  BILITOT 2.2* 1.6*  PROT  8.1 7.5  ALBUMIN 3.8 3.5   No results for input(s): LIPASE, AMYLASE in the last 72 hours. Cardiac Enzymes No results for input(s): CKTOTAL, CKMB, CKMBINDEX, TROPONINI in the last 72 hours.  BNP: BNP (last 3 results) Recent Labs    09/28/20 1613  BNP 3,328.4*    ProBNP (last 3 results) Recent Labs    04/01/20 1509  PROBNP 5,001*     D-Dimer No results for input(s): DDIMER in the last 72 hours. Hemoglobin A1C No results for input(s): HGBA1C in the last 72 hours. Fasting Lipid Panel No results for input(s): CHOL, HDL, LDLCALC, TRIG, CHOLHDL, LDLDIRECT in the last 72 hours. Thyroid Function Tests No results for input(s): TSH, T4TOTAL, T3FREE, THYROIDAB in the last 72 hours.  Invalid input(s): FREET3  Other results:   Imaging    CARDIAC CATHETERIZATION  Result Date: 10/05/2020 1. Good cardiac output and PAPI on milrinone. 2. Elevated L>R heart filling pressures. 3. Moderate pulmonary venous hypertension. Will continue milrinone 0.25 for now and restart Lasix gtt, will need further diuresis.  Continue LVAD discussions.      Medications:     Scheduled Medications: . amiodarone  400 mg Oral Daily  . apixaban  5 mg Oral BID  . atorvastatin  20 mg Oral Daily  . Chlorhexidine Gluconate Cloth  6 each Topical Daily  . dapagliflozin propanediol  10 mg Oral Daily  . digoxin  0.125 mg Oral Daily  . docusate sodium  100 mg Oral BID  . escitalopram  10 mg Oral Daily  . insulin aspart  0-9 Units Subcutaneous TID WC  . losartan  12.5 mg Oral BID  . mometasone-formoterol  2 puff Inhalation BID  . polyethylene glycol  17 g Oral BID  . potassium chloride  40 mEq Oral BID  . sodium chloride flush  10-40 mL Intracatheter Q12H  . sodium chloride flush  3 mL Intravenous Q12H  . sodium chloride flush  3 mL Intravenous Q12H  . sodium chloride flush  3 mL Intravenous Q12H  . spironolactone  25 mg Oral Daily  . torsemide  80 mg Oral BID    Infusions: . sodium chloride    . sodium chloride 10 mL/hr at 10/05/20 1200  . sodium chloride    . milrinone 0.25 mcg/kg/min (10/06/20 0200)    PRN Medications: sodium chloride, acetaminophen **OR** acetaminophen, ondansetron **OR** ondansetron (ZOFRAN) IV, polyethylene glycol, sodium chloride flush, sodium chloride flush   Assessment/Plan   1. Acute on chronic systolic CHF: Long-standing NICM, followed in the past in [place].  Has MDT ICD.  Echo 09/28/20 with EF <20%, severe LV dilation, mild RV dilation with moderate RV dysfunction, mild-moderate MR, moderate-severe TR, severe biatrial enlargement, no more than moderate AS.  Last cath in 2/22 showed nonobstructive mild CAD, elevated right and left heart filling pressures with low CI 2.04; AS no more than moderate.  Cause of cardiomyopathy uncertain, he is generally in NSR so unlikely to be tachy-mediated CMP.  Possible remote viral infection/viral myocarditis.  NYHA class IV at admission, volume status much improved today with CVP 8-9 and weight down another 2 lbs.  Co-ox 68% on milrinone 0.25. Low output HF (lowest observed  co-ox was 56%, correlating with CI 1.5).  He is on Lasix gtt 12 mg/hr. - Decrease milrinone down to 0.125 today.  - Stop Lasix gtt, start torsemide 80 mg bid starting this afternoon.   - Continue digoxin 0.125 daily.  - Replace K.  - Continue Farxiga and spironolactone 25  mg daily.  - Increase losartan to 12.5 mg bid, follow BP to see if Delene Loll would be option.  - Long-term, concerned about his trajectory.  Has been doing poorly at home.  Low output HF this admission.  RV function on RHC appears adequate with PAPi 4.1, creatinine stable.  We have been discussing LVAD, he has met an LVAD patient and was able to get his questions about the LVAD answered.  Our plan for now will be to try to wean his milrinone.  If he has adequate co-ox off milrinone will let him go home but follow closely, understanding that he will likely have a narrow window for [place] to decide on mechanical support.  If co-ox is inadequate off milrinone, will arrange for home milrinone to give him a break from the hospital.  He thinks he will likely move towards LVAD eventually in that case.  Regardless, he wants to try to get home for at least some time.  2. Atrial fibrillation: Paroxysmal.  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 Off Tikosyn after VF on 4/22.  Did not tolerate amiodarone in the past but doing well on it so far.  - Continue amiodarone 400 mg daily for another week then 200 mg daily.  - Continue Eliquis.  - Replace K and Mg.  3. Aortic stenosis: Based on full evaluation (cath and echo), suspect no more than moderate aortic stenosis.  I do not think he would benefit from TAVR at this point.  4. 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  - statin.  5. VF: VF terminated by ICD shock on 4/22.  Milrinone decreased to 0.25.  Tikosyn stopped and amiodarone gtt started.  - Continue amiodarone as above.  - Replace K  6. Hyponatremia: Hypervolemic hyponatremia.  - Keep fluid restricted.   Mobilize with cardiac rehab.    Length of Stay: 8  Loralie Champagne, MD  10/06/2020, 8:37 AM  Advanced Heart Failure Team Pager [phone] (M-F; 7a - 5p)  Please contact [facility] for night-coverage after hours (5p -7a ) and weekends on [email or web address]

## 2020-10-07 ENCOUNTER — Ambulatory Visit: Payer: Medicare Other | Admitting: Pulmonary Disease

## 2020-10-07 ENCOUNTER — Inpatient Hospital Stay (HOSPITAL_COMMUNITY): Payer: Medicare Other

## 2020-10-07 DIAGNOSIS — G4733 Obstructive sleep apnea (adult) (pediatric): Secondary | ICD-10-CM | POA: Diagnosis not present

## 2020-10-07 DIAGNOSIS — Z7189 Other specified counseling: Secondary | ICD-10-CM | POA: Diagnosis not present

## 2020-10-07 DIAGNOSIS — I5043 Acute on chronic combined systolic (congestive) and diastolic (congestive) heart failure: Secondary | ICD-10-CM | POA: Diagnosis not present

## 2020-10-07 DIAGNOSIS — Z515 Encounter for palliative care: Secondary | ICD-10-CM | POA: Diagnosis not present

## 2020-10-07 DIAGNOSIS — I480 Paroxysmal atrial fibrillation: Secondary | ICD-10-CM | POA: Diagnosis not present

## 2020-10-07 DIAGNOSIS — Z95810 Presence of automatic (implantable) cardiac defibrillator: Secondary | ICD-10-CM | POA: Diagnosis not present

## 2020-10-07 LAB — CBC
HCT: 46.3 % (ref 39.0–52.0)
Hemoglobin: 15.2 g/dL (ref 13.0–17.0)
MCH: 28.8 pg (ref 26.0–34.0)
MCHC: 32.8 g/dL (ref 30.0–36.0)
MCV: 87.7 fL (ref 80.0–100.0)
Platelets: 302 10*3/uL (ref 150–400)
RBC: 5.28 MIL/uL (ref 4.22–5.81)
RDW: 15.9 % — ABNORMAL HIGH (ref 11.5–15.5)
WBC: 8.7 10*3/uL (ref 4.0–10.5)
nRBC: 0 % (ref 0.0–0.2)

## 2020-10-07 LAB — BASIC METABOLIC PANEL
Anion gap: 9 (ref 5–15)
BUN: 25 mg/dL — ABNORMAL HIGH (ref 8–23)
CO2: 28 mmol/L (ref 22–32)
Calcium: 8.7 mg/dL — ABNORMAL LOW (ref 8.9–10.3)
Chloride: 95 mmol/L — ABNORMAL LOW (ref 98–111)
Creatinine, Ser: 1 mg/dL (ref 0.61–1.24)
GFR, Estimated: >60 mL/min (ref >60)
Glucose, Bld: 175 mg/dL — ABNORMAL HIGH (ref 70–99)
Potassium: 3.6 mmol/L (ref 3.5–5.1)
Sodium: 132 mmol/L — ABNORMAL LOW (ref 135–145)

## 2020-10-07 LAB — HEPATITIS PANEL, ACUTE
HCV Ab: NONREACTIVE
Hep A IgM: NONREACTIVE
Hep B C IgM: NONREACTIVE
Hepatitis B Surface Ag: NONREACTIVE

## 2020-10-07 LAB — COOXEMETRY PANEL
Carboxyhemoglobin: 1.4 % (ref 0.5–1.5)
Methemoglobin: 0.9 % (ref 0.0–1.5)
O2 Saturation: 70.4 %
Total hemoglobin: 15.5 g/dL (ref 12.0–16.0)

## 2020-10-07 LAB — URIC ACID: Uric Acid, Serum: 8.8 mg/dL — ABNORMAL HIGH (ref 3.7–8.6)

## 2020-10-07 LAB — HEPATITIS B SURFACE ANTIGEN
Hepatitis B Surface Ag: NONREACTIVE
Hepatitis B Surface Ag: NONREACTIVE

## 2020-10-07 LAB — GLUCOSE, CAPILLARY
Glucose-Capillary: 171 mg/dL — ABNORMAL HIGH (ref 70–99)
Glucose-Capillary: 168 mg/dL — ABNORMAL HIGH (ref 70–99)
Glucose-Capillary: 145 mg/dL — ABNORMAL HIGH (ref 70–99)

## 2020-10-07 LAB — PSA: Prostatic Specific Antigen: 2.4 ng/mL (ref 0.00–4.00)

## 2020-10-07 LAB — HEPATIC FUNCTION PANEL
ALT: 143 U/L — ABNORMAL HIGH (ref 0–44)
AST: 150 U/L — ABNORMAL HIGH (ref 15–41)
Albumin: 3.4 g/dL — ABNORMAL LOW (ref 3.5–5.0)
Alkaline Phosphatase: 129 U/L — ABNORMAL HIGH (ref 38–126)
Bilirubin, Direct: 0.3 mg/dL — ABNORMAL HIGH (ref 0.0–0.2)
Indirect Bilirubin: 1 mg/dL — ABNORMAL HIGH (ref 0.3–0.9)
Total Bilirubin: 1.3 mg/dL — ABNORMAL HIGH (ref 0.3–1.2)
Total Protein: 7.5 g/dL (ref 6.5–8.1)

## 2020-10-07 LAB — T4, FREE: Free T4: 1.17 ng/dL — ABNORMAL HIGH (ref 0.61–1.12)

## 2020-10-07 LAB — LIPID PANEL
Cholesterol: 87 mg/dL (ref 0–200)
HDL: 31 mg/dL — ABNORMAL LOW (ref >40)
LDL Cholesterol: 42 mg/dL (ref 0–99)
Total CHOL/HDL Ratio: 2.8 RATIO
Triglycerides: 68 mg/dL (ref <150)
VLDL: 14 mg/dL (ref 0–40)

## 2020-10-07 LAB — ABO/RH: ABO/RH(D): A POS

## 2020-10-07 LAB — ANTITHROMBIN III: AntiThromb III Func: 97 % (ref 75–120)

## 2020-10-07 LAB — TSH: TSH: 2.953 u[IU]/mL (ref 0.350–4.500)

## 2020-10-07 LAB — HEPATITIS B CORE ANTIBODY, IGM: Hep B C IgM: NONREACTIVE

## 2020-10-07 LAB — HEPATITIS C ANTIBODY: HCV Ab: NONREACTIVE

## 2020-10-07 LAB — HIV ANTIBODY (ROUTINE TESTING W REFLEX): HIV Screen 4th Generation wRfx: NONREACTIVE

## 2020-10-07 LAB — PROTIME-INR
INR: 1.5 — ABNORMAL HIGH (ref 0.8–1.2)
Prothrombin Time: 17.8 seconds — ABNORMAL HIGH (ref 11.4–15.2)

## 2020-10-07 LAB — HEPATITIS B SURFACE ANTIBODY,QUALITATIVE: Hep B S Ab: REACTIVE — AB

## 2020-10-07 LAB — TYPE AND SCREEN
ABO/RH(D): A POS
Antibody Screen: NEGATIVE

## 2020-10-07 LAB — LACTATE DEHYDROGENASE: LDH: 178 U/L (ref 98–192)

## 2020-10-07 LAB — PREALBUMIN: Prealbumin: 16.8 mg/dL — ABNORMAL LOW (ref 18–38)

## 2020-10-07 MED ORDER — ADULT MULTIVITAMIN W/MINERALS CH
1.0000 | ORAL_TABLET | Freq: Every day | ORAL | Status: DC
  Administered 2020-10-07 – 2020-10-09 (×3): 1 via ORAL
  Filled 2020-10-07 (×3): qty 1

## 2020-10-07 MED ORDER — AMIODARONE HCL 200 MG PO TABS
200.0000 mg | ORAL_TABLET | Freq: Every day | ORAL | Status: DC
  Administered 2020-10-07 – 2020-10-09 (×3): 200 mg via ORAL
  Filled 2020-10-07 (×3): qty 1

## 2020-10-07 NOTE — Progress Notes (Addendum)
PROGRESS NOTE    Chad Wilkerson  ZOX:[id number] DOB: 09/05/1945 DOA: 09/28/2020 PCP: Shirline FreesNafziger, Cory, NP   Brief Narrative:  HPI on 09/28/2020 by Dr. Ezekiel SlocumbVishal Patel Arlan H Roselle is a 75 y.o. male with medical history significant for chronic combined systolic and diastolic CHF (EF less than 20%, G3 DD by TTE 09/28/2020), NICM, s/p ICD, atrial fibrillation on Eliquis and Tikosyn, T2DM, HTN, and OSA on BiPAP who presents to the ED for evaluation of shortness of breath and weightgain.  Patient reports 1 week of progressive dyspnea, DOE, orthopnea weight gain.  He was seen in follow-up in his cardiology office earlier today.  He underwent echocardiogram which showed EF <20%.  He had significant shortness of breath therefore was sent to the ED for further management.  Patient otherwise denies any chest pain, subjective fevers, chills, diaphoresis, cough, nausea, vomiting, or dysuria.  He has not seen any obvious bleeding.  Interim history Patient admitted with heart failure exacerbation and placed on milrinone as well as Lasix drips.  Heart failure team following.  Status post right heart cath.  Pending eval for LVAD. Assessment & Plan   Acute on chronic combined systolic and diastolic heart failure -Patient presented with shortness of breath and weight gain -Echocardiogram 4/19 showed an EF less than 20% with grade 3 diastolic dysfunction. -Cardiology consulted and appreciated -Was placed on milrinone as well as Lasix drips -Status post right heart catheterization on 10/05/20, showing good cardiac output and PAPI on milrinone.  Elevated L> R heart filling pressures.  Moderate pulmonary venous hypertension.  Recommendations were to continue milrinone and Lasix gtt and LVAD discussions. -Milrinone and Lasix drips discontinued on 6 10/06/2020 -Currently on torsemide 80 mg twice daily -Continue digoxin, spironolactone, Farxiga -Losartan increased to 12.5 mg twice daily -Continue to monitor  intake and output, daily weights -CT scan order on 4/27, however has not been completed yet -Pending orthopantogram -Lower extremity Doppler as well as carotid ultrasound unremarkable  Ventricular fibrillation -Patient with ICD in place, ICD shock on 4/22 -Milrinone being decreased -Tikosyn discontinued -Currently on amiodarone  Hyponatremia -Status post tolvaptan per cardiology on 4/25 -Sodium 132 today, continue to monitor BMP  Hypokalemia -Suspect secondary to diuresis -Continue to replace and monitor  Coronary artery disease -Status post heart catheterization, nonobstructive coronary disease -will hold statin  Paroxysmal atrial fibrillation -Currently on Eliquis, amiodarone -Coreg held  Diabetes mellitus, type II -Metformin held -Currently on Farxiga  -continue insulin sliding scale and CBG monitoring  Obstructive sleep apnea -Continue BiPAP nightly  Elevated LFTs -AST and ALT continue to trend upward- concerning given amiodarone use -RUQ ultrasound ordered -Will order hepatitis panel   DVT Prophylaxis Eliquis  Code Status: DNR  Family Communication: None at bedside  Disposition Plan:  Status is: Inpatient  Remains inpatient appropriate because:IV treatments appropriate due to intensity of illness or inability to take PO   Dispo: The patient is from: Home              Anticipated d/c is to: Home              Patient currently is not medically stable to d/c.   Difficult to place patient No   Consultants Cardiology  Procedures  Echocardiogram Right heart cath Lower extremity Doppler Carotid Doppler  Antibiotics   Anti-infectives (From admission, onward)   None      Subjective:   Carola Rhinearl Seckman seen and examined today.  Patient denies current chest pain or shortness of breath.  Denies  abdominal pain, nausea, vomiting, diarrhea or constipation, dizziness or headache.  Did not really have his CT scan done yesterday and feels that things are  being shoved down his throat.  Patient is not even sure that he wants any further procedures done.  Objective:   Vitals:   10/06/20 1936 10/06/20 2021 10/06/20 2022 10/07/20 0352  BP:  99/74  94/69  Pulse: 71 71  69  Resp: 13  18 20   Temp:  98.1 F (36.7 C)  97.8 F (36.6 C)  TempSrc:  Oral  Oral  SpO2: 93% 92%  95%  Weight:    96.2 kg  Height:        Intake/Output Summary (Last 24 hours) at 10/07/2020 0927 Last data filed at 10/07/2020 1848 Gross per 24 hour  Intake 1250.1 ml  Output 2925 ml  Net -1674.9 ml   Filed Weights   10/05/20 0433 10/06/20 0346 10/07/20 0352  Weight: 96.4 kg 95.3 kg 96.2 kg   Exam  General: Well developed, well nourished, NAD, appears stated age  HEENT: NCAT,  mucous membranes moist.   Cardiovascular: S1 S2 auscultated, SEM, RRR  Respiratory: Clear to auscultation bilaterally   Abdomen: Soft, nontender, nondistended, + bowel sounds  Extremities: warm dry without cyanosis clubbing or edema  Neuro: AAOx3, deafness, hard of hearing, otherwise nonfocal  Psych: Normal affect and demeanor with intact judgement and insight   Data Reviewed: I have personally reviewed following labs and imaging studies  CBC: Recent Labs  Lab 10/03/20 0523 10/04/20 0419 10/05/20 0418 10/05/20 1256 10/05/20 1257 10/06/20 0349 10/07/20 0500  WBC 8.7 7.5 8.7  --   --  8.6 8.7  NEUTROABS  --   --  6.4  --   --   --   --   HGB 14.2 13.3 15.4 16.3 16.0 15.5 15.2  HCT 42.5 40.6 45.1 48.0 47.0 47.2 46.3  MCV 87.1 86.9 85.9  --   --  87.6 87.7  PLT 267 246 293  --   --  298 302   Basic Metabolic Panel: Recent Labs  Lab 10/03/20 1700 10/04/20 0419 10/04/20 1701 10/05/20 0418 10/05/20 1256 10/05/20 1257 10/05/20 1706 10/06/20 0349 10/06/20 1900 10/07/20 0500  NA 129* 124*   < > 134*   < > 138 135 134* 133* 132*  K 3.0* 3.0*   < > 3.4*   < > 3.5 3.3* 3.3* 3.2* 3.6  CL 86* 84*   < > 92*  --   --  94* 93* 93* 95*  CO2 28 29   < > 31  --   --  29 30 29  28   GLUCOSE 296* 378*   < > 157*  --   --  250* 211* 179* 175*  BUN 30* 28*   < > 32*  --   --  31* 32* 27* 25*  CREATININE 1.40* 1.15   < > 1.10  --   --  1.05 1.00 1.14 1.00  CALCIUM 9.0 8.7*   < > 9.6  --   --  9.1 9.1 8.9 8.7*  MG 2.2 2.1  --  2.2  --   --  2.3 2.1  --   --   PHOS  --   --   --  4.8*  --   --   --  4.2  --   --    < > = values in this interval not displayed.   GFR: Estimated Creatinine Clearance: 73.5 mL/min (by  C-G formula based on SCr of 1 mg/dL). Liver Function Tests: Recent Labs  Lab 10/05/20 0418 10/06/20 0349 10/07/20 0500  AST 58* 89* 150*  ALT 60* 93* 143*  ALKPHOS 108 115 129*  BILITOT 2.2* 1.6* 1.3*  PROT 8.1 7.5 7.5  ALBUMIN 3.8 3.5 3.4*   No results for input(s): LIPASE, AMYLASE in the last 168 hours. No results for input(s): AMMONIA in the last 168 hours. Coagulation Profile: Recent Labs  Lab 10/07/20 0500  INR 1.5*   Cardiac Enzymes: No results for input(s): CKTOTAL, CKMB, CKMBINDEX, TROPONINI in the last 168 hours. BNP (last 3 results) Recent Labs    04/01/20 1509  PROBNP 5,001*   HbA1C: No results for input(s): HGBA1C in the last 72 hours. CBG: Recent Labs  Lab 10/06/20 0619 10/06/20 1151 10/06/20 1619 10/06/20 2110 10/07/20 0602  GLUCAP 175* 185* 149* 172* 171*   Lipid Profile: Recent Labs    10/07/20 0500  CHOL 87  HDL 31*  LDLCALC 42  TRIG 68  CHOLHDL 2.8   Thyroid Function Tests: Recent Labs    10/07/20 0351 10/07/20 0500  TSH 2.953  --   FREET4  --  1.17*   Anemia Panel: No results for input(s): VITAMINB12, FOLATE, FERRITIN, TIBC, IRON, RETICCTPCT in the last 72 hours. Urine analysis: No results found for: COLORURINE, APPEARANCEUR, LABSPEC, PHURINE, GLUCOSEU, HGBUR, BILIRUBINUR, KETONESUR, PROTEINUR, UROBILINOGEN, NITRITE, LEUKOCYTESUR Sepsis Labs: @LABRCNTIP (procalcitonin:4,lacticidven:4)  ) Recent Results (from the past 240 hour(s))  Resp Panel by RT-PCR (Flu A&B, Covid) Nasopharyngeal Swab      Status: None   Collection Time: 09/28/20  4:13 PM   Specimen: Nasopharyngeal Swab; Nasopharyngeal(NP) swabs in vial transport medium  Result Value Ref Range Status   SARS Coronavirus 2 by RT PCR NEGATIVE NEGATIVE Final    Comment: (NOTE) SARS-CoV-2 target nucleic acids are NOT DETECTED.  The SARS-CoV-2 RNA is generally detectable in upper respiratory specimens during the acute phase of infection. The lowest concentration of SARS-CoV-2 viral copies this assay can detect is 138 copies/mL. A negative result does not preclude SARS-Cov-2 infection and should not be used as the sole basis for treatment or other patient management decisions. A negative result may occur with  improper specimen collection/handling, submission of specimen other than nasopharyngeal swab, presence of viral mutation(s) within the areas targeted by this assay, and inadequate number of viral copies(<138 copies/mL). A negative result must be combined with clinical observations, patient history, and epidemiological information. The expected result is Negative.  Fact Sheet for Patients:  09/30/20  Fact Sheet for Healthcare Providers:  BloggerCourse.com  This test is no t yet approved or cleared by the [employer] FDA and  has been authorized for detection and/or diagnosis of SARS-CoV-2 by FDA under an Emergency Use Authorization (EUA). This EUA will remain  in effect (meaning this test can be used) for the duration of the COVID-19 declaration under Section 564(b)(1) of the Act, 21 U.S.C.section 360bbb-3(b)(1), unless the authorization is terminated  or revoked sooner.       Influenza A by PCR NEGATIVE NEGATIVE Final   Influenza B by PCR NEGATIVE NEGATIVE Final    Comment: (NOTE) The Xpert Xpress SARS-CoV-2/FLU/RSV plus assay is intended as an aid in the diagnosis of influenza from Nasopharyngeal swab specimens and should not be used as a sole basis  for treatment. Nasal washings and aspirates are unacceptable for Xpert Xpress SARS-CoV-2/FLU/RSV testing.  Fact Sheet for Patients: [place]  Fact Sheet for Healthcare Providers: BloggerCourse.com  This test is  not yet approved or cleared by the [place] and has been authorized for detection and/or diagnosis of SARS-CoV-2 by FDA under an Emergency Use Authorization (EUA). This EUA will remain in effect (meaning this test can be used) for the duration of the COVID-19 declaration under Section 564(b)(1) of the Act, 21 U.S.C. section 360bbb-3(b)(1), unless the authorization is terminated or revoked.  Performed at [facility], [address] [place], [place] [address]       Radiology Studies: CARDIAC CATHETERIZATION  Result Date: 10/05/2020 1. Good cardiac output and PAPI on milrinone. 2. Elevated L>R heart filling pressures. 3. Moderate pulmonary venous hypertension. Will continue milrinone 0.25 for now and restart Lasix gtt, will need further diuresis.  Continue LVAD discussions.   VAS US CAROTID  Result Date: 10/06/2020 Carotid Arterial Duplex Study Patient Name:  KOSTA SCHNITZLER  Date of Exam:   10/06/2020 Medical Rec #: [id number]          Accession #:    [id number] Date of Birth: Apr 26, 1946           Patient Gender: M Patient Age:   16Y Exam Location:  [facility] Procedure:      VAS US CAROTID Referring Phys: [id number] [facility] --------------------------------------------------------------------------------  Indications:       Pre-operative evaluation for VAD. Risk Factors:      Hypertension, Diabetes. Comparison Study:  No prior study Performing Technologist: Jean Rosenthal RDMS,RVT  Examination Guidelines: A complete evaluation includes B-mode imaging, spectral Doppler, color Doppler, and power Doppler as needed of all accessible portions of each vessel. Bilateral testing is considered an integral  part of a complete examination. Limited examinations for reoccurring indications may be performed as noted.  Right Carotid Findings: +----------+--------+--------+--------+-----------------------+--------+           PSV cm/sEDV cm/sStenosisPlaque Description     Comments +----------+--------+--------+--------+-----------------------+--------+ CCA Prox  46      9                                               +----------+--------+--------+--------+-----------------------+--------+ CCA Distal40      12              heterogenous and smooth         +----------+--------+--------+--------+-----------------------+--------+ ICA Prox  34      12              smooth and heterogenous         +----------+--------+--------+--------+-----------------------+--------+ ICA Distal48      17                                              +----------+--------+--------+--------+-----------------------+--------+ ECA       70      9                                               +----------+--------+--------+--------+-----------------------+--------+ +----------+--------+-------+----------------+-------------------+           PSV cm/sEDV cmsDescribe        Arm Pressure (mmHG) +----------+--------+-------+----------------+-------------------+ [id number]            Multiphasic,  WNL                    +----------+--------+-------+----------------+-------------------+ +---------+--------+--+--------+--+---------+ VertebralPSV cm/s35EDV cm/s11Antegrade +---------+--------+--+--------+--+---------+  Left Carotid Findings: +----------+--------+--------+--------+-----------------------+--------+           PSV cm/sEDV cm/sStenosisPlaque Description     Comments +----------+--------+--------+--------+-----------------------+--------+ CCA Prox  83      18                                              +----------+--------+--------+--------+-----------------------+--------+ CCA  Distal47      13                                              +----------+--------+--------+--------+-----------------------+--------+ ICA Prox  32      9               smooth and heterogenous         +----------+--------+--------+--------+-----------------------+--------+ ICA Distal42      11                                              +----------+--------+--------+--------+-----------------------+--------+ ECA       70      12                                              +----------+--------+--------+--------+-----------------------+--------+ +----------+--------+--------+----------------+-------------------+           PSV cm/sEDV cm/sDescribe        Arm Pressure (mmHG) +----------+--------+--------+----------------+-------------------+ Subclavian119             Multiphasic, WNL                    +----------+--------+--------+----------------+-------------------+ +---------+--------+--+--------+--+---------+ VertebralPSV cm/s41EDV cm/s11Antegrade +---------+--------+--+--------+--+---------+   Summary: Right Carotid: Velocities in the right ICA are consistent with a 1-39% stenosis. Left Carotid: Velocities in the left ICA are consistent with a 1-39% stenosis. Vertebrals:  Bilateral vertebral arteries demonstrate antegrade flow. Subclavians: Normal flow hemodynamics were seen in bilateral subclavian              arteries. *See table(s) above for measurements and observations.  Electronically signed by Coral Else MD on 10/06/2020 at 6:16:40 PM.    Final    VAS [place] LOWER EXTREMITY VENOUS (DVT)  Result Date: 10/06/2020  Lower Venous DVT Study Patient Name:  HAREL REPETTO  Date of Exam:   10/06/2020 Medical Rec #: [id number]          Accession #:    [id number] Date of Birth: 30-Apr-1946           Patient Gender: M Patient Age:   71Y Exam Location:  [facility] Procedure:      VAS [place] LOWER EXTREMITY VENOUS (DVT) Referring Phys: [id number] [facility]  --------------------------------------------------------------------------------  Indications: Pre-op, and VAD evaluation.  Comparison Study: No prior study Performing Technologist: Jean Rosenthal RDMS,RVT  Examination Guidelines: A complete evaluation includes B-mode imaging, spectral Doppler, color Doppler, and power Doppler as needed of all accessible portions of each vessel.  Bilateral testing is considered an integral part of a complete examination. Limited examinations for reoccurring indications may be performed as noted. The reflux portion of the exam is performed with the patient in reverse Trendelenburg.  +---------+---------------+---------+-----------+----------+--------------+ RIGHT    CompressibilityPhasicitySpontaneityPropertiesThrombus Aging +---------+---------------+---------+-----------+----------+--------------+ CFV      Full           Yes      Yes                                 +---------+---------------+---------+-----------+----------+--------------+ SFJ      Full                                                        +---------+---------------+---------+-----------+----------+--------------+ FV Prox  Full                                                        +---------+---------------+---------+-----------+----------+--------------+ FV Mid   Full                                                        +---------+---------------+---------+-----------+----------+--------------+ FV DistalFull                                                        +---------+---------------+---------+-----------+----------+--------------+ PFV      Full                                                        +---------+---------------+---------+-----------+----------+--------------+ POP      Full           Yes      Yes                                 +---------+---------------+---------+-----------+----------+--------------+ PTV      Full                                                         +---------+---------------+---------+-----------+----------+--------------+ PERO     Full                                                        +---------+---------------+---------+-----------+----------+--------------+   +---------+---------------+---------+-----------+----------+--------------+ LEFT     CompressibilityPhasicitySpontaneityPropertiesThrombus Aging +---------+---------------+---------+-----------+----------+--------------+  CFV      Full           Yes      Yes                                 +---------+---------------+---------+-----------+----------+--------------+ SFJ      Full                                                        +---------+---------------+---------+-----------+----------+--------------+ FV Prox  Full                                                        +---------+---------------+---------+-----------+----------+--------------+ FV Mid   Full                                                        +---------+---------------+---------+-----------+----------+--------------+ FV DistalFull                                                        +---------+---------------+---------+-----------+----------+--------------+ PFV      Full                                                        +---------+---------------+---------+-----------+----------+--------------+ POP      Full           Yes      Yes                                 +---------+---------------+---------+-----------+----------+--------------+ PTV      Full                                                        +---------+---------------+---------+-----------+----------+--------------+ PERO     Full                                                        +---------+---------------+---------+-----------+----------+--------------+     Summary: RIGHT: - There is no evidence of deep vein thrombosis in the lower  extremity.  - No cystic structure found in the popliteal fossa.  LEFT: - There is no evidence of deep vein thrombosis in the lower extremity.  - No cystic structure found in the popliteal fossa.  *  See table(s) above for measurements and observations. Electronically signed by Coral Else MD on 10/06/2020 at 6:17:32 PM.    Final      Scheduled Meds: . amiodarone  200 mg Oral Daily  . apixaban  5 mg Oral BID  . atorvastatin  20 mg Oral Daily  . Chlorhexidine Gluconate Cloth  6 each Topical Daily  . dapagliflozin propanediol  10 mg Oral Daily  . digoxin  0.125 mg Oral Daily  . docusate sodium  100 mg Oral BID  . escitalopram  10 mg Oral Daily  . insulin aspart  0-9 Units Subcutaneous TID WC  . losartan  12.5 mg Oral BID  . mometasone-formoterol  2 puff Inhalation BID  . polyethylene glycol  17 g Oral BID  . potassium chloride  40 mEq Oral BID  . sodium chloride flush  10-40 mL Intracatheter Q12H  . sodium chloride flush  3 mL Intravenous Q12H  . sodium chloride flush  3 mL Intravenous Q12H  . sodium chloride flush  3 mL Intravenous Q12H  . spironolactone  25 mg Oral Daily  . torsemide  80 mg Oral BID   Continuous Infusions: . sodium chloride    . sodium chloride 10 mL/hr at 10/05/20 1200  . sodium chloride       LOS: 9 days   Time Spent in minutes   30 minutes  Tynisa Vohs D.O. on 10/07/2020 at 9:27 AM  Between 7am to 7pm - Please see pager noted on [email or web address]  After 7pm go to [email or web address]  And look for the night coverage person covering for me after hours  [facility] Office  [phone]

## 2020-10-07 NOTE — Progress Notes (Signed)
Patient refusing to drink contrast for CT. States that he has not decided that he want to move forward with the LVAD yet and that these tests are for that.

## 2020-10-07 NOTE — Progress Notes (Signed)
Physical Therapy Treatment Patient Details Name: Chad Wilkerson MRN: [id number] DOB: 10-Apr-1946 Today's Date: 10/07/2020    History of Present Illness Pt is a 75 y.o. male admitted 09/28/20 with SOB and weight gain. 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 TTE 4/19 showed EF <20%. Episode of VF terminated by ICD shock on 4/22. PMH includes CHF, NICM s/p ICD, afib on Eliquis and Tikosyn, DM2, HTN, OSA on BiPAP.    PT Comments    Pt is agreeable to performing stairs and demonstrates ability to negotiate 3 steps requiring no physical assistance. Pt ambulates a greater distance using SPC compared to last session. Pt reports he feels limited in ambulating longer distances secondary to activity tolerance. Pt oxygen sats taken and found to be at mid-high 90s at end of session. Pt will benefit from continued acute PT to address remaining deficits in strength, endurance, and activity tolerance for independent and safe mobility.    Follow Up Recommendations  Home health PT;Supervision - Intermittent     Equipment Recommendations  None recommended by PT    Recommendations for Other Services       Precautions / Restrictions Precautions Precautions: Fall;Other (comment) Precaution Comments: Watch SpO2 - does not wear O2 baseline (except CPAP overnight) Restrictions Weight Bearing Restrictions: No    Mobility  Bed Mobility Overal bed mobility: Modified Independent             General bed mobility comments: HOB elevated.    Transfers Overall transfer level: Modified independent Equipment used: Straight cane Transfers: Sit to/from Stand Sit to Stand: Modified independent (Device/Increase time)            Ambulation/Gait Ambulation/Gait assistance: Supervision Gait Distance (Feet): 600 Feet Assistive device: Straight cane Gait Pattern/deviations: Step-through pattern Gait velocity: reduced Gait velocity interpretation: 1.31 - 2.62 ft/sec, indicative of limited community  ambulator General Gait Details: Pt demonstrates step through gait with use of SPC, denies SOB.   Stairs Stairs: Yes Stairs assistance: Min guard (min G for safety.) Stair Management: One rail Left;Alternating pattern;With cane Number of Stairs: 3 General stair comments: Pt limited in number of stairs due to length of lines/leads. Pt performs step-ups x10.   Wheelchair Mobility    Modified Rankin (Stroke Patients Only)       Balance Overall balance assessment: Needs assistance Sitting-balance support: No upper extremity supported;Feet supported Sitting balance-Leahy Scale: Good Sitting balance - Comments: No UE support with donning of socks.   Standing balance support: Single extremity supported Standing balance-Leahy Scale: Poor Standing balance comment: SPC use for gait.                            Cognition Arousal/Alertness: Awake/alert Behavior During Therapy: WFL for tasks assessed/performed Overall Cognitive Status: Within Functional Limits for tasks assessed                                        Exercises      General Comments General comments (skin integrity, edema, etc.): VSS on RA. Pt reports limited ability to ambulate longer distances due to feeling winded.      Pertinent Vitals/Pain Pain Assessment: No/denies pain    Home Living                      Prior Function  PT Goals (current goals can now be found in the care plan section) Acute Rehab PT Goals Patient Stated Goal: continue improving activity tolerance Progress towards PT goals: Progressing toward goals    Frequency    Min 3X/week      PT Plan Current plan remains appropriate    Co-evaluation              AM-PAC PT "6 Clicks" Mobility   Outcome Measure  Help needed turning from your back to your side while in a flat bed without using bedrails?: None Help needed moving from lying on your back to sitting on the side of a  flat bed without using bedrails?: None Help needed moving to and from a bed to a chair (including a wheelchair)?: None Help needed standing up from a chair using your arms (e.g., wheelchair or bedside chair)?: None Help needed to walk in hospital room?: A Little Help needed climbing 3-5 steps with a railing? : A Little 6 Click Score: 22    End of Session   Activity Tolerance: Patient tolerated treatment well Patient left: in chair;with call bell/phone within reach;with chair alarm set Nurse Communication: Mobility status PT Visit Diagnosis: Other abnormalities of gait and mobility (R26.89)     Time: 4270-6237 PT Time Calculation (min) (ACUTE ONLY): 21 min  Charges:                        Acute Rehab  Pager: [phone]    Waldemar Dickens, SPT  10/07/2020, 4:28 PM

## 2020-10-07 NOTE — Progress Notes (Signed)
VAD Coordinator met briefly with patient to review any questions/information that the patient might need. Vinie Sill, NP with Palliative Care at bedside completing her VAD evaluation. Patient pleasant, has no questions at this time. Did request copy of VAD consents he signed yesterday, copies of all provided to patient.  He says he may or may not opt for VAD, but needs time to go home and spend quality time with his SO. He says if he should die in the meantime, he is OK and at peace with this. He does agree to come back to clinic as required for frequent check ups/blood work, but thinks July would be the earliest he would consider any advanced therapies.  Reviewed pending exams including CT chest, abdomen, pelvis and PFTs (that is tentatively scheduled for tomorrow at 10:00 am). Pt verbalized understanding of same.   Asked patient to call VAD team if he has any questions re: VAD option. Pt verbalized understanding of same. Dr. Aundra Dubin updated.   Zada Girt RN, [place] Coordinator [phone]

## 2020-10-07 NOTE — Consult Note (Signed)
Consultation Note Date: 10/07/2020   Patient Name: Chad Wilkerson  DOB: 07/09/1945  MRN: [id number]  Age / Sex: 75 y.o., male  PCP: Dorothyann Peng, NP Referring Physician: Cristal Ford, DO  Reason for Consultation: VAD evaluation  HPI/Patient Profile: 75 y.o. male  with past medical history of systolic and diastolic heart failure (EF < 20%, G3DD), NICM, s/p AICD, atrial fibrillation on Eliquis and Tikosyn, hypertension, diabetes, OSA on BiPAP admitted on 09/28/2020 with shortness of breath related to acute on chronic heart failure exacerbation. Ongoing discussion regarding potential VAD and if this is something he would want if offered.   Clinical Assessment and Goals of Care: I met today with [name] and no family at bedside. He is sleeping and cardiac rehab nurse at bedside. He awakens and is initially very frustrated but then he is able to speak and we had a good conversation. He shares with me his goal to return home to his significant other Chad Wilkerson. He has been feeling very pressured to make a decision for LVAD but is not ready to make this decision. We discussed the reasoning behind a decision for LVAD as there is a chance we miss the opportunity to potentially offer him this option if his condition changes. He has full understanding. He has many concerns about the risks vs benefits of the impact on his quality of life and the burden this could place on Rebecca. He does not want to make a rash decision.   He is also very clear that he is fully accepting if he is not a candidate for LVAD and misses his chance to have this intervention. He is more interested in taking his time to consider this option and continue to discuss this at home with Chad Wilkerson. He has a very healthy relationship and view of death. He is not very spiritual but considers himself agnostic but expresses many theories and thoughts on  his theological views. He has put much thought into his end of life (possibly due to his experience as he is a widow) and has full acceptance that he is not meant to live forever. He is very clear that he does not wish to suffer at end of life and would desire to be able to sleep peacefully at the end of his life and hopeful for aggressive medication to ensure his comfort at that time. He also shares that he has documentation for Living Will and HCPOA who is Chad Wilkerson.   Most important to [name] is that he is being listened too and his wishes followed. He is open to LVAD but has not made his final decision. He is appreciative of his medical team taking time to listen to his views and understanding his perspective.   All questions/concerns addressed. Emotional support provided.   Primary Decision Maker PATIENT    SUMMARY OF RECOMMENDATIONS   - He is undecided on his wishes to pursue VAD. He wishes to take the next weeks to months to consider his options. Fully accepting if VAD is  not an option for him in the future. He would like to consider his options and make this decision on his own time.   Code Status/Advance Care Planning:  DNR   Symptom Management:   Per heart failure team.    Psycho-social/Spiritual:   Desire for further Chaplaincy support:no  Prognosis:   To be determined. Dependent on decision and options for advanced therapies.   Discharge Planning: Home with Home Health      Primary Diagnoses: Present on Admission: . Acute on chronic combined systolic (congestive) and diastolic (congestive) heart failure ([place]) . ICD (implantable cardioverter-defibrillator) in place . Sleep apnea . Paroxysmal atrial fibrillation ([facility])   I have reviewed the medical record, interviewed the patient and family, and examined the patient. The following aspects are pertinent.  Past Medical History:  Diagnosis Date  . Adjustment disorder   . Atrial fibrillation ([place])   .  Benign colon polyp   . CHF (congestive heart failure) ([place])   . Diabetes mellitus without complication ([place])   . Diverticulitis   . Essential hypertension   . Heart disease   . Hypogonadism male   . NICM (nonischemic cardiomyopathy) ([place]) 07/20/2020  . Sleep apnea   . Urine incontinence   . Vitamin D deficiency    Social History   Socioeconomic History  . Marital status: Widowed    Spouse name: Not on file  . Number of children: Not on file  . Years of education: Not on file  . Highest education level: Not on file  Occupational History  . Not on file  Tobacco Use  . Smoking status: Former Research scientist (life sciences)  . Smokeless tobacco: Never Used  Vaping Use  . Vaping Use: Never used  Substance and Sexual Activity  . Alcohol use: Not Currently  . Drug use: Not Currently  . Sexual activity: Not on file  Other Topics Concern  . Not on file  Social History Narrative  . Not on file   Social Determinants of Health   Financial Resource Strain: Low Risk   . Difficulty of Paying Living Expenses: Not very hard  Food Insecurity: No Food Insecurity  . Worried About Charity fundraiser in the Last Year: Never true  . Ran Out of Food in the Last Year: Never true  Transportation Needs: No Transportation Needs  . Lack of Transportation (Medical): No  . Lack of Transportation (Non-Medical): No  Physical Activity: Inactive  . Days of Exercise per Week: 0 days  . Minutes of Exercise per Session: 0 min  Stress: No Stress Concern Present  . Feeling of Stress : Not at all  Social Connections: Moderately Isolated  . Frequency of Communication with Friends and Family: Once a week  . Frequency of Social Gatherings with Friends and Family: More than three times a week  . Attends Religious Services: Never  . Active Member of Clubs or Organizations: No  . Attends Archivist Meetings: Never  . Marital Status: Living with partner   Family History  Problem Relation Age of Onset  . Heart attack  Mother   . High Cholesterol Mother   . Asthma Father   . Kidney disease Sister   . Stroke Maternal Grandfather   . Heart attack Paternal Grandfather    Scheduled Meds: . amiodarone  200 mg Oral Daily  . apixaban  5 mg Oral BID  . Chlorhexidine Gluconate Cloth  6 each Topical Daily  . dapagliflozin propanediol  10 mg Oral Daily  . digoxin  0.125 mg Oral Daily  . docusate sodium  100 mg Oral BID  . escitalopram  10 mg Oral Daily  . insulin aspart  0-9 Units Subcutaneous TID WC  . losartan  12.5 mg Oral BID  . mometasone-formoterol  2 puff Inhalation BID  . polyethylene glycol  17 g Oral BID  . potassium chloride  40 mEq Oral BID  . sodium chloride flush  10-40 mL Intracatheter Q12H  . sodium chloride flush  3 mL Intravenous Q12H  . sodium chloride flush  3 mL Intravenous Q12H  . sodium chloride flush  3 mL Intravenous Q12H  . spironolactone  25 mg Oral Daily  . torsemide  80 mg Oral BID   Continuous Infusions: . sodium chloride    . sodium chloride 10 mL/hr at 10/05/20 1200  . sodium chloride     PRN Meds:.sodium chloride, acetaminophen **OR** acetaminophen, ondansetron **OR** ondansetron (ZOFRAN) IV, polyethylene glycol, sodium chloride flush, sodium chloride flush Allergies  Allergen Reactions  . Semaglutide Other (See Comments)    Nausea ,vomiting ,back pain   Review of Systems  Physical Exam Vitals and nursing note reviewed.  Constitutional:      General: He is not in acute distress.    Appearance: He is ill-appearing.  Cardiovascular:     Rate and Rhythm: Normal rate.  Pulmonary:     Effort: No tachypnea, accessory muscle usage or respiratory distress.  Abdominal:     Palpations: Abdomen is soft.  Neurological:     Mental Status: He is alert and oriented to person, place, and time.     Vital Signs: BP 97/71 (BP Location: Left Arm)   Pulse 65   Temp (!) 97.4 F (36.3 C) (Oral)   Resp 19   Ht 5' 8.5" (1.74 m)   Wt 96.2 kg Comment: scale a  SpO2 95%    BMI 31.77 kg/m  Pain Scale: 0-10 POSS *See Group Information*: 1-Acceptable,Awake and alert Pain Score: 0-No pain   SpO2: SpO2: 95 % O2 Device:SpO2: 95 % O2 Flow Rate: .O2 Flow Rate (L/min): 3 L/min  IO: Intake/output summary:   Intake/Output Summary (Last 24 hours) at 10/07/2020 1158 Last data filed at 10/07/2020 1150 Gross per 24 hour  Intake 1250.1 ml  Output 2525 ml  Net -1274.9 ml    LBM: Last BM Date: 10/06/20 Baseline Weight: Weight: 108.8 kg Most recent weight: Weight: 96.2 kg (scale a)     Palliative Assessment/Data:     Time In: 1310 Time Out: 1420 Time Total: 70 min Greater than 50%  of this time was spent counseling and coordinating care related to the above assessment and plan.  Signed by: Vinie Sill, NP Palliative Medicine Team Pager # [phone] (M-F 8a-5p) Team Phone # [phone] (Nights/Weekends)

## 2020-10-07 NOTE — Progress Notes (Addendum)
Patient ID: Chad Wilkerson, male   DOB: December 16, 1945, 75 y.o.   MRN: [id number]     Advanced Heart Failure Rounding Note  PCP-Cardiologist: Chad Lean, MD   Subjective:    Yesterday milrinone cut back to 0.125 and switched to torsemide.   VF with ICD shock on 4/22.  Tikosyn stopped and amiodarone gtt started.  No further VT/VF but still with PVCs.   Feels better today. He is not sure he wants LVAD. Asking questions about HF diet.    RHC Procedural Findings (on milrinone gtt): Hemodynamics (mmHg) RA mean 10 RV 60/11 PA 64/23, mean 37 PCWP mean 23 Oxygen saturations: PA 74% AO 96% Cardiac Output (Fick) 6.22  Cardiac Index (Fick) 2.89 PVR 2.25 WU Cardiac Output (Thermo) 4.84 Cardiac Index (Thermo) 2.25  PVR 2.89 WU PAPI 4.1   Objective:   Weight Range: 96.2 kg Body mass index is 31.77 kg/m.   Vital Signs:   Temp:  [97.8 F (36.6 C)-98.1 F (36.7 C)] 97.8 F (36.6 C) (04/28 0352) Pulse Rate:  [69-71] 69 (04/28 0352) Resp:  [13-20] 20 (04/28 0352) BP: (94-99)/(69-74) 94/69 (04/28 0352) SpO2:  [92 %-95 %] 95 % (04/28 0352) Weight:  [96.2 kg] 96.2 kg (04/28 0352) Last BM Date: 10/06/20  Weight change: Filed Weights   10/05/20 0433 10/06/20 0346 10/07/20 0352  Weight: 96.4 kg 95.3 kg 96.2 kg    Intake/Output:   Intake/Output Summary (Last 24 hours) at 10/07/2020 1055 Last data filed at 10/07/2020 0806 Gross per 24 hour  Intake 1250.1 ml  Output 2925 ml  Net -1674.9 ml      Physical Exam  CVP 5 General:  Well appearing. No resp difficulty HEENT: normal Neck: supple. no JVD. Carotids 2+ bilat; no bruits. No lymphadenopathy or thryomegaly appreciated. Cor: PMI nondisplaced. Regular rate & rhythm. No rubs, gallops or murmurs. Lungs: clear Abdomen: soft, nontender, nondistended. No hepatosplenomegaly. No bruits or masses. Good bowel sounds. Extremities: no cyanosis, clubbing, rash, edema. RUE PICC Neuro: alert & orientedx3, cranial nerves  grossly intact. moves all 4 extremities w/o difficulty. Affect pleasant    Telemetry   NSR with occasional PVCs.    Labs    CBC Recent Labs    10/05/20 0418 10/05/20 1256 10/06/20 0349 10/07/20 0500  WBC 8.7  --  8.6 8.7  NEUTROABS 6.4  --   --   --   HGB 15.4   < > 15.5 15.2  HCT 45.1   < > 47.2 46.3  MCV 85.9  --  87.6 87.7  PLT 293  --  298 302   < > = values in this interval not displayed.   Basic Metabolic Panel Recent Labs    10/05/20 0418 10/05/20 1256 10/05/20 1706 10/06/20 0349 10/06/20 1900 10/07/20 0500  NA 134*   < > 135 134* 133* 132*  K 3.4*   < > 3.3* 3.3* 3.2* 3.6  CL 92*  --  94* 93* 93* 95*  CO2 31  --  29 30 29 28   GLUCOSE 157*  --  250* 211* 179* 175*  BUN 32*  --  31* 32* 27* 25*  CREATININE 1.10  --  1.05 1.00 1.14 1.00  CALCIUM 9.6  --  9.1 9.1 8.9 8.7*  MG 2.2  --  2.3 2.1  --   --   PHOS 4.8*  --   --  4.2  --   --    < > = values in this interval not displayed.  Liver Function Tests Recent Labs    10/06/20 0349 10/07/20 0500  AST 89* 150*  ALT 93* 143*  ALKPHOS 115 129*  BILITOT 1.6* 1.3*  PROT 7.5 7.5  ALBUMIN 3.5 3.4*   No results for input(s): LIPASE, AMYLASE in the last 72 hours. Cardiac Enzymes No results for input(s): CKTOTAL, CKMB, CKMBINDEX, TROPONINI in the last 72 hours.  BNP: BNP (last 3 results) Recent Labs    09/28/20 1613  BNP 3,328.4*    ProBNP (last 3 results) Recent Labs    04/01/20 1509  PROBNP 5,001*     D-Dimer No results for input(s): DDIMER in the last 72 hours. Hemoglobin A1C No results for input(s): HGBA1C in the last 72 hours. Fasting Lipid Panel Recent Labs    10/07/20 0500  CHOL 87  HDL 31*  LDLCALC 42  TRIG 68  CHOLHDL 2.8   Thyroid Function Tests Recent Labs    10/07/20 0351  TSH 2.953    Other results:   Imaging    VAS US CAROTID  Result Date: 10/06/2020 Carotid Arterial Duplex Study Patient Name:  Chad Wilkerson  Date of Exam:   10/06/2020 Medical Rec  #: [id number]          Accession #:    [id number] Date of Birth: 02-02-1946           Patient Gender: M Patient Age:   45Y Exam Location:  [facility] Procedure:      VAS US CAROTID Referring Phys: [id number] [place] --------------------------------------------------------------------------------  Indications:       Pre-operative evaluation for VAD. Risk Factors:      Hypertension, Diabetes. Comparison Study:  No prior study Performing Technologist: Chad Wilkerson RDMS,RVT  Examination Guidelines: A complete evaluation includes B-mode imaging, spectral Doppler, color Doppler, and power Doppler as needed of all accessible portions of each vessel. Bilateral testing is considered an integral part of a complete examination. Limited examinations for reoccurring indications may be performed as noted.  Right Carotid Findings: +----------+--------+--------+--------+-----------------------+--------+           PSV cm/sEDV cm/sStenosisPlaque Description     Comments +----------+--------+--------+--------+-----------------------+--------+ CCA Prox  46      9                                               +----------+--------+--------+--------+-----------------------+--------+ CCA Distal40      12              heterogenous and smooth         +----------+--------+--------+--------+-----------------------+--------+ ICA Prox  34      12              smooth and heterogenous         +----------+--------+--------+--------+-----------------------+--------+ ICA Distal48      17                                              +----------+--------+--------+--------+-----------------------+--------+ ECA       70      9                                               +----------+--------+--------+--------+-----------------------+--------+ +----------+--------+-------+----------------+-------------------+  PSV cm/sEDV cmsDescribe        Arm Pressure (mmHG)  +----------+--------+-------+----------------+-------------------+ [id number]            Multiphasic, WNL                    +----------+--------+-------+----------------+-------------------+ +---------+--------+--+--------+--+---------+ VertebralPSV cm/s35EDV cm/s11Antegrade +---------+--------+--+--------+--+---------+  Left Carotid Findings: +----------+--------+--------+--------+-----------------------+--------+           PSV cm/sEDV cm/sStenosisPlaque Description     Comments +----------+--------+--------+--------+-----------------------+--------+ CCA Prox  83      18                                              +----------+--------+--------+--------+-----------------------+--------+ CCA Distal47      13                                              +----------+--------+--------+--------+-----------------------+--------+ ICA Prox  32      9               smooth and heterogenous         +----------+--------+--------+--------+-----------------------+--------+ ICA Distal42      11                                              +----------+--------+--------+--------+-----------------------+--------+ ECA       70      12                                              +----------+--------+--------+--------+-----------------------+--------+ +----------+--------+--------+----------------+-------------------+           PSV cm/sEDV cm/sDescribe        Arm Pressure (mmHG) +----------+--------+--------+----------------+-------------------+ Subclavian119             Multiphasic, WNL                    +----------+--------+--------+----------------+-------------------+ +---------+--------+--+--------+--+---------+ VertebralPSV cm/s41EDV cm/s11Antegrade +---------+--------+--+--------+--+---------+   Summary: Right Carotid: Velocities in the right ICA are consistent with a 1-39% stenosis. Left Carotid: Velocities in the left ICA are consistent with a  1-39% stenosis. Vertebrals:  Bilateral vertebral arteries demonstrate antegrade flow. Subclavians: Normal flow hemodynamics were seen in bilateral subclavian              arteries. *See table(s) above for measurements and observations.  Electronically signed by Harold Barban MD on 10/06/2020 at 6:16:40 PM.    Final    VAS [place] LOWER EXTREMITY VENOUS (DVT)  Result Date: 10/06/2020  Lower Venous DVT Study Patient Name:  LENA FIELDHOUSE  Date of Exam:   10/06/2020 Medical Rec #: [id number]          Accession #:    [id number] Date of Birth: 06/14/1945           Patient Gender: M Patient Age:   82Y Exam Location:  [facility] Procedure:      VAS [place] LOWER EXTREMITY VENOUS (DVT) Referring Phys: [id number] Kerim Statzer --------------------------------------------------------------------------------  Indications: Pre-op, and VAD evaluation.  Comparison Study: No prior study Performing Technologist: Apolonio Schneiders  Janalyn Harder RDMS,RVT  Examination Guidelines: A complete evaluation includes B-mode imaging, spectral Doppler, color Doppler, and power Doppler as needed of all accessible portions of each vessel. Bilateral testing is considered an integral part of a complete examination. Limited examinations for reoccurring indications may be performed as noted. The reflux portion of the exam is performed with the patient in reverse Trendelenburg.  +---------+---------------+---------+-----------+----------+--------------+ RIGHT    CompressibilityPhasicitySpontaneityPropertiesThrombus Aging +---------+---------------+---------+-----------+----------+--------------+ CFV      Full           Yes      Yes                                 +---------+---------------+---------+-----------+----------+--------------+ SFJ      Full                                                        +---------+---------------+---------+-----------+----------+--------------+ FV Prox  Full                                                         +---------+---------------+---------+-----------+----------+--------------+ FV Mid   Full                                                        +---------+---------------+---------+-----------+----------+--------------+ FV DistalFull                                                        +---------+---------------+---------+-----------+----------+--------------+ PFV      Full                                                        +---------+---------------+---------+-----------+----------+--------------+ POP      Full           Yes      Yes                                 +---------+---------------+---------+-----------+----------+--------------+ PTV      Full                                                        +---------+---------------+---------+-----------+----------+--------------+ PERO     Full                                                        +---------+---------------+---------+-----------+----------+--------------+   +---------+---------------+---------+-----------+----------+--------------+  LEFT     CompressibilityPhasicitySpontaneityPropertiesThrombus Aging +---------+---------------+---------+-----------+----------+--------------+ CFV      Full           Yes      Yes                                 +---------+---------------+---------+-----------+----------+--------------+ SFJ      Full                                                        +---------+---------------+---------+-----------+----------+--------------+ FV Prox  Full                                                        +---------+---------------+---------+-----------+----------+--------------+ FV Mid   Full                                                        +---------+---------------+---------+-----------+----------+--------------+ FV DistalFull                                                         +---------+---------------+---------+-----------+----------+--------------+ PFV      Full                                                        +---------+---------------+---------+-----------+----------+--------------+ POP      Full           Yes      Yes                                 +---------+---------------+---------+-----------+----------+--------------+ PTV      Full                                                        +---------+---------------+---------+-----------+----------+--------------+ PERO     Full                                                        +---------+---------------+---------+-----------+----------+--------------+     Summary: RIGHT: - There is no evidence of deep vein thrombosis in the lower extremity.  - No cystic structure found in the popliteal fossa.  LEFT: - There is no evidence of deep vein thrombosis in the lower extremity.  - No  cystic structure found in the popliteal fossa.  *See table(s) above for measurements and observations. Electronically signed by Harold Barban MD on 10/06/2020 at 45:17:32 PM.    Final      Medications:     Scheduled Medications: . amiodarone  200 mg Oral Daily  . apixaban  5 mg Oral BID  . Chlorhexidine Gluconate Cloth  6 each Topical Daily  . dapagliflozin propanediol  10 mg Oral Daily  . digoxin  0.125 mg Oral Daily  . docusate sodium  100 mg Oral BID  . escitalopram  10 mg Oral Daily  . insulin aspart  0-9 Units Subcutaneous TID WC  . losartan  12.5 mg Oral BID  . mometasone-formoterol  2 puff Inhalation BID  . polyethylene glycol  17 g Oral BID  . potassium chloride  40 mEq Oral BID  . sodium chloride flush  10-40 mL Intracatheter Q12H  . sodium chloride flush  3 mL Intravenous Q12H  . sodium chloride flush  3 mL Intravenous Q12H  . sodium chloride flush  3 mL Intravenous Q12H  . spironolactone  25 mg Oral Daily  . torsemide  80 mg Oral BID    Infusions: . sodium chloride    . sodium  chloride 10 mL/hr at 10/05/20 1200  . sodium chloride      PRN Medications: sodium chloride, acetaminophen **OR** acetaminophen, ondansetron **OR** ondansetron (ZOFRAN) IV, polyethylene glycol, sodium chloride flush, sodium chloride flush   Assessment/Plan   1. Acute on chronic systolic CHF: Long-standing NICM, followed in the past in [place].  Has MDT ICD.  Echo 09/28/20 with EF <20%, severe LV dilation, mild RV dilation with moderate RV dysfunction, mild-moderate MR, moderate-severe TR, severe biatrial enlargement, no more than moderate AS.  Last cath in 2/22 showed nonobstructive mild CAD, elevated right and left heart filling pressures with low CI 2.04; AS no more than moderate.  Cause of cardiomyopathy uncertain, he is generally in NSR so unlikely to be tachy-mediated CMP.  Possible remote viral infection/viral myocarditis. NYHA class IV at admission, volume status much improved today with CVP 5 CO-OX stable at 70%. Stop milrinone.  - Continue torsemide 80 mg bid  - Continue digoxin 0.125 daily.  - Continue Farxiga and spironolactone 25 mg daily.  - Continue losartan 12.5 mg bid, SBP soft.  Hold off on entresto.  - Long-term, concerned about his trajectory.  Has been doing poorly at home.  Low output HF this admission.  RV function on RHC appears adequate with PAPi 4.1, creatinine stable.  We have been discussing LVAD, he has met an LVAD patient and was able to get his questions about the LVAD answered.  Our plan for now will be to try to wean his milrinone.  If he has adequate co-ox off milrinone will let him go home but follow closely, understanding that he will likely have a narrow window for [place] to decide on mechanical support.  If co-ox is inadequate off milrinone, will arrange for home milrinone to give him a break from the hospital.  He thinks he will likely move towards LVAD eventually in that case.  Regardless, he wants to try to get home for at least some time.  2. Atrial  fibrillation: Paroxysmal.  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 Off Tikosyn after VF on 4/22.  Did not tolerate amiodarone in the past but doing well on it so far.  - Continue amiodarone 400 mg daily for another week then 200 mg daily.  -  Continue Eliquis.  - Replace K and Mg.  3. Aortic stenosis: Based on full evaluation (cath and echo), suspect no more than moderate aortic stenosis.  I do not think he would benefit from TAVR at this point.  4. 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  - statin.  5. VF: VF terminated by ICD shock on 4/22.  Milrinone decreased to 0.25.  Tikosyn stopped and amiodarone gtt started.  - Continue amiodarone.  -Supp K 6. Hyponatremia: Hypervolemic hyponatremia 132 today.   - Keep fluid restricted.  7. Elevated LFTs: Noted today.  ?Amiodarone, ?CHF.  Viral hepatitis labs negative.  - Decrease amiodarone to 200 mg daily.  - Abdominal US for liver/gallbladder.   Consult dietitian for HF diet.     Length of Stay: [place], NP  10/07/2020, 10:55 AM  Advanced Heart Failure Team Pager [phone] (M-F; 7a - 5p)  Please contact [facility] for night-coverage after hours (5p -7a ) and weekends on [email or web address]  Patient seen with NP, agree with the above note.   Patient remains on milrinone 0.125, co-ox 70% with CVP 8 on my read.  No complaints today.  LFTs noted to be elevated.   General: NAD Neck: JVP 8 cm, no thyromegaly or thyroid nodule.  Lungs: Clear to auscultation bilaterally with normal respiratory effort. CV: Nondisplaced PMI.  Heart regular S1/S2, no S3/S4, no murmur.  No peripheral edema.   Abdomen: Soft, nontender, no hepatosplenomegaly, no distention.  Skin: Intact without lesions or rashes.  Neurologic: Alert and oriented x 3.  Psych: Normal affect. Extremities: No clubbing or cyanosis.  HEENT: Normal.   Stop milrinone today.  Continue current torsemide, volume looks ok.  No BP room for transition to [facility].  Watch co-ox off  milrinone, if stable will let him go home tomorrow. LVAD workup as above.   Elevated LFTs noted, ?amiodarone versus CHF => may be due to amiodarone as AST/ALT have increased over the last couple days.  Viral hepatitis labs negative.  Abdominal US with possible mild/early cirrhosis.  - Decrease amiodarone to 200 mg daily.  - Follow LFTs closely, may need GI consult if they continue to rise.   Loralie Champagne 10/07/2020 12:29 PM

## 2020-10-07 NOTE — Progress Notes (Signed)
[phone] Came to see to offer walk and give ed materials. Palliative in to see pt also. Pt stated he had walked today. I briefly discussed 2000 mg sodium restriction and left handouts. Pt has dietitian  consult. Also left CHF booklet and reinforced daily weights. Will let Palliative see and continue to follow pt. Luetta Nutting RN BSN 10/07/2020 1:26 PM

## 2020-10-07 NOTE — Progress Notes (Signed)
Initial Nutrition Assessment  DOCUMENTATION CODES:   Obesity unspecified  INTERVENTION:   -MVI with minerals daily -Provided extensive education on heart healthy, carb modified diet. Provided "Heart Healthy, Consistent Carbohydrate Diet" handout from [employer] Nutrition Care Manual  NUTRITION DIAGNOSIS:   Increased nutrient needs related to chronic illness (CHF) as evidenced by estimated needs.  GOAL:   Patient will meet greater than or equal to 90% of their needs  MONITOR:   PO intake,Supplement acceptance,Labs,Weight trends,Skin,I & O's  REASON FOR ASSESSMENT:   Consult LVAD Eval  ASSESSMENT:   Chad Wilkerson is a 75 y.o. male with medical history significant for chronic combined systolic and diastolic CHF (EF less than 20%, G3 DD by TTE 09/28/2020), NICM, s/p ICD, atrial fibrillation on Eliquis and Tikosyn, T2DM, HTN, and OSA on BiPAP who presents to the ED for evaluation of shortness of breath and weightgain.  Pt admitted with CHF.   4/26- s/p rt heart cath  Reviewed I/O's: -2.1 L x 24 hours and -32.6 L since admission  UOP: 31 L x 24 hours  Spoke with pt at bedside, who reports he has a great appetite. He has been consuming 100% of meals.   PTA, pt reports good appetite. 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   Pt denies any weight loss. Pt has experienced a 8.6% wt loss over the past 3 months. Suspect wt loss may be related to diuresis.   RD provided "Heart Healthy, Consistent Carbohydrate Nutrition Therapy" handout from the Academy of [employer]etics. Reviewed patient's dietary recall. Provided examples on ways to decrease sodium intake in diet. Discouraged intake of processed foods and use of salt shaker. Encouraged fresh fruits and vegetables as well as whole grain sources of carbohydrates to maximize fiber intake.   RD discussed why it is important for patient to adhere to diet  recommendations, and emphasized the role of fluids, foods to avoid, and importance of weighing self daily.   Discussed different food groups and their effects on blood sugar, emphasizing carbohydrate-containing foods. Provided list of carbohydrates and recommended serving sizes of common foods.  Discussed importance of controlled and consistent carbohydrate intake throughout the day. Provided examples of ways to balance meals/snacks and encouraged intake of high-fiber, whole grain complex carbohydrates. Teach back method used.  Expect fair to good compliance.  Medications reviewed and include colace, demadex, and miralax.  Labs reviewed: CBGS: 171 (inpatient orders for glycemic control are 10 mg farxiga daily, 0-9 units insulin aspart TID with meals).   NUTRITION - FOCUSED PHYSICAL EXAM:  Flowsheet Row Most Recent Value  Orbital Region No depletion  Upper Arm Region No depletion  Thoracic and Lumbar Region No depletion  Buccal Region No depletion  Temple Region No depletion  Clavicle Bone Region No depletion  Clavicle and Acromion Bone Region No depletion  Scapular Bone Region No depletion  Dorsal Hand No depletion  Patellar Region No depletion  Anterior Thigh Region Mild depletion  Posterior Calf Region Mild depletion  Edema (RD Assessment) None  Hair Reviewed  Eyes Reviewed  Mouth Reviewed  Skin Reviewed  Nails Reviewed       Diet Order:   Diet Order            Diet 2 gram sodium Room service appropriate? Yes; Fluid consistency: Thin; Fluid restriction: 1500 mL Fluid  Diet effective now  EDUCATION NEEDS:   Education needs have been addressed  Skin:  Skin Assessment: Reviewed RN Assessment  Last BM:  10/06/20  Height:   Ht Readings from Last 1 Encounters:  09/29/20 5' 8.5" (1.74 m)    Weight:   Wt Readings from Last 1 Encounters:  10/07/20 96.2 kg    Ideal Body Weight:  71.4 kg  BMI:  Body mass index is 31.77 kg/m.  Estimated  Nutritional Needs:   Kcal:  2150-2350  Protein:  105-120 grams  Fluid:  > 2 L    Levada Schilling, RD, LDN, CDCES Registered Dietitian II Certified Diabetes Care and Education Specialist Please refer to [facility] for RD and/or RD on-call/weekend/after hours pager

## 2020-10-08 ENCOUNTER — Inpatient Hospital Stay (HOSPITAL_COMMUNITY)
Admit: 2020-10-08 | Discharge: 2020-10-08 | Disposition: A | Discharge status: Home / Self Care | Payer: Medicare Other | Attending: Cardiology | Admitting: Cardiology

## 2020-10-08 ENCOUNTER — Inpatient Hospital Stay (HOSPITAL_COMMUNITY): Payer: Medicare Other

## 2020-10-08 DIAGNOSIS — I5043 Acute on chronic combined systolic (congestive) and diastolic (congestive) heart failure: Secondary | ICD-10-CM | POA: Diagnosis not present

## 2020-10-08 DIAGNOSIS — I480 Paroxysmal atrial fibrillation: Secondary | ICD-10-CM | POA: Diagnosis not present

## 2020-10-08 DIAGNOSIS — G4733 Obstructive sleep apnea (adult) (pediatric): Secondary | ICD-10-CM | POA: Diagnosis not present

## 2020-10-08 DIAGNOSIS — Z95810 Presence of automatic (implantable) cardiac defibrillator: Secondary | ICD-10-CM | POA: Diagnosis not present

## 2020-10-08 LAB — PULMONARY FUNCTION TEST
DL/VA % pred: 122 %
DL/VA: 4.93 ml/min/mmHg/L
DLCO cor % pred: 83 %
DLCO cor: 20.19 ml/min/mmHg
DLCO unc % pred: 85 %
DLCO unc: 20.52 ml/min/mmHg
FEF 25-75 Post: 1.76 L/sec
FEF 25-75 Pre: 1.47 L/sec
FEF2575-%Change-Post: 20 %
FEF2575-%Pred-Post: 82 %
FEF2575-%Pred-Pre: 68 %
FEV1-%Change-Post: 4 %
FEV1-%Pred-Post: 60 %
FEV1-%Pred-Pre: 58 %
FEV1-Post: 1.77 L
FEV1-Pre: 1.7 L
FEV1FVC-%Change-Post: -2 %
FEV1FVC-%Pred-Pre: 105 %
FEV6-%Change-Post: 7 %
FEV6-%Pred-Post: 61 %
FEV6-%Pred-Pre: 57 %
FEV6-Post: 2.34 L
FEV6-Pre: 2.18 L
FEV6FVC-%Change-Post: 0 %
FEV6FVC-%Pred-Post: 105 %
FEV6FVC-%Pred-Pre: 105 %
FVC-%Change-Post: 6 %
FVC-%Pred-Post: 58 %
FVC-%Pred-Pre: 55 %
FVC-Post: 2.37 L
FVC-Pre: 2.21 L
Post FEV1/FVC ratio: 75 %
Post FEV6/FVC ratio: 99 %
Pre FEV1/FVC ratio: 77 %
Pre FEV6/FVC Ratio: 99 %
RV % pred: 101 %
RV: 2.47 L
TLC % pred: 69 %
TLC: 4.7 L

## 2020-10-08 LAB — GLUCOSE, CAPILLARY
Glucose-Capillary: 181 mg/dL — ABNORMAL HIGH (ref 70–99)
Glucose-Capillary: 132 mg/dL — ABNORMAL HIGH (ref 70–99)
Glucose-Capillary: 157 mg/dL — ABNORMAL HIGH (ref 70–99)
Glucose-Capillary: 198 mg/dL — ABNORMAL HIGH (ref 70–99)

## 2020-10-08 LAB — COOXEMETRY PANEL
Carboxyhemoglobin: 1.1 % (ref 0.5–1.5)
Carboxyhemoglobin: 1.1 % (ref 0.5–1.5)
Methemoglobin: 0.8 % (ref 0.0–1.5)
Methemoglobin: 0.8 % (ref 0.0–1.5)
O2 Saturation: 52 %
O2 Saturation: 49.1 %
Total hemoglobin: 15.6 g/dL (ref 12.0–16.0)
Total hemoglobin: 15.7 g/dL (ref 12.0–16.0)

## 2020-10-08 LAB — CBC
HCT: 45.9 % (ref 39.0–52.0)
Hemoglobin: 15.2 g/dL (ref 13.0–17.0)
MCH: 28.9 pg (ref 26.0–34.0)
MCHC: 33.1 g/dL (ref 30.0–36.0)
MCV: 87.3 fL (ref 80.0–100.0)
Platelets: 310 10*3/uL (ref 150–400)
RBC: 5.26 MIL/uL (ref 4.22–5.81)
RDW: 15.8 % — ABNORMAL HIGH (ref 11.5–15.5)
WBC: 9.3 10*3/uL (ref 4.0–10.5)
nRBC: 0 % (ref 0.0–0.2)

## 2020-10-08 LAB — COMPREHENSIVE METABOLIC PANEL
ALT: 111 U/L — ABNORMAL HIGH (ref 0–44)
AST: 79 U/L — ABNORMAL HIGH (ref 15–41)
Albumin: 3.5 g/dL (ref 3.5–5.0)
Alkaline Phosphatase: 121 U/L (ref 38–126)
Anion gap: 10 (ref 5–15)
BUN: 25 mg/dL — ABNORMAL HIGH (ref 8–23)
CO2: 29 mmol/L (ref 22–32)
Calcium: 8.9 mg/dL (ref 8.9–10.3)
Chloride: 93 mmol/L — ABNORMAL LOW (ref 98–111)
Creatinine, Ser: 1.05 mg/dL (ref 0.61–1.24)
GFR, Estimated: >60 mL/min (ref >60)
Glucose, Bld: 143 mg/dL — ABNORMAL HIGH (ref 70–99)
Potassium: 3.7 mmol/L (ref 3.5–5.1)
Sodium: 132 mmol/L — ABNORMAL LOW (ref 135–145)
Total Bilirubin: 1.4 mg/dL — ABNORMAL HIGH (ref 0.3–1.2)
Total Protein: 7.6 g/dL (ref 6.5–8.1)

## 2020-10-08 LAB — LUPUS ANTICOAGULANT PANEL
DRVVT: 97.1 s — ABNORMAL HIGH (ref 0.0–47.0)
PTT Lupus Anticoagulant: 49.9 s (ref 0.0–51.9)

## 2020-10-08 LAB — DRVVT CONFIRM: dRVVT Confirm: 1.4 ratio — ABNORMAL HIGH (ref 0.8–1.2)

## 2020-10-08 LAB — HCV AB W REFLEX TO QUANT PCR: HCV Ab: 0.2 s/co ratio (ref 0.0–0.9)

## 2020-10-08 LAB — HCV INTERPRETATION

## 2020-10-08 LAB — DRVVT MIX: dRVVT Mix: 50.1 s — ABNORMAL HIGH (ref 0.0–40.4)

## 2020-10-08 LAB — HEPATITIS B SURFACE ANTIBODY, QUANTITATIVE: Hep B S AB Quant (Post): 55.3 m[IU]/mL (ref >9.9)

## 2020-10-08 MED ORDER — ALBUTEROL SULFATE (2.5 MG/3ML) 0.083% IN NEBU
2.5000 mg | INHALATION_SOLUTION | Freq: Once | RESPIRATORY_TRACT | Status: AC
  Administered 2020-10-08: 2.5 mg via RESPIRATORY_TRACT

## 2020-10-08 MED ORDER — POTASSIUM CHLORIDE CRYS ER 20 MEQ PO TBCR
40.0000 meq | EXTENDED_RELEASE_TABLET | Freq: Once | ORAL | Status: AC
  Administered 2020-10-08: 40 meq via ORAL
  Filled 2020-10-08: qty 2

## 2020-10-08 MED ORDER — METOLAZONE 2.5 MG PO TABS
2.5000 mg | ORAL_TABLET | Freq: Once | ORAL | Status: AC
  Administered 2020-10-08: 2.5 mg via ORAL
  Filled 2020-10-08: qty 1

## 2020-10-08 MED ORDER — MILRINONE LACTATE IN DEXTROSE 20-5 MG/100ML-% IV SOLN
0.2500 ug/kg/min | INTRAVENOUS | Status: DC
  Administered 2020-10-08 (×2): 0.25 ug/kg/min via INTRAVENOUS
  Filled 2020-10-08 (×2): qty 100

## 2020-10-08 NOTE — Progress Notes (Signed)
   10/08/20 0950  Mobility  Activity Off unit; Refused (At procedure)

## 2020-10-08 NOTE — Care Management Important Message (Signed)
Important Message  Patient Details  Name: Chad Wilkerson MRN: [id number] Date of Birth: January 01, 1946   Medicare Important Message Given:  Yes     Renie Ora 10/08/2020, 9:10 AM

## 2020-10-08 NOTE — Progress Notes (Signed)
PROGRESS NOTE    Chad Wilkerson  WUJ:[id number] DOB: 1945-10-14 DOA: 09/28/2020 PCP: [name], NP   Brief Narrative:  HPI on 09/28/2020 by Dr. Ezekiel Slocumb Prien is a 75 y.o. male with medical history significant for chronic combined systolic and diastolic CHF (EF less than 20%, G3 DD by TTE 09/28/2020), NICM, s/p ICD, atrial fibrillation on Eliquis and Tikosyn, T2DM, HTN, and OSA on BiPAP who presents to the ED for evaluation of shortness of breath and weightgain.  Patient reports 1 week of progressive dyspnea, DOE, orthopnea weight gain.  He was seen in follow-up in his cardiology office earlier today.  He underwent echocardiogram which showed EF <20%.  He had significant shortness of breath therefore was sent to the ED for further management.  Patient otherwise denies any chest pain, subjective fevers, chills, diaphoresis, cough, nausea, vomiting, or dysuria.  He has not seen any obvious bleeding.  Interim history Patient admitted with heart failure exacerbation and placed on milrinone as well as Lasix drips.  Heart failure team following.  Status post right heart cath.  Pending eval for LVAD. Assessment & Plan   Acute on chronic combined systolic and diastolic heart failure -Patient presented with shortness of breath and weight gain -Echocardiogram 4/19 showed an EF less than 20% with grade 3 diastolic dysfunction. -Cardiology consulted and appreciated -Was placed on milrinone as well as Lasix drips -Status post right heart catheterization on 10/05/20, showing good cardiac output and PAPI on milrinone.  Elevated L> R heart filling pressures.  Moderate pulmonary venous hypertension.  Recommendations were to continue milrinone and Lasix gtt and LVAD discussions. -Milrinone and Lasix drips discontinued on 10/06/2020 -Currently on torsemide 80 mg twice daily -Continue digoxin, spironolactone, Farxiga -Losartan increased to 12.5 mg twice daily -Continue to monitor intake  and output, daily weights -CT scan order on 4/27, however has not been completed yet -Lower extremity Doppler as well as carotid ultrasound unremarkable -Per cardiology, will continue to monitor patient to see if he needs milrinone at home  Ventricular fibrillation -Patient with ICD in place, ICD shock on 4/22 -Milrinone being decreased -Tikosyn discontinued -Currently on amiodarone  Hyponatremia -Status post tolvaptan per cardiology on 4/25 -Sodium 132 today, continue to monitor BMP  Hypokalemia -Suspect secondary to diuresis -Continue to replace and monitor  Coronary artery disease -Status post heart catheterization, nonobstructive coronary disease -will hold statin  Paroxysmal atrial fibrillation -Currently on Eliquis, amiodarone -Coreg held  Diabetes mellitus, type II -Metformin held -Currently on Farxiga  -continue insulin sliding scale and CBG monitoring  Obstructive sleep apnea -Continue BiPAP nightly  Elevated LFTs -AST and ALT mildly decreased today -concerning given amiodarone use; statin held -RUQ ultrasound: Possible cirrhosis.  No biliary ductal dilatation -Hepatitis panel unremarkable  Goals of care -Patient currently DNR -Palliative care consulted and appreciated   DVT Prophylaxis Eliquis  Code Status: DNR  Family Communication: None at bedside  Disposition Plan:  Status is: Inpatient  Remains inpatient appropriate because:IV treatments appropriate due to intensity of illness or inability to take PO   Dispo: The patient is from: Home              Anticipated d/c is to: Home              Patient currently is not medically stable to d/c.   Difficult to place patient No   Consultants Cardiology  Procedures  Echocardiogram Right heart cath Lower extremity Doppler Carotid Doppler  Antibiotics   Anti-infectives (From admission,  onward)   None      Subjective:   Chad Wilkerson seen and examined today.  Patient states he is  feeling better today.  Denies current shortness of breath or chest pain.  Denies abdominal pain, nausea or vomiting, diarrhea constipation, dizziness or headache.   Objective:   Vitals:   10/07/20 1959 10/08/20 0419 10/08/20 0421 10/08/20 0842  BP: 103/68 (!) 88/65  95/71  Pulse: 73     Resp: 19 18    Temp: 98.2 F (36.8 C)  97.6 F (36.4 C)   TempSrc: Oral  Oral   SpO2: 95%  94%   Weight:   97.1 kg   Height:        Intake/Output Summary (Last 24 hours) at 10/08/2020 0845 Last data filed at 10/08/2020 3154 Gross per 24 hour  Intake 846 ml  Output 3225 ml  Net -2379 ml   Filed Weights   10/06/20 0346 10/07/20 0352 10/08/20 0421  Weight: 95.3 kg 96.2 kg 97.1 kg   Exam  General: Well developed, well nourished, however chronically ill-appearing, NAD  HEENT: NCAT, mucous membranes moist.   Cardiovascular: S1 S2 auscultated, RRR, SEM  Respiratory: Clear to auscultation bilaterally, no wheezing  Abdomen: Soft, nontender, nondistended, + bowel sounds  Extremities: warm dry without cyanosis clubbing or edema  Neuro: AAOx3, hard of hearing, otherwise nonfocal  Psych: pleasant, appropriate mood and affect   Data Reviewed: I have personally reviewed following labs and imaging studies  CBC: Recent Labs  Lab 10/04/20 0419 10/05/20 0418 10/05/20 1256 10/05/20 1257 10/06/20 0349 10/07/20 0500 10/08/20 0500  WBC 7.5 8.7  --   --  8.6 8.7 9.3  NEUTROABS  --  6.4  --   --   --   --   --   HGB 13.3 15.4 16.3 16.0 15.5 15.2 15.2  HCT 40.6 45.1 48.0 47.0 47.2 46.3 45.9  MCV 86.9 85.9  --   --  87.6 87.7 87.3  PLT 246 293  --   --  298 302 310   Basic Metabolic Panel: Recent Labs  Lab 10/03/20 1700 10/04/20 0419 10/04/20 1701 10/05/20 0418 10/05/20 1256 10/05/20 1706 10/06/20 0349 10/06/20 1900 10/07/20 0500 10/08/20 0500  NA 129* 124*   < > 134*   < > 135 134* 133* 132* 132*  K 3.0* 3.0*   < > 3.4*   < > 3.3* 3.3* 3.2* 3.6 3.7  CL 86* 84*   < > 92*  --  94*  93* 93* 95* 93*  CO2 28 29   < > 31  --  29 30 29 28 29   GLUCOSE 296* 378*   < > 157*  --  250* 211* 179* 175* 143*  BUN 30* 28*   < > 32*  --  31* 32* 27* 25* 25*  CREATININE 1.40* 1.15   < > 1.10  --  1.05 1.00 1.14 1.00 1.05  CALCIUM 9.0 8.7*   < > 9.6  --  9.1 9.1 8.9 8.7* 8.9  MG 2.2 2.1  --  2.2  --  2.3 2.1  --   --   --   PHOS  --   --   --  4.8*  --   --  4.2  --   --   --    < > = values in this interval not displayed.   GFR: Estimated Creatinine Clearance: 70.4 mL/min (by C-G formula based on SCr of 1.05 mg/dL). Liver Function Tests:  Recent Labs  Lab 10/05/20 0418 10/06/20 0349 10/07/20 0500 10/08/20 0500  AST 58* 89* 150* 79*  ALT 60* 93* 143* 111*  ALKPHOS 108 115 129* 121  BILITOT 2.2* 1.6* 1.3* 1.4*  PROT 8.1 7.5 7.5 7.6  ALBUMIN 3.8 3.5 3.4* 3.5   No results for input(s): LIPASE, AMYLASE in the last 168 hours. No results for input(s): AMMONIA in the last 168 hours. Coagulation Profile: Recent Labs  Lab 10/07/20 0500  INR 1.5*   Cardiac Enzymes: No results for input(s): CKTOTAL, CKMB, CKMBINDEX, TROPONINI in the last 168 hours. BNP (last 3 results) Recent Labs    04/01/20 1509  PROBNP 5,001*   HbA1C: No results for input(s): HGBA1C in the last 72 hours. CBG: Recent Labs  Lab 10/06/20 2110 10/07/20 0602 10/07/20 1204 10/07/20 1730 10/08/20 0652  GLUCAP 172* 171* 168* 145* 181*   Lipid Profile: Recent Labs    10/07/20 0500  CHOL 87  HDL 31*  LDLCALC 42  TRIG 68  CHOLHDL 2.8   Thyroid Function Tests: Recent Labs    10/07/20 0351 10/07/20 0500  TSH 2.953  --   FREET4  --  1.17*   Anemia Panel: No results for input(s): VITAMINB12, FOLATE, FERRITIN, TIBC, IRON, RETICCTPCT in the last 72 hours. Urine analysis: No results found for: COLORURINE, APPEARANCEUR, LABSPEC, PHURINE, GLUCOSEU, HGBUR, BILIRUBINUR, KETONESUR, PROTEINUR, UROBILINOGEN, NITRITE, LEUKOCYTESUR Sepsis Labs: @LABRCNTIP (procalcitonin:4,lacticidven:4)  ) Recent  Results (from the past 240 hour(s))  Resp Panel by RT-PCR (Flu A&B, Covid) Nasopharyngeal Swab     Status: None   Collection Time: 09/28/20  4:13 PM   Specimen: Nasopharyngeal Swab; Nasopharyngeal(NP) swabs in vial transport medium  Result Value Ref Range Status   SARS Coronavirus 2 by RT PCR NEGATIVE NEGATIVE Final    Comment: (NOTE) SARS-CoV-2 target nucleic acids are NOT DETECTED.  The SARS-CoV-2 RNA is generally detectable in upper respiratory specimens during the acute phase of infection. The lowest concentration of SARS-CoV-2 viral copies this assay can detect is 138 copies/mL. A negative result does not preclude SARS-Cov-2 infection and should not be used as the sole basis for treatment or other patient management decisions. A negative result may occur with  improper specimen collection/handling, submission of specimen other than nasopharyngeal swab, presence of viral mutation(s) within the areas targeted by this assay, and inadequate number of viral copies(<138 copies/mL). A negative result must be combined with clinical observations, patient history, and epidemiological information. The expected result is Negative.  Fact Sheet for Patients:  09/30/20  Fact Sheet for Healthcare Providers:  BloggerCourse.com  This test is no t yet approved or cleared by the [employer] FDA and  has been authorized for detection and/or diagnosis of SARS-CoV-2 by FDA under an Emergency Use Authorization (EUA). This EUA will remain  in effect (meaning this test can be used) for the duration of the COVID-19 declaration under Section 564(b)(1) of the Act, 21 U.S.C.section 360bbb-3(b)(1), unless the authorization is terminated  or revoked sooner.       Influenza A by PCR NEGATIVE NEGATIVE Final   Influenza B by PCR NEGATIVE NEGATIVE Final    Comment: (NOTE) The Xpert Xpress SARS-CoV-2/FLU/RSV plus assay is intended as an aid in the  diagnosis of influenza from Nasopharyngeal swab specimens and should not be used as a sole basis for treatment. Nasal washings and aspirates are unacceptable for Xpert Xpress SARS-CoV-2/FLU/RSV testing.  Fact Sheet for Patients: [place]  Fact Sheet for Healthcare Providers: BloggerCourse.com  This test is not yet approved  or cleared by the [place] and has been authorized for detection and/or diagnosis of SARS-CoV-2 by FDA under an Emergency Use Authorization (EUA). This EUA will remain in effect (meaning this test can be used) for the duration of the COVID-19 declaration under Section 564(b)(1) of the Act, 21 U.S.C. section 360bbb-3(b)(1), unless the authorization is terminated or revoked.  Performed at [facility], [address] [place], [place] [address]       Radiology Studies: VAS US CAROTID  Result Date: 10/06/2020 Carotid Arterial Duplex Study Patient Name:  DAI MCADAMS  Date of Exam:   10/06/2020 Medical Rec #: [id number]          Accession #:    [id number] Date of Birth: 21-Mar-1946           Patient Gender: M Patient Age:   68Y Exam Location:  [facility] Procedure:      VAS US CAROTID Referring Phys: [id number] [facility] --------------------------------------------------------------------------------  Indications:       Pre-operative evaluation for VAD. Risk Factors:      Hypertension, Diabetes. Comparison Study:  No prior study Performing Technologist: Jean Rosenthal RDMS,RVT  Examination Guidelines: A complete evaluation includes B-mode imaging, spectral Doppler, color Doppler, and power Doppler as needed of all accessible portions of each vessel. Bilateral testing is considered an integral part of a complete examination. Limited examinations for reoccurring indications may be performed as noted.  Right Carotid Findings: +----------+--------+--------+--------+-----------------------+--------+            PSV cm/sEDV cm/sStenosisPlaque Description     Comments +----------+--------+--------+--------+-----------------------+--------+ CCA Prox  46      9                                               +----------+--------+--------+--------+-----------------------+--------+ CCA Distal40      12              heterogenous and smooth         +----------+--------+--------+--------+-----------------------+--------+ ICA Prox  34      12              smooth and heterogenous         +----------+--------+--------+--------+-----------------------+--------+ ICA Distal48      17                                              +----------+--------+--------+--------+-----------------------+--------+ ECA       70      9                                               +----------+--------+--------+--------+-----------------------+--------+ +----------+--------+-------+----------------+-------------------+           PSV cm/sEDV cmsDescribe        Arm Pressure (mmHG) +----------+--------+-------+----------------+-------------------+ [id number]            Multiphasic, WNL                    +----------+--------+-------+----------------+-------------------+ +---------+--------+--+--------+--+---------+ VertebralPSV cm/s35EDV cm/s11Antegrade +---------+--------+--+--------+--+---------+  Left Carotid Findings: +----------+--------+--------+--------+-----------------------+--------+           PSV cm/sEDV cm/sStenosisPlaque Description  Comments +----------+--------+--------+--------+-----------------------+--------+ CCA Prox  83      18                                              +----------+--------+--------+--------+-----------------------+--------+ CCA Distal47      13                                              +----------+--------+--------+--------+-----------------------+--------+ ICA Prox  32      9               smooth and heterogenous          +----------+--------+--------+--------+-----------------------+--------+ ICA Distal42      11                                              +----------+--------+--------+--------+-----------------------+--------+ ECA       70      12                                              +----------+--------+--------+--------+-----------------------+--------+ +----------+--------+--------+----------------+-------------------+           PSV cm/sEDV cm/sDescribe        Arm Pressure (mmHG) +----------+--------+--------+----------------+-------------------+ Subclavian119             Multiphasic, WNL                    +----------+--------+--------+----------------+-------------------+ +---------+--------+--+--------+--+---------+ VertebralPSV cm/s41EDV cm/s11Antegrade +---------+--------+--+--------+--+---------+   Summary: Right Carotid: Velocities in the right ICA are consistent with a 1-39% stenosis. Left Carotid: Velocities in the left ICA are consistent with a 1-39% stenosis. Vertebrals:  Bilateral vertebral arteries demonstrate antegrade flow. Subclavians: Normal flow hemodynamics were seen in bilateral subclavian              arteries. *See table(s) above for measurements and observations.  Electronically signed by Coral Else MD on 10/06/2020 at 6:16:40 PM.    Final    VAS [place] LOWER EXTREMITY VENOUS (DVT)  Result Date: 10/06/2020  Lower Venous DVT Study Patient Name:  EATHEN TITMUS  Date of Exam:   10/06/2020 Medical Rec #: [id number]          Accession #:    [id number] Date of Birth: [birth year]           Patient Gender: M Patient Age:   42Y Exam Location:  [facility] Procedure:      VAS [place] LOWER EXTREMITY VENOUS (DVT) Referring Phys: [id number] [facility] --------------------------------------------------------------------------------  Indications: Pre-op, and VAD evaluation.  Comparison Study: No prior study Performing Technologist: Jean Rosenthal RDMS,RVT  Examination  Guidelines: A complete evaluation includes B-mode imaging, spectral Doppler, color Doppler, and power Doppler as needed of all accessible portions of each vessel. Bilateral testing is considered an integral part of a complete examination. Limited examinations for reoccurring indications may be performed as noted. The reflux portion of the exam is performed with the patient in reverse Trendelenburg.  +---------+---------------+---------+-----------+----------+--------------+ RIGHT    CompressibilityPhasicitySpontaneityPropertiesThrombus Aging +---------+---------------+---------+-----------+----------+--------------+ CFV  Full           Yes      Yes                                 +---------+---------------+---------+-----------+----------+--------------+ SFJ      Full                                                        +---------+---------------+---------+-----------+----------+--------------+ FV Prox  Full                                                        +---------+---------------+---------+-----------+----------+--------------+ FV Mid   Full                                                        +---------+---------------+---------+-----------+----------+--------------+ FV DistalFull                                                        +---------+---------------+---------+-----------+----------+--------------+ PFV      Full                                                        +---------+---------------+---------+-----------+----------+--------------+ POP      Full           Yes      Yes                                 +---------+---------------+---------+-----------+----------+--------------+ PTV      Full                                                        +---------+---------------+---------+-----------+----------+--------------+ PERO     Full                                                         +---------+---------------+---------+-----------+----------+--------------+   +---------+---------------+---------+-----------+----------+--------------+ LEFT     CompressibilityPhasicitySpontaneityPropertiesThrombus Aging +---------+---------------+---------+-----------+----------+--------------+ CFV      Full           Yes      Yes                                 +---------+---------------+---------+-----------+----------+--------------+  SFJ      Full                                                        +---------+---------------+---------+-----------+----------+--------------+ FV Prox  Full                                                        +---------+---------------+---------+-----------+----------+--------------+ FV Mid   Full                                                        +---------+---------------+---------+-----------+----------+--------------+ FV DistalFull                                                        +---------+---------------+---------+-----------+----------+--------------+ PFV      Full                                                        +---------+---------------+---------+-----------+----------+--------------+ POP      Full           Yes      Yes                                 +---------+---------------+---------+-----------+----------+--------------+ PTV      Full                                                        +---------+---------------+---------+-----------+----------+--------------+ PERO     Full                                                        +---------+---------------+---------+-----------+----------+--------------+     Summary: RIGHT: - There is no evidence of deep vein thrombosis in the lower extremity.  - No cystic structure found in the popliteal fossa.  LEFT: - There is no evidence of deep vein thrombosis in the lower extremity.  - No cystic structure found in the popliteal fossa.   *See table(s) above for measurements and observations. Electronically signed by Coral Else MD on 10/06/2020 at 6:17:32 PM.    Final    US Abdomen Limited RUQ (LIVER/GB)  Result Date: 10/07/2020 CLINICAL DATA:  Elevated liver function tests. History of cholecystectomy. EXAM: ULTRASOUND ABDOMEN LIMITED RIGHT UPPER QUADRANT COMPARISON:  None. FINDINGS: Gallbladder: Surgically  absent. Common bile duct: Diameter: 3 mm Liver: Liver surface is finely irregular. Liver parenchymal echotexture is mildly coarsened. No liver masses. Portal vein is patent on color Doppler imaging with normal direction of blood flow towards the liver. Other: None. IMPRESSION: 1. Finely irregular liver surface with mildly coarsened liver parenchymal echotexture, suggesting cirrhosis. No liver masses. 2. No biliary ductal dilatation status post cholecystectomy. Electronically Signed   By: Delbert PhenixJason A Poff M.D.   On: 10/07/2020 11:22     Scheduled Meds: . amiodarone  200 mg Oral Daily  . apixaban  5 mg Oral BID  . Chlorhexidine Gluconate Cloth  6 each Topical Daily  . dapagliflozin propanediol  10 mg Oral Daily  . digoxin  0.125 mg Oral Daily  . docusate sodium  100 mg Oral BID  . escitalopram  10 mg Oral Daily  . insulin aspart  0-9 Units Subcutaneous TID WC  . losartan  12.5 mg Oral BID  . metolazone  2.5 mg Oral Once  . mometasone-formoterol  2 puff Inhalation BID  . multivitamin with minerals  1 tablet Oral Daily  . polyethylene glycol  17 g Oral BID  . potassium chloride  40 mEq Oral BID  . potassium chloride  40 mEq Oral Once  . sodium chloride flush  10-40 mL Intracatheter Q12H  . sodium chloride flush  3 mL Intravenous Q12H  . sodium chloride flush  3 mL Intravenous Q12H  . sodium chloride flush  3 mL Intravenous Q12H  . spironolactone  25 mg Oral Daily  . torsemide  80 mg Oral BID   Continuous Infusions: . sodium chloride    . sodium chloride 10 mL/hr at 10/05/20 1200  . sodium chloride       LOS: 10 days    Time Spent in minutes   30 minutes  Jimmy Plessinger D.O. on 10/08/2020 at 8:45 AM  Between 7am to 7pm - Please see pager noted on [email or web address]  After 7pm go to [email or web address]  And look for the night coverage person covering for me after hours  [facility] Office  [phone]

## 2020-10-08 NOTE — Progress Notes (Signed)
Co-ox collected and sent

## 2020-10-08 NOTE — Progress Notes (Signed)
Patient ID: Chad Wilkerson, male   DOB: October 02, 1945, 75 y.o.   MRN: [id number]     Advanced Heart Failure Rounding Note  PCP-Cardiologist: Werner Lean, MD   Subjective:    Milrinone turned off yesterday.  Co-ox lower at 52% today.  Weight up 2 lbs, CVP about 11.   Denies dyspnea.   VF with ICD shock on 4/22.  Tikosyn stopped and amiodarone gtt started.  No further VT/VF but still with PVCs.  Now on po amiodarone.   RHC Procedural Findings (on milrinone gtt): Hemodynamics (mmHg) RA mean 10 RV 60/11 PA 64/23, mean 37 PCWP mean 23 Oxygen saturations: PA 74% AO 96% Cardiac Output (Fick) 6.22  Cardiac Index (Fick) 2.89 PVR 2.25 WU Cardiac Output (Thermo) 4.84 Cardiac Index (Thermo) 2.25  PVR 2.89 WU PAPI 4.1   Objective:   Weight Range: 97.1 kg Body mass index is 32.08 kg/m.   Vital Signs:   Temp:  [97.4 F (36.3 C)-98.2 F (36.8 C)] 97.6 F (36.4 C) (04/29 0421) Pulse Rate:  [65-73] 73 (04/28 1959) Resp:  [18-21] 18 (04/29 0419) BP: (88-103)/(65-71) 88/65 (04/29 0419) SpO2:  [94 %-95 %] 94 % (04/29 0421) Weight:  [97.1 kg] 97.1 kg (04/29 0421) Last BM Date: 10/07/20  Weight change: Filed Weights   10/06/20 0346 10/07/20 0352 10/08/20 0421  Weight: 95.3 kg 96.2 kg 97.1 kg    Intake/Output:   Intake/Output Summary (Last 24 hours) at 10/08/2020 0833 Last data filed at 10/08/2020 0758 Gross per 24 hour  Intake 846 ml  Output 2975 ml  Net -2129 ml      Physical Exam  CVP 11 General: NAD Neck: JVP 10-12 cm, no thyromegaly or thyroid nodule.  Lungs: Clear to auscultation bilaterally with normal respiratory effort. CV: Nondisplaced PMI.  Heart regular S1/S2, no S3/S4, 2/6 SEM RUSB.  No peripheral edema.   Abdomen: Soft, nontender, no hepatosplenomegaly, no distention.  Skin: Intact without lesions or rashes.  Neurologic: Alert and oriented x 3.  Psych: Normal affect. Extremities: No clubbing or cyanosis.  HEENT: Normal.    Telemetry    NSR with occasional PVCs (personally reviewed)   Labs    CBC Recent Labs    10/07/20 0500 10/08/20 0500  WBC 8.7 9.3  HGB 15.2 15.2  HCT 46.3 45.9  MCV 87.7 87.3  PLT 302 009   Basic Metabolic Panel Recent Labs    10/05/20 1706 10/06/20 0349 10/06/20 1900 10/07/20 0500 10/08/20 0500  NA 135 134*   < > 132* 132*  K 3.3* 3.3*   < > 3.6 3.7  CL 94* 93*   < > 95* 93*  CO2 29 30   < > 28 29  GLUCOSE 250* 211*   < > 175* 143*  BUN 31* 32*   < > 25* 25*  CREATININE 1.05 1.00   < > 1.00 1.05  CALCIUM 9.1 9.1   < > 8.7* 8.9  MG 2.3 2.1  --   --   --   PHOS  --  4.2  --   --   --    < > = values in this interval not displayed.   Liver Function Tests Recent Labs    10/07/20 0500 10/08/20 0500  AST 150* 79*  ALT 143* 111*  ALKPHOS 129* 121  BILITOT 1.3* 1.4*  PROT 7.5 7.6  ALBUMIN 3.4* 3.5   No results for input(s): LIPASE, AMYLASE in the last 72 hours. Cardiac Enzymes No results for input(s):  CKTOTAL, CKMB, CKMBINDEX, TROPONINI in the last 72 hours.  BNP: BNP (last 3 results) Recent Labs    09/28/20 1613  BNP 3,328.4*    ProBNP (last 3 results) Recent Labs    04/01/20 1509  PROBNP 5,001*     D-Dimer No results for input(s): DDIMER in the last 72 hours. Hemoglobin A1C No results for input(s): HGBA1C in the last 72 hours. Fasting Lipid Panel Recent Labs    10/07/20 0500  CHOL 87  HDL 31*  LDLCALC 42  TRIG 68  CHOLHDL 2.8   Thyroid Function Tests Recent Labs    10/07/20 0351  TSH 2.953    Other results:   Imaging    US Abdomen Limited RUQ (LIVER/GB)  Result Date: 10/07/2020 CLINICAL DATA:  Elevated liver function tests. History of cholecystectomy. EXAM: ULTRASOUND ABDOMEN LIMITED RIGHT UPPER QUADRANT COMPARISON:  None. FINDINGS: Gallbladder: Surgically absent. Common bile duct: Diameter: 3 mm Liver: Liver surface is finely irregular. Liver parenchymal echotexture is mildly coarsened. No liver masses. Portal vein is patent on color  Doppler imaging with normal direction of blood flow towards the liver. Other: None. IMPRESSION: 1. Finely irregular liver surface with mildly coarsened liver parenchymal echotexture, suggesting cirrhosis. No liver masses. 2. No biliary ductal dilatation status post cholecystectomy. Electronically Signed   By: Ilona Sorrel M.D.   On: 10/07/2020 11:22     Medications:     Scheduled Medications: . amiodarone  200 mg Oral Daily  . apixaban  5 mg Oral BID  . Chlorhexidine Gluconate Cloth  6 each Topical Daily  . dapagliflozin propanediol  10 mg Oral Daily  . digoxin  0.125 mg Oral Daily  . docusate sodium  100 mg Oral BID  . escitalopram  10 mg Oral Daily  . insulin aspart  0-9 Units Subcutaneous TID WC  . losartan  12.5 mg Oral BID  . metolazone  2.5 mg Oral Once  . mometasone-formoterol  2 puff Inhalation BID  . multivitamin with minerals  1 tablet Oral Daily  . polyethylene glycol  17 g Oral BID  . potassium chloride  40 mEq Oral BID  . potassium chloride  40 mEq Oral Once  . sodium chloride flush  10-40 mL Intracatheter Q12H  . sodium chloride flush  3 mL Intravenous Q12H  . sodium chloride flush  3 mL Intravenous Q12H  . sodium chloride flush  3 mL Intravenous Q12H  . spironolactone  25 mg Oral Daily  . torsemide  80 mg Oral BID    Infusions: . sodium chloride    . sodium chloride 10 mL/hr at 10/05/20 1200  . sodium chloride      PRN Medications: sodium chloride, acetaminophen **OR** acetaminophen, ondansetron **OR** ondansetron (ZOFRAN) IV, polyethylene glycol, sodium chloride flush, sodium chloride flush   Assessment/Plan   1. Acute on chronic systolic CHF: Long-standing NICM, followed in the past in [place].  Has MDT ICD.  Echo 09/28/20 with EF <20%, severe LV dilation, mild RV dilation with moderate RV dysfunction, mild-moderate MR, moderate-severe TR, severe biatrial enlargement, no more than moderate AS.  Last cath in 2/22 showed nonobstructive mild CAD, elevated  right and left heart filling pressures with low CI 2.04; AS no more than moderate.  Cause of cardiomyopathy uncertain, he is generally in NSR so unlikely to be tachy-mediated CMP.  Possible remote viral infection/viral myocarditis. NYHA class IV at admission.  He was diuresed on milrinone, then milrinone stopped yesterday.  Co-ox down to 52% this morning with weight  and CVP rising (CVP 11 today). Creatinine stable 1.05.  - Repeat co-ox now, if still < 55% would restart milrinone (and will likely need home milrinone).   - Continue torsemide 80 mg bid, will give 1 dose of metolazone 2.5 today with extra K.  He will need metolazone probably once or twice a week at home.   - Continue digoxin 0.125 daily.  - Continue Farxiga and spironolactone 25 mg daily.  - Continue losartan 12.5 mg bid, SBP soft.  Hold off on entresto.  - Long-term, concerned about his trajectory.  Has been doing poorly at home.  Low output HF this admission.  RV function on RHC appears adequate with PAPi 4.1, creatinine stable.  We have been discussing LVAD, he has met an LVAD patient and was able to get his questions about the LVAD answered.  Our plan for now will be to try to wean his milrinone.  If he has adequate co-ox off milrinone will let him go home but follow closely, understanding that he will likely have a narrow window for [place] to decide on mechanical support.  If co-ox is inadequate off milrinone, will arrange for home milrinone to give him a break from the hospital.  He thinks he will likely move towards LVAD eventually in that case.  Regardless, he wants to try to get home for at least some time. Possibly home tomorrow once we work out whether he will need home milrinone or not.  2. Atrial fibrillation: Paroxysmal.  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 Off Tikosyn after VF on 4/22.  Did not tolerate amiodarone in the past but doing well on it so far.  - Amiodarone decreased to 200 mg daily with elevated LFTs.   - Continue  Eliquis.  - Replace K and Mg.  3. Aortic stenosis: Based on full evaluation (cath and echo), suspect no more than moderate aortic stenosis.  I do not think he would benefit from TAVR at this point.  4. 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  - statin.  5. VF: VF terminated by ICD shock on 4/22.  Milrinone decreased to 0.25.  Tikosyn stopped and amiodarone gtt started.  - Continue amiodarone.  - Supp K 6. Hyponatremia: Hypervolemic hyponatremia - Keep fluid restricted.  7. Elevated LFTs:  ?Amiodarone, ?CHF.  Viral hepatitis labs negative. Trending down today. Abdominal US with possible early cirrhosis, no gallbladder.  - Continue lower dose amiodarone 200 mg daily.   Repeat co-ox, need to decided +/- home milrinone, possibly home tomorrow.  Continue LVAD workup.    Length of Stay: [place], MD  10/08/2020, 8:33 AM  Advanced Heart Failure Team Pager [phone] (M-F; 7a - 5p)  Please contact [facility] for night-coverage after hours (5p -7a ) and weekends on [email or web address]

## 2020-10-08 NOTE — TOC Initial Note (Signed)
Transition of Care [facility]) - Initial/Assessment Note    Patient Details  Name: Chad Wilkerson MRN: [id number] Date of Birth: 05-15-1946  Transition of Care [facility]) CM/SW Contact:    Gala Lewandowsky, RN Phone Number: 10/08/2020, 4:05 PM  Clinical Narrative: Case Manager received verbal confirmation that the patient will transition home on Saturday with IV Milrinone and will need Home Health RN Services. Case Manager reached out to [facility] for assistance with IV Milrinone- and [facility] for RN services. [facility] accepted the patient and start of care to begin within 24 hours of transition home. Agency aware that the patient will d/c on Saturday. [facility] to educate the patient today with IV Milrinone. The patient states he has a significant other in the home with him. The patient has decline home health services for PT- states he will start going back to the gym soon. No further needs identified at this time.                Expected Discharge Plan: Home w Home Health Services Barriers to Discharge: No Barriers Identified   Patient Goals and CMS Choice Patient states their goals for this hospitalization and ongoing recovery are:: to return home   Choice offered to / list presented to : NA (Patient did not have a preference for home health services.)  Expected Discharge Plan and Services Expected Discharge Plan: Home w Home Health Services   Discharge Planning Services: CM Consult Post Acute Care Choice: Home Health Living arrangements for the past 2 months: Single Family Home                   DME Agency: NA       HH Arranged: Charity fundraiser (declined home health PT) HH Agency: [facility] Date HH Agency Contacted: 10/08/20 Time HH Agency Contacted: 1200 Representative spoke with at [facility] Agency: Pearson Grippe  Prior Living Arrangements/Services Living arrangements for the past 2 months: Single Family Home Lives with:: Self,Significant Other Patient language  and need for interpreter reviewed:: Yes        Need for Family Participation in Patient Care: Yes (Comment) Care giver support system in place?: Yes (comment)   Criminal Activity/Legal Involvement Pertinent to Current Situation/Hospitalization: No - Comment as needed  Activities of Daily Living Home Assistive Devices/Equipment: BIPAP,Cane (specify quad or straight) ADL Screening (condition at time of admission) Patient's cognitive ability adequate to safely complete daily activities?: Yes Is the patient deaf or have difficulty hearing?: Yes Does the patient have difficulty seeing, even when wearing glasses/contacts?: No Does the patient have difficulty concentrating, remembering, or making decisions?: No Patient able to express need for assistance with ADLs?: No Does the patient have difficulty dressing or bathing?: No Independently performs ADLs?: Yes (appropriate for developmental age) Does the patient have difficulty walking or climbing stairs?: Yes Weakness of Legs: None Weakness of Arms/Hands: None  Permission Sought/Granted Permission sought to share information with : Facility Science writer granted to share information with : Yes, Verbal Permission Granted     Permission granted to share info w AGENCY: [facility] for IV Milrinone and [facility] for RN for IV Milrinone.        Emotional Assessment   Attitude/Demeanor/Rapport: Engaged Affect (typically observed): Appropriate Orientation: : Oriented to Situation,Oriented to  Time,Oriented to Place Alcohol / Substance Use: Not Applicable Psych Involvement: No (comment)  Admission diagnosis:  Acute on chronic combined systolic (congestive) and diastolic (congestive) heart failure (HCC) [I50.43] Acute  on chronic congestive heart failure, unspecified heart failure type [facility]) [I50.9] Patient Active Problem List   Diagnosis Date Noted  . Acute on chronic combined systolic  (congestive) and diastolic (congestive) heart failure (HCC) 09/28/2020  . 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 NICM (nonischemic cardiomyopathy) (HCC) 07/20/2020  . Acute on chronic systolic CHF (congestive heart failure), NYHA class 3 (HCC) 07/19/2020  . Acute on chronic systolic CHF (congestive heart failure), NYHA class 4 (HCC)   . Aortic valve stenosis   . Heart failure with reduced ejection fraction (HCC) 04/01/2020  . Paroxysmal atrial fibrillation (HCC) 04/01/2020  . Moderate aortic stenosis 04/01/2020  . Mild mitral regurgitation 04/01/2020  . Morbid obesity (HCC) 04/01/2020  . Sleep apnea   . Diabetes mellitus with coincident hypertension (HCC)   . Diverticulitis   . CHF (congestive heart failure) (HCC)   . Adjustment disorder    PCP:  [name], NP Pharmacy:   [facility] [phone] Ginette Otto, [place] - [address] AT [facility] OF [address] & [facility] [address] [place] [address] Phone: [phone] Fax: [phone]     Social Determinants of Health (SDOH) Interventions Food Insecurity Interventions: Intervention Not Indicated Financial Strain Interventions: Intervention Not Indicated Housing Interventions: Intervention Not Indicated Transportation Interventions: Intervention Not Indicated  Readmission Risk Interventions No flowsheet data found.

## 2020-10-08 NOTE — Progress Notes (Addendum)
RN was with pt and notice a sound from ICD but it did not shock. Pt stated that sometimes it does makes that sound.  pt denies chest pain, shortness of breath or dizziness.  Amy NP made aware.

## 2020-10-09 DIAGNOSIS — G4733 Obstructive sleep apnea (adult) (pediatric): Secondary | ICD-10-CM | POA: Diagnosis not present

## 2020-10-09 DIAGNOSIS — E1169 Type 2 diabetes mellitus with other specified complication: Secondary | ICD-10-CM

## 2020-10-09 DIAGNOSIS — I5043 Acute on chronic combined systolic (congestive) and diastolic (congestive) heart failure: Secondary | ICD-10-CM | POA: Diagnosis not present

## 2020-10-09 DIAGNOSIS — Z95810 Presence of automatic (implantable) cardiac defibrillator: Secondary | ICD-10-CM | POA: Diagnosis not present

## 2020-10-09 DIAGNOSIS — I480 Paroxysmal atrial fibrillation: Secondary | ICD-10-CM | POA: Diagnosis not present

## 2020-10-09 LAB — COMPREHENSIVE METABOLIC PANEL
ALT: 98 U/L — ABNORMAL HIGH (ref 0–44)
AST: 63 U/L — ABNORMAL HIGH (ref 15–41)
Albumin: 3.6 g/dL (ref 3.5–5.0)
Alkaline Phosphatase: 114 U/L (ref 38–126)
Anion gap: 13 (ref 5–15)
BUN: 34 mg/dL — ABNORMAL HIGH (ref 8–23)
CO2: 28 mmol/L (ref 22–32)
Calcium: 8.8 mg/dL — ABNORMAL LOW (ref 8.9–10.3)
Chloride: 85 mmol/L — ABNORMAL LOW (ref 98–111)
Creatinine, Ser: 1.17 mg/dL (ref 0.61–1.24)
GFR, Estimated: >60 mL/min (ref >60)
Glucose, Bld: 234 mg/dL — ABNORMAL HIGH (ref 70–99)
Potassium: 3.3 mmol/L — ABNORMAL LOW (ref 3.5–5.1)
Sodium: 126 mmol/L — ABNORMAL LOW (ref 135–145)
Total Bilirubin: 1.6 mg/dL — ABNORMAL HIGH (ref 0.3–1.2)
Total Protein: 7.8 g/dL (ref 6.5–8.1)

## 2020-10-09 LAB — CBC
HCT: 45.9 % (ref 39.0–52.0)
Hemoglobin: 15.5 g/dL (ref 13.0–17.0)
MCH: 28.9 pg (ref 26.0–34.0)
MCHC: 33.8 g/dL (ref 30.0–36.0)
MCV: 85.5 fL (ref 80.0–100.0)
Platelets: 288 10*3/uL (ref 150–400)
RBC: 5.37 MIL/uL (ref 4.22–5.81)
RDW: 15.5 % (ref 11.5–15.5)
WBC: 8.7 10*3/uL (ref 4.0–10.5)
nRBC: 0 % (ref 0.0–0.2)

## 2020-10-09 LAB — COOXEMETRY PANEL
Carboxyhemoglobin: 1.4 % (ref 0.5–1.5)
Methemoglobin: 0.7 % (ref 0.0–1.5)
O2 Saturation: 67.7 %
Total hemoglobin: 15.7 g/dL (ref 12.0–16.0)

## 2020-10-09 LAB — GLUCOSE, CAPILLARY
Glucose-Capillary: 176 mg/dL — ABNORMAL HIGH (ref 70–99)
Glucose-Capillary: 178 mg/dL — ABNORMAL HIGH (ref 70–99)

## 2020-10-09 MED ORDER — POTASSIUM CHLORIDE CRYS ER 20 MEQ PO TBCR: 40.0000 meq | EXTENDED_RELEASE_TABLET | Freq: Every day | ORAL | 1 refills | Status: DC

## 2020-10-09 MED ORDER — POTASSIUM CHLORIDE CRYS ER 20 MEQ PO TBCR
20.0000 meq | EXTENDED_RELEASE_TABLET | Freq: Once | ORAL | Status: AC
  Administered 2020-10-09: 20 meq via ORAL
  Filled 2020-10-09: qty 1

## 2020-10-09 MED ORDER — ATORVASTATIN CALCIUM 10 MG PO TABS
20.0000 mg | ORAL_TABLET | Freq: Every day | ORAL | Status: DC
  Administered 2020-10-09: 20 mg via ORAL
  Filled 2020-10-09: qty 2

## 2020-10-09 MED ORDER — AMIODARONE HCL 200 MG PO TABS: 200.0000 mg | ORAL_TABLET | Freq: Every day | ORAL | 1 refills | Status: DC

## 2020-10-09 MED ORDER — POTASSIUM CHLORIDE CRYS ER 20 MEQ PO TBCR
40.0000 meq | EXTENDED_RELEASE_TABLET | Freq: Once | ORAL | Status: AC
  Administered 2020-10-09: 40 meq via ORAL

## 2020-10-09 MED ORDER — TORSEMIDE 40 MG PO TABS: 80.0000 mg | ORAL_TABLET | Freq: Two times a day (BID) | ORAL | 1 refills | Status: DC

## 2020-10-09 MED ORDER — POTASSIUM CHLORIDE CRYS ER 20 MEQ PO TBCR
40.0000 meq | EXTENDED_RELEASE_TABLET | Freq: Two times a day (BID) | ORAL | Status: DC
  Filled 2020-10-09: qty 2

## 2020-10-09 MED ORDER — DIGOXIN 125 MCG PO TABS: 0.1250 mg | ORAL_TABLET | Freq: Every day | ORAL | 1 refills | Status: DC

## 2020-10-09 MED ORDER — MILRINONE LACTATE IN DEXTROSE 20-5 MG/100ML-% IV SOLN: 0.2500 ug/kg/min | INTRAVENOUS | Status: DC

## 2020-10-09 MED ORDER — LOSARTAN POTASSIUM 25 MG PO TABS: 12.5000 mg | ORAL_TABLET | Freq: Two times a day (BID) | ORAL | 1 refills | Status: DC

## 2020-10-09 MED ORDER — POTASSIUM CHLORIDE CRYS ER 20 MEQ PO TBCR
20.0000 meq | EXTENDED_RELEASE_TABLET | Freq: Every evening | ORAL | 1 refills | Status: DC
Start: 2020-10-09 — End: 2020-11-05

## 2020-10-09 NOTE — Progress Notes (Signed)
Patient ID: Chad Wilkerson, male   DOB: [birth year], 75 y.o.   MRN: [id number]     Advanced Heart Failure Rounding Note  PCP-Cardiologist: Werner Lean, MD   Subjective:    Co-ox dropped to 49% off milrinone, restarted milrinone at 0.25 and co-ox 68% today.  CVP about 6 after good diuresis yesterday (added metolazone dose).  Na lower at 126.   Denies dyspnea.   VF with ICD shock on 4/22.  Tikosyn stopped and amiodarone gtt started.  No further VT/VF.  Now on po amiodarone.   RHC Procedural Findings (on milrinone gtt): Hemodynamics (mmHg) RA mean 10 RV 60/11 PA 64/23, mean 37 PCWP mean 23 Oxygen saturations: PA 74% AO 96% Cardiac Output (Fick) 6.22  Cardiac Index (Fick) 2.89 PVR 2.25 WU Cardiac Output (Thermo) 4.84 Cardiac Index (Thermo) 2.25  PVR 2.89 WU PAPI 4.1   Objective:   Weight Range: 95.3 kg Body mass index is 31.48 kg/m.   Vital Signs:   Temp:  [97.4 F (36.3 C)-97.8 F (36.6 C)] 97.4 F (36.3 C) (04/30 0741) Pulse Rate:  [61-70] 61 (04/30 0741) Resp:  [14-20] 20 (04/30 0741) BP: (95-108)/(64-71) 96/68 (04/30 0741) SpO2:  [93 %-97 %] 94 % (04/30 0741) FiO2 (%):  [21 %] 21 % (04/29 0907) Weight:  [95.3 kg] 95.3 kg (04/30 0411) Last BM Date: 10/08/20  Weight change: Filed Weights   10/07/20 0352 10/08/20 0421 10/09/20 0411  Weight: 96.2 kg 97.1 kg 95.3 kg    Intake/Output:   Intake/Output Summary (Last 24 hours) at 10/09/2020 0813 Last data filed at 10/09/2020 0414 Gross per 24 hour  Intake 420 ml  Output 5325 ml  Net -4905 ml      Physical Exam  CVP 6 General: NAD Neck: No JVD, no thyromegaly or thyroid nodule.  Lungs: Clear to auscultation bilaterally with normal respiratory effort. CV: Lateral PMI.  Heart regular S1/S2, no S3/S4, 2/6 SEM RUSB.  No peripheral edema.   Abdomen: Soft, nontender, no hepatosplenomegaly, no distention.  Skin: Intact without lesions or rashes.  Neurologic: Alert and oriented x 3.  Psych: Normal  affect. Extremities: No clubbing or cyanosis.  HEENT: Normal.    Telemetry   NSR  (personally reviewed)   Labs    CBC Recent Labs    10/08/20 0500 10/09/20 0500  WBC 9.3 8.7  HGB 15.2 15.5  HCT 45.9 45.9  MCV 87.3 85.5  PLT 310 423   Basic Metabolic Panel Recent Labs    10/08/20 0500 10/09/20 0500  NA 132* 126*  K 3.7 3.3*  CL 93* 85*  CO2 29 28  GLUCOSE 143* 234*  BUN 25* 34*  CREATININE 1.05 1.17  CALCIUM 8.9 8.8*   Liver Function Tests Recent Labs    10/08/20 0500 10/09/20 0500  AST 79* 63*  ALT 111* 98*  ALKPHOS 121 114  BILITOT 1.4* 1.6*  PROT 7.6 7.8  ALBUMIN 3.5 3.6   No results for input(s): LIPASE, AMYLASE in the last 72 hours. Cardiac Enzymes No results for input(s): CKTOTAL, CKMB, CKMBINDEX, TROPONINI in the last 72 hours.  BNP: BNP (last 3 results) Recent Labs    09/28/20 1613  BNP 3,328.4*    ProBNP (last 3 results) Recent Labs    04/01/20 1509  PROBNP 5,001*     D-Dimer No results for input(s): DDIMER in the last 72 hours. Hemoglobin A1C No results for input(s): HGBA1C in the last 72 hours. Fasting Lipid Panel Recent Labs    10/07/20 0500  CHOL 87  HDL 31*  LDLCALC 42  TRIG 68  CHOLHDL 2.8   Thyroid Function Tests Recent Labs    10/07/20 0351  TSH 2.953    Other results:   Imaging    No results found.   Medications:     Scheduled Medications: . amiodarone  200 mg Oral Daily  . apixaban  5 mg Oral BID  . atorvastatin  20 mg Oral Daily  . Chlorhexidine Gluconate Cloth  6 each Topical Daily  . dapagliflozin propanediol  10 mg Oral Daily  . digoxin  0.125 mg Oral Daily  . docusate sodium  100 mg Oral BID  . escitalopram  10 mg Oral Daily  . insulin aspart  0-9 Units Subcutaneous TID WC  . losartan  12.5 mg Oral BID  . mometasone-formoterol  2 puff Inhalation BID  . multivitamin with minerals  1 tablet Oral Daily  . polyethylene glycol  17 g Oral BID  . potassium chloride  40 mEq Oral BID  .  potassium chloride  40 mEq Oral Once  . sodium chloride flush  10-40 mL Intracatheter Q12H  . sodium chloride flush  3 mL Intravenous Q12H  . sodium chloride flush  3 mL Intravenous Q12H  . sodium chloride flush  3 mL Intravenous Q12H  . spironolactone  25 mg Oral Daily  . torsemide  80 mg Oral BID    Infusions: . sodium chloride    . sodium chloride 10 mL/hr at 10/05/20 1200  . sodium chloride    . milrinone 0.25 mcg/kg/min (10/08/20 2128)    PRN Medications: sodium chloride, acetaminophen **OR** acetaminophen, ondansetron **OR** ondansetron (ZOFRAN) IV, polyethylene glycol, sodium chloride flush, sodium chloride flush   Assessment/Plan   1. Acute on chronic systolic CHF: Long-standing NICM, followed in the past in [place].  Has MDT ICD.  Echo 09/28/20 with EF <20%, severe LV dilation, mild RV dilation with moderate RV dysfunction, mild-moderate MR, moderate-severe TR, severe biatrial enlargement, no more than moderate AS.  Last cath in 2/22 showed nonobstructive mild CAD, elevated right and left heart filling pressures with low CI 2.04; AS no more than moderate.  Cause of cardiomyopathy uncertain, he is generally in NSR so unlikely to be tachy-mediated CMP.  Possible remote viral infection/viral myocarditis. NYHA class IV at admission.  He was diuresed on milrinone, then milrinone stopped with drop in co-ox to 49%.  Milrinone restarted at 0.25, co-ox back up to 68%.  CVP 6 today.  - Continue milrinone 0.25, he appears milrinone dependent and will continue at home. - Continue torsemide 80 mg bid for home. - Continue digoxin 0.125 daily.  - Continue Farxiga and spironolactone 25 mg daily.  - Continue losartan 12.5 mg bid, SBP soft.  Hold off on entresto.  - He has low output HF.  RV function on RHC appears adequate with PAPi 4.1, creatinine stable.  We have been discussing LVAD and he has been undergoing workup, he has met an LVAD patient and was able to get his questions about the LVAD  answered.  He is going to go home with milrinone to give him a break from the hospital.  He thinks he will likely move towards LVAD eventually.  He will need very close followup in CHF clinic.  2. Atrial fibrillation: Paroxysmal.  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 Off Tikosyn after VF on 4/22.  Did not tolerate amiodarone in the past but doing well on it so far.  - Amiodarone  decreased to 200 mg daily with elevated LFTs, LFTs trending down.   - Continue Eliquis.  - Replace K and Mg.  3. Aortic stenosis: Based on full evaluation (cath and echo), suspect no more than moderate aortic stenosis.  I do not think he would benefit from TAVR at this point.  4. 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  - statin (can restart).  5. VF: VF terminated by ICD shock on 4/22.  Milrinone decreased to 0.25.  Tikosyn stopped and amiodarone gtt started.  - Continue amiodarone.  - Supp K 6. Hyponatremia: Hypervolemic hyponatremia initially.  Today, Na down to 126 but CVP 6.  Suspect related to metolazone use yesterday.  - Keep fluid restricted.  7. Elevated LFTs:  ?Amiodarone, ?CHF.  Doubt related to statin. Viral hepatitis labs negative. Trending down today. Abdominal US with possible early cirrhosis, no gallbladder.  - Continue lower dose amiodarone 200 mg daily.   Disposition: I think he can go home today. He will need milrinone gtt via home health. He will need close followup in CHF clinic and CMET next week.  Cardiac meds for home: Eliquis 5 mg bid, amiodarone 200 mg daily, losartan 12.5 mg bid, spironolactone 25 daily, Farxiga 10 mg daily, digoxin 0.125 daily, torsemide 80 mg bid, atorvastatin 20 daily, KCl 40 qam/20 qpm.   Length of Stay: 73  Loralie Champagne, MD  10/09/2020, 8:13 AM  Advanced Heart Failure Team Pager [phone] (M-F; 7a - 5p)  Please contact [facility] for night-coverage after hours (5p -7a ) and weekends on [email or web address]

## 2020-10-09 NOTE — Progress Notes (Signed)
CARDIAC REHAB PHASE I   PRE:  Rate/Rhythm: 72 SR with PVCs    BP: sitting 96/67    SaO2: 93 RA  MODE:  Ambulation: 370 ft   POST:  Rate/Rhythm: 76 SR with PVCs    BP: sitting 94/69     SaO2: 93 RA  Pt donned his socks then ambulated hall with his cane at slow pace. Gait belt for safety but no real assist. Monitor alarming with movement of VT but every time he stopped, artifact calmed and in SR. No major c/o. To recliner. Reviewed ed with pt. He is happy to go home and spend some time with his s/o.  [id number]   Harriet Masson CES, ACSM 10/09/2020 11:26 AM

## 2020-10-09 NOTE — Discharge Summary (Signed)
Physician Discharge Summary  Chad Wilkerson PFX:[id number] DOB: 1945/07/09 DOA: 09/28/2020  PCP: [name], NP  Admit date: 09/28/2020 Discharge date: 10/09/2020  Time spent: 45 minutes  Recommendations for Outpatient Follow-up:  Patient will be discharged to home with home health services.  Patient will need to follow up with primary care provider within one week of discharge.  Follow up with cardiology. Repeat CMP in one week.  Patient should continue medications as prescribed.  Patient should follow a heart healthy/carb modified diet.    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 Ezekiel Slocumb Wesselhoftis a 75 y.o.malewith medical history significant forchronic combined systolic and diastolic CHF (EF less than 20%, G3 DD by TTE 09/28/2020),NICM,s/p ICD, atrial fibrillation on Eliquis and Tikosyn, T2DM, HTN, and OSA on BiPAP who presents to the ED for evaluation of shortness of breath andweightgain.  Patient reports 1 week of progressive dyspnea, DOE, orthopnea weight gain. He was seen in follow-up in his cardiology office earlier today. He underwent echocardiogram which showed EF <20%. He had significant shortness of breath therefore was sent to the ED for further management.  Patient otherwise denies any chest pain, subjective fevers, chills, diaphoresis, cough, nausea, vomiting, or dysuria. He has not seen any obvious bleeding.  Hospital Course:  Acute on chronic combined systolic and diastolic heart  failure -Patient presented with shortness of breath and weight gain -Echocardiogram 4/19 showed an EF less than 20% with grade 3 diastolic dysfunction. -Cardiology consulted and appreciated -Was placed on milrinone as well as Lasix drips -Status post right heart catheterization on 10/05/20, showing good cardiac output and PAPI on milrinone.  Elevated L> R heart filling pressures.  Moderate pulmonary venous hypertension.  Recommendations were to continue milrinone and Lasix gtt and LVAD discussions. -Was placed on milrinone and Lasix drips however discontinued on 4/27.  Milrinone drip started on 4/29.  Patient was transitioned to IV Lasix. -Continue to monitor intake and output, daily weights -CT scan order on 4/27, however has not been completed yet -Lower extremity Doppler as well as carotid ultrasound unremarkable -Per cardiology, will discharge patient with milrinone at home -Medications for home: Eliquis 5 mg twice daily, amiodarone 200 mg daily, losartan 12.5 mg twice daily, spironolactone 25 mg daily, Farxiga 10 mg daily, digoxin 0.125 mg daily, torsemide 80 mg twice daily, atorvastatin 20 mg daily, potassium 40 mEq every morning, 20 mEq every afternoon  Ventricular fibrillation -Patient with ICD in place, ICD shock on 4/22 -Milrinone being decreased -Tikosyn discontinued -Currently on amiodarone  Hyponatremia -Status post tolvaptan per cardiology on 4/25 -Sodium 126 today, discussed with cardiology, patient did receive metolazone on 10/08/2020, which may account for this drop in sodium.  -Repeat CMP in 1 week   Hypokalemia -Suspect secondary to diuresis -Continue to replace- repeat CMP in one week  Coronary artery disease -Status post heart catheterization, nonobstructive coronary disease -May resume statin on discharge  Paroxysmal atrial fibrillation -Currently on Eliquis, amiodarone -Coreg held  Diabetes mellitus, type II -Metformin held -Currently on Farxiga   -continue insulin sliding scale and CBG monitoring  Obstructive sleep apnea -Continue  BiPAP nightly  Elevated LFTs -AST and ALT  trending downward -concerning given amiodarone use -RUQ ultrasound: Possible cirrhosis.  No biliary ductal dilatation -Hepatitis panel unremarkable -Repeat CMP in 1 week  Goals of care -Patient currently DNR -Palliative care consulted and appreciated  Consultants Cardiology  Procedures  Echocardiogram Right heart cath Lower extremity Doppler Carotid Doppler   Discharge Exam: Vitals:   10/09/20 0357 10/09/20 0741  BP: 97/67 96/68  Pulse: 63 61  Resp: 16 20  Temp: 97.8 F (36.6 C) (!) 97.4 F (36.3 C)  SpO2: 93% 94%     General: Well developed, well nourished, NAD, appears stated age  HEENT: NCAT, PERRLA, EOMI, Anicteic Sclera, mucous membranes moist.  Neck: Supple, no JVD, no masses  Cardiovascular: S1 S2 auscultated, no rubs, murmurs or gallops. Regular rate and rhythm.  Respiratory: Clear to auscultation bilaterally with equal chest rise  Abdomen: Soft, nontender, nondistended, + bowel sounds  Extremities: warm dry without cyanosis clubbing or edema  Neuro: AAOx3, cranial nerves grossly intact. Strength 5/5 in patient's upper and lower extremities bilaterally  Skin: Without rashes exudates or nodules  Psych: Normal affect and demeanor with intact judgement and insight  Discharge Instructions Discharge Instructions    Diet - low sodium heart healthy   Complete by: As directed    Discharge instructions   Complete by: As directed    Patient will need to follow up with primary care provider within one week of discharge.  Follow up with cardiology. Repeat CMP in one week.  Patient should continue medications as prescribed.  Patient should follow a heart healthy/carb modified diet.   Increase activity slowly   Complete by: As directed      Allergies as of 10/09/2020      Reactions   Semaglutide Other (See Comments)    Nausea ,vomiting ,back pain      Medication List    STOP taking these medications   acetaminophen 325 MG tablet Commonly known as: TYLENOL   carvedilol 6.25 MG tablet Commonly known as: COREG   dofetilide 125 MCG capsule Commonly known as: TIKOSYN   Potassium Chloride ER 20 MEQ Tbcr     TAKE these medications   amiodarone 200 MG tablet Commonly known as: PACERONE Take 1 tablet (200 mg total) by mouth daily.   atorvastatin 20 MG tablet Commonly known as: LIPITOR Take 20 mg by mouth daily.   B COMPLEX PO Take 1 tablet by mouth daily.   Biotin 10 MG Tabs Take 10 mg by mouth daily. 10 mg - 10,000 mcg   budesonide-formoterol 160-4.5 MCG/ACT inhaler Commonly known as: SYMBICORT Inhale 2 puffs into the lungs 2 (two) times daily.   CALCIUM PO Take 1 tablet by mouth 2 (two) times daily.   cholecalciferol 25 MCG (1000 UNIT) tablet Commonly known as: VITAMIN D3 Take 1,000 Units by mouth 2 (two) times daily.   CHROMIUM PO Take 1 tablet by mouth 2 (two) times daily.   dapagliflozin propanediol 10 MG Tabs tablet Commonly known as: Farxiga Take 1 tablet (10 mg total) by mouth daily before breakfast.   digoxin 0.125 MG tablet Commonly known as: LANOXIN Take 1 tablet (0.125 mg total) by mouth daily.   Eliquis 5 MG Tabs tablet Generic drug: apixaban Take 5 mg by mouth 2 (two) times daily.   escitalopram 10 MG tablet Commonly known as: LEXAPRO TAKE 1 TABLET(10 MG) BY MOUTH DAILY What changed: See the new instructions.   L-Arginine-500 500 MG Caps Generic drug: Arginine Take  500 mg by mouth daily.   L-Carnitine 500 MG Tabs Take 500 mg by mouth daily.   losartan 25 MG tablet Commonly known as: COZAAR Take 0.5 tablets (12.5 mg total) by mouth 2 (two) times daily.   Magnesium 400 MG Tabs Take 400 mg by mouth daily. What changed: when to take this   metFORMIN 500 MG tablet Commonly known as: GLUCOPHAGE Take 1-3 tablets (500-1,500 mg total) by mouth See admin  instructions. Take one tablet (500 mg) by mouth daily with breakfast and three tablets (1500 mg) daily with supper   milrinone 20 MG/100 ML Soln infusion Commonly known as: PRIMACOR Inject 0.0243 mg/min into the vein continuous.   OneTouch Ultra test strip Generic drug: glucose blood Use as instructed to check blood sugars twice daily What changed: See the new instructions.   potassium chloride SA 20 MEQ tablet Commonly known as: KLOR-CON Take 1 tablet (20 mEq total) by mouth every evening.   potassium chloride SA 20 MEQ tablet Commonly known as: KLOR-CON Take 2 tablets (40 mEq total) by mouth daily.   SAW PALMETTO COMPLEX PO Take 540 mg by mouth daily.   spironolactone 25 MG tablet Commonly known as: ALDACTONE Take 25 mg by mouth daily.   Torsemide 40 MG Tabs Take 80 mg by mouth 2 (two) times daily. What changed:   medication strength  how much to take   vitamin C 1000 MG tablet Take 1,000 mg by mouth 2 (two) times daily.   vitamin E 180 MG (400 UNITS) capsule Take 400 Units by mouth daily.   Zinc 50 MG Tabs Take 50 mg by mouth daily.            Durable Medical Equipment  (From admission, onward)         Start     Ordered   10/08/20 0946  Heart failure home health orders  (Heart failure home health orders / Face to face)  Once       Comments: Heart Failure Follow-up Care:  Verify follow-up appointments per Patient Discharge Instructions. Confirm transportation arranged. Reconcile home medications with discharge medication list. Remove discontinued medications from use. Assist patient/caregiver to manage medications using pill box. Reinforce low sodium food selection Assessments: Vital signs and oxygen saturation at each visit. Assess home environment for safety concerns, caregiver support and availability of low-sodium foods. Consult Child psychotherapist, PT/OT, Dietitian, and CNA based on assessments. Perform comprehensive cardiopulmonary assessment. Notify  MD for any change in condition or weight gain of 3 pounds in one day or 5 pounds in one week with symptoms. Daily Weights and Symptom Monitoring: Ensure patient has access to scales. Teach patient/caregiver to weigh daily before breakfast and after voiding using same scale and record.    Teach patient/caregiver to track weight and symptoms and when to notify Provider. Activity: Develop individualized activity plan with patient/caregiver.   Home Paraenteral Inotropic Therapy : Data Collection Form  Patients name: Chad Wilkerson   Date: 10/08/20  Information below may not be completed by the supplier nor anyone in a Financial relationship with the supplier.  1. Results of invasive hemodynamic monitoring  Cardiac Index Before Inotrope infusion:            1.6            On Inotrope infusion: 2     Drug and dose:   Milrinone 0.25 mcg   2. Cardiac medications immediately prior to inotrope infusion (List name, dose, and frequency)  none  3. Dose this represent maximum tolerated doses of these medications? Yes.  4. Breathing status Prior to inotrope infusion: Dyspnea at rest  At time of discharge: Dyspnea on moderate  exertion.   5. Initial home prescription Drug and Dose:  for continuous infusion 24/hr day and 7 days/week  6. If continuous infusion is prescribed, have attempts to discontinue inotrope infusion in the hospital failed?   Yes.   7. If intermittent infusion is prescribed, have there been repeated hospitalizations for heart failure which Parenteral inotrope were required? Not applicable.   8. Is patient capable of going to the physician for outpatient evaluation? Yes.    9. Is routine electrocardiographic monitoring required in the Home?  No.   The above statements and any additional explanations included separately are true and accurate and there is documentation present in the patients medical record to support these statements.   Completed by Tonye Becket, NP    In instances where this form was completed by an Advanced Practice Provider, please see EMR for physician Co-Signature.  [facility] to provide  Labs every other week to include BMET, Mg, and CBC with Diff. Additional as needed. Should be drawn via PERIPHERAL stick. NOT PICC line.   G8676 Milrinone 0.25 mcg/kg/min X 52 weeks  A4221 Supplies for maintenance of drug infusion catheter A4222 Supplies for the external drug infusion per cassette or bag E0781 Ambulatory Infusion pump  Question Answer Comment  Heart Failure Follow-up Care [facility] at [phone]   Obtain the following labs Basic Metabolic Panel   Lab frequency Weekly   Fax lab results to Other see comments   Diet Low Sodium Heart Healthy   Fluid restrictions: 2000 mL Fluid      10/08/20 0947         Allergies  Allergen Reactions  . Semaglutide Other (See Comments)    Nausea ,vomiting ,back pain    Follow-up Information     [facility] Follow up on 10/18/2020.   Specialty: Cardiology Why: at 1100 Located at [facility] in the [facility].  Contact information: [address] [id number] mc [address] [place] [address] [phone]       [name], NP. Go on 10/15/2020.   Specialty: Family Medicine Why: @2 :00pm Contact information: [address] [place] [phone]        [facility].   Specialty: Home Health Services Why: Registred Nurse- the office will call with a visit time. If you have questions please call [phone]               The results of significant diagnostics from this hospitalization (including imaging, microbiology, ancillary and laboratory) are listed below for reference.    Significant Diagnostic Studies: CARDIAC CATHETERIZATION  Result Date: 10/05/2020 1. Good cardiac output and PAPI on milrinone. 2. Elevated L>R heart filling  pressures. 3. Moderate pulmonary venous hypertension. Will continue milrinone 0.25 for now and restart Lasix gtt, will need further diuresis.  Continue LVAD discussions.   DG Chest Port 1 View  Result Date: 09/28/2020 CLINICAL DATA:  Worsening shortness of breath. EXAM: PORTABLE CHEST 1 VIEW COMPARISON:  None. FINDINGS: Left chest wall pacemaker with single lead terminating in the right ventricle. Moderate cardiomegaly. Pulmonary vascular congestion with mild diffuse interstitial thickening. No focal consolidation, pleural effusion, or pneumothorax. No acute osseous abnormality. IMPRESSION: 1. Cardiomegaly with mild interstitial pulmonary edema. Electronically Signed   By: 09/30/2020  Derry M.D.   On: 09/28/2020 16:51   ECHOCARDIOGRAM COMPLETE  Result Date: 09/28/2020    ECHOCARDIOGRAM REPORT   Patient Name:   Chad Wilkerson Date of Exam: 09/28/2020 Medical Rec #:  [id number]         Height:       68.0 in Accession #:    [id number]        Weight:       231.0 lb Date of Birth:  09-May-1946          BSA:          2.173 m Patient Age:    74 years          BP:           98/67 mmHg Patient Gender: M                 HR:           72 bpm. Exam Location:  [address] Procedure: 2D Echo, Cardiac Doppler and Color Doppler Indications:    I50.22 CHF  History:        Patient has prior history of Echocardiogram examinations, most                 recent 04/23/2020. Risk Factors:Sleep Apnea, Diabetes and                 Hypertension.  Sonographer:    Clearence Ped RCS Referring Phys: [id number] MAHESH A CHANDRASEKHAR IMPRESSIONS  1. Left ventricular ejection fraction, by estimation, is <20%. The left ventricle has severely decreased function. The left ventricle demonstrates global hypokinesis. The left ventricular internal cavity size was severely dilated. Left ventricular diastolic parameters are consistent with Grade III diastolic dysfunction (restrictive).  2. Right ventricular systolic function is moderately reduced. The  right ventricular size is mildly enlarged. There is normal pulmonary artery systolic pressure.  3. Left atrial size was severely dilated.  4. Right atrial size was severely dilated.  5. The mitral valve is grossly normal. Mild to moderate mitral valve regurgitation.  6. Tricuspid valve regurgitation is moderate to severe.  7. The aortic valve is grossly normal. Aortic valve regurgitation is not visualized.  8. Aortic dilatation noted. There is mild dilatation of the ascending aorta, measuring 41 mm. FINDINGS  Left Ventricle: Left ventricular ejection fraction, by estimation, is <20%. The left ventricle has severely decreased function. The left ventricle demonstrates global hypokinesis. The left ventricular internal cavity size was severely dilated. There is no left ventricular hypertrophy. Left ventricular diastolic parameters are consistent with Grade III diastolic dysfunction (restrictive). Right Ventricle: The right ventricular size is mildly enlarged. Right vetricular wall thickness was not well visualized. Right ventricular systolic function is moderately reduced. There is normal pulmonary artery systolic pressure. The tricuspid regurgitant velocity is 1.82 m/s, and with an assumed right atrial pressure of 3 mmHg, the estimated right ventricular systolic pressure is 16.2 mmHg. Left Atrium: Left atrial size was severely dilated. Right Atrium: Right atrial size was severely dilated. Pericardium: There is no evidence of pericardial effusion. Mitral Valve: The mitral valve is grossly normal. There is mild thickening of the mitral valve leaflet(s). There is mild calcification of the mitral valve leaflet(s). Mild mitral annular calcification. Mild to moderate mitral valve regurgitation. Tricuspid Valve: The tricuspid valve is grossly normal. Tricuspid valve regurgitation is moderate to severe. Aortic Valve: The aortic valve is grossly normal. Aortic valve regurgitation is not visualized. Aortic regurgitation PHT  measures 530 msec. Aortic valve mean  gradient measures 9.0 mmHg. Aortic valve peak gradient measures 17.5 mmHg. Aortic valve area, by  VTI measures 0.84 cm. Pulmonic Valve: The pulmonic valve was normal in structure. Pulmonic valve regurgitation is trivial. Aorta: Aortic dilatation noted. There is mild dilatation of the ascending aorta, measuring 41 mm. IAS/Shunts: The atrial septum is grossly normal. Additional Comments: A device lead is visualized.  LEFT VENTRICLE PLAX 2D LVIDd:         6.90 cm  Diastology LVIDs:         6.20 cm  LV e' medial:    4.90 cm/s LV PW:         1.00 cm  LV E/e' medial:  20.3 LV IVS:        0.80 cm  LV e' lateral:   8.27 cm/s LVOT diam:     2.20 cm  LV E/e' lateral: 12.0 LV SV:         35 LV SV Index:   16 LVOT Area:     3.80 cm  RIGHT VENTRICLE RV Basal diam:  5.50 cm RV S prime:     5.00 cm/s TAPSE (M-mode): 1.9 cm RVSP:           16.2 mmHg LEFT ATRIUM              Index       RIGHT ATRIUM           Index LA diam:        5.70 cm  2.62 cm/m  RA Pressure: 3.00 mmHg LA Vol (A2C):   152.0 ml 69.96 ml/m RA Area:     28.30 cm LA Vol (A4C):   70.6 ml  32.49 ml/m RA Volume:   96.70 ml  44.50 ml/m LA Biplane Vol: 105.0 ml 48.32 ml/m  AORTIC VALVE AV Area (Vmax):    0.84 cm AV Area (Vmean):   0.85 cm AV Area (VTI):     0.84 cm AV Vmax:           209.00 cm/s AV Vmean:          142.000 cm/s AV VTI:            0.417 m AV Peak Grad:      17.5 mmHg AV Mean Grad:      9.0 mmHg LVOT Vmax:         46.30 cm/s LVOT Vmean:        31.700 cm/s LVOT VTI:          0.092 m LVOT/AV VTI ratio: 0.22 AI PHT:            530 msec  AORTA Ao Root diam: 3.20 cm Ao Asc diam:  4.10 cm MITRAL VALVE                 TRICUSPID VALVE MV Area (PHT):               TR Peak grad:   13.2 mmHg MV Decel Time:               TR Vmax:        182.00 cm/s MR Peak grad:    55.7 mmHg   Estimated RAP:  3.00 mmHg MR Mean grad:    35.0 mmHg   RVSP:           16.2 mmHg MR Vmax:         373.00 cm/s MR Vmean:        273.0 cm/s  SHUNTS  MR PISA:         4.02 cm    Systemic VTI:  0.09 m MR PISA Eff ROA: 38 mm      Systemic Diam: 2.20 cm MR PISA Radius:  0.80 cm MV E velocity: 99.40 cm/s MV A velocity: 43.30 cm/s MV E/A ratio:  2.30 Kristeen Miss MD Electronically signed by Kristeen Miss MD Signature Date/Time: 09/28/2020/3:45:27 PM    Final    VAS US CAROTID  Result Date: 10/06/2020 Carotid Arterial Duplex Study Patient Name:  Chad Wilkerson  Date of Exam:   10/06/2020 Medical Rec #: [id number]          Accession #:    [id number] Date of Birth: 04-17-1946           Patient Gender: M Patient Age:   2Y Exam [facility]  [facility] Procedure:      VAS US CAROTID Referring Phys: [id number] [facility] --------------------------------------------------------------------------------  Indications:       Pre-operative evaluation for VAD. Risk Factors:      Hypertension, Diabetes. Comparison Study:  No prior study Performing Technologist: Jean Rosenthal RDMS,RVT  Examination Guidelines: A complete evaluation includes B-mode imaging, spectral Doppler, color Doppler, and power Doppler as needed of all accessible portions of each vessel. Bilateral testing is considered an integral part of a complete examination. Limited examinations for reoccurring indications may be performed as noted.  Right Carotid Findings: +----------+--------+--------+--------+-----------------------+--------+           PSV cm/sEDV cm/sStenosisPlaque Description     Comments +----------+--------+--------+--------+-----------------------+--------+ CCA Prox  46      9                                               +----------+--------+--------+--------+-----------------------+--------+ CCA Distal40      12              heterogenous and smooth         +----------+--------+--------+--------+-----------------------+--------+ ICA Prox  34      12              smooth and heterogenous          +----------+--------+--------+--------+-----------------------+--------+ ICA Distal48      17                                              +----------+--------+--------+--------+-----------------------+--------+ ECA       70      9                                               +----------+--------+--------+--------+-----------------------+--------+ +----------+--------+-------+----------------+-------------------+           PSV cm/sEDV cmsDescribe        Arm Pressure (mmHG) +----------+--------+-------+----------------+-------------------+ [id number]            Multiphasic, WNL                    +----------+--------+-------+----------------+-------------------+ +---------+--------+--+--------+--+---------+ VertebralPSV cm/s35EDV cm/s11Antegrade +---------+--------+--+--------+--+---------+  Left Carotid Findings: +----------+--------+--------+--------+-----------------------+--------+           PSV cm/sEDV cm/sStenosisPlaque Description     Comments +----------+--------+--------+--------+-----------------------+--------+ CCA Prox  83  18                                              +----------+--------+--------+--------+-----------------------+--------+ CCA Distal47      13                                              +----------+--------+--------+--------+-----------------------+--------+ ICA Prox  32      9               smooth and heterogenous         +----------+--------+--------+--------+-----------------------+--------+ ICA Distal42      11                                              +----------+--------+--------+--------+-----------------------+--------+ ECA       70      12                                              +----------+--------+--------+--------+-----------------------+--------+ +----------+--------+--------+----------------+-------------------+           PSV cm/sEDV cm/sDescribe        Arm Pressure (mmHG)  +----------+--------+--------+----------------+-------------------+ Subclavian119             Multiphasic, WNL                    +----------+--------+--------+----------------+-------------------+ +---------+--------+--+--------+--+---------+ VertebralPSV cm/s41EDV cm/s11Antegrade +---------+--------+--+--------+--+---------+   Summary: Right Carotid: Velocities in the right ICA are consistent with a 1-39% stenosis. Left Carotid: Velocities in the left ICA are consistent with a 1-39% stenosis. Vertebrals:  Bilateral vertebral arteries demonstrate antegrade flow. Subclavians: Normal flow hemodynamics were seen in bilateral subclavian              arteries. *See table(s) above for measurements and observations.  Electronically signed by Coral Else MD on 10/06/2020 at 6:16:40 PM.    Final    VAS [place] LOWER EXTREMITY VENOUS (DVT)  Result Date: 10/06/2020  Lower Venous DVT Study Patient Name:  Chad Wilkerson  Date of Exam:   10/06/2020 Medical Rec #: [id number]          Accession #:    [id number] Date of Birth: [birth year]           Patient Gender: M Patient Age:   41Y Exam Location:  [facility] [facility] Procedure:      VAS [place] LOWER EXTREMITY VENOUS (DVT) Referring Phys: [id number] MCLEAN --------------------------------------------------------------------------------  Indications: Pre-op, and VAD evaluation.  Comparison Study: No prior study Performing Technologist: Jean Rosenthal RDMS,RVT  Examination Guidelines: A complete evaluation includes B-mode imaging, spectral Doppler, color Doppler, and power Doppler as needed of all accessible portions of each vessel. Bilateral testing is considered an integral part of a complete examination. Limited examinations for reoccurring indications may be performed as noted. The reflux portion of the exam is performed with the patient in reverse Trendelenburg.  +---------+---------------+---------+-----------+----------+--------------+ RIGHT     CompressibilityPhasicitySpontaneityPropertiesThrombus Aging +---------+---------------+---------+-----------+----------+--------------+ CFV      Full  Yes      Yes                                 +---------+---------------+---------+-----------+----------+--------------+ SFJ      Full                                                        +---------+---------------+---------+-----------+----------+--------------+ FV Prox  Full                                                        +---------+---------------+---------+-----------+----------+--------------+ FV Mid   Full                                                        +---------+---------------+---------+-----------+----------+--------------+ FV DistalFull                                                        +---------+---------------+---------+-----------+----------+--------------+ PFV      Full                                                        +---------+---------------+---------+-----------+----------+--------------+ POP      Full           Yes      Yes                                 +---------+---------------+---------+-----------+----------+--------------+ PTV      Full                                                        +---------+---------------+---------+-----------+----------+--------------+ PERO     Full                                                        +---------+---------------+---------+-----------+----------+--------------+   +---------+---------------+---------+-----------+----------+--------------+ LEFT     CompressibilityPhasicitySpontaneityPropertiesThrombus Aging +---------+---------------+---------+-----------+----------+--------------+ CFV      Full           Yes      Yes                                 +---------+---------------+---------+-----------+----------+--------------+ SFJ  Full                                                         +---------+---------------+---------+-----------+----------+--------------+ FV Prox  Full                                                        +---------+---------------+---------+-----------+----------+--------------+ FV Mid   Full                                                        +---------+---------------+---------+-----------+----------+--------------+ FV DistalFull                                                        +---------+---------------+---------+-----------+----------+--------------+ PFV      Full                                                        +---------+---------------+---------+-----------+----------+--------------+ POP      Full           Yes      Yes                                 +---------+---------------+---------+-----------+----------+--------------+ PTV      Full                                                        +---------+---------------+---------+-----------+----------+--------------+ PERO     Full                                                        +---------+---------------+---------+-----------+----------+--------------+     Summary: RIGHT: - There is no evidence of deep vein thrombosis in the lower extremity.  - No cystic structure found in the popliteal fossa.  LEFT: - There is no evidence of deep vein thrombosis in the lower extremity.  - No cystic structure found in the popliteal fossa.  *See table(s) above for measurements and observations. Electronically signed by Coral Else MD on 10/06/2020 at 6:17:32 PM.    Final    [place] EKG SITE RITE  Result Date: 09/29/2020 If Site Rite image not attached, placement could not be confirmed due to current cardiac rhythm.  CT CHEST ABDOMEN PELVIS WO CONTRAST  Result Date: 10/09/2020 CLINICAL DATA:  Preoperative assessment for LVAD placement. EXAM: CT CHEST, ABDOMEN AND PELVIS WITHOUT CONTRAST TECHNIQUE: Multidetector CT imaging of the chest, abdomen  and pelvis was performed following the standard protocol without IV contrast. COMPARISON:  None. FINDINGS: CT CHEST FINDINGS Cardiovascular: The heart is enlarged. No substantial pericardial effusion. Right PICC line tip is positioned in the mid SVC. Single lead pacer/AICD noted. Coronary artery calcification is evident. Atherosclerotic calcification is noted in the wall of the thoracic aorta. Enlargement of the pulmonary outflow tract and main pulmonary arteries suggests pulmonary arterial hypertension. Mediastinum/Nodes: Mediastinal lymph nodes are upper limits of normal for size. 1 of the more dominant lymph nodes in the right paratracheal space measures 11 mm short axis, borderline enlarged. No evidence for gross hilar lymphadenopathy although assessment is limited by the lack of intravenous contrast on today's study. The esophagus has normal imaging features. There is no axillary lymphadenopathy. Lungs/Pleura: No focal airspace consolidation. No suspicious pulmonary nodule or mass. Subsegmental atelectasis noted in the left lower lobe. Musculoskeletal: No worrisome lytic or sclerotic osseous abnormality. CT ABDOMEN PELVIS FINDINGS Hepatobiliary: No focal abnormality in the liver on this study without intravenous contrast. Gallbladder surgically absent. No intrahepatic or extrahepatic biliary dilation. Pancreas: No focal mass lesion. No dilatation of the main duct. No intraparenchymal cyst. No peripancreatic edema. Spleen: No splenomegaly. No focal mass lesion. Adrenals/Urinary Tract: No adrenal nodule or mass. Right kidney unremarkable. 3.8 cm exophytic lesion lower pole left kidney measures water density, compatible with a cyst. No evidence for hydroureter. The urinary bladder appears normal for the degree of distention. Stomach/Bowel: Stomach is unremarkable. No gastric wall thickening. No evidence of outlet obstruction. Duodenum is normally positioned as is the ligament of Treitz. No small bowel wall  thickening. No small bowel dilatation. The terminal ileum is normal. The appendix is best seen on coronal images and is unremarkable. Diverticuli are seen scattered along the entire length of the colon without CT findings of diverticulitis. Vascular/Lymphatic: There is abdominal aortic atherosclerosis without aneurysm. There is no gastrohepatic or hepatoduodenal ligament lymphadenopathy. No retroperitoneal or mesenteric lymphadenopathy. No pelvic sidewall lymphadenopathy. Reproductive: The prostate gland and seminal vesicles are unremarkable. Other: No intraperitoneal free fluid. Musculoskeletal: No worrisome lytic or sclerotic osseous abnormality. Degenerative disc disease noted lumbar spine. IMPRESSION: 1. No acute findings in the chest, abdomen, or pelvis. 2. Enlargement of the pulmonary outflow tract and main pulmonary arteries suggests pulmonary arterial hypertension. 3. Borderline mediastinal lymphadenopathy, likely reactive. Conservatively, follow-up chest CT in 3 months could be used to ensure stability. 4. Left renal cyst. 5. Aortic Atherosclerosis (ICD10-I70.0). Electronically Signed   By: Kennith CenterEric  Mansell M.D.   On: 10/09/2020 08:16   US Abdomen Limited RUQ (LIVER/GB)  Result Date: 10/07/2020 CLINICAL DATA:  Elevated liver function tests. History of cholecystectomy. EXAM: ULTRASOUND ABDOMEN LIMITED RIGHT UPPER QUADRANT COMPARISON:  None. FINDINGS: Gallbladder: Surgically absent. Common bile duct: Diameter: 3 mm Liver: Liver surface is finely irregular. Liver parenchymal echotexture is mildly coarsened. No liver masses. Portal vein is patent on color Doppler imaging with normal direction of blood flow towards the liver. Other: None. IMPRESSION: 1. Finely irregular liver surface with mildly coarsened liver parenchymal echotexture, suggesting cirrhosis. No liver masses. 2. No biliary ductal dilatation status post cholecystectomy. Electronically Signed   By: Delbert PhenixJason A Poff M.D.   On: 10/07/2020 11:22     Microbiology: No results found for this or any previous visit (from the past 240 hour(s)).   Labs: Basic Metabolic Panel: Recent Labs  Lab 10/03/20 1700 10/04/20  1610 10/04/20 1701 10/05/20 0418 10/05/20 1256 10/05/20 1706 10/06/20 0349 10/06/20 1900 10/07/20 0500 10/08/20 0500 10/09/20 0500  NA 129* 124*   < > 134*   < > 135 134* 133* 132* 132* 126*  K 3.0* 3.0*   < > 3.4*   < > 3.3* 3.3* 3.2* 3.6 3.7 3.3*  CL 86* 84*   < > 92*  --  94* 93* 93* 95* 93* 85*  CO2 28 29   < > 31  --  29 30 29 28 29 28   GLUCOSE 296* 378*   < > 157*  --  250* 211* 179* 175* 143* 234*  BUN 30* 28*   < > 32*  --  31* 32* 27* 25* 25* 34*  CREATININE 1.40* 1.15   < > 1.10  --  1.05 1.00 1.14 1.00 1.05 1.17  CALCIUM 9.0 8.7*   < > 9.6  --  9.1 9.1 8.9 8.7* 8.9 8.8*  MG 2.2 2.1  --  2.2  --  2.3 2.1  --   --   --   --   PHOS  --   --   --  4.8*  --   --  4.2  --   --   --   --    < > = values in this interval not displayed.   Liver Function Tests: Recent Labs  Lab 10/05/20 0418 10/06/20 0349 10/07/20 0500 10/08/20 0500 10/09/20 0500  AST 58* 89* 150* 79* 63*  ALT 60* 93* 143* 111* 98*  ALKPHOS 108 115 129* 121 114  BILITOT 2.2* 1.6* 1.3* 1.4* 1.6*  PROT 8.1 7.5 7.5 7.6 7.8  ALBUMIN 3.8 3.5 3.4* 3.5 3.6   No results for input(s): LIPASE, AMYLASE in the last 168 hours. No results for input(s): AMMONIA in the last 168 hours. CBC: Recent Labs  Lab 10/05/20 0418 10/05/20 1256 10/05/20 1257 10/06/20 0349 10/07/20 0500 10/08/20 0500 10/09/20 0500  WBC 8.7  --   --  8.6 8.7 9.3 8.7  NEUTROABS 6.4  --   --   --   --   --   --   HGB 15.4   < > 16.0 15.5 15.2 15.2 15.5  HCT 45.1   < > 47.0 47.2 46.3 45.9 45.9  MCV 85.9  --   --  87.6 87.7 87.3 85.5  PLT 293  --   --  298 302 310 288   < > = values in this interval not displayed.   Cardiac Enzymes: No results for input(s): CKTOTAL, CKMB, CKMBINDEX, TROPONINI in the last 168 hours. BNP: BNP (last 3 results) Recent Labs     09/28/20 1613  BNP 3,328.4*    ProBNP (last 3 results) Recent Labs    04/01/20 1509  PROBNP 5,001*    CBG: Recent Labs  Lab 10/08/20 0652 10/08/20 1111 10/08/20 1605 10/08/20 2145 10/09/20 0621  GLUCAP 181* 132* 157* 198* 176*       Signed:  Selim Durden  [facility] 10/09/2020, 9:15 AM

## 2020-10-10 ENCOUNTER — Encounter (HOSPITAL_COMMUNITY): Payer: Self-pay | Admitting: Emergency Medicine

## 2020-10-10 ENCOUNTER — Emergency Department (HOSPITAL_COMMUNITY)
Admission: EM | Admit: 2020-10-10 | Discharge: 2020-10-11 | Disposition: A | Discharge status: Home / Self Care | Payer: Medicare Other | Attending: Emergency Medicine | Admitting: Emergency Medicine

## 2020-10-10 DIAGNOSIS — Z7901 Long term (current) use of anticoagulants: Secondary | ICD-10-CM | POA: Insufficient documentation

## 2020-10-10 DIAGNOSIS — I6782 Cerebral ischemia: Secondary | ICD-10-CM | POA: Insufficient documentation

## 2020-10-10 DIAGNOSIS — Z7984 Long term (current) use of oral hypoglycemic drugs: Secondary | ICD-10-CM | POA: Insufficient documentation

## 2020-10-10 DIAGNOSIS — T82594A Other mechanical complication of infusion catheter, initial encounter: Secondary | ICD-10-CM | POA: Insufficient documentation

## 2020-10-10 DIAGNOSIS — Z87891 Personal history of nicotine dependence: Secondary | ICD-10-CM | POA: Diagnosis not present

## 2020-10-10 DIAGNOSIS — I5043 Acute on chronic combined systolic (congestive) and diastolic (congestive) heart failure: Secondary | ICD-10-CM | POA: Diagnosis not present

## 2020-10-10 DIAGNOSIS — T82598A Other mechanical complication of other cardiac and vascular devices and implants, initial encounter: Secondary | ICD-10-CM

## 2020-10-10 DIAGNOSIS — E119 Type 2 diabetes mellitus without complications: Secondary | ICD-10-CM | POA: Diagnosis not present

## 2020-10-10 DIAGNOSIS — Z79899 Other long term (current) drug therapy: Secondary | ICD-10-CM | POA: Insufficient documentation

## 2020-10-10 DIAGNOSIS — I110 Hypertensive heart disease with heart failure: Secondary | ICD-10-CM | POA: Insufficient documentation

## 2020-10-10 MED ORDER — VITAMIN D 25 MCG (1000 UNIT) PO TABS
1000.0000 [IU] | ORAL_TABLET | Freq: Two times a day (BID) | ORAL | Status: DC
  Administered 2020-10-11 (×2): 1000 [IU] via ORAL
  Filled 2020-10-10 (×2): qty 1

## 2020-10-10 MED ORDER — MOMETASONE FURO-FORMOTEROL FUM 200-5 MCG/ACT IN AERO: 2.0000 | INHALATION_SPRAY | Freq: Two times a day (BID) | RESPIRATORY_TRACT | Status: DC

## 2020-10-10 MED ORDER — SPIRONOLACTONE 25 MG PO TABS
25.0000 mg | ORAL_TABLET | Freq: Every day | ORAL | Status: DC
  Administered 2020-10-11: 25 mg via ORAL
  Filled 2020-10-10: qty 1

## 2020-10-10 MED ORDER — POTASSIUM CHLORIDE CRYS ER 20 MEQ PO TBCR
20.0000 meq | EXTENDED_RELEASE_TABLET | Freq: Every evening | ORAL | Status: DC
  Administered 2020-10-11: 20 meq via ORAL
  Filled 2020-10-10: qty 1

## 2020-10-10 MED ORDER — L-CARNITINE 500 MG PO TABS: 500.0000 mg | ORAL_TABLET | Freq: Every day | ORAL | Status: DC

## 2020-10-10 MED ORDER — METFORMIN HCL 500 MG PO TABS: 1500.0000 mg | ORAL_TABLET | Freq: Every day | ORAL | Status: DC

## 2020-10-10 MED ORDER — DIGOXIN 125 MCG PO TABS
0.1250 mg | ORAL_TABLET | Freq: Every day | ORAL | Status: DC
  Administered 2020-10-11: 0.125 mg via ORAL
  Filled 2020-10-10: qty 1

## 2020-10-10 MED ORDER — MAGNESIUM OXIDE -MG SUPPLEMENT 400 (240 MG) MG PO TABS
400.0000 mg | ORAL_TABLET | Freq: Two times a day (BID) | ORAL | Status: DC
  Administered 2020-10-11 (×2): 400 mg via ORAL
  Filled 2020-10-10 (×2): qty 1

## 2020-10-10 MED ORDER — METFORMIN HCL 500 MG PO TABS: 500.0000 mg | ORAL_TABLET | ORAL | Status: DC

## 2020-10-10 MED ORDER — APIXABAN 5 MG PO TABS
5.0000 mg | ORAL_TABLET | Freq: Two times a day (BID) | ORAL | Status: DC
  Administered 2020-10-11 (×2): 5 mg via ORAL
  Filled 2020-10-10 (×2): qty 1

## 2020-10-10 MED ORDER — TORSEMIDE 20 MG PO TABS
80.0000 mg | ORAL_TABLET | Freq: Two times a day (BID) | ORAL | Status: DC
  Administered 2020-10-11: 80 mg via ORAL
  Filled 2020-10-10: qty 4

## 2020-10-10 MED ORDER — ESCITALOPRAM OXALATE 10 MG PO TABS
10.0000 mg | ORAL_TABLET | Freq: Every day | ORAL | Status: DC
  Administered 2020-10-11: 10 mg via ORAL
  Filled 2020-10-10: qty 1

## 2020-10-10 MED ORDER — POTASSIUM CHLORIDE CRYS ER 20 MEQ PO TBCR
40.0000 meq | EXTENDED_RELEASE_TABLET | Freq: Every day | ORAL | Status: DC
  Administered 2020-10-11: 40 meq via ORAL
  Filled 2020-10-10: qty 2

## 2020-10-10 MED ORDER — METFORMIN HCL 500 MG PO TABS
500.0000 mg | ORAL_TABLET | Freq: Every day | ORAL | Status: DC
  Administered 2020-10-11: 500 mg via ORAL
  Filled 2020-10-10: qty 1

## 2020-10-10 MED ORDER — ATORVASTATIN CALCIUM 10 MG PO TABS
20.0000 mg | ORAL_TABLET | Freq: Every day | ORAL | Status: DC
  Administered 2020-10-11: 20 mg via ORAL
  Filled 2020-10-10: qty 2

## 2020-10-10 MED ORDER — MILRINONE LACTATE IN DEXTROSE 20-5 MG/100ML-% IV SOLN
0.2500 ug/kg/min | INTRAVENOUS | Status: DC
  Administered 2020-10-10 – 2020-10-11 (×2): 0.25 ug/kg/min via INTRAVENOUS
  Filled 2020-10-10 (×2): qty 100

## 2020-10-10 MED ORDER — AMIODARONE HCL 200 MG PO TABS
200.0000 mg | ORAL_TABLET | Freq: Every day | ORAL | Status: DC
  Administered 2020-10-11: 200 mg via ORAL
  Filled 2020-10-10: qty 1

## 2020-10-10 MED ORDER — DAPAGLIFLOZIN PROPANEDIOL 10 MG PO TABS
10.0000 mg | ORAL_TABLET | Freq: Every day | ORAL | Status: DC
  Administered 2020-10-11: 10 mg via ORAL
  Filled 2020-10-10 (×2): qty 1

## 2020-10-10 MED ORDER — ARGININE 500 MG PO CAPS: 500.0000 mg | ORAL_CAPSULE | Freq: Every day | ORAL | Status: DC

## 2020-10-10 MED ORDER — VITAMIN E 45 MG (100 UNIT) PO CAPS
400.0000 [IU] | ORAL_CAPSULE | Freq: Every day | ORAL | Status: DC
  Administered 2020-10-11: 400 [IU] via ORAL
  Filled 2020-10-10: qty 4

## 2020-10-10 MED ORDER — LOSARTAN POTASSIUM 25 MG PO TABS
12.5000 mg | ORAL_TABLET | Freq: Two times a day (BID) | ORAL | Status: DC
  Administered 2020-10-11 (×2): 12.5 mg via ORAL
  Filled 2020-10-10 (×3): qty 0.5
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
## 2020-10-10 NOTE — Discharge Instructions (Addendum)
IV tubing replaced, continue milrinone and all other medications as directed.

## 2020-10-11 ENCOUNTER — Emergency Department (HOSPITAL_COMMUNITY): Payer: Medicare Other

## 2020-10-11 ENCOUNTER — Telehealth: Payer: Self-pay | Admitting: Adult Health

## 2020-10-11 ENCOUNTER — Telehealth: Payer: Self-pay

## 2020-10-11 DIAGNOSIS — T82594A Other mechanical complication of infusion catheter, initial encounter: Secondary | ICD-10-CM | POA: Diagnosis not present

## 2020-10-11 NOTE — ED Notes (Signed)
Pt received breakfast tray 

## 2020-10-11 NOTE — ED Notes (Signed)
Su Grand, [phone] would like an update when available

## 2020-10-11 NOTE — Telephone Encounter (Signed)
FYI

## 2020-10-11 NOTE — Telephone Encounter (Signed)
Cindy w/[facility] is calling stating that they had an order for Social Worker and the pt declined to have the service done.

## 2020-10-11 NOTE — ED Notes (Signed)
Breakfast order placed ?

## 2020-10-11 NOTE — Telephone Encounter (Signed)
Attempted TCM call.Patient's wife answered & said he is at the ER right now.

## 2020-10-11 NOTE — ED Notes (Signed)
Pam here to replace PICC tubing.

## 2020-10-11 NOTE — ED Notes (Addendum)
Wife entered pts room and came out to inform staff that pt was on the floor. Call bed was within reach. Pt stated he had used urinal independently prior to being moved to room 40. Dime sized abrasion noted to right knee. Pt denies any pain. Pt denies striking head. Socks placed on pt. Pt asked to please use call bell for assistance. Dr. Shon Hale Notified.

## 2020-10-11 NOTE — ED Notes (Signed)
Patient transported to CT via stretcher in stable condition 

## 2020-10-12 ENCOUNTER — Telehealth: Payer: Self-pay

## 2020-10-12 NOTE — Telephone Encounter (Signed)
Transition Care Management Follow-up Telephone Call  Date of discharge and from where: 10/09/[facility]  How have you been since you were released from the hospital? Pretty good  Any questions or concerns? No  Items Reviewed:  Did the pt receive and understand the discharge instructions provided? Yes   Medications obtained and verified? Yes except for Potassium. Patient states pharmacy is working on this prescription now.  Other? Yes   Any new allergies since your discharge? Yes pt\atient's caregiver says he had a reaction to a med in the hospital but she doesn't know the name  Dietary orders reviewed? Yes  Do you have support at home? Yes   Home Care and Equipment/Supplies: Were home health services ordered? yes If so, what is the name of the agency? [facility]  Has the agency set up a time to come to the patient's home? yes -Infusion nurse is coming on Fridays-per caregiver Were any new equipment or medical supplies ordered?  No What is the name of the medical supply agency? n/a Were you able to get the supplies/equipment? not applicable Do you have any questions related to the use of the equipment or supplies? n/a  Functional Questionnaire: (I = Independent and D = Dependent) ADLs: I  Bathing/Dressing- I  Meal Prep- I  Eating- I  Maintaining continence- I  Transferring/Ambulation- I  Managing Meds- I  Follow up appointments reviewed:   PCP Hospital f/u appt confirmed? Yes  Scheduled to see Angela Adam on 10/15/20 @ 2:00.  Specialist Hospital f/u appt confirmed? Yes  Scheduled to see vascular on 10/18/20 @ 11:00.  Are transportation arrangements needed? No   If their condition worsens, is the pt aware to call PCP or go to the Emergency Dept.? Yes  Was the patient provided with contact information for the PCP's office or ED? Yes  Was to pt encouraged to call back with questions or concerns? Yes

## 2020-10-13 ENCOUNTER — Telehealth: Payer: Self-pay | Admitting: Pharmacist

## 2020-10-13 NOTE — Chronic Care Management (AMB) (Signed)
Chronic Care Management Pharmacy Assistant   Name: Chad Wilkerson  MRN: [id number] DOB: Oct 11, 1945  Reason for Encounter: Patient Assistance Coordination    Medications: Outpatient Encounter Medications as of 10/13/2020  Medication Sig Note  . amiodarone (PACERONE) 200 MG tablet Take 1 tablet (200 mg total) by mouth daily.   . Arginine (L-ARGININE-500) 500 MG CAPS Take 500 mg by mouth daily.   . Ascorbic Acid (VITAMIN C) 1000 MG tablet Take 1,000 mg by mouth 2 (two) times daily.   Marland Kitchen atorvastatin (LIPITOR) 20 MG tablet Take 20 mg by mouth daily.   . B Complex Vitamins (B COMPLEX PO) Take 1 tablet by mouth daily.   . Biotin 10 MG TABS Take 10 mg by mouth daily. 10 mg - 10,000 mcg   . budesonide-formoterol (SYMBICORT) 160-4.5 MCG/ACT inhaler Inhale 2 puffs into the lungs 2 (two) times daily.   Marland Kitchen CALCIUM PO Take 1 tablet by mouth 2 (two) times daily.   . cholecalciferol (VITAMIN D3) 25 MCG (1000 UNIT) tablet Take 1,000 Units by mouth 2 (two) times daily.   . CHROMIUM PO Take 1 tablet by mouth 2 (two) times daily.   . dapagliflozin propanediol (FARXIGA) 10 MG TABS tablet Take 1 tablet (10 mg total) by mouth daily before breakfast.   . digoxin (LANOXIN) 0.125 MG tablet Take 1 tablet (0.125 mg total) by mouth daily.   Marland Kitchen ELIQUIS 5 MG TABS tablet Take 5 mg by mouth 2 (two) times daily.   Marland Kitchen escitalopram (LEXAPRO) 10 MG tablet TAKE 1 TABLET(10 MG) BY MOUTH DAILY (Patient taking differently: Take 10 mg by mouth daily.)   . levOCARNitine (L-CARNITINE) 500 MG TABS Take 500 mg by mouth daily.   Marland Kitchen losartan (COZAAR) 25 MG tablet Take 0.5 tablets (12.5 mg total) by mouth 2 (two) times daily.   . Magnesium 400 MG TABS Take 400 mg by mouth daily. (Patient taking differently: Take 400 mg by mouth in the morning and at bedtime.)   . metFORMIN (GLUCOPHAGE) 500 MG tablet Take 1-3 tablets (500-1,500 mg total) by mouth See admin instructions. Take one tablet (500 mg) by mouth daily with breakfast and three  tablets (1500 mg) daily with supper (Patient taking differently: Take 500-1,500 mg by mouth See admin instructions. (500 mg) with breakfast and  (1500 mg) daily with supper)   . milrinone (PRIMACOR) 20 MG/100 ML SOLN infusion Inject 0.0243 mg/min into the vein continuous.   [facility] ULTRA test strip Use as instructed to check blood sugars twice daily   . potassium chloride SA (KLOR-CON) 20 MEQ tablet Take 1 tablet (20 mEq total) by mouth every evening. 10/10/2020: Pt has not started this dose.  . potassium chloride SA (KLOR-CON) 20 MEQ tablet Take 2 tablets (40 mEq total) by mouth daily.   Marland Kitchen spironolactone (ALDACTONE) 25 MG tablet Take 25 mg by mouth daily.   Marland Kitchen torsemide 40 MG TABS Take 80 mg by mouth 2 (two) times daily.   . vitamin E 180 MG (400 UNITS) capsule Take 400 Units by mouth daily.   . Zinc 50 MG TABS Take 50 mg by mouth daily.   Marland Kitchen Zn-Pyg Afri-Nettle-Saw Palmet (SAW PALMETTO COMPLEX PO) Take 540 mg by mouth daily.    No facility-administered encounter medications on file as of 10/13/2020.   I called and spoke with Chad Wilkerson) at [employer] about patient applications. I asked the representative to please cancel his application for [employer]. She did so and took the patient off automatic  refill.   Star Rating Drugs:  Dispensed Quantity Pharmacy  Atorvastatin 20 mg 02.22.2022 90 [facility]  Losartan 25 mg 04.30.2022 30 [facility]  Metformin 500 mg 02.28.2022 90 [facility]      Chad Wilkerson, [place] [facility] Concierge [phone]

## 2020-10-14 ENCOUNTER — Other Ambulatory Visit: Payer: Self-pay

## 2020-10-14 LAB — FACTOR 5 LEIDEN

## 2020-10-15 ENCOUNTER — Encounter: Payer: Self-pay | Admitting: Adult Health

## 2020-10-15 ENCOUNTER — Ambulatory Visit (INDEPENDENT_AMBULATORY_CARE_PROVIDER_SITE_OTHER): Payer: Medicare Other | Admitting: Adult Health

## 2020-10-15 VITALS — BP 110/64 | HR 44 | Temp 98.0°F | Ht 68.5 in | Wt 211.0 lb

## 2020-10-15 DIAGNOSIS — E871 Hypo-osmolality and hyponatremia: Secondary | ICD-10-CM | POA: Diagnosis not present

## 2020-10-15 DIAGNOSIS — E1169 Type 2 diabetes mellitus with other specified complication: Secondary | ICD-10-CM

## 2020-10-15 DIAGNOSIS — I10 Essential (primary) hypertension: Secondary | ICD-10-CM

## 2020-10-15 DIAGNOSIS — I5043 Acute on chronic combined systolic (congestive) and diastolic (congestive) heart failure: Secondary | ICD-10-CM

## 2020-10-15 DIAGNOSIS — G4733 Obstructive sleep apnea (adult) (pediatric): Secondary | ICD-10-CM | POA: Diagnosis not present

## 2020-10-15 DIAGNOSIS — E876 Hypokalemia: Secondary | ICD-10-CM

## 2020-10-15 DIAGNOSIS — R7989 Other specified abnormal findings of blood chemistry: Secondary | ICD-10-CM

## 2020-10-15 DIAGNOSIS — I4891 Unspecified atrial fibrillation: Secondary | ICD-10-CM

## 2020-10-15 LAB — COMPREHENSIVE METABOLIC PANEL WITH GFR
ALT: 82 U/L — ABNORMAL HIGH (ref 0–53)
AST: 61 U/L — ABNORMAL HIGH (ref 0–37)
Albumin: 4.6 g/dL (ref 3.5–5.2)
Alkaline Phosphatase: 115 U/L (ref 39–117)
BUN: 60 mg/dL — ABNORMAL HIGH (ref 6–23)
CO2: 28 meq/L (ref 19–32)
Calcium: 9.8 mg/dL (ref 8.4–10.5)
Chloride: 83 meq/L — ABNORMAL LOW (ref 96–112)
Creatinine, Ser: 1.18 mg/dL (ref 0.40–1.50)
GFR: 60.62 mL/min
Glucose, Bld: 134 mg/dL — ABNORMAL HIGH (ref 70–99)
Potassium: 4.2 meq/L (ref 3.5–5.1)
Sodium: 125 meq/L — ABNORMAL LOW (ref 135–145)
Total Bilirubin: 1.2 mg/dL (ref 0.2–1.2)
Total Protein: 8.6 g/dL — ABNORMAL HIGH (ref 6.0–8.3)

## 2020-10-15 NOTE — Telephone Encounter (Signed)
Called patient to discuss his concerns.  Left a message for him to call the office.

## 2020-10-15 NOTE — Patient Instructions (Signed)
It was great seeing you today   I am going to check some blood work on you today   Please follow up with Cardiology

## 2020-10-15 NOTE — Progress Notes (Signed)
Subjective:    Patient ID: SAVANT VUOLO, male    DOB: [birth year], 75 y.o.   MRN: [id number]  HPI 75 year old male who  has a past medical history of Adjustment disorder, Atrial fibrillation (HCC), Benign colon polyp, CHF (congestive heart failure) (HCC), Diabetes mellitus without complication (HCC), Diverticulitis, Essential hypertension, Heart disease, Hypogonadism male, NICM (nonischemic cardiomyopathy) (HCC) (07/20/2020), Sleep apnea, Urine incontinence, and Vitamin D deficiency.  He presents to the office today for TCM visit   Admit Date 09/28/2020 Discharge Date 10/09/2020  Presented to the emergency room on 09/28/2020 for evaluation of shortness of breath and weight gain.  At this time he reported 1 week of progressive dyspnea, DOE, orthopnea, and weight gain.  He was seen in follow-up in his cardiology office earlier that day and underwent an echocardiogram which showed an EF less than 20.  He had significant shortness of breath therefore was sent to the ED for further management.  In the ER he denied chest pain, subjective fevers, chills, diaphoresis, cough, nausea, vomiting, or dysuria.  Hospital Course   1. 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  Evaded left greater than right heart filling pressures.  Moderate pulmonary venous hypertension.  Recommendations were to continue milrinone and Lasix drip and LVAD discussions -[place] and Lasix drips were discontinued on 427.  Milrinone drip started back on 4/29 and he was transition to IV Lasix -Extremity Doppler as well as carotid ultrasound were unremarkable -Per cardiology, discharge patient with milrinone at home -Medications for home: Eliquis 5 mg twice daily, amiodarone 200 mg daily,  losartan 12.5 mg twice daily, spironolactone 25 mg daily, Farxiga 10 mg daily, digoxin 0.125 mg daily, torsemide 80 mg twice daily, atorvastatin 20 mg daily, potassium 40 mEq every morning, 20 meq every afternoon  2. Ventricular Fibrillation  -Patient with ICD in place, ICD shock on 422 -Milrinone being decreased -Tikosyn discontinued -Currently on amiodarone  3.  Hyponatremia -S/p Tolvaptan per cardiology on 4/25/ -She did receive metolazone on 10/08/2020, which may account for his drop in sodium -Repeat  CMP in 1 week  4. Hypokalemia  - Suspect secondary diuresis  - Replaced during hospital admission  - Repeat CMP   5. CAD -Status post heart catheterization, nonobstructive coronary disease.  Resume statin on discharge  6. PAF -Currently on Eliquis and amiodarone -Coreg held  7.  Diabetes mellitus, type II -Metformin held -Currently on Farxiga -Continued with insulin sliding scale and CBG monitoring Lab Results  Component Value Date   HGBA1C 6.6 (H) 09/29/2020   8. OSA  - Continued on BiPAP nightly.   9.  Elevated  LFTs -AST and ALT trending downwards of upon discharge. -Concerning given amiodarone use -Right upper quadrant ultrasound showed possible cirrhosis.  No biliary ductal dilation -Hepatitis panel unremarkable - Repeat CMP in one week   Today in the office he reports that he is feeling better than when he was in the hospital.  He has a follow-up appointment with the LVAD clinic (plans to go to the appointment, but does not want LVAD at this time) as well as an upcoming appointment with cardiology.  He has been monitoring his blood pressure and weights at home, blood pressure readings 102/62, 100/62, 120/68.  Weight at  home have been between 207 and 211.  He does not appear to be volume overloaded today.  Has no acute complaints    Review of Systems  Respiratory: Negative.   Cardiovascular: Negative.   Gastrointestinal: Negative.   Genitourinary: Negative.    Musculoskeletal: Negative.   Skin: Negative.   Neurological: Negative.   Hematological: Negative.   Psychiatric/Behavioral: Negative.   All other systems reviewed and are negative.  Past Medical History:  Diagnosis Date  . Adjustment disorder   . Atrial fibrillation (HCC)   . Benign colon polyp   . CHF (congestive heart failure) (HCC)   . Diabetes mellitus without complication (HCC)   . Diverticulitis   . Essential hypertension   . Heart disease   . Hypogonadism male   . NICM (nonischemic cardiomyopathy) (HCC) 07/20/2020  . Sleep apnea   . Urine incontinence   . Vitamin D deficiency     Social History   Socioeconomic History  . Marital status: Widowed    Spouse name: Not on file  . Number of children: Not on file  . Years of education: Not on file  . Highest education level: Not on file  Occupational History  . Not on file  Tobacco Use  . Smoking status: Former Games developer  . Smokeless tobacco: Never Used  Vaping Use  . Vaping Use: Never used  Substance and Sexual Activity  . Alcohol use: Not Currently  . Drug use: Not Currently  . Sexual activity: Not on file  Other Topics Concern  . Not on file  Social History Narrative  . Not on file   Social Determinants of Health   Financial Resource Strain: Low Risk   . Difficulty of Paying Living Expenses: Not very hard  Food Insecurity: No Food Insecurity  . Worried About Programme researcher, broadcasting/film/video in the Last Year: Never true  . Ran Out of Food in the Last Year: Never true  Transportation Needs: No Transportation Needs  . Lack of Transportation (Medical): No  . Lack of Transportation (Non-Medical): No  Physical Activity: Inactive  . Days of Exercise per Week: 0 days  . Minutes of Exercise per Session: 0 min  Stress: No Stress Concern Present  . Feeling of Stress : Not at all  Social Connections: Moderately Isolated  . Frequency of Communication with Friends and Family: Once a week  . Frequency of Social Gatherings with  Friends and Family: More than three times a week  . Attends Religious Services: Never  . Active Member of Clubs or Organizations: No  . Attends Banker Meetings: Never  . Marital Status: Living with partner  Intimate Partner Violence: Not At Risk  . Fear of Current or Ex-Partner: No  . Emotionally Abused: No  . Physically Abused: No  . Sexually Abused: No    Past Surgical History:  Procedure Laterality Date  . CHOLECYSTECTOMY    . RIGHT HEART CATH N/A 10/05/2020   Procedure: RIGHT HEART CATH;  Surgeon: [name], MD;  Location: [facility] INVASIVE CV LAB;  Service: Cardiovascular;  Laterality: N/A;  . RIGHT/LEFT HEART CATH AND CORONARY ANGIOGRAPHY N/A 07/19/2020   Procedure: RIGHT/LEFT HEART CATH AND CORONARY ANGIOGRAPHY;  Surgeon: Kathleene Hazel, MD;  Location: [facility] INVASIVE CV LAB;  Service: Cardiovascular;  Laterality: N/A;  . TONSILLECTOMY      Family History  Problem Relation Age of Onset  . Heart attack Mother   . High Cholesterol Mother   . Asthma Father   .  Kidney disease Sister   . Stroke Maternal Grandfather   . Heart attack Paternal Grandfather     Allergies  Allergen Reactions  . Semaglutide Other (See Comments)    Nausea ,vomiting ,back pain    Current Outpatient Medications on File Prior to Visit  Medication Sig Dispense Refill  . amiodarone (PACERONE) 200 MG tablet Take 1 tablet (200 mg total) by mouth daily. 30 tablet 1  . Arginine (L-ARGININE-500) 500 MG CAPS Take 500 mg by mouth daily.    . Ascorbic Acid (VITAMIN C) 1000 MG tablet Take 1,000 mg by mouth 2 (two) times daily.    Marland Kitchen atorvastatin (LIPITOR) 20 MG tablet Take 20 mg by mouth daily.    . B Complex Vitamins (B COMPLEX PO) Take 1 tablet by mouth daily.    . Biotin 10 MG TABS Take 10 mg by mouth daily. 10 mg - 10,000 mcg    . budesonide-formoterol (SYMBICORT) 160-4.5 MCG/ACT inhaler Inhale 2 puffs into the lungs 2 (two) times daily. 30.6 g 3  . CALCIUM PO Take 1 tablet by mouth 2  (two) times daily.    . cholecalciferol (VITAMIN D3) 25 MCG (1000 UNIT) tablet Take 1,000 Units by mouth 2 (two) times daily.    . CHROMIUM PO Take 1 tablet by mouth 2 (two) times daily.    . dapagliflozin propanediol (FARXIGA) 10 MG TABS tablet Take 1 tablet (10 mg total) by mouth daily before breakfast. 28 tablet 0  . digoxin (LANOXIN) 0.125 MG tablet Take 1 tablet (0.125 mg total) by mouth daily. 30 tablet 1  . ELIQUIS 5 MG TABS tablet Take 5 mg by mouth 2 (two) times daily.    Marland Kitchen escitalopram (LEXAPRO) 10 MG tablet TAKE 1 TABLET(10 MG) BY MOUTH DAILY (Patient taking differently: Take 10 mg by mouth daily.) 90 tablet 0  . levOCARNitine (L-CARNITINE) 500 MG TABS Take 500 mg by mouth daily.    Marland Kitchen losartan (COZAAR) 25 MG tablet Take 0.5 tablets (12.5 mg total) by mouth 2 (two) times daily. 30 tablet 1  . Magnesium 400 MG TABS Take 400 mg by mouth daily. (Patient taking differently: Take 400 mg by mouth in the morning and at bedtime.) 60 tablet 3  . metFORMIN (GLUCOPHAGE) 500 MG tablet Take 1-3 tablets (500-1,500 mg total) by mouth See admin instructions. Take one tablet (500 mg) by mouth daily with breakfast and three tablets (1500 mg) daily with supper (Patient taking differently: Take 500-1,500 mg by mouth See admin instructions. (500 mg) with breakfast and  (1500 mg) daily with supper)    . milrinone (PRIMACOR) 20 MG/100 ML SOLN infusion Inject 0.0243 mg/min into the vein continuous.    Letta Pate ULTRA test strip Use as instructed to check blood sugars twice daily 600 each 3  . potassium chloride SA (KLOR-CON) 20 MEQ tablet Take 1 tablet (20 mEq total) by mouth every evening. 30 tablet 1  . potassium chloride SA (KLOR-CON) 20 MEQ tablet Take 2 tablets (40 mEq total) by mouth daily. 30 tablet 1  . spironolactone (ALDACTONE) 25 MG tablet Take 25 mg by mouth daily.    Marland Kitchen torsemide 40 MG TABS Take 80 mg by mouth 2 (two) times daily. 120 tablet 1  . vitamin E 180 MG (400 UNITS) capsule Take 400 Units by  mouth daily.    . Zinc 50 MG TABS Take 50 mg by mouth daily.    Marland Kitchen Zn-Pyg Afri-Nettle-Saw Palmet (SAW PALMETTO COMPLEX PO) Take 540 mg by mouth daily.  No current facility-administered medications on file prior to visit.    BP 110/64 (BP Location: Left Arm, Patient Position: Sitting, Cuff Size: Normal)   Pulse (!) 44   Temp 98 F (36.7 C) (Oral)   Ht 5' 8.5" (1.74 m)   Wt 211 lb (95.7 kg)   SpO2 93%   BMI 31.62 kg/m       Objective:   Physical Exam Vitals and nursing note reviewed.  Constitutional:      Appearance: Normal appearance.  Eyes:     Extraocular Movements: Extraocular movements intact.     Pupils: Pupils are equal, round, and reactive to light.  Cardiovascular:     Rate and Rhythm: Normal rate and regular rhythm.     Pulses: Normal pulses.  Pulmonary:     Effort: Pulmonary effort is normal.     Breath sounds: Normal breath sounds.  Musculoskeletal:        General: Normal range of motion.  Skin:    General: Skin is warm and dry.     Capillary Refill: Capillary refill takes less than 2 seconds.  Neurological:     General: No focal deficit present.     Mental Status: He is alert and oriented to person, place, and time.  Psychiatric:        Mood and Affect: Mood normal.        Behavior: Behavior normal.        Thought Content: Thought content normal.        Judgment: Judgment normal.       Assessment & Plan:  1. Acute on chronic combined systolic (congestive) and diastolic (congestive) heart failure [facility]) - Reviewed hospital admission note, imaging, labs, and discharge instructions. Questions answered to the best of my ability  - Advised follow up with Cardiology  - Comprehensive metabolic panel; Future - Comprehensive metabolic panel  2. Type 2 diabetes mellitus with other specified complication, without long-term current use of insulin (HCC) - Continue with Marcelline Deist - Follow up in 3 months or sooner if needed - Comprehensive metabolic panel;  Future - Comprehensive metabolic panel  3. Essential hypertension - episodes of hypotension but patient is asymptomatic. Continue to monitor  - Comprehensive metabolic panel; Future - Comprehensive metabolic panel  4. Obstructive sleep apnea syndrome - continue with CPAP  5. Atrial fibrillation, unspecified type (HCC) - SR today   6. Hyponatremia  - Comprehensive metabolic panel; Future - Comprehensive metabolic panel  7. Hypokalemia  - Comprehensive metabolic panel; Future - Comprehensive metabolic panel  8. Elevated LFTs  - Comprehensive metabolic panel; Future - Comprehensive metabolic panel   [name], NP

## 2020-10-18 ENCOUNTER — Encounter (HOSPITAL_COMMUNITY): Payer: Medicare Other

## 2020-10-18 ENCOUNTER — Telehealth: Payer: Self-pay | Admitting: Adult Health

## 2020-10-18 NOTE — Telephone Encounter (Signed)
Frankie from [facility] call and stated she need a verbal order for Home physical therapy once a wk for 9 wks. Tomma Lightning stated you can leave a message on this [phone].

## 2020-10-19 ENCOUNTER — Other Ambulatory Visit: Payer: Self-pay | Admitting: Adult Health

## 2020-10-19 DIAGNOSIS — E871 Hypo-osmolality and hyponatremia: Secondary | ICD-10-CM

## 2020-10-19 NOTE — Progress Notes (Signed)
Encounter for CMP d/t hyponatremia

## 2020-10-19 NOTE — Telephone Encounter (Signed)
Okay for verbal 

## 2020-10-19 NOTE — Telephone Encounter (Signed)
Ok for verbal orders  Thanks 

## 2020-10-19 NOTE — Telephone Encounter (Signed)
Verbal given 

## 2020-10-20 ENCOUNTER — Encounter (HOSPITAL_COMMUNITY): Payer: Self-pay

## 2020-10-20 ENCOUNTER — Other Ambulatory Visit: Payer: Self-pay

## 2020-10-20 ENCOUNTER — Ambulatory Visit (HOSPITAL_COMMUNITY)
Admission: RE | Admit: 2020-10-20 | Discharge: 2020-10-20 | Disposition: A | Discharge status: Home / Self Care | Payer: Medicare Other | Source: Ambulatory Visit | Attending: Internal Medicine | Admitting: Internal Medicine

## 2020-10-20 VITALS — BP 118/55 | HR 96 | Ht 68.0 in | Wt 214.4 lb

## 2020-10-20 DIAGNOSIS — I5023 Acute on chronic systolic (congestive) heart failure: Secondary | ICD-10-CM

## 2020-10-20 DIAGNOSIS — I480 Paroxysmal atrial fibrillation: Secondary | ICD-10-CM | POA: Insufficient documentation

## 2020-10-20 DIAGNOSIS — Z8249 Family history of ischemic heart disease and other diseases of the circulatory system: Secondary | ICD-10-CM | POA: Insufficient documentation

## 2020-10-20 DIAGNOSIS — Z79899 Other long term (current) drug therapy: Secondary | ICD-10-CM | POA: Diagnosis not present

## 2020-10-20 DIAGNOSIS — Z95810 Presence of automatic (implantable) cardiac defibrillator: Secondary | ICD-10-CM | POA: Diagnosis not present

## 2020-10-20 DIAGNOSIS — Z7984 Long term (current) use of oral hypoglycemic drugs: Secondary | ICD-10-CM | POA: Diagnosis not present

## 2020-10-20 DIAGNOSIS — I428 Other cardiomyopathies: Secondary | ICD-10-CM | POA: Diagnosis not present

## 2020-10-20 DIAGNOSIS — I2510 Atherosclerotic heart disease of native coronary artery without angina pectoris: Secondary | ICD-10-CM | POA: Diagnosis not present

## 2020-10-20 DIAGNOSIS — Z87891 Personal history of nicotine dependence: Secondary | ICD-10-CM | POA: Insufficient documentation

## 2020-10-20 DIAGNOSIS — I440 Atrioventricular block, first degree: Secondary | ICD-10-CM | POA: Insufficient documentation

## 2020-10-20 DIAGNOSIS — R7989 Other specified abnormal findings of blood chemistry: Secondary | ICD-10-CM | POA: Diagnosis not present

## 2020-10-20 DIAGNOSIS — Z7951 Long term (current) use of inhaled steroids: Secondary | ICD-10-CM | POA: Insufficient documentation

## 2020-10-20 DIAGNOSIS — I5043 Acute on chronic combined systolic (congestive) and diastolic (congestive) heart failure: Secondary | ICD-10-CM

## 2020-10-20 DIAGNOSIS — I110 Hypertensive heart disease with heart failure: Secondary | ICD-10-CM | POA: Diagnosis not present

## 2020-10-20 DIAGNOSIS — E871 Hypo-osmolality and hyponatremia: Secondary | ICD-10-CM | POA: Insufficient documentation

## 2020-10-20 DIAGNOSIS — Z7901 Long term (current) use of anticoagulants: Secondary | ICD-10-CM | POA: Diagnosis not present

## 2020-10-20 DIAGNOSIS — I5022 Chronic systolic (congestive) heart failure: Secondary | ICD-10-CM | POA: Insufficient documentation

## 2020-10-20 DIAGNOSIS — E119 Type 2 diabetes mellitus without complications: Secondary | ICD-10-CM | POA: Diagnosis not present

## 2020-10-20 LAB — CBC
HCT: 48.8 % (ref 39.0–52.0)
Hemoglobin: 16.1 g/dL (ref 13.0–17.0)
MCH: 28.6 pg (ref 26.0–34.0)
MCHC: 33 g/dL (ref 30.0–36.0)
MCV: 86.7 fL (ref 80.0–100.0)
Platelets: 280 10*3/uL (ref 150–400)
RBC: 5.63 MIL/uL (ref 4.22–5.81)
RDW: 15.3 % (ref 11.5–15.5)
WBC: 10.5 10*3/uL (ref 4.0–10.5)
nRBC: 0 % (ref 0.0–0.2)

## 2020-10-20 LAB — COMPREHENSIVE METABOLIC PANEL
ALT: 53 U/L — ABNORMAL HIGH (ref 0–44)
AST: 49 U/L — ABNORMAL HIGH (ref 15–41)
Albumin: 3.6 g/dL (ref 3.5–5.0)
Alkaline Phosphatase: 75 U/L (ref 38–126)
Anion gap: 9 (ref 5–15)
BUN: 24 mg/dL — ABNORMAL HIGH (ref 8–23)
CO2: 27 mmol/L (ref 22–32)
Calcium: 9 mg/dL (ref 8.9–10.3)
Chloride: 96 mmol/L — ABNORMAL LOW (ref 98–111)
Creatinine, Ser: 1.05 mg/dL (ref 0.61–1.24)
GFR, Estimated: >60 mL/min (ref >60)
Glucose, Bld: 153 mg/dL — ABNORMAL HIGH (ref 70–99)
Potassium: 3.6 mmol/L (ref 3.5–5.1)
Sodium: 132 mmol/L — ABNORMAL LOW (ref 135–145)
Total Bilirubin: 0.7 mg/dL (ref 0.3–1.2)
Total Protein: 7.7 g/dL (ref 6.5–8.1)

## 2020-10-20 LAB — DIGOXIN LEVEL: Digoxin Level: 1 ng/mL (ref 0.8–2.0)

## 2020-10-20 MED ORDER — AMIODARONE HCL 200 MG PO TABS: 100.0000 mg | ORAL_TABLET | Freq: Every day | ORAL | 1 refills | Status: DC

## 2020-10-20 NOTE — Progress Notes (Signed)
Patient presented to VAD clinic with friend for hospital d/c follow up.   Patient denies complaints. Lengthy discussion with Dr. Shirlee Latch re: late stage heart failure options.   EKG obtained and reviewed by Dr. Shirlee Latch. Will decrease Amiodarone to 100 mg daily.   Creat elevated on latest labs per Dr. Shirlee Latch. Will stop Losartan.   Pt will follow up with Dr. Shirlee Latch in Heart Failure Clinic in two weeks.   Hessie Diener RN, VAD Coordinator [phone]

## 2020-10-20 NOTE — Progress Notes (Signed)
PCP: Dorothyann Peng, NP Cardiology: Dr Gasper Sells HF Cardiology: Dr. Aundra Dubin  75 y.o. with history of chronic systolic CHF, paroxysmal atrial fibrillation, and type 2 diabetes presents for followup of CHF.  Patient has a long history of nonischemic cardiomyopathy, has [employer] ICD.  Cath in 2/22 showed mild nonobstructive CAD.  Echo in 4/22 showed EF < 20%, severe LV dilation, moderately decreased RV systolic function with mild RV enlargement, severe biatrial enlargement, mild-moderate MR, no more than moderate AS. Recent workup has suggested no more than moderate aortic stenosis, not thought to be severe enough for TAVR.   Patient was admitted in 4/22 with acute on chronic systolic CHF and marked volume overload.  Co-ox was low and milrinone 0.25 was started.  He was extensively diuresed.  He had sustained VT terminated by ICD discharge while in the hospital.  Tikosyn was stopped and amiodarone was started. In the past, he had not tolerated amiodarone but has had no problem with it this time.  I tried to wean him off milrinone, but co-ox dropped to 49% so I restarted it, and he went home on milrinone.  We discussed LVAD while he was in the hospital and workup was completed.  He wanted to go home for some time to think about it.   He returns for followup today.  He remains on milrinone 0.25.  He feels much better on milrinone than he did prior to admission.  No dyspnea walking on flat ground.  Mild dyspnea with stairs.  Able to do ADLs without trouble now and no longer has orthopnea.  No lightheadedness.  Weight is up about 5 lbs.   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 HTN 2. Type 2 diabetes 3. Atrial fibrillation: Paroxysmal.  Initially on dofetilide, now on amiodarone.  4. OSA: uses Bipap 5. VT: In 4/22 while on milrinone, now on amiodarone.  6. Chronic systolic CHF: Nonischemic  cardiomyopathy. Medtronic ICD.  - LHC/RHC (2/22): Mild nonobstructive CAD, CI low at 2.04. Moderate aortic stenosis.  - Echo (4/22): EF < 20%, severe LV dilation, moderately decreased RV systolic function with mild RV enlargement, severe biatrial enlargement, mild-moderate MR, no more than moderate AS.  - RHC (on milrinone): mean RA 10, PA 64/23, mean PCWP 23, CI 2.89, PAPi 4.1.  - Co-ox 49% off milrinone.  7. Aortic stenosis: Moderate by echo and cath.  8. Elevated LFTs  Social History   Socioeconomic History  . Marital status: Widowed    Spouse name: Not on file  . Number of children: Not on file  . Years of education: Not on file  . Highest education level: Not on file  Occupational History  . Not on file  Tobacco Use  . Smoking status: Former Research scientist (life sciences)  . Smokeless tobacco: Never Used  Vaping Use  . Vaping Use: Never used  Substance and Sexual Activity  . Alcohol use: Not Currently  . Drug use: Not Currently  . Sexual activity: Not on file  Other Topics Concern  . Not on file  Social History Narrative  . Not on file   Social Determinants of Health   Financial Resource Strain: Low Risk   . Difficulty of Paying Living Expenses: Not very hard  Food Insecurity: No Food Insecurity  . Worried About Charity fundraiser in the Last Year: Never true  . Ran Out of Food in  the Last Year: Never true  Transportation Needs: No Transportation Needs  . Lack of Transportation (Medical): No  . Lack of Transportation (Non-Medical): No  Physical Activity: Inactive  . Days of Exercise per Week: 0 days  . Minutes of Exercise per Session: 0 min  Stress: No Stress Concern Present  . Feeling of Stress : Not at all  Social Connections: Moderately Isolated  . Frequency of Communication with Friends and Family: Once a week  . Frequency of Social Gatherings with Friends and Family: More than three times a week  . Attends Religious Services: Never  . Active Member of Clubs or Organizations: No   . Attends Archivist Meetings: Never  . Marital Status: Living with partner  Intimate Partner Violence: Not At Risk  . Fear of Current or Ex-Partner: No  . Emotionally Abused: No  . Physically Abused: No  . Sexually Abused: No   Family History  Problem Relation Age of Onset  . Heart attack Mother   . High Cholesterol Mother   . Asthma Father   . Kidney disease Sister   . Stroke Maternal Grandfather   . Heart attack Paternal Grandfather    ROS: All systems reviewed and negative except as per HPI.   Current Outpatient Medications  Medication Sig Dispense Refill  . Arginine (L-ARGININE-500) 500 MG CAPS Take 500 mg by mouth daily.    . Ascorbic Acid (VITAMIN C) 1000 MG tablet Take 1,000 mg by mouth 2 (two) times daily.    Marland Kitchen atorvastatin (LIPITOR) 20 MG tablet Take 20 mg by mouth daily.    . B Complex Vitamins (B COMPLEX PO) Take 1 tablet by mouth daily.    . Biotin 10 MG TABS Take 10 mg by mouth daily. 10 mg - 10,000 mcg    . budesonide-formoterol (SYMBICORT) 160-4.5 MCG/ACT inhaler Inhale 2 puffs into the lungs 2 (two) times daily. 30.6 g 3  . CALCIUM PO Take 1 tablet by mouth 2 (two) times daily.    . cholecalciferol (VITAMIN D3) 25 MCG (1000 UNIT) tablet Take 1,000 Units by mouth 2 (two) times daily.    . CHROMIUM PO Take 1 tablet by mouth 2 (two) times daily.    . dapagliflozin propanediol (FARXIGA) 10 MG TABS tablet Take 1 tablet (10 mg total) by mouth daily before breakfast. 28 tablet 0  . digoxin (LANOXIN) 0.125 MG tablet Take 1 tablet (0.125 mg total) by mouth daily. 30 tablet 1  . ELIQUIS 5 MG TABS tablet Take 5 mg by mouth 2 (two) times daily.    Marland Kitchen escitalopram (LEXAPRO) 10 MG tablet TAKE 1 TABLET(10 MG) BY MOUTH DAILY (Patient taking differently: Take 10 mg by mouth daily.) 90 tablet 0  . levOCARNitine (L-CARNITINE) 500 MG TABS Take 500 mg by mouth daily.    . Magnesium 400 MG TABS Take 400 mg by mouth daily. (Patient taking differently: Take 400 mg by mouth in  the morning and at bedtime.) 60 tablet 3  . metFORMIN (GLUCOPHAGE) 500 MG tablet Take 1-3 tablets (500-1,500 mg total) by mouth See admin instructions. Take one tablet (500 mg) by mouth daily with breakfast and three tablets (1500 mg) daily with supper (Patient taking differently: Take 500-1,500 mg by mouth See admin instructions. (500 mg) with breakfast and  (1500 mg) daily with supper)    . milrinone (PRIMACOR) 20 MG/100 ML SOLN infusion Inject 0.0243 mg/min into the vein continuous.    Glory Rosebush ULTRA test strip Use as instructed to check  blood sugars twice daily 600 each 3  . potassium chloride SA (KLOR-CON) 20 MEQ tablet Take 1 tablet (20 mEq total) by mouth every evening. 30 tablet 1  . potassium chloride SA (KLOR-CON) 20 MEQ tablet Take 2 tablets (40 mEq total) by mouth daily. 30 tablet 1  . spironolactone (ALDACTONE) 25 MG tablet Take 25 mg by mouth daily.    Marland Kitchen torsemide 40 MG TABS Take 80 mg by mouth 2 (two) times daily. 120 tablet 1  . vitamin E 180 MG (400 UNITS) capsule Take 400 Units by mouth daily.    . Zinc 50 MG TABS Take 50 mg by mouth daily.    Marland Kitchen Zn-Pyg Afri-Nettle-Saw Palmet (SAW PALMETTO COMPLEX PO) Take 540 mg by mouth daily.    Marland Kitchen amiodarone (PACERONE) 200 MG tablet Take 0.5 tablets (100 mg total) by mouth daily. 30 tablet 1   No current facility-administered medications for this encounter.   BP (!) 118/55 Comment: MAP 88  Pulse 96   Ht 5' 8"  (1.727 m)   Wt 97.3 kg   SpO2 94%   BMI 32.60 kg/m  General: NAD Neck: JVP 7-8 cm, no thyromegaly or thyroid nodule.  Lungs: Clear to auscultation bilaterally with normal respiratory effort. CV: Nondisplaced PMI.  Heart regular S1/S2, no S3/S4, no murmur.  No peripheral edema.  No carotid bruit.  Normal pedal pulses.  Abdomen: Soft, nontender, no hepatosplenomegaly, no distention.  Skin: Intact without lesions or rashes.  Neurologic: Alert and oriented x 3.  Psych: Normal affect. Extremities: No clubbing or cyanosis.  HEENT:  Normal.   Assessment/Plan: 1. Chronic systolic CHF: Long-standing NICM, followed in the past in [place]. Has MDT ICD. Echo 09/28/20 with EF <20%, severe LV dilation, mild RV dilation with moderate RV dysfunction, mild-moderate MR, moderate-severe TR, severe biatrial enlargement, no more than moderate AS. Last cath in 2/22 showed nonobstructive mild CAD, elevated right and left heart filling pressures with low CI 2.04; AS no more than moderate. Cause of cardiomyopathy uncertain, he is generally in NSR so unlikely to be tachy-mediated CMP. Possible remote viral infection/viral myocarditis. NYHA class IV at 4/22 admission.  He was diuresed on milrinone, then milrinone stopped with drop in co-ox to 49%.  Milrinone restarted at 0.25, co-ox back up to 68% and discharged home on milrinone.  Today, he does not appear significantly volume overloaded.  NYHA class II-III (much improved on milrinone).  Recent rise in BUN/creatinine, but back to baseline on labs today.   - Continue milrinone 0.25, he is milrinone dependent due to low output HF. - Continue torsemide 80 mg bid. - Continue digoxin 0.125 daily, check level today.  - Continue Farxiga and spironolactone 25 mg daily.  - Decrease losartan down to 12.5 mg daily with recent rise in BUN/creatinine.  - He has low output HF. RV function on RHC appeared adequate with PAPi 4.1, creatinine stable.  We have been discussing LVAD and he has been undergoing workup, he has met an LVAD patient and was able to get his questions about the LVAD answered.  He wanted to go home with milrinone to give him a break from the hospital.  We had a long discussion again about the limitations of milrinone and about how LVAD is the only good option at this point to reduce mortality.   He thinks he will likely move towards LVAD eventually but wants time at home.  I will follow him closely in CHF clinic until he is ready for LVAD placement, he  will contact me if he feels like he is  worsening.  2. Atrial fibrillation: Paroxysmal. 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 Off Tikosyn after VF on 4/22.  Did not tolerate amiodarone in the past but doing well on it so far.  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    - Continue Eliquis.  3. Aortic stenosis: Based on full evaluation (cath and echo), suspect no more than moderate aortic stenosis. I do not think he would benefit from TAVR at this point.  4. 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  - Continue statin, do not think this has caused increased LFTs.  5. VF: VF terminated by ICD shock on 4/22.  Milrinone decreased to 0.25.  Tikosyn stopped and amiodarone started.  - Continue amiodarone.  6. Hyponatremia: Hypervolemic hyponatremia initially.  Sodium higher at 131 today.  - Keep fluid restricted.  7. Elevated LFTs:  ?Amiodarone, ?CHF.  Doubt related to statin. Viral hepatitis labs negative. Trending down again today. Abdominal US with possible early cirrhosis, no gallbladder.  - Decrease amiodarone to 100 mg daily.   Loralie Champagne 10/20/2020

## 2020-10-20 NOTE — Patient Instructions (Addendum)
1. Stop Losartan. 2. Decrease Amiodarone to 100 mg daily.  2. Return to Heart Failure Clinic in 2 weeks.

## 2020-10-21 ENCOUNTER — Other Ambulatory Visit: Payer: Self-pay

## 2020-10-22 ENCOUNTER — Telehealth: Payer: Self-pay | Admitting: Adult Health

## 2020-10-22 ENCOUNTER — Ambulatory Visit: Payer: Medicare Other | Admitting: Adult Health

## 2020-10-22 ENCOUNTER — Encounter (HOSPITAL_COMMUNITY): Payer: Self-pay

## 2020-10-22 ENCOUNTER — Other Ambulatory Visit: Payer: Medicare Other

## 2020-10-22 NOTE — Telephone Encounter (Signed)
Okay? Also - they had to go out to draw blood for the heart clinic, so they are also going to draw the CMP he was suppose to come in for today.

## 2020-10-22 NOTE — Telephone Encounter (Signed)
Darl Pikes with [facility] [phone] calling for verbal orders for Home Health OT for 1x week for 3 weeks.  Please advise [phone] can leave a message.

## 2020-10-22 NOTE — Telephone Encounter (Signed)
Verbal left on confidential voicemail.

## 2020-10-22 NOTE — Telephone Encounter (Signed)
That is fine 

## 2020-10-24 ENCOUNTER — Encounter: Payer: Self-pay | Admitting: Physician Assistant

## 2020-10-25 ENCOUNTER — Telehealth: Payer: Self-pay

## 2020-10-25 ENCOUNTER — Encounter: Payer: Self-pay | Admitting: Physician Assistant

## 2020-10-25 ENCOUNTER — Telehealth: Payer: Self-pay | Admitting: Internal Medicine

## 2020-10-25 ENCOUNTER — Encounter (HOSPITAL_COMMUNITY): Payer: Self-pay | Admitting: *Deleted

## 2020-10-25 ENCOUNTER — Encounter (HOSPITAL_COMMUNITY): Payer: Self-pay

## 2020-10-25 ENCOUNTER — Ambulatory Visit (INDEPENDENT_AMBULATORY_CARE_PROVIDER_SITE_OTHER): Payer: Medicare Other

## 2020-10-25 ENCOUNTER — Other Ambulatory Visit (HOSPITAL_COMMUNITY): Payer: Self-pay | Admitting: *Deleted

## 2020-10-25 DIAGNOSIS — I428 Other cardiomyopathies: Secondary | ICD-10-CM

## 2020-10-25 DIAGNOSIS — I5022 Chronic systolic (congestive) heart failure: Secondary | ICD-10-CM

## 2020-10-25 NOTE — Telephone Encounter (Signed)
In response to patient mychart message. Spoke with patient.  He thinks it was yesterday that he received a patient notifier alarm from his ICD.  Advised patient to send a manual transmission so we can check the report and see why this was sent.    Transmission received today shows notifier was  sent for the following reasons:

## 2020-10-25 NOTE — Telephone Encounter (Signed)
Called Chad Wilkerson: voicemail left. - wanted to let both her and Chad Wilkerson know that I had talked with Dr. Shirlee Latch and agree with the LVAD eval.  Wanted to check in with questions.  Will attempt future call.  [name], MD Cardiologist [facility]  [address] [place], [address] [place], [place] [address] [phone]  11:19 AM

## 2020-10-25 NOTE — Progress Notes (Addendum)
Cardiology Office Note    Date:  10/26/2020   ID:  Chad Wilkerson, DOB 1946-06-03, MRN [id number]  PCP:  [name], NP  Cardiologist:  Christell Constant, MD  Electrophysiologist:  Lanier Prude, MD  AHF: Dr. Shirlee Latch  Chief Complaint: f/u CHF amongst other cardiac issues outlined below  History of Present Illness:   Chad Wilkerson is a 75 y.o. male with history of chronic systolic CHF, NICM, home milrinone use, Medtronic ICD, VT, aortic stenosis, mitral regurgitation, tricuspid regurgitation, mildly dilated ascending aorta, first degree AVB, paroxysmal atrial fibrillation, type 2 diabetes, OSA on BiPAP, recent hyponatremia, elevated LFTs who presents for cardiac follow-up.   Per notes, he established care with Dr. Ronette Deter in 03/2020 after having cardiac care elsewhere. He is reported to have a long history of nonischemic cardiomyopathy and has Medtronic ICD. He was on dofetilide when he established care. He also established with Dr. [name] for his EP issues. Studies in our system include cath 07/2020 showing mild nonobstructive CAD and moderate AS with elevated filling pressures and volume overload requiring med adjustment. He was admitted in 09/2020 with acute on chronic systolic CHF and marked volume overload. Co-ox was low and milrinone 0.25 was started. He was extensively diuresed. He had sustained VT terminated by ICD discharge while in the hospital. Tikosyn was stopped and amiodarone was started. In the past, he had not tolerated amiodarone but has had no problem with it this time. The advanced HF team followed him and tried to wean him off milrinone, but co-ox dropped to 49% so he went home on home milrinone. They discussed LVAD while he was in the hospital and workup was completed. He wanted to go home for some time to think about it. 2D echo that admission showed EF < 20%, severe LV dilation, moderately decreased RV systolic function with mild RV enlargement,  severe biatrial enlargement, mild-moderate MR, no more than moderate AS. Recent workup has suggested no more than moderate aortic stenosis, not thought to be severe enough for TAVR. He saw Dr. Shirlee Latch back in follow-up 10/20/20 for continued VAD conversations. The patient did not wish to make a definitive decision at that time so the plan was to continue close follow-up in the AHF clinic until he was ready to proceed with a decision. His amiodarone was reduced due to long 1st degree AVB and elevated LFTs.   He is seen back for follow-up today for general cardiology follow-up with friend Chad Wilkerson. He denies any acute complaints or clinical change. His significant other relayed multiple concerns about care in the hospital. She is eager to know why the VT shock had happened. They have also been disappointed with the disjointed follow-up and frequent changes in regimen. We had a long discussion about his prior history, most recent clinical course and appropriate decisions about his care, involvement of heart failure team and general trajectory. He is very frank that he does not want to feel pressured into making a decision about his LVAD. He states he understands that he is dying and continues to give regular consideration to his end goals. For now he wishes to continue with home milrinone but will be sure to notify the heart failure team of any clinical changes or decisions.  Labwork independently reviewed: 10/20/20 CBC wnl, digoxin level 1.0, K 3.6, Cr 1.05, AST/ALT 49/53 slightly improved from prior, Na 132  09/2020 TSH wnl     Past Medical History:  Diagnosis Date  . Abnormal liver function  tests   . Adjustment disorder   . Aortic stenosis   . Benign colon polyp   . Chronic systolic CHF (congestive heart failure) (HCC)   . Diabetes mellitus without complication (HCC)   . Diverticulitis   . Essential hypertension   . Heart disease   . Hypogonadism male   . Hyponatremia   . ICD (implantable  cardioverter-defibrillator) in place   . Mitral regurgitation   . NICM (nonischemic cardiomyopathy) (HCC)   . PAF (paroxysmal atrial fibrillation) (HCC)   . Sleep apnea   . Tricuspid regurgitation   . Urine incontinence   . Ventricular tachycardia (HCC)   . Vitamin D deficiency     Past Surgical History:  Procedure Laterality Date  . CHOLECYSTECTOMY    . RIGHT HEART CATH N/A 10/05/2020   Procedure: RIGHT HEART CATH;  Surgeon: [name], MD;  Location: [facility] INVASIVE CV LAB;  Service: Cardiovascular;  Laterality: N/A;  . RIGHT/LEFT HEART CATH AND CORONARY ANGIOGRAPHY N/A 07/19/2020   Procedure: RIGHT/LEFT HEART CATH AND CORONARY ANGIOGRAPHY;  Surgeon: Kathleene Hazel, MD;  Location: [facility] INVASIVE CV LAB;  Service: Cardiovascular;  Laterality: N/A;  . TONSILLECTOMY      Current Medications: Current Meds  Medication Sig  . amiodarone (PACERONE) 200 MG tablet Take 0.5 tablets (100 mg total) by mouth daily.  . Arginine (L-ARGININE-500) 500 MG CAPS Take 500 mg by mouth daily.  . Ascorbic Acid (VITAMIN C) 1000 MG tablet Take 1,000 mg by mouth 2 (two) times daily.  Marland Kitchen atorvastatin (LIPITOR) 20 MG tablet Take 20 mg by mouth daily.  . B Complex Vitamins (B COMPLEX PO) Take 1 tablet by mouth daily.  . Biotin 10 MG TABS Take 10 mg by mouth daily. 10 mg - 10,000 mcg  . budesonide-formoterol (SYMBICORT) 160-4.5 MCG/ACT inhaler Inhale 2 puffs into the lungs 2 (two) times daily.  Marland Kitchen CALCIUM PO Take 1 tablet by mouth 2 (two) times daily.  . cholecalciferol (VITAMIN D3) 25 MCG (1000 UNIT) tablet Take 1,000 Units by mouth 2 (two) times daily.  . CHROMIUM PO Take 1 tablet by mouth 2 (two) times daily.  . dapagliflozin propanediol (FARXIGA) 10 MG TABS tablet Take 1 tablet (10 mg total) by mouth daily before breakfast.  . digoxin (LANOXIN) 0.125 MG tablet Take 1 tablet (0.125 mg total) by mouth daily.  Marland Kitchen ELIQUIS 5 MG TABS tablet Take 5 mg by mouth 2 (two) times daily.  Marland Kitchen escitalopram (LEXAPRO) 10  MG tablet TAKE 1 TABLET(10 MG) BY MOUTH DAILY  . levOCARNitine (L-CARNITINE) 500 MG TABS Take 500 mg by mouth daily.  . Magnesium 400 MG TABS Take 400 mg by mouth daily.  . metFORMIN (GLUCOPHAGE) 500 MG tablet Take 1-3 tablets (500-1,500 mg total) by mouth See admin instructions. Take one tablet (500 mg) by mouth daily with breakfast and three tablets (1500 mg) daily with supper  . milrinone (PRIMACOR) 20 MG/100 ML SOLN infusion Inject 0.0243 mg/min into the vein continuous.  Letta Pate ULTRA test strip Use as instructed to check blood sugars twice daily  . potassium chloride SA (KLOR-CON) 20 MEQ tablet Take 1 tablet (20 mEq total) by mouth every evening.  Marland Kitchen spironolactone (ALDACTONE) 25 MG tablet Take 1 tablet (25 mg total) by mouth daily.  Marland Kitchen torsemide (DEMADEX) 20 MG tablet Take 20 mg by mouth daily. Patients takes 5 20 mg tablets by mouth (100  mg total) daily  . vitamin E 180 MG (400 UNITS) capsule Take 400 Units by mouth  daily.  . Zinc 50 MG TABS Take 50 mg by mouth daily.  Marland Kitchen Zn-Pyg Afri-Nettle-Saw Palmet (SAW PALMETTO COMPLEX PO) Take 540 mg by mouth daily.     Allergies:   Semaglutide   Social History   Socioeconomic History  . Marital status: Widowed    Spouse name: Not on file  . Number of children: Not on file  . Years of education: Not on file  . Highest education level: Not on file  Occupational History  . Not on file  Tobacco Use  . Smoking status: Former Games developer  . Smokeless tobacco: Never Used  Vaping Use  . Vaping Use: Never used  Substance and Sexual Activity  . Alcohol use: Not Currently  . Drug use: Not Currently  . Sexual activity: Not on file  Other Topics Concern  . Not on file  Social History Narrative  . Not on file   Social Determinants of Health   Financial Resource Strain: Low Risk   . Difficulty of Paying Living Expenses: Not very hard  Food Insecurity: No Food Insecurity  . Worried About Programme researcher, broadcasting/film/video in the Last Year: Never true  . Ran  Out of Food in the Last Year: Never true  Transportation Needs: No Transportation Needs  . Lack of Transportation (Medical): No  . Lack of Transportation (Non-Medical): No  Physical Activity: Inactive  . Days of Exercise per Week: 0 days  . Minutes of Exercise per Session: 0 min  Stress: No Stress Concern Present  . Feeling of Stress : Not at all  Social Connections: Moderately Isolated  . Frequency of Communication with Friends and Family: Once a week  . Frequency of Social Gatherings with Friends and Family: More than three times a week  . Attends Religious Services: Never  . Active Member of Clubs or Organizations: No  . Attends Banker Meetings: Never  . Marital Status: Living with partner     Family History:  The patient's family history includes Asthma in his father; Heart attack in his mother and paternal grandfather; High Cholesterol in his mother; Kidney disease in his sister; Stroke in his maternal grandfather.  ROS:    Please see the history of present illness.  All other systems are reviewed and otherwise negative.    EKGs/Labs/Other Studies Reviewed:    Studies reviewed are outlined and summarized above. Reports included below if pertinent.  RHC 09/2020  1. Good cardiac output and PAPI on milrinone.  2. Elevated L>R heart filling pressures.  3. Moderate pulmonary venous hypertension.   Will continue milrinone 0.25 for now and restart Lasix gtt, will need further diuresis.  Continue LVAD discussions.   2D echo 09/28/20 1. Left ventricular ejection fraction, by estimation, is <20%. The left  ventricle has severely decreased function. The left ventricle demonstrates  global hypokinesis. The left ventricular internal cavity size was severely  dilated. Left ventricular  diastolic parameters are consistent with Grade III diastolic dysfunction  (restrictive).  2. Right ventricular systolic function is moderately reduced. The right  ventricular size is  mildly enlarged. There is normal pulmonary artery  systolic pressure.  3. Left atrial size was severely dilated.  4. Right atrial size was severely dilated.  5. The mitral valve is grossly normal. Mild to moderate mitral valve  regurgitation.  6. Tricuspid valve regurgitation is moderate to severe.  7. The aortic valve is grossly normal. Aortic valve regurgitation is not  visualized.  8. Aortic dilatation noted. There is mild  dilatation of the ascending  aorta, measuring 41 mm.   Cardiac Cath 07/2020   Prox RCA to Mid RCA lesion is 20% stenosed.  1st Mrg lesion is 40% stenosed.  Mid LAD lesion is 20% stenosed.   1. Mild non-obstructive CAD 2. Elevated filling pressures: RA:13, RV 64/8/14 PA 64/26 (mean 40), PCWP: 28, LV: 101/15/25  AO: 90/60 CO: 4.43 L/min  CI: 2.04  3. Moderate aortic stenosis. He has low gradients across the valve by echo and by cath. Cath: mean gradient 10.5 mmHg, peak to peak gradient 12 mmhg, AVA 1.83 cm2).  The valve was easily crossed with the J wire. Cannot exclude low flow/low gradient AS but the dimensionless index and AVA findings on echo would also argue against this being severe.   Recommendations: He is volume overloaded and dyspneic even with speaking. Will admit for diuresis with IV Lasix. He has severe LV systolic dysfunction with low gradients across the aortic valve. The valve was easily crossed with a J wire. I suspect that he has moderate aortic stenosis. (Echo with AVA over 1.0 cm2, dimensionless index 0.36). Will review his findings with our structural heart team.      EKG:  EKG is not ordered today  Recent Labs: 04/01/2020: NT-Pro BNP 5,001 09/28/2020: B Natriuretic Peptide 3,328.4 10/06/2020: Magnesium 2.1 10/07/2020: TSH 2.953 10/20/2020: ALT 53; BUN 24; Creatinine, Ser 1.05; Hemoglobin 16.1; Platelets 280; Potassium 3.6; Sodium 132  Recent Lipid Panel    Component Value Date/Time   CHOL 87 10/07/2020 0500   TRIG 68 10/07/2020  0500   HDL 31 (L) 10/07/2020 0500   CHOLHDL 2.8 10/07/2020 0500   VLDL 14 10/07/2020 0500   LDLCALC 42 10/07/2020 0500    PHYSICAL EXAM:    VS:  BP (!) 98/58   Pulse 85   Ht 5\' 8"  (1.727 m)   Wt 215 lb 6.4 oz (97.7 kg)   SpO2 93%   BMI 32.75 kg/m   BMI: Body mass index is 32.75 kg/m.  GEN: Well nourished, well developed male in no acute distress HEENT: normocephalic, atraumatic Neck: no JVD, carotid bruits, or masses Cardiac: RRR; no murmurs, rubs, or gallops, no edema  Respiratory:  clear to auscultation bilaterally, normal work of breathing GI: soft, nontender, nondistended, + BS MS: no deformity or atrophy Skin: warm and dry, no rash Neuro:  Alert and Oriented x 3, Strength and sensation are intact, follows commands Psych: euthymic mood, full affect  Wt Readings from Last 3 Encounters:  10/26/20 215 lb 6.4 oz (97.7 kg)  10/20/20 214 lb 6.4 oz (97.3 kg)  10/15/20 211 lb (95.7 kg)     ASSESSMENT & PLAN:   1. Chronic systolic CHF/NICM - volume status appears stable. He is also following very closely with the AHF clinic. Given clinical stability, will make no changes today. He had a BMET planned in the CHF clinic today. To help ease burden of travel, we will obtain in our office today and share results with Dr. Shirlee LatchMcLean when they come in. Long conversation had with patient and SO to explain his recent history and the path of care that has been employed. It has understandably been a very complex emotional journey for them. He will continue to entertain the idea of LVAD as an option. However, he is also very clear that this is a decision he does not wish to entertain lightly and I reassured him that we are there to respect his wishes and treat him as if he were  one of our own family. He will notify us if he makes any changes in his decision.  2. Medtronic ICD and h/o VT - pt had question about alarm - per discussion with device clinic, this was related to lack of transmission being  sent. The patient now has the instructions to send these in. The ICD shock listed from 09/2020 occurred in the hospital and was addressed - no new shocks since that time.  3. Aortic stenosis, also with mild-moderate MR and moderate-severe TR - reassessed by recent echocardiogram. Per Dr. Shirlee Latch,  recent workup has suggested no more than moderate aortic stenosis, not thought to be severe enough for TAVR. Continue to follow clinically.   4. Paroxysmal atrial fibrillation - heart rate regular on exam today. Amiodarone was recently reduced by Dr. Shirlee Latch. Keep f/u as planned. The patient remains on Eliquis at this time. Rhythm also monitored by defib checks.  5. Mildly dilated ascending aorta - can reassess by echocardiogram in 1 year if appropriate at that time. Will defer to primary cardiologist in follow-up.   Disposition: F/u with Dr. Shirlee Latch as scheduled later this month and Dr. Ronette Deter 12/2020.  Medication Adjustments/Labs and Tests Ordered: Current medicines are reviewed at length with the patient today.  Concerns regarding medicines are outlined above. Medication changes, Labs and Tests ordered today are summarized above and listed in the Patient Instructions accessible in Encounters.   Signed, Laurann Montana, PA-C  10/26/2020 2:51 PM    [facility] [address] [place], [place], [place]  [address] Phone: [phone]; Fax: [phone]

## 2020-10-26 ENCOUNTER — Other Ambulatory Visit (HOSPITAL_COMMUNITY): Payer: Medicare Other

## 2020-10-26 ENCOUNTER — Telehealth (HOSPITAL_COMMUNITY): Payer: Self-pay | Admitting: *Deleted

## 2020-10-26 ENCOUNTER — Telehealth: Payer: Self-pay | Admitting: Pharmacist

## 2020-10-26 ENCOUNTER — Encounter: Payer: Self-pay | Admitting: Physician Assistant

## 2020-10-26 ENCOUNTER — Other Ambulatory Visit: Payer: Self-pay

## 2020-10-26 ENCOUNTER — Ambulatory Visit (INDEPENDENT_AMBULATORY_CARE_PROVIDER_SITE_OTHER): Payer: Medicare Other | Admitting: Physician Assistant

## 2020-10-26 VITALS — BP 98/58 | HR 85 | Ht 68.0 in | Wt 215.4 lb

## 2020-10-26 DIAGNOSIS — I472 Ventricular tachycardia: Secondary | ICD-10-CM

## 2020-10-26 DIAGNOSIS — I5022 Chronic systolic (congestive) heart failure: Secondary | ICD-10-CM | POA: Diagnosis not present

## 2020-10-26 DIAGNOSIS — I480 Paroxysmal atrial fibrillation: Secondary | ICD-10-CM

## 2020-10-26 DIAGNOSIS — I428 Other cardiomyopathies: Secondary | ICD-10-CM | POA: Diagnosis not present

## 2020-10-26 DIAGNOSIS — I4729 Other ventricular tachycardia: Secondary | ICD-10-CM

## 2020-10-26 DIAGNOSIS — I350 Nonrheumatic aortic (valve) stenosis: Secondary | ICD-10-CM

## 2020-10-26 MED ORDER — SPIRONOLACTONE 25 MG PO TABS: 25.0000 mg | ORAL_TABLET | Freq: Every day | ORAL | 3 refills | Status: AC

## 2020-10-26 MED ORDER — LOSARTAN POTASSIUM 25 MG PO TABS: 12.5000 mg | ORAL_TABLET | Freq: Every day | ORAL | 3 refills | Status: DC

## 2020-10-26 NOTE — Patient Instructions (Signed)
Medication Instructions:  Your physician recommends that you continue on your current medications as directed. Please refer to the Current Medication list given to you today.  *If you need a refill on your cardiac medications before your next appointment, please call your pharmacy*   Lab Work: TODAY!!  BMET  If you have labs (blood work) drawn today and your tests are completely normal, you will receive your results only by: Marland Kitchen MyChart Message (if you have MyChart) OR . A paper copy in the mail If you have any lab test that is abnormal or we need to change your treatment, we will call you to review the results.   Testing/Procedures: NONE   Follow-Up: At [facility], you and your health needs are our priority.  As part of our continuing mission to provide you with exceptional heart care, we have created designated Provider Care Teams.  These Care Teams include your primary Cardiologist (physician) and Advanced Practice Providers (APPs -  Physician Assistants and Nurse Practitioners) who all work together to provide you with the care you need, when you need it.  We recommend signing up for the patient portal called "MyChart".  Sign up information is provided on this After Visit Summary.  MyChart is used to connect with patients for Virtual Visits (Telemedicine).  Patients are able to view lab/test results, encounter notes, upcoming appointments, etc.  Non-urgent messages can be sent to your provider as well.   To learn more about what you can do with MyChart, go to [email or web address].

## 2020-10-26 NOTE — Telephone Encounter (Signed)
Called patient per Dr. Shirlee Latch and instructed patient to re-start Losartan at 12.5 mg once daily.  Pt verbalized understanding of same.   Hessie Diener RN, VAD Coordinator [phone]

## 2020-10-26 NOTE — Chronic Care Management (AMB) (Signed)
I left the patient a message about his upcoming appointment on 10/27/2020 @ 12:00 pm with the clinical pharmacist. He was asked to please have all medication on hand to review with the pharmacist.  Tresa Garter) Bascom Levels, [facility] Health Concierge [phone]

## 2020-10-27 ENCOUNTER — Telehealth: Payer: Self-pay | Admitting: Adult Health

## 2020-10-27 ENCOUNTER — Ambulatory Visit (INDEPENDENT_AMBULATORY_CARE_PROVIDER_SITE_OTHER): Payer: Medicare Other | Admitting: Pharmacist

## 2020-10-27 DIAGNOSIS — I10 Essential (primary) hypertension: Secondary | ICD-10-CM | POA: Diagnosis not present

## 2020-10-27 DIAGNOSIS — E1169 Type 2 diabetes mellitus with other specified complication: Secondary | ICD-10-CM

## 2020-10-27 DIAGNOSIS — I5043 Acute on chronic combined systolic (congestive) and diastolic (congestive) heart failure: Secondary | ICD-10-CM | POA: Diagnosis not present

## 2020-10-27 LAB — BASIC METABOLIC PANEL
BUN/Creatinine Ratio: 21 (ref 10–24)
BUN: 23 mg/dL (ref 8–27)
CO2: 27 mmol/L (ref 20–29)
Calcium: 9.4 mg/dL (ref 8.6–10.2)
Chloride: 92 mmol/L — ABNORMAL LOW (ref 96–106)
Creatinine, Ser: 1.11 mg/dL (ref 0.76–1.27)
Glucose: 114 mg/dL — ABNORMAL HIGH (ref 65–99)
Potassium: 4 mmol/L (ref 3.5–5.2)
Sodium: 139 mmol/L (ref 134–144)
eGFR: 70 mL/min/{1.73_m2} (ref >59)

## 2020-10-27 NOTE — Progress Notes (Signed)
Chronic Care Management Pharmacy Note  11/09/2020 Name:  Chad Wilkerson MRN:  [id number] DOB:  December 07, 1945  Subjective: Chad Wilkerson is an 75 y.o. year old male who is a primary patient of Nafziger, Tommi Rumps, NP.  The CCM team was consulted for assistance with disease management and care coordination needs.    Engaged with patient face to face for follow up visit in response to provider referral for pharmacy case management and/or care coordination services.   Consent to Services:  The patient was given information about Chronic Care Management services, agreed to services, and gave verbal consent prior to initiation of services.  Please see initial visit note for detailed documentation.   Patient Care Team: Dorothyann Peng, NP as PCP - General (Family Medicine) Werner Lean, MD as PCP - Cardiology (Cardiology) Vickie Epley, MD as PCP - Electrophysiology (Cardiology) Viona Gilmore, [facility] as Pharmacist (Pharmacist)  Recent office visits: 10/15/20 Dorothyann Peng, NP: Patient presented for hospitalization follow up. No medication changes.  06/16/20 Dorothyann Peng, NP: Patient presented with pain of right shoulder.   05/31/20 Ofilia Neas, LPN: Patient presented for medicare annual wellness visit.  Recent consult visits: 10/26/20 Melina Copa, PA-C (cardiology): Patient presented for CHF and Afib follow up. Discussed LVAD procedure and patient will consider but does not want to move forward with it at this time. No medication changes.  10/26/20 Telephone encounter with cardiology: Patient instructed to restart losartan 12.5 mg daily.  09/16/20 Rudean Haskell, MD (cardiology): Patient presented for Afib follow up. No medication changes made.  08/17/20 Lars Mage, MD (cardilology): Patient presented for NICM follow up. No medication changes.  09/16/20 Rudean Haskell, MD (cardiology): Patient presented for Afib follow up. Increased torsemide to 100 mg BID for  SOB.  07/12/20 Rudean Haskell, MD (cardiology): Patient presented for follow up for LFLGAS assessment.  07/05/20 Freda Jackson, MD (pulmonary): Patient presented for OSA consult. No medication changes.  05/10/20 Rudean Haskell, MD (Cardiology): Patient presented for Afib and heart failure. Prescribed Farxiga daily.   Hospital visits: 10/10/20 Patient presented to the ED for IV tubing placement.  4/19-4/30/22 Patient admitted for heart failure. Prescribed potassium, amiodarone and digoxin.   07/19/20 Patient admitted for heart cath.  Objective:  Lab Results  Component Value Date   CREATININE 0.97 11/05/2020   BUN 20 11/05/2020   GFR 60.62 10/15/2020   GFRNONAA >60 11/05/2020   GFRAA 76 08/04/2020   NA 133 (L) 11/05/2020   K 3.4 (L) 11/05/2020   CALCIUM 9.4 11/05/2020   CO2 30 11/05/2020   GLUCOSE 159 (H) 11/05/2020    Lab Results  Component Value Date/Time   HGBA1C 6.6 (H) 09/29/2020 12:40 AM   HGBA1C 6.3 (H) 03/30/2020 02:43 PM   GFR 60.62 10/15/2020 02:59 PM    Last diabetic Eye exam: No results found for: HMDIABEYEEXA  Last diabetic Foot exam: No results found for: HMDIABFOOTEX   Lab Results  Component Value Date   CHOL 87 10/07/2020   HDL 31 (L) 10/07/2020   LDLCALC 42 10/07/2020   TRIG 68 10/07/2020   CHOLHDL 2.8 10/07/2020    Hepatic Function Latest Ref Rng & Units 11/05/2020 10/20/2020 10/15/2020  Total Protein 6.5 - 8.1 g/dL 7.6 7.7 8.6(H)  Albumin 3.5 - 5.0 g/dL 3.5 3.6 4.6  AST 15 - 41 U/L 48(H) 49(H) 61(H)  ALT 0 - 44 U/L 60(H) 53(H) 82(H)  Alk Phosphatase 38 - 126 U/L 90 75 115  Total Bilirubin 0.3 - 1.2 mg/dL 0.7  0.7 1.2  Bilirubin, Direct 0.0 - 0.2 mg/dL - - -    Lab Results  Component Value Date/Time   TSH 3.138 11/05/2020 12:52 PM   TSH 2.953 10/07/2020 03:51 AM   FREET4 1.17 (H) 10/07/2020 05:00 AM    CBC Latest Ref Rng & Units 11/05/2020 10/20/2020 10/09/2020  WBC 4.0 - 10.5 K/uL 6.3 10.5 8.7  Hemoglobin 13.0 - 17.0 g/dL 15.0 16.1  15.5  Hematocrit 39.0 - 52.0 % 43.5 48.8 45.9  Platelets 150 - 400 K/uL 282 280 288    No results found for: VD25OH  Clinical ASCVD: No  The ASCVD Risk score Mikey Bussing DC Jr., et al., 2013) failed to calculate for the following reasons:   The valid total cholesterol range is 130 to 320 mg/dL    Depression screen [facility] 2/9 05/31/2020  Decreased Interest 0  Down, Depressed, Hopeless 0  PHQ - 2 Score 0  Altered sleeping 0  Tired, decreased energy 0  Change in appetite 0  Feeling bad or failure about yourself  0  Trouble concentrating 0  Moving slowly or fidgety/restless 0  Suicidal thoughts 0  PHQ-9 Score 0  Difficult doing work/chores Not difficult at all     CHA2DS2/VAS Stroke Risk Points  Current as of about an hour ago     5 >= 2 Points: High Risk  1 - 1.99 Points: Medium Risk  0 Points: Low Risk    Last Change:       Details    This score determines the patient's risk of having a stroke if the  patient has atrial fibrillation.       Points Metrics  1 Has Congestive Heart Failure:  Yes    Current as of about an hour ago  1 Has Vascular Disease:  Yes    Current as of about an hour ago  1 Has Hypertension:  Yes    Current as of about an hour ago  1 Age:  32    Current as of about an hour ago  1 Has Diabetes:  Yes    Current as of about an hour ago  0 Had Stroke:  No  Had TIA:  No  Had Thromboembolism:  No    Current as of about an hour ago  0 Male:  No    Current as of about an hour ago          No flowsheet data found.   No flowsheet data found.     Social History   Tobacco Use  Smoking Status Former Smoker  Smokeless Tobacco Never Used   BP Readings from Last 3 Encounters:  10/26/20 (!) 98/58  10/20/20 (!) 118/55  10/15/20 110/64   Pulse Readings from Last 3 Encounters:  11/05/20 74  10/26/20 85  10/20/20 96   Wt Readings from Last 3 Encounters:  11/05/20 214 lb 12.8 oz (97.4 kg)  10/26/20 215 lb 6.4 oz (97.7 kg)  10/20/20 214 lb 6.4 oz  (97.3 kg)   BMI Readings from Last 3 Encounters:  11/05/20 32.66 kg/m  10/26/20 32.75 kg/m  10/20/20 32.60 kg/m    Assessment/Interventions: Review of patient past medical history, allergies, medications, health status, including review of consultants reports, laboratory and other test data, was performed as part of comprehensive evaluation and provision of chronic care management services.   SDOH:  (Social Determinants of Health) assessments and interventions performed: No  SDOH Screenings   Alcohol Screen: Low Risk   . Last Alcohol  Screening Score (AUDIT): 0  Depression (PHQ2-9): Low Risk   . PHQ-2 Score: 0  Financial Resource Strain: Low Risk   . Difficulty of Paying Living Expenses: Not very hard  Food Insecurity: No Food Insecurity  . Worried About Charity fundraiser in the Last Year: Never true  . Ran Out of Food in the Last Year: Never true  Housing: Low Risk   . Last Housing Risk Score: 0  Physical Activity: Inactive  . Days of Exercise per Week: 0 days  . Minutes of Exercise per Session: 0 min  Social Connections: Moderately Isolated  . Frequency of Communication with Friends and Family: Once a week  . Frequency of Social Gatherings with Friends and Family: More than three times a week  . Attends Religious Services: Never  . Active Member of Clubs or Organizations: No  . Attends Archivist Meetings: Never  . Marital Status: Living with partner  Stress: No Stress Concern Present  . Feeling of Stress : Not at all  Tobacco Use: Medium Risk  . Smoking Tobacco Use: Former Smoker  . Smokeless Tobacco Use: Never Used  Transportation Needs: No Transportation Needs  . Lack of Transportation (Medical): No  . Lack of Transportation (Non-Medical): No   Patient misunderstood plan from cardiology and was unaware he had to take losartan 1/2 tablet daily as he thought they said atorvastatin over the phone. Clarified directions and patient understood. Plan for BP  assessment in a month.  CCM Care Plan  Allergies  Allergen Reactions  . Semaglutide Other (See Comments)    Nausea ,vomiting ,back pain    Medications Reviewed Today    Reviewed by Stanford Scotland, RN (Registered Nurse) on 11/05/20 at 1158  Med List Status: <None>  Medication Order Taking? Sig Documenting Provider Last Dose Status Informant  amiodarone (PACERONE) 200 MG tablet [id number] Yes Take 0.5 tablets (100 mg total) by mouth daily. Larey Dresser, MD Taking Active   Arginine (L-ARGININE-500) 500 MG CAPS [id number] Yes Take 500 mg by mouth daily. [provider] Taking Active Multiple Informants  Ascorbic Acid (VITAMIN C) 1000 MG tablet [id number] Yes Take 1,000 mg by mouth 2 (two) times daily. [provider] Taking Active Multiple Informants  atorvastatin (LIPITOR) 20 MG tablet [id number] Yes Take 20 mg by mouth daily. [provider] Taking Active Multiple Informants  B Complex Vitamins (B COMPLEX PO) [id number] Yes Take 1 tablet by mouth daily. [provider] Taking Active Multiple Informants  Biotin 10 MG TABS [id number] Yes Take 10 mg by mouth daily. 10 mg - 10,000 mcg [provider] Taking Active Multiple Informants  budesonide-formoterol (SYMBICORT) 160-4.5 MCG/ACT inhaler [id number] Yes Inhale 2 puffs into the lungs 2 (two) times daily. Freddi Starr, MD Taking Active Multiple Informants  CALCIUM PO [id number] Yes Take 1 tablet by mouth 2 (two) times daily. [provider] Taking Active Multiple Informants  cholecalciferol (VITAMIN D3) 25 MCG (1000 UNIT) tablet [id number] Yes Take 1,000 Units by mouth 2 (two) times daily. [provider] Taking Active Multiple Informants  CHROMIUM PO [id number] Yes Take 1 tablet by mouth 2 (two) times daily. [provider] Taking Active Multiple Informants  dapagliflozin propanediol (FARXIGA) 10 MG TABS tablet [id number] Yes Take 1 tablet (10 mg total) by mouth daily  before breakfast. Isaac Bliss, Rayford Halsted, MD Taking Active Multiple Informants  digoxin (LANOXIN) 0.125 MG tablet [id number] Yes Take 1 tablet (0.125 mg total) by mouth daily. Cristal Ford, DO Taking  Active Multiple Informants  ELIQUIS 5 MG TABS tablet [id number] Yes Take 5 mg by mouth 2 (two) times daily. [provider] Taking Active Multiple Informants           Med Note Herbert Spires Oct 10, 2020  9:57 PM)    escitalopram (LEXAPRO) 10 MG tablet [id number] Yes TAKE 1 TABLET(10 MG) BY MOUTH DAILY Nafziger, Tommi Rumps, NP Taking Active   levOCARNitine (L-CARNITINE) 500 MG TABS [id number] Yes Take 500 mg by mouth daily. [provider] Taking Active Multiple Informants  losartan (COZAAR) 25 MG tablet [id number] Yes Take 0.5 tablets (12.5 mg total) by mouth daily. Larey Dresser, MD Taking Active   Magnesium 400 MG TABS [id number] Yes Take 400 mg by mouth daily. Kathyrn Drown D, NP Taking Active   metFORMIN (GLUCOPHAGE) 500 MG tablet [id number] Yes Take 1-3 tablets (500-1,500 mg total) by mouth See admin instructions. Take one tablet (500 mg) by mouth daily with breakfast and three tablets (1500 mg) daily with supper Isaiah Serge, NP Taking Active   milrinone (PRIMACOR) 20 MG/100 ML SOLN infusion [id number] Yes Inject 0.0243 mg/min into the vein continuous. Cristal Ford, DO Taking Active Multiple Informants  ONETOUCH ULTRA test strip [id number] Yes Use as instructed to check blood sugars twice daily Nafziger, Tommi Rumps, NP Taking Active Multiple Informants  potassium chloride SA (KLOR-CON) 20 MEQ tablet [id number] Yes Take 20 mEq by mouth 2 (two) times daily. [provider] Taking Active   spironolactone (ALDACTONE) 25 MG tablet [id number] Yes Take 1 tablet (25 mg total) by mouth daily. Werner Lean, MD Taking Active   torsemide (DEMADEX) 100 MG tablet [id number] Yes Take 100 mg by mouth 2 (two) times daily. [provider] Taking Active   vitamin E 180 MG  (400 UNITS) capsule [id number] Yes Take 400 Units by mouth daily. [provider] Taking Active Multiple Informants  Zinc 50 MG TABS [id number] Yes Take 50 mg by mouth daily. [provider] Taking Active Multiple Informants  Zn-Pyg Afri-Nettle-Saw Palmet (SAW PALMETTO COMPLEX PO) [id number] Yes Take 540 mg by mouth daily. [provider] Taking Active Multiple Informants          Patient Active Problem List   Diagnosis Date Noted  . Acute on chronic combined systolic (congestive) and diastolic (congestive) heart failure ([place]) 09/28/2020  . 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 NICM (nonischemic cardiomyopathy) ([place]) 07/20/2020  . Acute on chronic systolic CHF (congestive heart failure), NYHA class 3 ([place]) 07/19/2020  . Acute on chronic systolic CHF (congestive heart failure), NYHA class 4 ([place])   . Aortic valve stenosis   . Heart failure with reduced ejection fraction ([place]) 04/01/2020  . Paroxysmal atrial fibrillation ([place]) 04/01/2020  . Moderate aortic stenosis 04/01/2020  . Mild mitral regurgitation 04/01/2020  . Morbid obesity ([place]) 04/01/2020  . Sleep apnea   . Diabetes mellitus with coincident hypertension ([place])   . Diverticulitis   . CHF (congestive heart failure) ([place])   . Adjustment disorder     Immunization History  Administered Date(s) Administered  . Influenza Inj Mdck Quad Pf 07/01/2015, 04/10/2019  . Influenza, High Dose Seasonal PF 04/08/2018, 04/09/2019  . Influenza,trivalent, recombinat, inj, PF 02/24/2020  . Influenza-Unspecified 02/11/2020  . PFIZER(Purple Top)SARS-COV-2 Vaccination 08/05/2019, 08/26/2019, 03/26/2020  . Pneumococcal Conjugate-13 05/31/2020  . Pneumococcal Polysaccharide-23 08/04/2015  . Tdap 04/05/2012    Conditions to be addressed/monitored:  Hyperlipidemia, Diabetes, Atrial Fibrillation, Heart Failure and OSA, Adjustment disorder  Care Plan :  CCM Pharmacy Care Plan  Updates made by  Viona Gilmore, [place] since 11/09/2020 12:00 AM    Problem: Problem: Hyperlipidemia, Diabetes, Atrial Fibrillation, Heart Failure and OSA, Adjustment disorder     Long-Range Goal: Patient-Specific Goal   Start Date: 10/27/2020  Expected End Date: 10/27/2021  This Visit's Progress: On track  Priority: High  Note:   Current Barriers:  . Unable to independently monitor therapeutic efficacy . Unable to maintain control of blood pressure  Pharmacist Clinical Goal(s):  Marland Kitchen Patient will achieve adherence to monitoring guidelines and medication adherence to achieve therapeutic efficacy through collaboration with PharmD and provider.   Interventions: . 1:1 collaboration with Dorothyann Peng, NP regarding development and update of comprehensive plan of care as evidenced by provider attestation and co-signature . Inter-disciplinary care team collaboration (see longitudinal plan of care) . Comprehensive medication review performed; medication list updated in electronic medical record  Hyperlipidemia: (LDL goal < 70) -Controlled -Current treatment:  Atorvastatin 20 mg 1 tablet daily -Medications previously tried: fish oil (no longer needed)  -Current dietary patterns: did not discuss -Current exercise habits: limited due to shortness of breath -Educated on Cholesterol goals;  Benefits of statin for ASCVD risk reduction; Importance of limiting foods high in cholesterol; Exercise goal of 150 minutes per week; -Counseled on diet and exercise extensively Recommended to continue current medication  Diabetes (A1c goal <7%) -Controlled -Current medications: . Farxiga 10 mg 1 tablet daily . Metformin 2000 mg daily -Medications previously tried: Glyxambi (switched to separate medications), Rybelsus (side effects), nsulin (d/c'd in hospital), Tradjenta (cost) -Current home glucose readings . fasting glucose: 80s-90s, 110 . post prandial glucose: 116 -Denies hypoglycemic/hyperglycemic  symptoms -Current meal patterns: meal prepping, fresh foods, eating a lot of fresh fruit . breakfast: did not discuss . lunch: did not discuss  . dinner: did not discuss . snacks: did not discuss . drinks: did not discuss -Current exercise: limited due to shortness of breath -Educated on A1c and blood sugar goals; Exercise goal of 150 minutes per week; Benefits of routine self-monitoring of blood sugar; Carbohydrate counting and/or plate method -Counseled to check feet daily and get yearly eye exams -Counseled on diet and exercise extensively Recommended to continue current medication  Atrial Fibrillation (Goal: prevent stroke and major bleeding) -Controlled -CHADSVASC: 3 -Current treatment: . Rythym control: amiodarone 200 mg 1/2 tablet daily   Anticoagulation: Eliquis 5 mg 1 tablet twice daily -Medications previously tried: Dofetilide (switched to amiodarone) -Home BP and HR readings: refer to above  -Counseled on increased risk of stroke due to Afib and benefits of anticoagulation for stroke prevention; importance of adherence to anticoagulant exactly as prescribed; avoidance of NSAIDs due to increased bleeding risk with anticoagulants; -Recommended to continue current medication Assessed patient finances. Faxed PAP for Eliquis to cardiologists office for review.  Heart Failure (Goal: manage symptoms and prevent exacerbations) -Uncontrolled -Last ejection fraction: 20-25%  -HF type: Systolic -NYHA Class: III (marked limitation of activity) -AHA HF Stage: C (Heart disease and symptoms present) -Current treatment:  Carvedilol 6.25 mg 1 tablet BID  Farxiga 10 mg 1 tablet daily  Spironolactone 25 mg 1 tablet daily  Potassium chloride 20 mEq 2 tablets daily  Torsemide 20 mg 5 tablets twice daily  Losartan 25 mg 1/2 tablet daily -Medications previously tried: n/a  -Current home BP/HR readings: refer to above -Current dietary habits: limiting salt intake -Current  exercise habits: limited due to shortness of breath -Educated on Benefits of medications for managing symptoms and prolonging life  Importance of weighing daily; if you gain more than 3 pounds in one day or 5 pounds in one week, call cardiologist -Recommended to continue current medication  Sleep apnea (Goal: control symptoms) -Controlled -Current treatment  . Symbicort 160-4.5 mcg/act inhale 2 puffs twice daily -Medications previously tried: none  -Pulmonary function testing: unknown (patient reported occurred in [place] but unsure of assessment)  -Exacerbations requiring treatment in last 6 months: none -Patient reports consistent use of maintenance inhaler -Frequency of rescue inhaler use: n/a -Counseled on [place] inhaler technique; Benefits of consistent maintenance inhaler use -Recommended to continue current medication  Adjustment disorder (Goal: minimize symptoms) -Controlled -Current treatment: . Escitalopram 10 mg 1 tablet daily -Medications previously tried/failed: unknown -PHQ9: 0 -Educated on Benefits of medication for symptom control -Recommended to continue current medication Patient reports this also helps tinnitus.   Health Maintenance -Vaccine gaps: shingrix -Current therapy:   Vitamin E 400 units 1 capsule daily  Saw Palmetto 540 mg 1 tablet daily  Magnesium 400 mg 1 tablet daily  Zinc daily   Chorium daily   L-caritine 500 mg 1 tablet daily  Calcium daily  Vitamin D3 1000 units daily  Vitamin C 1000 mg 1 tablet daily  L-arginine 500 mg 1 capsule daily  Vitamin B complex daily  Biotin 10,000 units daily -Educated on Herbal supplement research is limited and benefits usually cannot be proven Cost vs benefit of each product must be carefully weighed by individual consumer -Patient is satisfied with current therapy and denies issues -Recommended stopping vitamin E due to studies showing potential increase in hospitalizations for heart  failure.  Patient Goals/Self-Care Activities . Patient will:  - take medications as prescribed check blood pressure weekly, document, and provide at future appointments target a minimum of 150 minutes of moderate intensity exercise weekly  Follow Up Plan: Face to Face appointment with care management team member scheduled for: 6 months      Medication Assistance: Farxiga and Symbicortobtained through [employer] and [place] medication assistance program.  Enrollment ends 06/11/21  Patient's preferred pharmacy is:  [facility], [address] AT [address] [place] [address] Phone: [phone] Fax: [phone]  Uses pill box? Yes Pt endorses 99% compliance  We discussed: Current pharmacy is preferred with insurance plan and patient is satisfied with pharmacy services Patient decided to: Continue current medication management strategy  Care Plan and Follow Up Patient Decision:  Patient agrees to Care Plan and Follow-up.  Plan: Face to Face appointment with care management team member scheduled for: 6 months  Jeni Salles, PharmD [place] Pharmacist [place] [facility] [place] [phone]

## 2020-10-27 NOTE — Telephone Encounter (Signed)
Chad Wilkerson from [facility] called to let [name] know that the patient is declining further PT home care. Corrie Dandy feels like that is fine because he seems to be doing a lot thing on his own now.  [facility] [phone]

## 2020-10-27 NOTE — Patient Instructions (Addendum)
Stop taking the vitamin E as we discussed   Check your blood pressure a couple times a week to make sure it's not running too low  Visit Information  Goals Addressed   None    Patient Care Plan: CCM Pharmacy Care Plan    Problem Identified: Problem: Hyperlipidemia, Diabetes, Atrial Fibrillation, Heart Failure and OSA, Adjustment disorder     Long-Range Goal: Patient-Specific Goal   Start Date: 10/27/2020  Expected End Date: 10/27/2021  This Visit's Progress: On track  Priority: High  Note:   Current Barriers:  . Unable to independently monitor therapeutic efficacy . Unable to maintain control of blood pressure  Pharmacist Clinical Goal(s):  Marland Kitchen Patient will achieve adherence to monitoring guidelines and medication adherence to achieve therapeutic efficacy through collaboration with PharmD and provider.   Interventions: . 1:1 collaboration with [name], NP regarding development and update of comprehensive plan of care as evidenced by provider attestation and co-signature . Inter-disciplinary care team collaboration (see longitudinal plan of care) . Comprehensive medication review performed; medication list updated in electronic medical record  Hyperlipidemia: (LDL goal < 70) -Controlled -Current treatment:  Atorvastatin 20 mg 1 tablet daily -Medications previously tried: fish oil (no longer needed)  -Current dietary patterns: did not discuss -Current exercise habits: limited due to shortness of breath -Educated on Cholesterol goals;  Benefits of statin for ASCVD risk reduction; Importance of limiting foods high in cholesterol; Exercise goal of 150 minutes per week; -Counseled on diet and exercise extensively Recommended to continue current medication  Diabetes (A1c goal <7%) -Controlled -Current medications: . Farxiga 10 mg 1 tablet daily . Metformin 2000 mg daily -Medications previously tried: Glyxambi (switched to separate medications), Rybelsus (side  effects), nsulin (d/c'd in hospital), Tradjenta (cost) -Current home glucose readings . fasting glucose: 80s-90s, 110 . post prandial glucose: 116 -Denies hypoglycemic/hyperglycemic symptoms -Current meal patterns: meal prepping, fresh foods, eating a lot of fresh fruit . breakfast: did not discuss . lunch: did not discuss  . dinner: did not discuss . snacks: did not discuss . drinks: did not discuss -Current exercise: limited due to shortness of breath -Educated on A1c and blood sugar goals; Exercise goal of 150 minutes per week; Benefits of routine self-monitoring of blood sugar; Carbohydrate counting and/or plate method -Counseled to check feet daily and get yearly eye exams -Counseled on diet and exercise extensively Recommended to continue current medication  Atrial Fibrillation (Goal: prevent stroke and major bleeding) -Controlled -CHADSVASC: 3 -Current treatment: . Rythym control: amiodarone 200 mg 1/2 tablet daily   Anticoagulation: Eliquis 5 mg 1 tablet twice daily -Medications previously tried: Dofetilide (switched to amiodarone) -Home BP and HR readings: refer to above  -Counseled on increased risk of stroke due to Afib and benefits of anticoagulation for stroke prevention; importance of adherence to anticoagulant exactly as prescribed; avoidance of NSAIDs due to increased bleeding risk with anticoagulants; -Recommended to continue current medication Assessed patient finances. Faxed PAP for Eliquis to cardiologists office for review.  Heart Failure (Goal: manage symptoms and prevent exacerbations) -Uncontrolled -Last ejection fraction: 20-25%  -HF type: Systolic -NYHA Class: III (marked limitation of activity) -AHA HF Stage: C (Heart disease and symptoms present) -Current treatment:  Carvedilol 6.25 mg 1 tablet BID  Farxiga 10 mg 1 tablet daily  Spironolactone 25 mg 1 tablet daily  Potassium chloride 20 mEq 2 tablets daily  Torsemide 20 mg 5 tablets twice  daily  Losartan 25 mg 1/2 tablet daily -Medications previously tried: n/a  -Current home  BP/HR readings: refer to above -Current dietary habits: limiting salt intake -Current exercise habits: limited due to shortness of breath -Educated on Benefits of medications for managing symptoms and prolonging life Importance of weighing daily; if you gain more than 3 pounds in one day or 5 pounds in one week, call cardiologist -Recommended to continue current medication  Sleep apnea (Goal: control symptoms) -Controlled -Current treatment  . Symbicort 160-4.5 mcg/act inhale 2 puffs twice daily -Medications previously tried: none  -Pulmonary function testing: unknown (patient reported occurred in [place] but unsure of assessment)  -Exacerbations requiring treatment in last 6 months: none -Patient reports consistent use of maintenance inhaler -Frequency of rescue inhaler use: n/a -Counseled on Proper inhaler technique; Benefits of consistent maintenance inhaler use -Recommended to continue current medication  Adjustment disorder (Goal: minimize symptoms) -Controlled -Current treatment: . Escitalopram 10 mg 1 tablet daily -Medications previously tried/failed: unknown -PHQ9: 0 -Educated on Benefits of medication for symptom control -Recommended to continue current medication Patient reports this also helps tinnitus.   Health Maintenance -Vaccine gaps: shingrix -Current therapy:   Vitamin E 400 units 1 capsule daily  Saw Palmetto 540 mg 1 tablet daily  Magnesium 400 mg 1 tablet daily  Zinc daily   Chorium daily   L-caritine 500 mg 1 tablet daily  Calcium daily  Vitamin D3 1000 units daily  Vitamin C 1000 mg 1 tablet daily  L-arginine 500 mg 1 capsule daily  Vitamin B complex daily  Biotin 10,000 units daily -Educated on Herbal supplement research is limited and benefits usually cannot be proven Cost vs benefit of each product must be carefully weighed by individual  consumer -Patient is satisfied with current therapy and denies issues -Recommended stopping vitamin E due to studies showing potential increase in hospitalizations for heart failure.  Patient Goals/Self-Care Activities . Patient will:  - take medications as prescribed check blood pressure weekly, document, and provide at future appointments target a minimum of 150 minutes of moderate intensity exercise weekly  Follow Up Plan: Face to Face appointment with care management team member scheduled for: 6 months       Patient verbalizes understanding of instructions provided today and agrees to view in MyChart.  Telephone follow up appointment with pharmacy team member scheduled for: 6 months  Verner Chol, [facility]

## 2020-10-27 NOTE — Telephone Encounter (Signed)
fyi

## 2020-10-28 LAB — CUP PACEART REMOTE DEVICE CHECK
Battery Remaining Longevity: 86 mo
Battery Voltage: 3.01 V
Brady Statistic RV Percent Paced: 0.02 %
Date Time Interrogation Session: 20220516141501
HighPow Impedance: 87 Ohm
Implantable Lead Implant Date: 20180625
Implantable Lead Location: 753860
Implantable Pulse Generator Implant Date: 20180625
Lead Channel Impedance Value: 304 Ohm
Lead Channel Impedance Value: 361 Ohm
Lead Channel Pacing Threshold Amplitude: 0.625 V
Lead Channel Pacing Threshold Pulse Width: 0.4 ms
Lead Channel Sensing Intrinsic Amplitude: 17.375 mV
Lead Channel Sensing Intrinsic Amplitude: 17.375 mV
Lead Channel Setting Pacing Amplitude: 2 V
Lead Channel Setting Pacing Pulse Width: 0.4 ms
Lead Channel Setting Sensing Sensitivity: 0.3 mV

## 2020-10-29 ENCOUNTER — Telehealth: Payer: Self-pay

## 2020-10-29 ENCOUNTER — Telehealth: Payer: Self-pay | Admitting: Internal Medicine

## 2020-10-29 ENCOUNTER — Encounter (HOSPITAL_COMMUNITY): Payer: Self-pay

## 2020-10-29 NOTE — Telephone Encounter (Signed)
**Note De-Identified Aubrynn Katona Obfuscation** A completed BMSPAF application with documents left at the office. I have completed the MD page and emailed all to Dr Geannie Risen nurse so she can obtain his signature, date it and to fax all to [facility] at the number written on the cover letter included or to place in the nurses box in Medical Records to be faxed.

## 2020-10-29 NOTE — Telephone Encounter (Signed)
Pt c/o medication issue:  1. Name of Medication: potassium chloride SA (KLOR-CON) 20 MEQ tablet  2. How are you currently taking this medication (dosage and times per day)? Need to verify   3. Are you having a reaction (difficulty breathing--STAT)? no  4. What is your medication issue? [name] from [place] [facility] calling to verify how the patient is supposed to be taking his potassium. Phone: [phone]

## 2020-10-29 NOTE — Telephone Encounter (Signed)
Per chart pt is on KCL 20 meq daily, attempted to return call and Left message to call back

## 2020-11-01 ENCOUNTER — Telehealth (HOSPITAL_COMMUNITY): Payer: Self-pay | Admitting: *Deleted

## 2020-11-01 DIAGNOSIS — I5022 Chronic systolic (congestive) heart failure: Secondary | ICD-10-CM

## 2020-11-01 NOTE — Telephone Encounter (Signed)
Received call from Jeri Modena, RN infusion coordinator with [facility], she states pt's PICC line has been pulled out some and line is bent in half, she states it is still infusing at the moment but will need to be replaced asap. Advised I would arrange with IR and get back to her  Left detailed VM with IR and am awaiting call back from them.

## 2020-11-01 NOTE — Telephone Encounter (Signed)
After multiple calls and VM finally spoke w/Ashley in radiology, she states there are no openings for picc placement until Wed afternoon. She advised I call her first thing in the AM and she will see if pt can be worked in. Jeri Modena is aware, she states med is still infusing and she has advised pt if it stops to go to ER.

## 2020-11-02 NOTE — Telephone Encounter (Signed)
PICC replacement sch for 5/25 at 11 am, Pam chandler and pt aware

## 2020-11-02 NOTE — Telephone Encounter (Signed)
Document signed and faxed as requested.  Received fax confirmation.

## 2020-11-02 NOTE — Telephone Encounter (Signed)
Pt left vm about picc line needing to be replaced I spoke with Meredith Staggers, RN stating she had been calling IR all morning to work pt in and had not received a call back. I attempted to reach IR no answer at [facility] long I spoke with Morrie Sheldon she said she could not reach anyone either but would call back at [phone] once she speaks to someone. Dinah Beers aware

## 2020-11-03 ENCOUNTER — Ambulatory Visit (HOSPITAL_COMMUNITY)
Admission: RE | Admit: 2020-11-03 | Discharge: 2020-11-03 | Disposition: A | Discharge status: Home / Self Care | Payer: Medicare Other | Source: Ambulatory Visit | Attending: Cardiology | Admitting: Cardiology

## 2020-11-03 ENCOUNTER — Other Ambulatory Visit: Payer: Self-pay

## 2020-11-03 DIAGNOSIS — Z452 Encounter for adjustment and management of vascular access device: Secondary | ICD-10-CM | POA: Diagnosis not present

## 2020-11-03 DIAGNOSIS — I5022 Chronic systolic (congestive) heart failure: Secondary | ICD-10-CM | POA: Diagnosis not present

## 2020-11-03 MED ORDER — LIDOCAINE HCL (PF) 1 % IJ SOLN
INTRAMUSCULAR | Status: AC
  Filled 2020-11-03: qty 30

## 2020-11-05 ENCOUNTER — Ambulatory Visit (HOSPITAL_COMMUNITY)
Admission: RE | Admit: 2020-11-05 | Discharge: 2020-11-05 | Disposition: A | Discharge status: Home / Self Care | Payer: Medicare Other | Source: Ambulatory Visit | Attending: Cardiology | Admitting: Cardiology

## 2020-11-05 ENCOUNTER — Encounter (HOSPITAL_COMMUNITY): Payer: Self-pay | Admitting: Cardiology

## 2020-11-05 ENCOUNTER — Other Ambulatory Visit: Payer: Self-pay

## 2020-11-05 VITALS — HR 74 | Wt 214.8 lb

## 2020-11-05 DIAGNOSIS — I428 Other cardiomyopathies: Secondary | ICD-10-CM

## 2020-11-05 DIAGNOSIS — I5022 Chronic systolic (congestive) heart failure: Secondary | ICD-10-CM

## 2020-11-05 DIAGNOSIS — Z8249 Family history of ischemic heart disease and other diseases of the circulatory system: Secondary | ICD-10-CM | POA: Insufficient documentation

## 2020-11-05 DIAGNOSIS — I110 Hypertensive heart disease with heart failure: Secondary | ICD-10-CM | POA: Insufficient documentation

## 2020-11-05 DIAGNOSIS — I080 Rheumatic disorders of both mitral and aortic valves: Secondary | ICD-10-CM | POA: Insufficient documentation

## 2020-11-05 DIAGNOSIS — I5023 Acute on chronic systolic (congestive) heart failure: Secondary | ICD-10-CM

## 2020-11-05 DIAGNOSIS — I440 Atrioventricular block, first degree: Secondary | ICD-10-CM | POA: Insufficient documentation

## 2020-11-05 DIAGNOSIS — Z87891 Personal history of nicotine dependence: Secondary | ICD-10-CM | POA: Insufficient documentation

## 2020-11-05 DIAGNOSIS — I480 Paroxysmal atrial fibrillation: Secondary | ICD-10-CM

## 2020-11-05 DIAGNOSIS — Z79899 Other long term (current) drug therapy: Secondary | ICD-10-CM | POA: Diagnosis not present

## 2020-11-05 DIAGNOSIS — Z95810 Presence of automatic (implantable) cardiac defibrillator: Secondary | ICD-10-CM | POA: Diagnosis not present

## 2020-11-05 DIAGNOSIS — I2510 Atherosclerotic heart disease of native coronary artery without angina pectoris: Secondary | ICD-10-CM | POA: Diagnosis not present

## 2020-11-05 DIAGNOSIS — Z7984 Long term (current) use of oral hypoglycemic drugs: Secondary | ICD-10-CM | POA: Insufficient documentation

## 2020-11-05 DIAGNOSIS — I517 Cardiomegaly: Secondary | ICD-10-CM | POA: Diagnosis not present

## 2020-11-05 DIAGNOSIS — Z7901 Long term (current) use of anticoagulants: Secondary | ICD-10-CM | POA: Diagnosis not present

## 2020-11-05 DIAGNOSIS — Z7951 Long term (current) use of inhaled steroids: Secondary | ICD-10-CM | POA: Diagnosis not present

## 2020-11-05 DIAGNOSIS — I472 Ventricular tachycardia: Secondary | ICD-10-CM

## 2020-11-05 DIAGNOSIS — E871 Hypo-osmolality and hyponatremia: Secondary | ICD-10-CM | POA: Diagnosis not present

## 2020-11-05 DIAGNOSIS — I4729 Other ventricular tachycardia: Secondary | ICD-10-CM

## 2020-11-05 LAB — COMPREHENSIVE METABOLIC PANEL
ALT: 60 U/L — ABNORMAL HIGH (ref 0–44)
AST: 48 U/L — ABNORMAL HIGH (ref 15–41)
Albumin: 3.5 g/dL (ref 3.5–5.0)
Alkaline Phosphatase: 90 U/L (ref 38–126)
Anion gap: 10 (ref 5–15)
BUN: 20 mg/dL (ref 8–23)
CO2: 30 mmol/L (ref 22–32)
Calcium: 9.4 mg/dL (ref 8.9–10.3)
Chloride: 93 mmol/L — ABNORMAL LOW (ref 98–111)
Creatinine, Ser: 0.97 mg/dL (ref 0.61–1.24)
GFR, Estimated: >60 mL/min (ref >60)
Glucose, Bld: 159 mg/dL — ABNORMAL HIGH (ref 70–99)
Potassium: 3.4 mmol/L — ABNORMAL LOW (ref 3.5–5.1)
Sodium: 133 mmol/L — ABNORMAL LOW (ref 135–145)
Total Bilirubin: 0.7 mg/dL (ref 0.3–1.2)
Total Protein: 7.6 g/dL (ref 6.5–8.1)

## 2020-11-05 LAB — CBC
HCT: 43.5 % (ref 39.0–52.0)
Hemoglobin: 15 g/dL (ref 13.0–17.0)
MCH: 29.2 pg (ref 26.0–34.0)
MCHC: 34.5 g/dL (ref 30.0–36.0)
MCV: 84.8 fL (ref 80.0–100.0)
Platelets: 282 10*3/uL (ref 150–400)
RBC: 5.13 MIL/uL (ref 4.22–5.81)
RDW: 16 % — ABNORMAL HIGH (ref 11.5–15.5)
WBC: 6.3 10*3/uL (ref 4.0–10.5)
nRBC: 0 % (ref 0.0–0.2)

## 2020-11-05 LAB — DIGOXIN LEVEL: Digoxin Level: 0.9 ng/mL (ref 0.8–2.0)

## 2020-11-05 LAB — TSH: TSH: 3.138 u[IU]/mL (ref 0.350–4.500)

## 2020-11-05 LAB — MAGNESIUM: Magnesium: 2.3 mg/dL (ref 1.7–2.4)

## 2020-11-05 MED ORDER — AMIODARONE HCL 200 MG PO TABS: ORAL_TABLET | ORAL | 1 refills | Status: DC

## 2020-11-05 NOTE — Patient Instructions (Signed)
EKG done today.  Labs done today. We will contact you only if your labs are abnormal.  INCREASE Amiodarone to 200mg  (1 tablet) Take 1 tablet by mouth 2 times daily for 5 days THEN DECREASE to 200mg  (1 tablet)  1 tablet by mouth daily.  No other medication changes were made. Please continue all current medications as prescribed.  Your physician recommends that you schedule a follow-up appointment in: 3 weeks with Dr.   If you have any questions or concerns before your next appointment please send a message through [facility] [address] or call our office at [phone].    TO LEAVE A MESSAGE FOR THE NURSE SELECT OPTION 2, PLEASE LEAVE A MESSAGE INCLUDING: . YOUR NAME . DATE OF BIRTH . CALL BACK NUMBER . REASON FOR CALL**this is important as we prioritize the call backs  YOU WILL RECEIVE A CALL BACK THE SAME DAY AS LONG AS YOU CALL BEFORE 4:00 PM   Do the following things EVERYDAY: 1) Weigh yourself in the morning before breakfast. Write it down and keep it in a log. 2) Take your medicines as prescribed 3) Eat low salt foods--Limit salt (sodium) to 2000 mg per day.  4) Stay as active as you can everyday 5) Limit all fluids for the day to less than 2 liters   At the [facility], you and your health needs are our priority. As part of our continuing mission to provide you with exceptional heart care, we have created designated Provider Care Teams. These Care Teams include your primary Cardiologist (physician) and Advanced Practice Providers (APPs- Physician Assistants and Nurse Practitioners) who all work together to provide you with the care you need, when you need it.   You may see any of the following providers on your designated Care Team at your next follow up: [address] [phone] . Dr Marland Kitchen . Arvilla Meres, NP . [name], PA . Tonye Becket, PharmD   Please be sure to bring in all your medications bottles to every appointment.

## 2020-11-07 NOTE — Progress Notes (Signed)
PCP: Dorothyann Peng, NP Cardiology: Dr Gasper Sells HF Cardiology: Dr. Aundra Dubin  75 y.o. with history of chronic systolic CHF, paroxysmal atrial fibrillation, and type 2 diabetes presents for followup of CHF.  Patient has a long history of nonischemic cardiomyopathy, has Medtronic ICD.  Cath in 2/22 showed mild nonobstructive CAD.  Echo in 4/22 showed EF < 20%, severe LV dilation, moderately decreased RV systolic function with mild RV enlargement, severe biatrial enlargement, mild-moderate MR, no more than moderate AS. Recent workup has suggested no more than moderate aortic stenosis, not thought to be severe enough for TAVR.   Patient was admitted in 4/22 with acute on chronic systolic CHF and marked volume overload.  Co-ox was low and milrinone 0.25 was started.  He was extensively diuresed.  He had sustained VT terminated by ICD discharge while in the hospital.  Tikosyn was stopped and amiodarone was started. In the past, he had not tolerated amiodarone but has had no problem with it this time.  I tried to wean him off milrinone, but co-ox dropped to 49% so I restarted it, and he went home on milrinone.  We discussed LVAD while he was in the hospital and workup was completed.  He wanted to go home for some time to think about it.   He returns for followup today.  He remains on milrinone 0.25.  Earlier this week, patient had an episode of warmth/lightheadedness, thinks he could have briefly passed.  He did not feel a device shock, but device interrogation shows VT with appropriate shock x 1.  Weight is stable. No dyspnea walking on flat ground, overall his breathing is stable and he feels pretty good.  No chest pain.  No orthopnea/PND.    Labs (5/22): K 3.6, Na 132, creatinine 1.05 => 1.11, AST 49, ALT 53  ECG: NSR, 1st degree AVB, IVCD 142 msec, QTc normal  PMH:  1. HTN 2. Type 2 diabetes 3. Atrial fibrillation: Paroxysmal.  Initially on dofetilide, now on amiodarone.  4. OSA: uses Bipap 5. VT:  In 4/22 while on milrinone, now on amiodarone.  Recurrent episode in 5/22.  6. Chronic systolic CHF: Nonischemic cardiomyopathy. Medtronic ICD.  - LHC/RHC (2/22): Mild nonobstructive CAD, CI low at 2.04. Moderate aortic stenosis.  - Echo (4/22): EF < 20%, severe LV dilation, moderately decreased RV systolic function with mild RV enlargement, severe biatrial enlargement, mild-moderate MR, no more than moderate AS.  - RHC (on milrinone): mean RA 10, PA 64/23, mean PCWP 23, CI 2.89, PAPi 4.1.  - Co-ox 49% off milrinone.  7. Aortic stenosis: Moderate by echo and cath.  8. Elevated LFTs  Social History   Socioeconomic History  . Marital status: Widowed    Spouse name: Not on file  . Number of children: Not on file  . Years of education: Not on file  . Highest education level: Not on file  Occupational History  . Not on file  Tobacco Use  . Smoking status: Former Research scientist (life sciences)  . Smokeless tobacco: Never Used  Vaping Use  . Vaping Use: Never used  Substance and Sexual Activity  . Alcohol use: Not Currently  . Drug use: Not Currently  . Sexual activity: Not on file  Other Topics Concern  . Not on file  Social History Narrative  . Not on file   Social Determinants of Health   Financial Resource Strain: Low Risk   . Difficulty of Paying Living Expenses: Not very hard  Food Insecurity: No Food Insecurity  .  Worried About Charity fundraiser in the Last Year: Never true  . Ran Out of Food in the Last Year: Never true  Transportation Needs: No Transportation Needs  . Lack of Transportation (Medical): No  . Lack of Transportation (Non-Medical): No  Physical Activity: Inactive  . Days of Exercise per Week: 0 days  . Minutes of Exercise per Session: 0 min  Stress: No Stress Concern Present  . Feeling of Stress : Not at all  Social Connections: Moderately Isolated  . Frequency of Communication with Friends and Family: Once a week  . Frequency of Social Gatherings with Friends and Family:  More than three times a week  . Attends Religious Services: Never  . Active Member of Clubs or Organizations: No  . Attends Archivist Meetings: Never  . Marital Status: Living with partner  Intimate Partner Violence: Not At Risk  . Fear of Current or Ex-Partner: No  . Emotionally Abused: No  . Physically Abused: No  . Sexually Abused: No   Family History  Problem Relation Age of Onset  . Heart attack Mother   . High Cholesterol Mother   . Asthma Father   . Kidney disease Sister   . Stroke Maternal Grandfather   . Heart attack Paternal Grandfather    ROS: All systems reviewed and negative except as per HPI.   Current Outpatient Medications  Medication Sig Dispense Refill  . Arginine (L-ARGININE-500) 500 MG CAPS Take 500 mg by mouth daily.    . Ascorbic Acid (VITAMIN C) 1000 MG tablet Take 1,000 mg by mouth 2 (two) times daily.    Marland Kitchen atorvastatin (LIPITOR) 20 MG tablet Take 20 mg by mouth daily.    . B Complex Vitamins (B COMPLEX PO) Take 1 tablet by mouth daily.    . Biotin 10 MG TABS Take 10 mg by mouth daily. 10 mg - 10,000 mcg    . budesonide-formoterol (SYMBICORT) 160-4.5 MCG/ACT inhaler Inhale 2 puffs into the lungs 2 (two) times daily. 30.6 g 3  . CALCIUM PO Take 1 tablet by mouth 2 (two) times daily.    . cholecalciferol (VITAMIN D3) 25 MCG (1000 UNIT) tablet Take 1,000 Units by mouth 2 (two) times daily.    . CHROMIUM PO Take 1 tablet by mouth 2 (two) times daily.    . dapagliflozin propanediol (FARXIGA) 10 MG TABS tablet Take 1 tablet (10 mg total) by mouth daily before breakfast. 28 tablet 0  . digoxin (LANOXIN) 0.125 MG tablet Take 1 tablet (0.125 mg total) by mouth daily. 30 tablet 1  . ELIQUIS 5 MG TABS tablet Take 5 mg by mouth 2 (two) times daily.    Marland Kitchen escitalopram (LEXAPRO) 10 MG tablet TAKE 1 TABLET(10 MG) BY MOUTH DAILY 90 tablet 0  . levOCARNitine (L-CARNITINE) 500 MG TABS Take 500 mg by mouth daily.    Marland Kitchen losartan (COZAAR) 25 MG tablet Take 0.5  tablets (12.5 mg total) by mouth daily. 90 tablet 3  . Magnesium 400 MG TABS Take 400 mg by mouth daily. 60 tablet 3  . metFORMIN (GLUCOPHAGE) 500 MG tablet Take 1-3 tablets (500-1,500 mg total) by mouth See admin instructions. Take one tablet (500 mg) by mouth daily with breakfast and three tablets (1500 mg) daily with supper    . milrinone (PRIMACOR) 20 MG/100 ML SOLN infusion Inject 0.0243 mg/min into the vein continuous.    Glory Rosebush ULTRA test strip Use as instructed to check blood sugars twice daily 600 each 3  .  potassium chloride SA (KLOR-CON) 20 MEQ tablet Take 20 mEq by mouth 2 (two) times daily.    Marland Kitchen spironolactone (ALDACTONE) 25 MG tablet Take 1 tablet (25 mg total) by mouth daily. 90 tablet 3  . torsemide (DEMADEX) 100 MG tablet Take 100 mg by mouth 2 (two) times daily.    . vitamin E 180 MG (400 UNITS) capsule Take 400 Units by mouth daily.    . Zinc 50 MG TABS Take 50 mg by mouth daily.    Marland Kitchen Zn-Pyg Afri-Nettle-Saw Palmet (SAW PALMETTO COMPLEX PO) Take 540 mg by mouth daily.    Marland Kitchen amiodarone (PACERONE) 200 MG tablet Take 1 tablet by mouth 2 times daily for 5 days then decrease to 1 tablet by mouth daily. 45 tablet 1   No current facility-administered medications for this encounter.   Pulse 74   Wt 97.4 kg (214 lb 12.8 oz)   SpO2 96%   BMI 32.66 kg/m  General: NAD Neck: No JVD, no thyromegaly or thyroid nodule.  Lungs: Clear to auscultation bilaterally with normal respiratory effort. CV: Nondisplaced PMI.  Heart regular S1/S2, no S3/S4, 2/6 SEM RUSB with clear S2.  No peripheral edema.  No carotid bruit.  Normal pedal pulses.  Abdomen: Soft, nontender, no hepatosplenomegaly, no distention.  Skin: Intact without lesions or rashes.  Neurologic: Alert and oriented x 3.  Psych: Normal affect. Extremities: No clubbing or cyanosis.  HEENT: Normal.   Assessment/Plan: 1. Chronic systolic CHF: Long-standing NICM, followed in the past in [place]. Has MDT ICD. Echo 09/28/20 with  EF <20%, severe LV dilation, mild RV dilation with moderate RV dysfunction, mild-moderate MR, moderate-severe TR, severe biatrial enlargement, no more than moderate AS. Last cath in 2/22 showed nonobstructive mild CAD, elevated right and left heart filling pressures with low CI 2.04; AS no more than moderate. Cause of cardiomyopathy uncertain, he is generally in NSR so unlikely to be tachy-mediated CMP. Possible remote viral infection/viral myocarditis. NYHA class IV at 4/22 admission.  He was diuresed on milrinone, then milrinone stopped with drop in co-ox to 49%.  Milrinone restarted at 0.25, co-ox back up to 68% and discharged home on milrinone.  Today, he does not appear significantly volume overloaded.  NYHA class II-III (much improved on milrinone).  Creatinine stable recently at 1.11.    - Continue milrinone 0.25, he is milrinone dependent due to low output HF.  Continuing milrinone long-term is not a good option for him given recurrent VT/VF with ICD shocks.  - Continue torsemide 100 mg bid. BMET today.  - Continue digoxin 0.125 daily, check level today.  - Continue Farxiga and spironolactone 25 mg daily.  - Continue losartan 12.5 daily.  - He has low output HF. RV function on RHC appeared adequate with PAPi 4.1, creatinine stable.  We have been discussing LVAD and he has been undergoing workup, he has met an LVAD patient and was able to get his questions about the LVAD answered.  He wanted to go home with milrinone to give him a break from the hospital.  We have had a long discussion again about the limitations of milrinone and about how LVAD is the only good option at this point to reduce mortality, I reiterated this today.   He thinks he will likely move towards LVAD eventually but wants time at home.  I will follow him closely in CHF clinic until he is ready for LVAD placement. I encouraged him to make a decision soon, given another episode of VT  requiring shock and the likelihood that these  episodes will continue while on milrinone.  2. Atrial fibrillation: Paroxysmal. 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 Off Tikosyn after VF on 4/22.  Did not tolerate amiodarone in the past but doing well on it so far.  - Continue amiodarone.   - Continue Eliquis.  3. Aortic stenosis: Based on full evaluation (cath and echo), suspect no more than moderate aortic stenosis. I do not think he would benefit from TAVR at this point.  4. 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  - Continue statin, do not think this has caused increased LFTs.  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  Milrinone decreased to 0.25.  Tikosyn stopped and amiodarone started. He had recurrent VT earlier this week.  Risk will be high as long as he is on milrinone.  - Check BMET, Mg.  - Increase amiodarone to 200 mg bid x 5 days, then 200 mg daily after that.  Will need to watch ECG (has 1st degree AVB).  6. Hyponatremia: History of hypervolemic hyponatremia.  - Keep fluid restricted.  7. Elevated LFTs:  ?Amiodarone, ?CHF.  Doubt related to statin. Viral hepatitis labs negative.  Abdominal US with possible early cirrhosis, no gallbladder.  - Check LFTs today and follow closely with increase in amiodarone.   Followup in 3 wks with me.  Again encouraged him to strongly consider LVAD sooner rather than later, especially with recurrent VT on milrinone.   Loralie Champagne 11/07/2020

## 2020-11-09 ENCOUNTER — Encounter (HOSPITAL_COMMUNITY): Payer: Self-pay

## 2020-11-10 ENCOUNTER — Telehealth (HOSPITAL_COMMUNITY): Payer: Self-pay

## 2020-11-10 DIAGNOSIS — I5022 Chronic systolic (congestive) heart failure: Secondary | ICD-10-CM

## 2020-11-10 MED ORDER — POTASSIUM CHLORIDE CRYS ER 20 MEQ PO TBCR: 40.0000 meq | EXTENDED_RELEASE_TABLET | Freq: Two times a day (BID) | ORAL | 6 refills | Status: DC

## 2020-11-10 NOTE — Telephone Encounter (Signed)
-----   Message from [name], MD sent at 11/07/2020 11:05 PM EDT ----- He should be taking KCl 20 bid.  Increase to 40 bid.  BMET 1 week.

## 2020-11-10 NOTE — Telephone Encounter (Signed)
Pt aware of results. Will increase potassium to 2 tabs bid. Will repeat labs in 1 week. Verbalized understanding.

## 2020-11-11 ENCOUNTER — Encounter (HOSPITAL_COMMUNITY): Payer: Self-pay

## 2020-11-11 ENCOUNTER — Other Ambulatory Visit (HOSPITAL_COMMUNITY): Payer: Self-pay | Admitting: *Deleted

## 2020-11-11 MED ORDER — ATORVASTATIN CALCIUM 20 MG PO TABS: 20.0000 mg | ORAL_TABLET | Freq: Every day | ORAL | 3 refills | Status: DC

## 2020-11-12 ENCOUNTER — Encounter (HOSPITAL_COMMUNITY): Payer: Self-pay

## 2020-11-12 ENCOUNTER — Telehealth: Payer: Self-pay | Admitting: Pharmacist

## 2020-11-13 NOTE — Chronic Care Management (AMB) (Signed)
Chronic Care Management Pharmacy Assistant   Name: Chad Wilkerson  MRN: [id number] DOB: 01-25-1946   Reason for Encounter: Disease State/Patient Assistance Documentation   Conditions to be addressed/monitored: HTN  Recent office visits:  None  Recent consult visits:  06.01.2022 Telephone call medication-Potassium Chloride Crys ER 20 MEQ 40 mEq Oral 2 times daily 05.27.2022 [name], MD Cardiology-this was the patient initial visited he was seeing for chronic systolic and congestive heart failure. medication changes were to increase amiodarone to 200 mg bid x 5 days, then 200 mg daily after that. Labs were also ordered.  Hospital visits:  None in previous 6 months  Medications: Outpatient Encounter Medications as of 11/12/2020  Medication Sig   amiodarone (PACERONE) 200 MG tablet Take 1 tablet by mouth 2 times daily for 5 days then decrease to 1 tablet by mouth daily.   Arginine (L-ARGININE-500) 500 MG CAPS Take 500 mg by mouth daily.   Ascorbic Acid (VITAMIN C) 1000 MG tablet Take 1,000 mg by mouth 2 (two) times daily.   atorvastatin (LIPITOR) 20 MG tablet Take 1 tablet (20 mg total) by mouth daily.   B Complex Vitamins (B COMPLEX PO) Take 1 tablet by mouth daily.   Biotin 10 MG TABS Take 10 mg by mouth daily. 10 mg - 10,000 mcg   budesonide-formoterol (SYMBICORT) 160-4.5 MCG/ACT inhaler Inhale 2 puffs into the lungs 2 (two) times daily.   CALCIUM PO Take 1 tablet by mouth 2 (two) times daily.   cholecalciferol (VITAMIN D3) 25 MCG (1000 UNIT) tablet Take 1,000 Units by mouth 2 (two) times daily.   CHROMIUM PO Take 1 tablet by mouth 2 (two) times daily.   dapagliflozin propanediol (FARXIGA) 10 MG TABS tablet Take 1 tablet (10 mg total) by mouth daily before breakfast.   digoxin (LANOXIN) 0.125 MG tablet Take 1 tablet (0.125 mg total) by mouth daily.   ELIQUIS 5 MG TABS tablet Take 5 mg by mouth 2 (two) times daily.   escitalopram (LEXAPRO) 10 MG tablet TAKE 1 TABLET(10  MG) BY MOUTH DAILY   levOCARNitine (L-CARNITINE) 500 MG TABS Take 500 mg by mouth daily.   losartan (COZAAR) 25 MG tablet Take 0.5 tablets (12.5 mg total) by mouth daily.   Magnesium 400 MG TABS Take 400 mg by mouth daily.   metFORMIN (GLUCOPHAGE) 500 MG tablet Take 1-3 tablets (500-1,500 mg total) by mouth See admin instructions. Take one tablet (500 mg) by mouth daily with breakfast and three tablets (1500 mg) daily with supper   milrinone (PRIMACOR) 20 MG/100 ML SOLN infusion Inject 0.0243 mg/min into the vein continuous.   ONETOUCH ULTRA test strip Use as instructed to check blood sugars twice daily   potassium chloride SA (KLOR-CON) 20 MEQ tablet Take 2 tablets (40 mEq total) by mouth 2 (two) times daily.   spironolactone (ALDACTONE) 25 MG tablet Take 1 tablet (25 mg total) by mouth daily.   torsemide (DEMADEX) 100 MG tablet Take 100 mg by mouth 2 (two) times daily.   vitamin E 180 MG (400 UNITS) capsule Take 400 Units by mouth daily.   Zinc 50 MG TABS Take 50 mg by mouth daily.   Zn-Pyg Afri-Nettle-Saw Palmet (SAW PALMETTO COMPLEX PO) Take 540 mg by mouth daily.   No facility-administered encounter medications on file as of 11/12/2020.  Reviewed chart prior to disease state call. Spoke with patient regarding BP  Recent Office Vitals: BP Readings from Last 3 Encounters:  10/26/20 (!) 98/58  10/20/20 Marland Kitchen)  118/55  10/15/20 110/64   Pulse Readings from Last 3 Encounters:  11/05/20 74  10/26/20 85  10/20/20 96    Wt Readings from Last 3 Encounters:  11/05/20 214 lb 12.8 oz (97.4 kg)  10/26/20 215 lb 6.4 oz (97.7 kg)  10/20/20 214 lb 6.4 oz (97.3 kg)     Kidney Function Lab Results  Component Value Date/Time   CREATININE 0.97 11/05/2020 12:52 PM   CREATININE 1.11 10/26/2020 02:55 PM   CREATININE 1.08 03/30/2020 02:43 PM   GFR 60.62 10/15/2020 02:59 PM   GFRNONAA >60 11/05/2020 12:52 PM   GFRNONAA 67 03/30/2020 02:43 PM   GFRAA 76 08/04/2020 01:54 PM   GFRAA 78 03/30/2020  02:43 PM    BMP Latest Ref Rng & Units 11/05/2020 10/26/2020 10/20/2020  Glucose 70 - 99 mg/dL 169(I) 503(U) 882(C)  BUN 8 - 23 mg/dL 20 23 00(L)  Creatinine 0.61 - 1.24 mg/dL 4.91 7.91 5.05  BUN/Creat Ratio 10 - 24 - 21 -  Sodium 135 - 145 mmol/L 133(L) 139 132(L)  Potassium 3.5 - 5.1 mmol/L 3.4(L) 4.0 3.6  Chloride 98 - 111 mmol/L 93(L) 92(L) 96(L)  CO2 22 - 32 mmol/L 30 27 27   Calcium 8.9 - 10.3 mg/dL 9.4 9.4 9.0    Current antihypertensive regimen:  Carvedilol 6.25 mg 1 tablet BID Farxiga 10 mg 1 tablet daily Spironolactone 25 mg 1 tablet daily Potassium chloride 40 mEq 2 tablets daily Torsemide 20 mg 5 tablets twice daily Losartan 25 mg 1/2 tablet daily How often are you checking your Blood Pressure? 3-5x per week Current home BP readings: Patient sent reading to CPP. What recent interventions/DTPs have been made by any provider to improve Blood Pressure control since last CPP Visit: Potassium chloride 40 mEq 2 tablets daily Any recent hospitalizations or ED visits since last visit with CPP? No What diet changes have been made to improve Blood Pressure Control?  No Change What exercise is being done to improve your Blood Pressure Control?  No Change  Adherence Review: Is the patient currently on ACE/ARB medication? Yes Does the patient have >5 day gap between last estimated fill dates? No  11/19/2020 I spoke with a representative at the Eliquis patient assistant. She stated that based on [name] Paperwork that he submitted about his income. His 3% is $1179 she stated that they gave him a credit of $421 and $0.56 as of May 25th, 2022, the patient would have to spend $270.90 more to qualify. I called and spoke with the patient about this information. He stated that he would start two say receipts from May 25th. He understood.   11/25/2020 I also spoke with the patient about medication adherence. He states that he has been doing fair. Currently, he did not have his blood  pressure readings with him because he was out of the house. He stated that he would call back or send them into the office. One change in his medication has been an increase of Potassium chloride by 40 mEq 2 tablets daily. There have been no other changes to his medication. There have been no changes in his diet and excise routine. There have been no ER or urgent care visit since his last PCP or CPP visit. He currently does not have any issues. This next CCM appointment is scheduled for November 2022.  Star Rating Drugs: Medication Dispensed  Quantity Pharmacy  Atorvastatin 20 mg 06.02.2022 90 [facility]  Losartan 25 mg 04.30.2022 30 [facility]  Metformin 500 mg  05.24.2022 90 [facility]    Amilia (Tomma Rakers, [place] Clinical Pharmacist Assistant [phone]

## 2020-11-16 ENCOUNTER — Encounter (HOSPITAL_COMMUNITY): Payer: Self-pay

## 2020-11-16 NOTE — Progress Notes (Signed)
Remote ICD transmission.   

## 2020-11-17 ENCOUNTER — Other Ambulatory Visit (HOSPITAL_COMMUNITY): Payer: Medicare Other

## 2020-11-17 ENCOUNTER — Telehealth: Payer: Self-pay | Admitting: Adult Health

## 2020-11-17 ENCOUNTER — Encounter (HOSPITAL_COMMUNITY): Payer: Self-pay

## 2020-11-17 NOTE — Telephone Encounter (Signed)
Patient dropped off form for handicap placard to be completed by [facility]  Paperwork placed in folder.  Patient would like a call at [phone] when paperwork has been completed and is ready for pickup.   Please advise.

## 2020-11-18 NOTE — Telephone Encounter (Signed)
Noted  

## 2020-11-19 NOTE — Telephone Encounter (Signed)
Called pt to advised that handicap placard is ready for pick up. Placard placed in front office filing cabinet

## 2020-11-19 NOTE — Telephone Encounter (Signed)
Pt called in and was given the below msg. 

## 2020-11-22 ENCOUNTER — Encounter (HOSPITAL_COMMUNITY): Payer: Medicare Other | Admitting: Cardiology

## 2020-11-22 ENCOUNTER — Telehealth (HOSPITAL_COMMUNITY): Payer: Self-pay

## 2020-11-23 NOTE — Addendum Note (Signed)
Addended by: Delrae Rend on: 11/23/2020 11:55 AM   Modules accepted: Orders

## 2020-11-24 ENCOUNTER — Other Ambulatory Visit: Payer: Self-pay | Admitting: Pharmacist

## 2020-11-24 ENCOUNTER — Encounter: Payer: Self-pay | Admitting: *Deleted

## 2020-11-24 DIAGNOSIS — I480 Paroxysmal atrial fibrillation: Secondary | ICD-10-CM

## 2020-11-24 MED ORDER — ELIQUIS 5 MG PO TABS: 5.0000 mg | ORAL_TABLET | Freq: Two times a day (BID) | ORAL | 3 refills | Status: AC

## 2020-11-24 NOTE — Telephone Encounter (Signed)
Letter printed.

## 2020-11-24 NOTE — Telephone Encounter (Deleted)
error 

## 2020-11-24 NOTE — Telephone Encounter (Signed)
**Note De-Identified Breydan Shillingburg Obfuscation** Letter received Curtisha Bendix fax from [facility] stating tat they denied the pt for asst with his Eliquis. Reason: Documentation of 3% out of pocket RX expenses, based on household adjusted base income, not met.  The letter states that they have notified the pt of this denial Brienne Liguori letter as well.

## 2020-11-26 ENCOUNTER — Encounter (HOSPITAL_COMMUNITY): Payer: Medicare Other | Admitting: Cardiology

## 2020-11-29 ENCOUNTER — Other Ambulatory Visit (HOSPITAL_COMMUNITY): Payer: Self-pay | Admitting: *Deleted

## 2020-11-29 ENCOUNTER — Encounter (HOSPITAL_COMMUNITY): Payer: Self-pay

## 2020-11-29 MED ORDER — DIGOXIN 125 MCG PO TABS: 0.1250 mg | ORAL_TABLET | Freq: Every day | ORAL | 3 refills | Status: DC

## 2020-12-03 ENCOUNTER — Encounter (HOSPITAL_COMMUNITY): Payer: Self-pay

## 2020-12-03 ENCOUNTER — Telehealth (HOSPITAL_COMMUNITY): Payer: Self-pay

## 2020-12-03 NOTE — Telephone Encounter (Signed)
Donita with advanced home care called to report that patient had a weight gain of 2.4lbs but no edema,shortness of breath and that his lungs sounded clear but patient has not had a large bowel movement despite taking liquid doculax. Per Prince Rome, NP have patient take spironolactone at night instead of in the morning, if symptoms worsen have him contact office. Donita made aware.

## 2020-12-14 NOTE — Progress Notes (Signed)
Cardiology Office Note:    Date:  12/15/2020   ID:  Chad Wilkerson, DOB [birth year], MRN [id number]  PCP:  [name], NP  [facility] Cardiologist:  Christell Constant, MD  [facility] Electrophysiologist:  Steffanie Dunn MD  Referring MD: [name], NP   CC: Follow up HFrEF  History of Present Illness:    Chad Wilkerson is a 75 y.o. male with a hx of Diabetes with HTN, HFrEF 25-30% with Medtronic ICD, 1st HB, mild to moderate AS, Mild MR, Atrial fibrillation on Dofetilide, eliquis, and coreg;  OSA on BiPAP seen 04/01/20.  In interim from that visit, established care with EP [name]). Started Toresmide 60 mg BID at last visit.  In interim had planned Low dose dobutamine stress echo for LFLGAS; we were unable to safely staff this procedure- went to [facility].  In interim of this visit, patient had valve that was easily crossed; RA 13; PCWP 18.  Last seen 2/22.  In interim of this visit, patient had EP follow up:  Thought to be safe for Tikoysn continuation and in SR.  Device noted increase in Optivol.  Seen 4/22 with repeat imaging performed:  at time of echo was having significant WOB.  Lead to prolonged hospitalization and eval with AHF, palliative milrinione, and LVAD work up.  Last seen by cardiology 11/05/20.  At that time had device shock for VT.  At last discussion there is a possible plan for LVAD.  Patient notes that he is doing well on milrinone.  No chest pain or pressure .  No SOB/DOE and no PND/Orthopnea.  No weight gain or leg swelling.  No palpitations or syncope.  Had episodes with malfunction of his milrinone that has now improved with the pump    Past Medical History:  Diagnosis Date   Abnormal liver function tests    Adjustment disorder    Aortic stenosis    Benign colon polyp    Chronic systolic CHF (congestive heart failure) (HCC)    Diabetes mellitus without complication (HCC)    Diverticulitis    Essential hypertension    Heart disease     Hypogonadism male    Hyponatremia    ICD (implantable cardioverter-defibrillator) in place    Mitral regurgitation    NICM (nonischemic cardiomyopathy) (HCC)    PAF (paroxysmal atrial fibrillation) (HCC)    Sleep apnea    Tricuspid regurgitation    Urine incontinence    Ventricular tachycardia (HCC)    Vitamin D deficiency     Past Surgical History:  Procedure Laterality Date   CHOLECYSTECTOMY     RIGHT HEART CATH N/A 10/05/2020   Procedure: RIGHT HEART CATH;  Surgeon: [name], MD;  Location: [facility] INVASIVE CV LAB;  Service: Cardiovascular;  Laterality: N/A;   RIGHT/LEFT HEART CATH AND CORONARY ANGIOGRAPHY N/A 07/19/2020   Procedure: RIGHT/LEFT HEART CATH AND CORONARY ANGIOGRAPHY;  Surgeon: Kathleene Hazel, MD;  Location: [facility] INVASIVE CV LAB;  Service: Cardiovascular;  Laterality: N/A;   TONSILLECTOMY      Current Medications: Current Meds  Medication Sig   amiodarone (PACERONE) 200 MG tablet Take 1 tablet by mouth 2 times daily for 5 days then decrease to 1 tablet by mouth daily.   Arginine (L-ARGININE-500) 500 MG CAPS Take 500 mg by mouth daily.   Ascorbic Acid (VITAMIN C) 1000 MG tablet Take 1,000 mg by mouth 2 (two) times daily.   atorvastatin (LIPITOR) 20 MG tablet Take 1 tablet (20 mg total) by  mouth daily.   B Complex Vitamins (B COMPLEX PO) Take 1 tablet by mouth daily.   Biotin 10 MG TABS Take 10 mg by mouth daily. 10 mg - 10,000 mcg   budesonide-formoterol (SYMBICORT) 160-4.5 MCG/ACT inhaler Inhale 2 puffs into the lungs 2 (two) times daily.   CALCIUM PO Take 1 tablet by mouth 2 (two) times daily.   cholecalciferol (VITAMIN D3) 25 MCG (1000 UNIT) tablet Take 1,000 Units by mouth 2 (two) times daily.   CHROMIUM PO Take 1 tablet by mouth 2 (two) times daily.   dapagliflozin propanediol (FARXIGA) 10 MG TABS tablet Take 1 tablet (10 mg total) by mouth daily before breakfast.   digoxin (LANOXIN) 0.125 MG tablet Take 1 tablet (0.125 mg total) by mouth daily.    ELIQUIS 5 MG TABS tablet Take 1 tablet (5 mg total) by mouth 2 (two) times daily.   escitalopram (LEXAPRO) 10 MG tablet TAKE 1 TABLET(10 MG) BY MOUTH DAILY   levOCARNitine (L-CARNITINE) 500 MG TABS Take 500 mg by mouth daily.   losartan (COZAAR) 25 MG tablet Take 0.5 tablets (12.5 mg total) by mouth daily.   Magnesium 400 MG TABS Take 400 mg by mouth daily.   metFORMIN (GLUCOPHAGE) 500 MG tablet Take 1-3 tablets (500-1,500 mg total) by mouth See admin instructions. Take one tablet (500 mg) by mouth daily with breakfast and three tablets (1500 mg) daily with supper   milrinone (PRIMACOR) 20 MG/100 ML SOLN infusion Inject 0.0243 mg/min into the vein continuous.   ONETOUCH ULTRA test strip Use as instructed to check blood sugars twice daily   potassium chloride SA (KLOR-CON) 20 MEQ tablet Take 2 tablets (40 mEq total) by mouth 2 (two) times daily.   spironolactone (ALDACTONE) 25 MG tablet Take 1 tablet (25 mg total) by mouth daily.   torsemide (DEMADEX) 100 MG tablet Take 100 mg by mouth 2 (two) times daily.   vitamin E 180 MG (400 UNITS) capsule Take 400 Units by mouth daily.   Zinc 50 MG TABS Take 50 mg by mouth daily.   Zn-Pyg Afri-Nettle-Saw Palmet (SAW PALMETTO COMPLEX PO) Take 540 mg by mouth daily.    Allergies:   Semaglutide   Social History   Socioeconomic History   Marital status: Widowed    Spouse name: Not on file   Number of children: Not on file   Years of education: Not on file   Highest education level: Not on file  Occupational History   Not on file  Tobacco Use   Smoking status: Former    Pack years: 0.00   Smokeless tobacco: Never  Vaping Use   Vaping Use: Never used  Substance and Sexual Activity   Alcohol use: Not Currently   Drug use: Not Currently   Sexual activity: Not on file  Other Topics Concern   Not on file  Social History Narrative   Not on file   Social Determinants of Health   Financial Resource Strain: Low Risk    Difficulty of Paying Living  Expenses: Not very hard  Food Insecurity: No Food Insecurity   Worried About Running Out of Food in the Last Year: Never true   Ran Out of Food in the Last Year: Never true  Transportation Needs: No Transportation Needs   Lack of Transportation (Medical): No   Lack of Transportation (Non-Medical): No  Physical Activity: Inactive   Days of Exercise per Week: 0 days   Minutes of Exercise per Session: 0 min  Stress: No  Stress Concern Present   Feeling of Stress : Not at all  Social Connections: Moderately Isolated   Frequency of Communication with Friends and Family: Once a week   Frequency of Social Gatherings with Friends and Family: More than three times a week   Attends Religious Services: Never   Database administrator or Organizations: No   Attends Engineer, structural: Never   Marital Status: Living with partner    Family History: The patient's family history includes Asthma in his father; Heart attack in his mother and paternal grandfather; High Cholesterol in his mother; Kidney disease in his sister; Stroke in his maternal grandfather.  ROS:   Please see the history of present illness.    All other systems reviewed and are negative.  EKGs/Labs/Other Studies Reviewed:    The following studies were reviewed today:  EKG:   SR 68 1st HB ventricular bigeminy, QTc ~ 512 07/12/20: SR rate 83 with 1st HB PR prolongation, IVCD, QRS 130, Frequent PVCs QTc 500 04/02/20: SR with 1st HB PR of 300 anterior infarct pattern and QTc 470 05/10/20 SR 1st Hb 300, occasional PVC, QTc 487 [place]  Arterial ABI and Duplex: Date: 07/26/20 Results: Summary:  Right: Resting right ankle-brachial index is within normal range.  No evidence of significant right lower extremity arterial disease. The  right toe-brachial index is normal.   Left: Resting left ankle-brachial index is within normal range.  No evidence of significant left lower extremity arterial disease. The left  toe-brachial  index is normal.   Transthoracic Echocardiogram: Date: 04/23/20 Results:  1. Left ventricular ejection fraction, by estimation, is 20 to 25%. The  left ventricle has severely decreased function. The left ventricle  demonstrates global hypokinesis. The left ventricular internal cavity size  was severely dilated. Indeterminate  diastolic filling due to E-A fusion.   2. Right ventricular systolic function is moderately reduced. The right  ventricular size is normal. Mildly increased right ventricular wall  thickness. There is moderately elevated pulmonary artery systolic  pressure.   3. AV is thickened, calcified with restricted motion. By 2D imaging  appears swverely restricted. Peak and mean gradients through the valve are  24 and 15 mm Hg respectively. Gradients probably relfect depressed LVEF.  Dimensionless index is 0.36  consistent with moderate AS. Consider TEE to furhter define .   4. Left atrial size was moderately dilated.   5. Mild mitral valve regurgitation. Moderate mitral annular  calcification.   6. There is mild dilatation of the ascending aorta, measuring 42 mm.   7. The inferior vena cava is dilated in size with <50% respiratory  variability, suggesting right atrial pressure of 15 mmHg.   Left/Right Heart Catheterizations: Date: 07/19/20 Results: Prox RCA to Mid RCA lesion is 20% stenosed. 1st Mrg lesion is 40% stenosed. Mid LAD lesion is 20% stenosed.   1. Mild non-obstructive CAD 2. Elevated filling pressures: RA:13, RV 64/8/14 PA 64/26 (mean 40), PCWP: 28, LV: 101/15/25  AO: 90/60 CO: 4.43 L/min  CI: 2.04  3. Moderate aortic stenosis. He has low gradients across the valve by echo and by cath. Cath: mean gradient 10.5 mmHg, peak to peak gradient 12 mmhg, AVA 1.83 cm2).  The valve was easily crossed with the J wire. Cannot exclude low flow/low gradient AS but the dimensionless index and AVA findings on echo would also argue against this being severe.     Recommendations: He is volume overloaded and dyspneic even with speaking. Will admit for diuresis  with IV Lasix. He has severe LV systolic dysfunction with low gradients across the aortic valve. The valve was easily crossed with a J wire. I suspect that he has moderate aortic stenosis. (Echo with AVA over 1.0 cm2, dimensionless index 0.36). Will review his findings with our structural heart team.   Date: 10/05/20 Good cardiac output and PAPI on milrinone. 2. Elevated L>R heart filling pressures. 3. Moderate pulmonary venous hypertension.   Will continue milrinone 0.25 for now and restart Lasix gtt, will need further diuresis.  Continue LVAD discussions.   Recent Labs: 04/01/2020: NT-Pro BNP 5,001 09/28/2020: B Natriuretic Peptide 3,328.4 11/05/2020: ALT 60; BUN 20; Creatinine, Ser 0.97; Hemoglobin 15.0; Magnesium 2.3; Platelets 282; Potassium 3.4; Sodium 133; TSH 3.138  Risk Assessment/Calculations:     CHA2DS2-VASc Score = 3  This indicates a 3.2% annual risk of stroke. The patient's score is based upon: CHF History: Yes HTN History: Yes Diabetes History: No Stroke History: No Vascular Disease History: No Age Score: 1 Gender Score: 0     Physical Exam:    VS:  BP 104/60   Pulse 72   Ht 5\' 8"  (1.727 m)   Wt 98.9 kg   SpO2 95%   BMI 33.15 kg/m     Wt Readings from Last 3 Encounters:  12/15/20 98.9 kg  11/05/20 97.4 kg  10/26/20 97.7 kg    GEN: Obese well developed in no acute distress HEENT: Normal NECK: No JVD ; No carotid bruits LYMPHATICS: No lymphadenopathy CARDIAC: RRR, I/VI systolic flow murmur  rubs, gallops, No RV Heave RESPIRATORY:  Clear to auscultation without rales, wheezing or rhonchi  ABDOMEN: Soft, non-tender distended with slight fluid wave, dullness to percussion MUSCULOSKELETAL:  No edema No deformity  SKIN: Warm and dry NEUROLOGIC:  Alert and oriented x 3 PSYCHIATRIC:  Normal affect   ASSESSMENT:    1. ICD (implantable  cardioverter-defibrillator) in place   2. NICM (nonischemic cardiomyopathy) (HCC)   3. Heart failure with reduced ejection fraction (HCC)   4. Paroxysmal atrial fibrillation (HCC)   5. Moderate aortic stenosis   6. Morbid obesity (HCC)     PLAN:    In order of problems listed above:  Heart Failure Reduced EF  20-25% Stage D HF Moderate AS PAF Mild MR Diabetes with hypertension Morbid Obesity  - NYHA class II, Stage D , hypervolemic, etiology from A fib on DDX - no medication changes at this time  Discussed the pros and cons of LVAD including bleeding risks, drive line infections; historic nature of pump thrombosis.  Discussed long term survival on palliative inotropes and that future LVAD may not be an option with inotrope failure.  Discussed [facility] LVAD program; discussed other AHF programs in the region.  At this point patient is still weighing his options.  Still not driving.    Time Spent Directly with Patient:   I have spent a total of 40 minutes with the patient reviewing notes, imaging, EKGs, labs and examining the patient as well as establishing an assessment and plan that was discussed personally with the patient.  > 50% of time was spent in direct patient care and family.  Will see in 3-4 months; patient to call if he would like assist with OSH 2nd opinion.   Medication Adjustments/Labs and Tests Ordered: Current medicines are reviewed at length with the patient today.  Concerns regarding medicines are outlined above.  No orders of the defined types were placed in this encounter.  No orders of the  defined types were placed in this encounter.   Patient Instructions  Medication Instructions:  Your physician recommends that you continue on your current medications as directed. Please refer to the Current Medication list given to you today.  *If you need a refill on your cardiac medications before your next appointment, please call your pharmacy*   Lab Work: NONE If  you have labs (blood work) drawn today and your tests are completely normal, you will receive your results only by: MyChart Message (if you have MyChart) OR A paper copy in the mail If you have any lab test that is abnormal or we need to change your treatment, we will call you to review the results.   Testing/Procedures: NONE   Follow-Up: At [facility], you and your health needs are our priority.  As part of our continuing mission to provide you with exceptional heart care, we have created designated Provider Care Teams.  These Care Teams include your primary Cardiologist (physician) and Advanced Practice Providers (APPs -  Physician Assistants and Nurse Practitioners) who all work together to provide you with the care you need, when you need it.  We recommend signing up for the patient portal called "MyChart".  Sign up information is provided on this After Visit Summary.  MyChart is used to connect with patients for Virtual Visits (Telemedicine).  Patients are able to view lab/test results, encounter notes, upcoming appointments, etc.  Non-urgent messages can be sent to your provider as well.   To learn more about what you can do with MyChart, go to [email or web address].    Your next appointment:   3-4 month(s)  The format for your next appointment:   In Person  Provider:   You may see Christell ConstantMahesh A Annahi Short, MD or one of the following Advanced Practice Providers on your designated Care Team:   Ronie Spiesayna Dunn, PA-C Jacolyn ReedyMichele Lenze, PA-C      Signed, Christell ConstantMahesh A Jnae Thomaston, MD  12/15/2020 1:02 PM    [facility]

## 2020-12-15 ENCOUNTER — Encounter: Payer: Self-pay | Admitting: Internal Medicine

## 2020-12-15 ENCOUNTER — Other Ambulatory Visit: Payer: Self-pay

## 2020-12-15 ENCOUNTER — Ambulatory Visit (INDEPENDENT_AMBULATORY_CARE_PROVIDER_SITE_OTHER): Payer: Medicare Other | Admitting: Internal Medicine

## 2020-12-15 ENCOUNTER — Encounter: Payer: Self-pay | Admitting: Adult Health

## 2020-12-15 VITALS — BP 104/60 | HR 72 | Ht 68.0 in | Wt 218.0 lb

## 2020-12-15 DIAGNOSIS — I5020 Unspecified systolic (congestive) heart failure: Secondary | ICD-10-CM | POA: Diagnosis not present

## 2020-12-15 DIAGNOSIS — I428 Other cardiomyopathies: Secondary | ICD-10-CM | POA: Diagnosis not present

## 2020-12-15 DIAGNOSIS — I350 Nonrheumatic aortic (valve) stenosis: Secondary | ICD-10-CM

## 2020-12-15 DIAGNOSIS — I480 Paroxysmal atrial fibrillation: Secondary | ICD-10-CM | POA: Diagnosis not present

## 2020-12-15 DIAGNOSIS — Z95810 Presence of automatic (implantable) cardiac defibrillator: Secondary | ICD-10-CM | POA: Diagnosis not present

## 2020-12-15 MED ORDER — ESCITALOPRAM OXALATE 10 MG PO TABS: ORAL_TABLET | ORAL | 0 refills | Status: DC

## 2020-12-15 NOTE — Patient Instructions (Signed)
Medication Instructions:  Your physician recommends that you continue on your current medications as directed. Please refer to the Current Medication list given to you today.  *If you need a refill on your cardiac medications before your next appointment, please call your pharmacy*   Lab Work: NONE If you have labs (blood work) drawn today and your tests are completely normal, you will receive your results only by: MyChart Message (if you have MyChart) OR A paper copy in the mail If you have any lab test that is abnormal or we need to change your treatment, we will call you to review the results.   Testing/Procedures: NONE   Follow-Up: At [facility], you and your health needs are our priority.  As part of our continuing mission to provide you with exceptional heart care, we have created designated Provider Care Teams.  These Care Teams include your primary Cardiologist (physician) and Advanced Practice Providers (APPs -  Physician Assistants and Nurse Practitioners) who all work together to provide you with the care you need, when you need it.  We recommend signing up for the patient portal called "MyChart".  Sign up information is provided on this After Visit Summary.  MyChart is used to connect with patients for Virtual Visits (Telemedicine).  Patients are able to view lab/test results, encounter notes, upcoming appointments, etc.  Non-urgent messages can be sent to your provider as well.   To learn more about what you can do with MyChart, go to [email or web address].    Your next appointment:   3-4 month(s)  The format for your next appointment:   In Person  Provider:   You may see Christell Constant, MD or one of the following Advanced Practice Providers on your designated Care Team:   Ronie Spies, PA-C Jacolyn Reedy, PA-C

## 2020-12-16 ENCOUNTER — Encounter (HOSPITAL_COMMUNITY): Payer: Self-pay | Admitting: Cardiology

## 2020-12-16 ENCOUNTER — Ambulatory Visit (HOSPITAL_COMMUNITY)
Admission: RE | Admit: 2020-12-16 | Discharge: 2020-12-16 | Disposition: A | Discharge status: Home / Self Care | Payer: Medicare Other | Source: Ambulatory Visit | Attending: Cardiology | Admitting: Cardiology

## 2020-12-16 VITALS — BP 104/60 | HR 66 | Wt 221.2 lb

## 2020-12-16 DIAGNOSIS — Z87891 Personal history of nicotine dependence: Secondary | ICD-10-CM | POA: Diagnosis not present

## 2020-12-16 DIAGNOSIS — I440 Atrioventricular block, first degree: Secondary | ICD-10-CM | POA: Insufficient documentation

## 2020-12-16 DIAGNOSIS — I110 Hypertensive heart disease with heart failure: Secondary | ICD-10-CM | POA: Diagnosis not present

## 2020-12-16 DIAGNOSIS — I5022 Chronic systolic (congestive) heart failure: Secondary | ICD-10-CM | POA: Diagnosis not present

## 2020-12-16 DIAGNOSIS — I350 Nonrheumatic aortic (valve) stenosis: Secondary | ICD-10-CM | POA: Insufficient documentation

## 2020-12-16 DIAGNOSIS — I428 Other cardiomyopathies: Secondary | ICD-10-CM | POA: Insufficient documentation

## 2020-12-16 DIAGNOSIS — R7989 Other specified abnormal findings of blood chemistry: Secondary | ICD-10-CM | POA: Diagnosis not present

## 2020-12-16 DIAGNOSIS — I5020 Unspecified systolic (congestive) heart failure: Secondary | ICD-10-CM

## 2020-12-16 DIAGNOSIS — I472 Ventricular tachycardia: Secondary | ICD-10-CM | POA: Insufficient documentation

## 2020-12-16 DIAGNOSIS — Z7901 Long term (current) use of anticoagulants: Secondary | ICD-10-CM | POA: Diagnosis not present

## 2020-12-16 DIAGNOSIS — Z8249 Family history of ischemic heart disease and other diseases of the circulatory system: Secondary | ICD-10-CM | POA: Insufficient documentation

## 2020-12-16 DIAGNOSIS — Z79899 Other long term (current) drug therapy: Secondary | ICD-10-CM | POA: Insufficient documentation

## 2020-12-16 DIAGNOSIS — Z7984 Long term (current) use of oral hypoglycemic drugs: Secondary | ICD-10-CM | POA: Insufficient documentation

## 2020-12-16 DIAGNOSIS — E871 Hypo-osmolality and hyponatremia: Secondary | ICD-10-CM | POA: Insufficient documentation

## 2020-12-16 DIAGNOSIS — I2510 Atherosclerotic heart disease of native coronary artery without angina pectoris: Secondary | ICD-10-CM | POA: Insufficient documentation

## 2020-12-16 DIAGNOSIS — I480 Paroxysmal atrial fibrillation: Secondary | ICD-10-CM | POA: Insufficient documentation

## 2020-12-16 LAB — COMPREHENSIVE METABOLIC PANEL
ALT: 25 U/L (ref 0–44)
AST: 27 U/L (ref 15–41)
Albumin: 4 g/dL (ref 3.5–5.0)
Alkaline Phosphatase: 55 U/L (ref 38–126)
Anion gap: 11 (ref 5–15)
BUN: 17 mg/dL (ref 8–23)
CO2: 31 mmol/L (ref 22–32)
Calcium: 9.3 mg/dL (ref 8.9–10.3)
Chloride: 91 mmol/L — ABNORMAL LOW (ref 98–111)
Creatinine, Ser: 0.93 mg/dL (ref 0.61–1.24)
GFR, Estimated: >60 mL/min (ref >60)
Glucose, Bld: 172 mg/dL — ABNORMAL HIGH (ref 70–99)
Potassium: 3.3 mmol/L — ABNORMAL LOW (ref 3.5–5.1)
Sodium: 133 mmol/L — ABNORMAL LOW (ref 135–145)
Total Bilirubin: 1.1 mg/dL (ref 0.3–1.2)
Total Protein: 7.9 g/dL (ref 6.5–8.1)

## 2020-12-16 LAB — CBC
HCT: 41.7 % (ref 39.0–52.0)
Hemoglobin: 13.8 g/dL (ref 13.0–17.0)
MCH: 28.9 pg (ref 26.0–34.0)
MCHC: 33.1 g/dL (ref 30.0–36.0)
MCV: 87.4 fL (ref 80.0–100.0)
Platelets: 262 10*3/uL (ref 150–400)
RBC: 4.77 MIL/uL (ref 4.22–5.81)
RDW: 18.1 % — ABNORMAL HIGH (ref 11.5–15.5)
WBC: 9.5 10*3/uL (ref 4.0–10.5)
nRBC: 0 % (ref 0.0–0.2)

## 2020-12-16 LAB — DIGOXIN LEVEL: Digoxin Level: 0.6 ng/mL — ABNORMAL LOW (ref 0.8–2.0)

## 2020-12-16 LAB — TSH: TSH: 3.491 u[IU]/mL (ref 0.350–4.500)

## 2020-12-16 LAB — MAGNESIUM: Magnesium: 2.5 mg/dL — ABNORMAL HIGH (ref 1.7–2.4)

## 2020-12-16 MED ORDER — METOLAZONE 2.5 MG PO TABS: 2.5000 mg | ORAL_TABLET | ORAL | 3 refills | Status: DC

## 2020-12-16 MED ORDER — POTASSIUM CHLORIDE CRYS ER 20 MEQ PO TBCR: 40.0000 meq | EXTENDED_RELEASE_TABLET | Freq: Two times a day (BID) | ORAL | 6 refills | Status: DC

## 2020-12-16 NOTE — Patient Instructions (Addendum)
EKG done today.  Labs done today. We will contact you only if your labs are abnormal.  START Metolazone 2.5mg  (1 tablet) by mouth daily on fridays.   TAKE 2 EXTRA TABLETS OF POTASSIUM ON THE DAYS YOU TAKE METOLAZONE.  No other medication changes were made. Please continue all current medications as prescribed.  Your physician recommends that you schedule a follow-up appointment in: 6 weeks with Dr. Shirlee Latch  If you have any questions or concerns before your next appointment please send [place] a message through [facility] or call our office at [phone].    TO LEAVE A MESSAGE FOR THE NURSE SELECT OPTION 2, PLEASE LEAVE A MESSAGE INCLUDING: YOUR NAME DATE OF BIRTH CALL BACK NUMBER REASON FOR CALL**this is important as we prioritize the call backs  YOU WILL RECEIVE A CALL BACK THE SAME DAY AS LONG AS YOU CALL BEFORE 4:00 PM   Do the following things EVERYDAY: Weigh yourself in the morning before breakfast. Write it down and keep it in a log. Take your medicines as prescribed Eat low salt foods--Limit salt (sodium) to 2000 mg per day.  Stay as active as you can everyday Limit all fluids for the day to less than 2 liters   At [facility], you and your health needs are our priority. As part of our continuing mission to provide you with exceptional heart care, we have created designated Provider Care Teams. These Care Teams include your primary Cardiologist (physician) and Advanced Practice Providers (APPs- Physician Assistants and Nurse Practitioners) who all work together to provide you with the care you need, when you need it.   You may see any of the following providers on your designated Care Team at your next follow up: Dr Arvilla Meres Dr Carron Curie, NP Robbie Lis, [place] Karle Plumber, PharmD   Please be sure to bring in all your medications bottles to every appointment.

## 2020-12-16 NOTE — Progress Notes (Signed)
PCP: Dorothyann Peng, NP Cardiology: Dr Gasper Sells HF Cardiology: Dr. Aundra Dubin  75 y.o. with history of chronic systolic CHF, paroxysmal atrial fibrillation, and type 2 diabetes presents for followup of CHF.  Patient has a long history of nonischemic cardiomyopathy, has Medtronic ICD.  Cath in 2/22 showed mild nonobstructive CAD.  Echo in 4/22 showed EF < 20%, severe LV dilation, moderately decreased RV systolic function with mild RV enlargement, severe biatrial enlargement, mild-moderate MR, no more than moderate AS. Recent workup has suggested no more than moderate aortic stenosis, not thought to be severe enough for TAVR.   Patient was admitted in 4/22 with acute on chronic systolic CHF and marked volume overload.  Co-ox was low and milrinone 0.25 was started.  He was extensively diuresed.  He had sustained VT terminated by ICD discharge while in the hospital.  Tikosyn was stopped and amiodarone was started. In the past, he had not tolerated amiodarone but has had no problem with it this time.  I tried to wean him off milrinone, but co-ox dropped to 49% so I restarted it, and he went home on milrinone.  We discussed LVAD while he was in the hospital and workup was completed.  He wanted to go home for some time to think about it.   He returns for followup today.  He remains on milrinone 0.25.  No further episodes of VT.  Weight is up about 7 lbs.  He has been following a relatively high sodium diet. No exertional dyspnea walking on flat ground or up a flight of stairs.  No orthopnea/PND.     Labs (5/22): K 3.6, Na 132, creatinine 1.05 => 1.11 => 0.97, AST 49, ALT 53, digoxin 0.9  Medtronic device interrogation: Fluid index rising but still < threshold.  No further VT, no AF.   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   PMH:  1. HTN 2. Type 2 diabetes 3. Atrial fibrillation: Paroxysmal.  Initially on dofetilide, now on amiodarone.  4. OSA: uses Bipap 5. 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  Recurrent episode in 5/22.  6. Chronic systolic CHF: Nonischemic cardiomyopathy. Medtronic ICD.  - LHC/RHC (2/22): Mild nonobstructive CAD, CI low at 2.04. Moderate aortic stenosis.  - Echo (4/22): EF < 20%, severe LV dilation, moderately decreased RV systolic function with mild RV enlargement, severe biatrial enlargement, mild-moderate MR, no more than moderate AS.  - RHC (on milrinone): mean RA 10, PA 64/23, mean PCWP 23, CI 2.89, PAPi 4.1.  - Co-ox 49% off milrinone.  7. Aortic stenosis: Moderate by echo and cath.  8. Elevated LFTs  Social History   Socioeconomic History   Marital status: Widowed    Spouse name: Not on file   Number of children: Not on file   Years of education: Not on file   Highest education level: Not on file  Occupational History   Not on file  Tobacco Use   Smoking status: Former    Pack years: 0.00   Smokeless tobacco: Never  Vaping Use   Vaping Use: Never used  Substance and Sexual Activity   Alcohol use: Not Currently   Drug use: Not Currently   Sexual activity: Not on file  Other Topics Concern   Not on file  Social History Narrative   Not on file   Social Determinants of Health   Financial Resource Strain: Low Risk    Difficulty of Paying Living Expenses: Not very hard  Food Insecurity: No Food Insecurity   Worried About Charity fundraiser in the Last Year: Never true   Ran Out of Food in the Last Year: Never true  Transportation Needs: No Transportation Needs   Lack of Transportation (Medical): No   Lack of Transportation (Non-Medical): No  Physical Activity: Inactive   Days of Exercise per Week: 0 days   Minutes of Exercise per Session: 0 min  Stress: No Stress Concern Present   Feeling of Stress : Not at all  Social Connections: Moderately Isolated   Frequency of Communication with Friends and Family: Once a week   Frequency of Social Gatherings with Friends and Family: More than three times a week    Attends Religious Services: Never   Marine scientist or Organizations: No   Attends Music therapist: Never   Marital Status: Living with partner  Intimate Partner Violence: Not At Risk   Fear of Current or Ex-Partner: No   Emotionally Abused: No   Physically Abused: No   Sexually Abused: No   Family History  Problem Relation Age of Onset   Heart attack Mother    High Cholesterol Mother    Asthma Father    Kidney disease Sister    Stroke Maternal Grandfather    Heart attack Paternal Grandfather    ROS: All systems reviewed and negative except as per HPI.   Current Outpatient Medications  Medication Sig Dispense Refill   amiodarone (PACERONE) 200 MG tablet Take 200 mg by mouth daily.     Arginine (L-ARGININE-500) 500 MG CAPS Take 500 mg by mouth daily.     Ascorbic Acid (VITAMIN C) 1000 MG tablet Take 1,000 mg by mouth 2 (two) times daily.     atorvastatin (LIPITOR) 20 MG tablet Take 1 tablet (20 mg total) by mouth daily. 30 tablet 3   B Complex Vitamins (B COMPLEX PO) Take 1 tablet by mouth daily.     Biotin 10 MG TABS Take 10 mg by mouth daily. 10 mg - 10,000 mcg     budesonide-formoterol (SYMBICORT) 160-4.5 MCG/ACT inhaler Inhale 2 puffs into the lungs 2 (two) times daily. 30.6 g 3   CALCIUM PO Take 1 tablet by mouth 2 (two) times daily.     cholecalciferol (VITAMIN D3) 25 MCG (1000 UNIT) tablet Take 1,000 Units by mouth 2 (two) times daily.     CHROMIUM PO Take 1 tablet by mouth 2 (two) times daily.     dapagliflozin propanediol (FARXIGA) 10 MG TABS tablet Take 1 tablet (10 mg total) by mouth daily before breakfast. 28 tablet 0   digoxin (LANOXIN) 0.125 MG tablet Take 1 tablet (0.125 mg total) by mouth daily. 30 tablet 3   ELIQUIS 5 MG TABS tablet Take 1 tablet (5 mg total) by mouth 2 (two) times daily. 180 tablet 3   escitalopram (LEXAPRO) 10 MG tablet TAKE 1 TABLET(10 MG) BY MOUTH DAILY 90 tablet 0   levOCARNitine (L-CARNITINE) 500 MG TABS Take 500 mg by  mouth daily.     losartan (COZAAR) 25 MG tablet Take 0.5 tablets (12.5 mg total) by mouth daily. 90 tablet 3   Magnesium 400 MG TABS Take 400 mg by mouth daily. 60 tablet 3   metFORMIN (GLUCOPHAGE) 500 MG tablet Take 1-3 tablets (500-1,500 mg total) by mouth See admin instructions. Take one tablet (500 mg) by mouth daily with breakfast and three tablets (1500 mg) daily with supper     [START ON 12/17/2020] metolazone (  ZAROXOLYN) 2.5 MG tablet Take 1 tablet (2.5 mg total) by mouth every Friday. 12 tablet 3   milrinone (PRIMACOR) 20 MG/100 ML SOLN infusion Inject 0.0243 mg/min into the vein continuous.     ONETOUCH ULTRA test strip Use as instructed to check blood sugars twice daily 600 each 3   spironolactone (ALDACTONE) 25 MG tablet Take 1 tablet (25 mg total) by mouth daily. 90 tablet 3   torsemide (DEMADEX) 100 MG tablet Take 100 mg by mouth 2 (two) times daily.     vitamin E 180 MG (400 UNITS) capsule Take 400 Units by mouth daily.     Zinc 50 MG TABS Take 50 mg by mouth daily.     Zn-Pyg Afri-Nettle-Saw Palmet (SAW PALMETTO COMPLEX PO) Take 540 mg by mouth daily.     potassium chloride SA (KLOR-CON) 20 MEQ tablet Take 2 tablets (40 mEq total) by mouth 2 (two) times daily. Take 2 extra tablets on metolazone days 220 tablet 6   No current facility-administered medications for this encounter.   BP 104/60   Pulse 66   Wt 100.3 kg (221 lb 3.2 oz)   SpO2 95%   BMI 33.63 kg/m  General: NAD Neck: JVP 8-9 cm, no thyromegaly or thyroid nodule.  Lungs: Clear to auscultation bilaterally with normal respiratory effort. CV: Nondisplaced PMI.  Heart regular S1/S2, no S3/S4, no murmur.  No peripheral edema.  No carotid bruit.  Normal pedal pulses.  Abdomen: Soft, nontender, no hepatosplenomegaly, mild distention.  Skin: Intact without lesions or rashes.  Neurologic: Alert and oriented x 3.  Psych: Normal affect. Extremities: No clubbing or cyanosis.  HEENT: Normal.   Assessment/Plan: 1. Chronic  systolic CHF: Long-standing NICM, followed in the past in [place].  Has MDT ICD.  Echo 09/28/20 with EF <20%, severe LV dilation, mild RV dilation with moderate RV dysfunction, mild-moderate MR, moderate-severe TR, severe biatrial enlargement, no more than moderate AS.  Last cath in 2/22 showed nonobstructive mild CAD, elevated right and left heart filling pressures with low CI 2.04; AS no more than moderate.  Cause of cardiomyopathy uncertain, he is generally in NSR so unlikely to be tachy-mediated CMP.  Possible remote viral infection/viral myocarditis. NYHA class IV at 4/22 admission.  He was diuresed on milrinone, then milrinone stopped with drop in co-ox to 49%.  Milrinone restarted at 0.25, co-ox back up to 68% and discharged home on milrinone.   NYHA class II-III (much improved on milrinone).  Weight is up 7 lbs, mild volume overload on exam and fluid index heading up on Optivol.  - We discussed sodium restriction.    - Continue milrinone 0.25, he is milrinone dependent due to low output HF.  Continuing milrinone long-term is not a great option for him given recurrent VT/VF with ICD shocks.  - Continue torsemide 100 mg bid. BMET today.  - I will add metolazone 2.5 mg once weekly with an extra KCl 40 on metolazone days (Fridays).  Repeat BMET 10 days.  - Continue digoxin 0.125 daily, check level today.  - Continue Farxiga and spironolactone 25 mg daily.  - Continue losartan 12.5 daily.  - He has low output HF.  RV function on RHC appeared adequate with PAPi 4.1, creatinine stable.  We have been discussing LVAD and he has had full workup, he has met an LVAD patient and was able to get his questions about the LVAD answered.  He wanted to go home with milrinone to give him a break from the  hospital.  We have had discussions about the limitations of milrinone and about how LVAD is really the only good option at this point to reduce mortality, I reiterated this today.   He still is not sure what he wants  to do.  Fortunately, he has had no further VT.  I will follow him closely in CHF clinic until he is ready for LVAD placement. He asks about any other medical options.  Unfortunately, they are few. We can try him on omecamtiv when it is available.  Barostimulation activation therapy has not been studied in patients with end stage CHF on inotropes.  2. Atrial fibrillation: Paroxysmal.  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 Off Tikosyn after VF on 4/22.  Did not tolerate amiodarone in the past but doing well on it so far.  - Continue amiodarone.   - Continue Eliquis.  3. Aortic stenosis: Based on full evaluation (cath and echo), suspect no more than moderate aortic stenosis.  I do not think he would benefit from TAVR at this point.  4. 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  - Continue statin, do not think this has caused increased LFTs.  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  Milrinone decreased to 0.25.  Tikosyn stopped and amiodarone started. No VT since 5/22.  Risk will be high as long as he is on milrinone.  - Check BMET, Mg.  - Continue amiodarone 200 mg daily. Check LFTs, TSH today.  He will need a regular eye exam.   6. Hyponatremia: History of hypervolemic hyponatremia.  - Keep fluid restricted.  7. Elevated LFTs:  ?Amiodarone, ?CHF.  Doubt related to statin. Viral hepatitis labs negative.  Abdominal US with possible early cirrhosis, no gallbladder.  - Check LFTs today.   Followup in 6 wks with me.  Encouraged him to continue to think about LVAD as his window is likely limited.    Loralie Champagne 12/16/2020

## 2020-12-17 ENCOUNTER — Encounter (HOSPITAL_COMMUNITY): Payer: Self-pay

## 2020-12-20 ENCOUNTER — Encounter (HOSPITAL_COMMUNITY): Payer: Medicare Other

## 2020-12-21 ENCOUNTER — Encounter (HOSPITAL_COMMUNITY): Payer: Self-pay

## 2020-12-22 ENCOUNTER — Other Ambulatory Visit (HOSPITAL_COMMUNITY): Payer: Self-pay | Admitting: *Deleted

## 2020-12-22 MED ORDER — TORSEMIDE 100 MG PO TABS: 100.0000 mg | ORAL_TABLET | Freq: Two times a day (BID) | ORAL | 3 refills | Status: DC

## 2020-12-24 ENCOUNTER — Encounter (HOSPITAL_COMMUNITY): Payer: Self-pay | Admitting: Cardiology

## 2020-12-28 ENCOUNTER — Other Ambulatory Visit: Payer: Self-pay

## 2021-01-02 ENCOUNTER — Encounter (HOSPITAL_COMMUNITY): Payer: Self-pay

## 2021-01-05 ENCOUNTER — Encounter (HOSPITAL_COMMUNITY): Payer: Self-pay | Admitting: *Deleted

## 2021-01-05 ENCOUNTER — Telehealth (HOSPITAL_COMMUNITY): Payer: Self-pay | Admitting: *Deleted

## 2021-01-05 ENCOUNTER — Encounter (HOSPITAL_COMMUNITY): Payer: Self-pay

## 2021-01-05 ENCOUNTER — Encounter: Payer: Self-pay | Admitting: Adult Health

## 2021-01-05 NOTE — Telephone Encounter (Signed)
Received vm from pt that he received a letter from our office to call for lab results. Pt asked that we send results via mychart. Results sent via mychart as requested.    [name], MD  12/16/2020  4:55 PM EDT      LFTs back to normal range.  K is low, add an extra 20 mEq daily.

## 2021-01-10 ENCOUNTER — Encounter (HOSPITAL_BASED_OUTPATIENT_CLINIC_OR_DEPARTMENT_OTHER): Payer: Self-pay | Admitting: Obstetrics and Gynecology

## 2021-01-10 ENCOUNTER — Telehealth: Payer: Self-pay | Admitting: Medical

## 2021-01-10 ENCOUNTER — Encounter (HOSPITAL_COMMUNITY): Payer: Self-pay

## 2021-01-10 ENCOUNTER — Other Ambulatory Visit: Payer: Self-pay

## 2021-01-10 ENCOUNTER — Inpatient Hospital Stay (HOSPITAL_BASED_OUTPATIENT_CLINIC_OR_DEPARTMENT_OTHER)
Admission: EM | Admit: 2021-01-10 | Discharge: 2021-01-14 | DRG: 682 | Disposition: A | Discharge status: Home Health | Payer: Medicare Other | Attending: Internal Medicine | Admitting: Internal Medicine

## 2021-01-10 ENCOUNTER — Ambulatory Visit (HOSPITAL_COMMUNITY)
Admission: RE | Admit: 2021-01-10 | Discharge: 2021-01-10 | Disposition: A | Discharge status: Home / Self Care | Payer: Medicare Other | Source: Ambulatory Visit | Attending: Cardiology | Admitting: Cardiology

## 2021-01-10 DIAGNOSIS — Z6833 Body mass index (BMI) 33.0-33.9, adult: Secondary | ICD-10-CM

## 2021-01-10 DIAGNOSIS — E861 Hypovolemia: Secondary | ICD-10-CM | POA: Diagnosis present

## 2021-01-10 DIAGNOSIS — I4892 Unspecified atrial flutter: Secondary | ICD-10-CM | POA: Diagnosis present

## 2021-01-10 DIAGNOSIS — R509 Fever, unspecified: Secondary | ICD-10-CM | POA: Diagnosis not present

## 2021-01-10 DIAGNOSIS — I428 Other cardiomyopathies: Secondary | ICD-10-CM | POA: Diagnosis present

## 2021-01-10 DIAGNOSIS — Z8249 Family history of ischemic heart disease and other diseases of the circulatory system: Secondary | ICD-10-CM

## 2021-01-10 DIAGNOSIS — I2510 Atherosclerotic heart disease of native coronary artery without angina pectoris: Secondary | ICD-10-CM | POA: Diagnosis present

## 2021-01-10 DIAGNOSIS — T502X5A Adverse effect of carbonic-anhydrase inhibitors, benzothiadiazides and other diuretics, initial encounter: Secondary | ICD-10-CM | POA: Diagnosis present

## 2021-01-10 DIAGNOSIS — I480 Paroxysmal atrial fibrillation: Secondary | ICD-10-CM | POA: Diagnosis present

## 2021-01-10 DIAGNOSIS — E876 Hypokalemia: Secondary | ICD-10-CM | POA: Diagnosis present

## 2021-01-10 DIAGNOSIS — I5043 Acute on chronic combined systolic (congestive) and diastolic (congestive) heart failure: Secondary | ICD-10-CM | POA: Diagnosis present

## 2021-01-10 DIAGNOSIS — Z95810 Presence of automatic (implantable) cardiac defibrillator: Secondary | ICD-10-CM | POA: Diagnosis present

## 2021-01-10 DIAGNOSIS — Z8719 Personal history of other diseases of the digestive system: Secondary | ICD-10-CM

## 2021-01-10 DIAGNOSIS — I5022 Chronic systolic (congestive) heart failure: Secondary | ICD-10-CM

## 2021-01-10 DIAGNOSIS — Z66 Do not resuscitate: Secondary | ICD-10-CM | POA: Diagnosis present

## 2021-01-10 DIAGNOSIS — Z79899 Other long term (current) drug therapy: Secondary | ICD-10-CM

## 2021-01-10 DIAGNOSIS — I110 Hypertensive heart disease with heart failure: Secondary | ICD-10-CM | POA: Diagnosis present

## 2021-01-10 DIAGNOSIS — Z888 Allergy status to other drugs, medicaments and biological substances status: Secondary | ICD-10-CM

## 2021-01-10 DIAGNOSIS — E119 Type 2 diabetes mellitus without complications: Secondary | ICD-10-CM

## 2021-01-10 DIAGNOSIS — Z823 Family history of stroke: Secondary | ICD-10-CM

## 2021-01-10 DIAGNOSIS — I350 Nonrheumatic aortic (valve) stenosis: Secondary | ICD-10-CM | POA: Diagnosis present

## 2021-01-10 DIAGNOSIS — D72829 Elevated white blood cell count, unspecified: Secondary | ICD-10-CM | POA: Diagnosis not present

## 2021-01-10 DIAGNOSIS — Z7901 Long term (current) use of anticoagulants: Secondary | ICD-10-CM

## 2021-01-10 DIAGNOSIS — Z841 Family history of disorders of kidney and ureter: Secondary | ICD-10-CM

## 2021-01-10 DIAGNOSIS — Y92009 Unspecified place in unspecified non-institutional (private) residence as the place of occurrence of the external cause: Secondary | ICD-10-CM

## 2021-01-10 DIAGNOSIS — G4733 Obstructive sleep apnea (adult) (pediatric): Secondary | ICD-10-CM | POA: Diagnosis present

## 2021-01-10 DIAGNOSIS — I5042 Chronic combined systolic (congestive) and diastolic (congestive) heart failure: Secondary | ICD-10-CM

## 2021-01-10 DIAGNOSIS — N179 Acute kidney failure, unspecified: Secondary | ICD-10-CM | POA: Diagnosis not present

## 2021-01-10 DIAGNOSIS — Z83438 Family history of other disorder of lipoprotein metabolism and other lipidemia: Secondary | ICD-10-CM

## 2021-01-10 DIAGNOSIS — Z825 Family history of asthma and other chronic lower respiratory diseases: Secondary | ICD-10-CM

## 2021-01-10 DIAGNOSIS — G4730 Sleep apnea, unspecified: Secondary | ICD-10-CM | POA: Diagnosis present

## 2021-01-10 DIAGNOSIS — E860 Dehydration: Secondary | ICD-10-CM | POA: Diagnosis not present

## 2021-01-10 DIAGNOSIS — Z9049 Acquired absence of other specified parts of digestive tract: Secondary | ICD-10-CM

## 2021-01-10 DIAGNOSIS — R0600 Dyspnea, unspecified: Secondary | ICD-10-CM

## 2021-01-10 DIAGNOSIS — E871 Hypo-osmolality and hyponatremia: Secondary | ICD-10-CM

## 2021-01-10 DIAGNOSIS — Z20822 Contact with and (suspected) exposure to covid-19: Secondary | ICD-10-CM | POA: Diagnosis present

## 2021-01-10 DIAGNOSIS — Z7984 Long term (current) use of oral hypoglycemic drugs: Secondary | ICD-10-CM

## 2021-01-10 DIAGNOSIS — I440 Atrioventricular block, first degree: Secondary | ICD-10-CM | POA: Diagnosis present

## 2021-01-10 DIAGNOSIS — I10 Essential (primary) hypertension: Secondary | ICD-10-CM

## 2021-01-10 LAB — COMPREHENSIVE METABOLIC PANEL
ALT: 22 U/L (ref 0–44)
AST: 24 U/L (ref 15–41)
Albumin: 4.2 g/dL (ref 3.5–5.0)
Alkaline Phosphatase: 45 U/L (ref 38–126)
Anion gap: 16 — ABNORMAL HIGH (ref 5–15)
BUN: 71 mg/dL — ABNORMAL HIGH (ref 8–23)
CO2: 34 mmol/L — ABNORMAL HIGH (ref 22–32)
Calcium: 9.7 mg/dL (ref 8.9–10.3)
Chloride: 73 mmol/L — ABNORMAL LOW (ref 98–111)
Creatinine, Ser: 1.3 mg/dL — ABNORMAL HIGH (ref 0.61–1.24)
GFR, Estimated: 57 mL/min — ABNORMAL LOW (ref >60)
Glucose, Bld: 174 mg/dL — ABNORMAL HIGH (ref 70–99)
Potassium: 2.5 mmol/L — CL (ref 3.5–5.1)
Sodium: 123 mmol/L — ABNORMAL LOW (ref 135–145)
Total Bilirubin: 0.8 mg/dL (ref 0.3–1.2)
Total Protein: 7.5 g/dL (ref 6.5–8.1)

## 2021-01-10 LAB — CBC WITH DIFFERENTIAL/PLATELET
Abs Immature Granulocytes: 0.07 10*3/uL (ref 0.00–0.07)
Basophils Absolute: 0 10*3/uL (ref 0.0–0.1)
Basophils Relative: 0 %
Eosinophils Absolute: 0.1 10*3/uL (ref 0.0–0.5)
Eosinophils Relative: 1 %
HCT: 35.6 % — ABNORMAL LOW (ref 39.0–52.0)
Hemoglobin: 12.8 g/dL — ABNORMAL LOW (ref 13.0–17.0)
Immature Granulocytes: 1 %
Lymphocytes Relative: 11 %
Lymphs Abs: 1.3 10*3/uL (ref 0.7–4.0)
MCH: 30.3 pg (ref 26.0–34.0)
MCHC: 36 g/dL (ref 30.0–36.0)
MCV: 84.4 fL (ref 80.0–100.0)
Monocytes Absolute: 1 10*3/uL (ref 0.1–1.0)
Monocytes Relative: 8 %
Neutro Abs: 9.1 10*3/uL — ABNORMAL HIGH (ref 1.7–7.7)
Neutrophils Relative %: 79 %
Platelets: 238 10*3/uL (ref 150–400)
RBC: 4.22 MIL/uL (ref 4.22–5.81)
RDW: 16.3 % — ABNORMAL HIGH (ref 11.5–15.5)
WBC: 11.5 10*3/uL — ABNORMAL HIGH (ref 4.0–10.5)
nRBC: 0 % (ref 0.0–0.2)

## 2021-01-10 LAB — BASIC METABOLIC PANEL
Anion gap: 15 (ref 5–15)
BUN: 69 mg/dL — ABNORMAL HIGH (ref 8–23)
CO2: 35 mmol/L — ABNORMAL HIGH (ref 22–32)
Calcium: 9.9 mg/dL (ref 8.9–10.3)
Chloride: 74 mmol/L — ABNORMAL LOW (ref 98–111)
Creatinine, Ser: 1.29 mg/dL — ABNORMAL HIGH (ref 0.61–1.24)
GFR, Estimated: 58 mL/min — ABNORMAL LOW (ref >60)
Glucose, Bld: 148 mg/dL — ABNORMAL HIGH (ref 70–99)
Potassium: 2.3 mmol/L — CL (ref 3.5–5.1)
Sodium: 124 mmol/L — ABNORMAL LOW (ref 135–145)

## 2021-01-10 LAB — RESP PANEL BY RT-PCR (FLU A&B, COVID) ARPGX2
Influenza A by PCR: NEGATIVE
Influenza B by PCR: NEGATIVE
SARS Coronavirus 2 by RT PCR: NEGATIVE

## 2021-01-10 LAB — MAGNESIUM: Magnesium: 2.8 mg/dL — ABNORMAL HIGH (ref 1.7–2.4)

## 2021-01-10 MED ORDER — POTASSIUM CHLORIDE 10 MEQ/100ML IV SOLN
10.0000 meq | INTRAVENOUS | Status: AC
Start: 2021-01-10 — End: 2021-01-11
  Administered 2021-01-10 – 2021-01-11 (×4): 10 meq via INTRAVENOUS
  Filled 2021-01-10 (×4): qty 100

## 2021-01-10 MED ORDER — POTASSIUM CHLORIDE CRYS ER 20 MEQ PO TBCR
40.0000 meq | EXTENDED_RELEASE_TABLET | Freq: Once | ORAL | Status: AC
  Administered 2021-01-10: 40 meq via ORAL
  Filled 2021-01-10: qty 2

## 2021-01-10 MED ORDER — LACTATED RINGERS IV BOLUS
1000.0000 mL | Freq: Once | INTRAVENOUS | Status: AC
  Administered 2021-01-10: 1000 mL via INTRAVENOUS

## 2021-01-10 NOTE — Plan of Care (Signed)
[facility] will assume care on arrival to accepting facility. Until arrival, care as per EDP. However, [facility] available 24/7 for questions and assistance.  Nursing staff please page [facility] Admits and Consults ([phone]) as soon as the patient arrives the hospital.   

## 2021-01-10 NOTE — ED Provider Notes (Signed)
[facility] EMERGENCY DEPT Provider Note   CSN: [id number] Arrival date & time: 01/10/21  1856     History Chief Complaint  Patient presents with   Abnormal Lab    Chad Wilkerson is a 75 y.o. male.  Patient is a 75 year old male with a history of CHF, diabetes, paroxysmal atrial fibrillation who is presenting today due to abnormal labs.  Patient reports that he was taking 100 mg of torsemide twice daily that he had in 20 mg tablets.  Approximately 1 week ago he got a new prescription and he continued to take them the same way but then started feeling lightheaded, dizzy and just generally fatigued.  He had not had any shortness of breath, chest pain, abdominal pain, nausea, vomiting or diarrhea.  He saw his doctor today and when they looked closer his new prescription was 100 mg torsemide tabs that he was only supposed to take 1 twice daily and instead he was taking 5 tabs twice daily so 500 mg twice daily of torsemide.  When he saw Dr. Sherlie Ban they checked labs and called him later today telling him he should go to the hospital because his potassium and sodium were both critically low.  While lying in bed he has no complaints but states every time he stands up he feels lightheaded.  He has felt a little bit off balance even with standing and is almost fallen several times this week.  The history is provided by the patient.  Abnormal Lab     Past Medical History:  Diagnosis Date   Abnormal liver function tests    Adjustment disorder    Aortic stenosis    Benign colon polyp    Chronic systolic CHF (congestive heart failure) (HCC)    Diabetes mellitus without complication (HCC)    Diverticulitis    Essential hypertension    Heart disease    Hypogonadism male    Hyponatremia    ICD (implantable cardioverter-defibrillator) in place    Mitral regurgitation    NICM (nonischemic cardiomyopathy) (HCC)    PAF (paroxysmal atrial fibrillation) (HCC)    Sleep apnea     Tricuspid regurgitation    Urine incontinence    Ventricular tachycardia (HCC)    Vitamin D deficiency     Patient Active Problem List   Diagnosis Date Noted   ICD (implantable cardioverter-defibrillator) in place 08/17/2020   NICM (nonischemic cardiomyopathy) (HCC) 07/20/2020   Acute on chronic systolic CHF (congestive heart failure), NYHA class 4 (HCC)    Aortic valve stenosis    Heart failure with reduced ejection fraction (HCC) 04/01/2020   Paroxysmal atrial fibrillation (HCC) 04/01/2020   Moderate aortic stenosis 04/01/2020   Mild mitral regurgitation 04/01/2020   Morbid obesity (HCC) 04/01/2020   Sleep apnea    Diabetes mellitus with coincident hypertension (HCC)    Diverticulitis    CHF (congestive heart failure) (HCC)    Adjustment disorder     Past Surgical History:  Procedure Laterality Date   CHOLECYSTECTOMY     RIGHT HEART CATH N/A 10/05/2020   Procedure: RIGHT HEART CATH;  Surgeon: [name], MD;  Location: [facility] INVASIVE CV LAB;  Service: Cardiovascular;  Laterality: N/A;   RIGHT/LEFT HEART CATH AND CORONARY ANGIOGRAPHY N/A 07/19/2020   Procedure: RIGHT/LEFT HEART CATH AND CORONARY ANGIOGRAPHY;  Surgeon: Kathleene Hazel, MD;  Location: [facility] INVASIVE CV LAB;  Service: Cardiovascular;  Laterality: N/A;   TONSILLECTOMY         Family History  Problem  Relation Age of Onset   Heart attack Mother    High Cholesterol Mother    Asthma Father    Kidney disease Sister    Stroke Maternal Grandfather    Heart attack Paternal Grandfather     Social History   Tobacco Use   Smoking status: Former   Smokeless tobacco: Never  Building services engineer Use: Never used  Substance Use Topics   Alcohol use: Not Currently   Drug use: Not Currently    Home Medications Prior to Admission medications   Medication Sig Start Date End Date Taking? Authorizing Provider  amiodarone (PACERONE) 200 MG tablet Take 200 mg by mouth daily.    [provider]   Arginine (L-ARGININE-500) 500 MG CAPS Take 500 mg by mouth daily.    [provider]  Ascorbic Acid (VITAMIN C) 1000 MG tablet Take 1,000 mg by mouth 2 (two) times daily.    [provider]  atorvastatin (LIPITOR) 20 MG tablet Take 1 tablet (20 mg total) by mouth daily. 11/11/20   [name], MD  B Complex Vitamins (B COMPLEX PO) Take 1 tablet by mouth daily.    [provider]  Biotin 10 MG TABS Take 10 mg by mouth daily. 10 mg - 10,000 mcg    [provider]  budesonide-formoterol (SYMBICORT) 160-4.5 MCG/ACT inhaler Inhale 2 puffs into the lungs 2 (two) times daily. 08/02/20   Martina Sinner, MD  CALCIUM PO Take 1 tablet by mouth 2 (two) times daily.    [provider]  cholecalciferol (VITAMIN D3) 25 MCG (1000 UNIT) tablet Take 1,000 Units by mouth 2 (two) times daily.    [provider]  CHROMIUM PO Take 1 tablet by mouth 2 (two) times daily.    [provider]  dapagliflozin propanediol (FARXIGA) 10 MG TABS tablet Take 1 tablet (10 mg total) by mouth daily before breakfast. 07/13/20   Philip Aspen, Limmie Patricia, MD  digoxin (LANOXIN) 0.125 MG tablet Take 1 tablet (0.125 mg total) by mouth daily. 11/29/20   [name], MD  ELIQUIS 5 MG TABS tablet Take 1 tablet (5 mg total) by mouth 2 (two) times daily. 11/24/20   Chandrasekhar, Mahesh A, MD  escitalopram (LEXAPRO) 10 MG tablet TAKE 1 TABLET(10 MG) BY MOUTH DAILY 12/15/20   Nafziger, Kandee Keen, NP  levOCARNitine (L-CARNITINE) 500 MG TABS Take 500 mg by mouth daily.    [provider]  losartan (COZAAR) 25 MG tablet Take 0.5 tablets (12.5 mg total) by mouth daily. 10/26/20   [name], MD  Magnesium 400 MG TABS Take 400 mg by mouth daily. 05/11/20   Georgie Chard D, NP  metFORMIN (GLUCOPHAGE) 500 MG tablet Take 1-3 tablets (500-1,500 mg total) by mouth See admin instructions. Take one tablet (500 mg) by mouth daily with breakfast and three tablets (1500 mg) daily  with supper 07/22/20   Leone Brand, NP  metolazone (ZAROXOLYN) 2.5 MG tablet Take 1 tablet (2.5 mg total) by mouth every Friday. 12/17/20   [name], MD  milrinone [facility]) 20 MG/100 ML SOLN infusion Inject 0.0243 mg/min into the vein continuous. 10/09/20   Edsel Petrin, DO  [facility] ULTRA test strip Use as instructed to check blood sugars twice daily 10/05/20   Nafziger, Kandee Keen, NP  potassium chloride SA (KLOR-CON) 20 MEQ tablet Take 2 tablets (40 mEq total) by mouth 2 (two) times daily. Take 2 extra tablets on metolazone days 12/16/20   [name]  S, MD  spironolactone (ALDACTONE) 25 MG tablet Take 1 tablet (25 mg total) by mouth daily. 10/26/20   Christell Constant, MD  torsemide (DEMADEX) 100 MG tablet Take 1 tablet (100 mg total) by mouth 2 (two) times daily. 12/22/20   [name], MD  vitamin E 180 MG (400 UNITS) capsule Take 400 Units by mouth daily.    [provider]  Zinc 50 MG TABS Take 50 mg by mouth daily.    [provider]  Zn-Pyg Afri-Nettle-Saw Palmet (SAW PALMETTO COMPLEX PO) Take 540 mg by mouth daily.    [provider]    Allergies    Semaglutide  Review of Systems   Review of Systems  All other systems reviewed and are negative.  Physical Exam Updated Vital Signs BP (!) 109/52   Pulse (!) 49   Temp 98.2 F (36.8 C) (Oral)   Resp 16   SpO2 92%   Physical Exam Vitals and nursing note reviewed.  Constitutional:      General: He is not in acute distress.    Appearance: Normal appearance. He is well-developed.  HENT:     Head: Normocephalic and atraumatic.     Mouth/Throat:     Mouth: Mucous membranes are dry.  Eyes:     Conjunctiva/sclera: Conjunctivae normal.     Pupils: Pupils are equal, round, and reactive to light.  Cardiovascular:     Rate and Rhythm: Normal rate and regular rhythm.     Heart sounds: No murmur heard. Pulmonary:     Effort: Pulmonary effort is normal. No respiratory distress.      Breath sounds: Normal breath sounds. No wheezing or rales.  Abdominal:     General: There is no distension.     Palpations: Abdomen is soft.     Tenderness: There is no abdominal tenderness. There is no guarding or rebound.  Musculoskeletal:        General: No tenderness. Normal range of motion.     Cervical back: Normal range of motion and neck supple.     Right lower leg: No edema.     Left lower leg: No edema.  Skin:    General: Skin is warm and dry.     Findings: No erythema or rash.  Neurological:     Mental Status: He is alert and oriented to person, place, and time. Mental status is at baseline.  Psychiatric:        Mood and Affect: Mood normal.        Behavior: Behavior normal.    ED Results / Procedures / Treatments   Labs (all labs ordered are listed, but only abnormal results are displayed) Labs Reviewed  CBC WITH DIFFERENTIAL/PLATELET - Abnormal; Notable for the following components:      Result Value   WBC 11.5 (*)    Hemoglobin 12.8 (*)    HCT 35.6 (*)    RDW 16.3 (*)    Neutro Abs 9.1 (*)    All other components within normal limits  COMPREHENSIVE METABOLIC PANEL - Abnormal; Notable for the following components:   Sodium 123 (*)    Potassium 2.5 (*)    Chloride 73 (*)    CO2 34 (*)    Glucose, Bld 174 (*)    BUN 71 (*)    Creatinine, Ser 1.30 (*)    GFR, Estimated 57 (*)    Anion gap 16 (*)    All other components within normal limits  MAGNESIUM -  Abnormal; Notable for the following components:   Magnesium 2.8 (*)    All other components within normal limits    EKG None  Radiology No results found.  Procedures Procedures   Medications Ordered in ED Medications  lactated ringers bolus 1,000 mL (1,000 mLs Intravenous New Bag/Given 01/10/21 1955)    ED Course  I have reviewed the triage vital signs and the nursing notes.  Pertinent labs & imaging results that were available during my care of the patient were reviewed by me and considered in  my medical decision making (see chart for details).    MDM Rules/Calculators/A&P                           Patient presenting today due to hypokalemia and hyponatremia.  This is most likely in the setting of taking 500 mg of torsemide twice daily due to a misunderstanding on the new medication he received.  Patient has no complaints while laying down but does feel dizzy with standing.  Patient noted to be hypotensive with standing but blood pressure improved with lying down.  He is in no acute distress at this time but from the office potassium was 2.3 and sodium was 124 with new AKI with creatinine of 1.4.  We will recheck labs to ensure that this is correct but patient will most likely need electrolyte replacement.  He was given a bolus of IV fluid given his low blood pressure.  CBC without acute findings, CMP and magnesium are pending.  8:34 PM Patient's EKG without acute changes.  Magnesium today is  Elevated at 2.8, CMP with potassium of 2.5, sodium of 123, creatinine of 1.3 and anion gap of 16.  This is most likely all from overdiuresis.  We will continue IV fluids.  Patient given potassium and will admit for further care.  MDM   Amount and/or Complexity of Data Reviewed Clinical lab tests: ordered and reviewed Tests in the medicine section of CPT: ordered and reviewed Decide to obtain previous medical records or to obtain history from someone other than the patient: yes Obtain history from someone other than the patient: yes Review and summarize past medical records: yes Discuss the patient with other providers: yes Independent visualization of images, tracings, or specimens: yes  Risk of Complications, Morbidity, and/or Mortality Presenting problems: high Diagnostic procedures: high Management options: high  Patient Progress Patient progress: stable  CRITICAL CARE Performed by: Haylee Mcanany Total critical care time: 30 minutes Critical care time was exclusive of  separately billable procedures and treating other patients. Critical care was necessary to treat or prevent imminent or life-threatening deterioration. Critical care was time spent personally by me on the following activities: development of treatment plan with patient and/or surrogate as well as nursing, discussions with consultants, evaluation of patient's response to treatment, examination of patient, obtaining history from patient or surrogate, ordering and performing treatments and interventions, ordering and review of laboratory studies, ordering and review of radiographic studies, pulse oximetry and re-evaluation of patient's condition.  Final Clinical Impression(s) / ED Diagnoses Final diagnoses:  Hypokalemia  Hyponatremia  AKI (acute kidney injury) (HCC)  Dehydration    Rx / DC Orders ED Discharge Orders     None        Gwyneth Sprout, MD 01/10/21 2038

## 2021-01-10 NOTE — Telephone Encounter (Signed)
[phone] (H) no answer x 2 [phone] [facility]

## 2021-01-10 NOTE — ED Triage Notes (Signed)
Patinet has been over taking his fluid medication. Patient has been taking x5 the amount he should due to a dose change he did not understand resulting in low K+ and Na+

## 2021-01-10 NOTE — Telephone Encounter (Signed)
   Notified by lab of critical result. Patient presented for STAT BMET today after taking 5 tablets of torsemide 100mg  by accident, thinking they were previously prescribed 20mg  tablets. Lab results revealed Na 124, K 2.3, Cr 1.29 (baseline 0.9). He reports ongoing dizziness at this time. Recommended ED evaluation given dangerously low potassium. Patient to go to [facility] ED for further evaluation/management of his hyponatremia, hypokalemia, and mild AKI. Patient was appreciative of the call and in agreement with the plan. [facility] notified of impending patient arrival.   , [facility] 01/10/21; 6:36 PM

## 2021-01-11 ENCOUNTER — Encounter (HOSPITAL_COMMUNITY): Payer: Self-pay | Admitting: Cardiology

## 2021-01-11 DIAGNOSIS — I350 Nonrheumatic aortic (valve) stenosis: Secondary | ICD-10-CM | POA: Diagnosis present

## 2021-01-11 DIAGNOSIS — Z7901 Long term (current) use of anticoagulants: Secondary | ICD-10-CM | POA: Diagnosis not present

## 2021-01-11 DIAGNOSIS — E871 Hypo-osmolality and hyponatremia: Secondary | ICD-10-CM

## 2021-01-11 DIAGNOSIS — I5042 Chronic combined systolic (congestive) and diastolic (congestive) heart failure: Secondary | ICD-10-CM | POA: Diagnosis not present

## 2021-01-11 DIAGNOSIS — G4733 Obstructive sleep apnea (adult) (pediatric): Secondary | ICD-10-CM | POA: Diagnosis present

## 2021-01-11 DIAGNOSIS — I480 Paroxysmal atrial fibrillation: Secondary | ICD-10-CM | POA: Diagnosis present

## 2021-01-11 DIAGNOSIS — E861 Hypovolemia: Secondary | ICD-10-CM | POA: Diagnosis present

## 2021-01-11 DIAGNOSIS — Z823 Family history of stroke: Secondary | ICD-10-CM | POA: Diagnosis not present

## 2021-01-11 DIAGNOSIS — D72829 Elevated white blood cell count, unspecified: Secondary | ICD-10-CM | POA: Diagnosis not present

## 2021-01-11 DIAGNOSIS — E876 Hypokalemia: Secondary | ICD-10-CM | POA: Diagnosis present

## 2021-01-11 DIAGNOSIS — T502X5A Adverse effect of carbonic-anhydrase inhibitors, benzothiadiazides and other diuretics, initial encounter: Secondary | ICD-10-CM | POA: Diagnosis present

## 2021-01-11 DIAGNOSIS — I428 Other cardiomyopathies: Secondary | ICD-10-CM | POA: Diagnosis present

## 2021-01-11 DIAGNOSIS — I110 Hypertensive heart disease with heart failure: Secondary | ICD-10-CM | POA: Diagnosis present

## 2021-01-11 DIAGNOSIS — E860 Dehydration: Secondary | ICD-10-CM | POA: Diagnosis present

## 2021-01-11 DIAGNOSIS — E119 Type 2 diabetes mellitus without complications: Secondary | ICD-10-CM | POA: Diagnosis present

## 2021-01-11 DIAGNOSIS — I5043 Acute on chronic combined systolic (congestive) and diastolic (congestive) heart failure: Secondary | ICD-10-CM

## 2021-01-11 DIAGNOSIS — N179 Acute kidney failure, unspecified: Secondary | ICD-10-CM | POA: Diagnosis present

## 2021-01-11 DIAGNOSIS — Z20822 Contact with and (suspected) exposure to covid-19: Secondary | ICD-10-CM | POA: Diagnosis present

## 2021-01-11 DIAGNOSIS — Z79899 Other long term (current) drug therapy: Secondary | ICD-10-CM | POA: Diagnosis not present

## 2021-01-11 DIAGNOSIS — Z66 Do not resuscitate: Secondary | ICD-10-CM | POA: Diagnosis present

## 2021-01-11 DIAGNOSIS — I2510 Atherosclerotic heart disease of native coronary artery without angina pectoris: Secondary | ICD-10-CM | POA: Diagnosis present

## 2021-01-11 DIAGNOSIS — Z6833 Body mass index (BMI) 33.0-33.9, adult: Secondary | ICD-10-CM | POA: Diagnosis not present

## 2021-01-11 DIAGNOSIS — Y92009 Unspecified place in unspecified non-institutional (private) residence as the place of occurrence of the external cause: Secondary | ICD-10-CM | POA: Diagnosis not present

## 2021-01-11 DIAGNOSIS — I4892 Unspecified atrial flutter: Secondary | ICD-10-CM | POA: Diagnosis present

## 2021-01-11 DIAGNOSIS — I440 Atrioventricular block, first degree: Secondary | ICD-10-CM | POA: Diagnosis present

## 2021-01-11 LAB — BASIC METABOLIC PANEL
Anion gap: 16 — ABNORMAL HIGH (ref 5–15)
Anion gap: 10 (ref 5–15)
BUN: 61 mg/dL — ABNORMAL HIGH (ref 8–23)
BUN: 43 mg/dL — ABNORMAL HIGH (ref 8–23)
CO2: 31 mmol/L (ref 22–32)
CO2: 33 mmol/L — ABNORMAL HIGH (ref 22–32)
Calcium: 9.7 mg/dL (ref 8.9–10.3)
Calcium: 8.9 mg/dL (ref 8.9–10.3)
Chloride: 79 mmol/L — ABNORMAL LOW (ref 98–111)
Chloride: 83 mmol/L — ABNORMAL LOW (ref 98–111)
Creatinine, Ser: 1.22 mg/dL (ref 0.61–1.24)
Creatinine, Ser: 1.12 mg/dL (ref 0.61–1.24)
GFR, Estimated: >60 mL/min (ref >60)
GFR, Estimated: >60 mL/min (ref >60)
Glucose, Bld: 154 mg/dL — ABNORMAL HIGH (ref 70–99)
Glucose, Bld: 171 mg/dL — ABNORMAL HIGH (ref 70–99)
Potassium: 2.3 mmol/L — CL (ref 3.5–5.1)
Potassium: 2.5 mmol/L — CL (ref 3.5–5.1)
Sodium: 126 mmol/L — ABNORMAL LOW (ref 135–145)
Sodium: 126 mmol/L — ABNORMAL LOW (ref 135–145)

## 2021-01-11 LAB — COOXEMETRY PANEL
Carboxyhemoglobin: 1.6 % — ABNORMAL HIGH (ref 0.5–1.5)
Methemoglobin: 1.1 % (ref 0.0–1.5)
O2 Saturation: 53.5 %
Total hemoglobin: 11.9 g/dL — ABNORMAL LOW (ref 12.0–16.0)

## 2021-01-11 LAB — OSMOLALITY, URINE: Osmolality, Ur: 321 mOsm/kg (ref 300–900)

## 2021-01-11 LAB — DIGOXIN LEVEL: Digoxin Level: 0.6 ng/mL — ABNORMAL LOW (ref 0.8–2.0)

## 2021-01-11 LAB — CREATININE, URINE, RANDOM: Creatinine, Urine: 31.78 mg/dL

## 2021-01-11 LAB — GLUCOSE, CAPILLARY
Glucose-Capillary: 181 mg/dL — ABNORMAL HIGH (ref 70–99)
Glucose-Capillary: 210 mg/dL — ABNORMAL HIGH (ref 70–99)
Glucose-Capillary: 159 mg/dL — ABNORMAL HIGH (ref 70–99)
Glucose-Capillary: 196 mg/dL — ABNORMAL HIGH (ref 70–99)

## 2021-01-11 LAB — HEMOGLOBIN A1C
Hgb A1c MFr Bld: 6.7 % — ABNORMAL HIGH (ref 4.8–5.6)
Mean Plasma Glucose: 145.59 mg/dL

## 2021-01-11 MED ORDER — INSULIN ASPART 100 UNIT/ML IJ SOLN: 0.0000 [IU] | Freq: Every day | INTRAMUSCULAR | Status: DC

## 2021-01-11 MED ORDER — POTASSIUM CHLORIDE CRYS ER 20 MEQ PO TBCR
40.0000 meq | EXTENDED_RELEASE_TABLET | Freq: Three times a day (TID) | ORAL | Status: DC
  Administered 2021-01-11: 40 meq via ORAL
  Filled 2021-01-11: qty 2

## 2021-01-11 MED ORDER — APIXABAN 5 MG PO TABS
5.0000 mg | ORAL_TABLET | Freq: Two times a day (BID) | ORAL | Status: DC
  Administered 2021-01-11 – 2021-01-14 (×7): 5 mg via ORAL
  Filled 2021-01-11 (×7): qty 1

## 2021-01-11 MED ORDER — ATORVASTATIN CALCIUM 10 MG PO TABS
20.0000 mg | ORAL_TABLET | Freq: Every day | ORAL | Status: DC
  Administered 2021-01-11 – 2021-01-14 (×4): 20 mg via ORAL
  Filled 2021-01-11 (×4): qty 2

## 2021-01-11 MED ORDER — ESCITALOPRAM OXALATE 10 MG PO TABS
10.0000 mg | ORAL_TABLET | Freq: Every day | ORAL | Status: DC
  Administered 2021-01-11 – 2021-01-14 (×4): 10 mg via ORAL
  Filled 2021-01-11 (×4): qty 1

## 2021-01-11 MED ORDER — CHLORHEXIDINE GLUCONATE CLOTH 2 % EX PADS
6.0000 | MEDICATED_PAD | Freq: Every day | CUTANEOUS | Status: DC
  Administered 2021-01-11 – 2021-01-13 (×3): 6 via TOPICAL

## 2021-01-11 MED ORDER — AMIODARONE HCL 200 MG PO TABS
200.0000 mg | ORAL_TABLET | Freq: Every day | ORAL | Status: DC
  Administered 2021-01-11 – 2021-01-14 (×4): 200 mg via ORAL
  Filled 2021-01-11 (×4): qty 1

## 2021-01-11 MED ORDER — DIGOXIN 125 MCG PO TABS
0.1250 mg | ORAL_TABLET | Freq: Every day | ORAL | Status: DC
  Administered 2021-01-11 – 2021-01-14 (×4): 0.125 mg via ORAL
  Filled 2021-01-11 (×4): qty 1

## 2021-01-11 MED ORDER — POTASSIUM CHLORIDE CRYS ER 20 MEQ PO TBCR
40.0000 meq | EXTENDED_RELEASE_TABLET | ORAL | Status: AC
  Administered 2021-01-11 (×2): 40 meq via ORAL
  Filled 2021-01-11 (×2): qty 2

## 2021-01-11 MED ORDER — MOMETASONE FURO-FORMOTEROL FUM 200-5 MCG/ACT IN AERO
2.0000 | INHALATION_SPRAY | Freq: Two times a day (BID) | RESPIRATORY_TRACT | Status: DC
Start: 2021-01-11 — End: 2021-01-14
  Administered 2021-01-11 – 2021-01-14 (×7): 2 via RESPIRATORY_TRACT
  Filled 2021-01-11 (×2): qty 8.8

## 2021-01-11 MED ORDER — SODIUM CHLORIDE 0.9% FLUSH
3.0000 mL | Freq: Two times a day (BID) | INTRAVENOUS | Status: DC
  Administered 2021-01-11 – 2021-01-14 (×5): 3 mL via INTRAVENOUS

## 2021-01-11 MED ORDER — MILRINONE LACTATE IN DEXTROSE 20-5 MG/100ML-% IV SOLN
0.2500 ug/kg/min | INTRAVENOUS | Status: DC
  Administered 2021-01-11 – 2021-01-14 (×6): 0.25 ug/kg/min via INTRAVENOUS
  Filled 2021-01-11 (×7): qty 100

## 2021-01-11 MED ORDER — INSULIN ASPART 100 UNIT/ML IJ SOLN
0.0000 [IU] | Freq: Three times a day (TID) | INTRAMUSCULAR | Status: DC
  Administered 2021-01-11: 3 [IU] via SUBCUTANEOUS
  Administered 2021-01-11 (×2): 2 [IU] via SUBCUTANEOUS
  Administered 2021-01-12: 3 [IU] via SUBCUTANEOUS
  Administered 2021-01-12 – 2021-01-14 (×6): 2 [IU] via SUBCUTANEOUS
  Administered 2021-01-14: 3 [IU] via SUBCUTANEOUS

## 2021-01-11 MED ORDER — ACETAMINOPHEN 325 MG PO TABS
650.0000 mg | ORAL_TABLET | Freq: Four times a day (QID) | ORAL | Status: DC | PRN
  Administered 2021-01-13: 650 mg via ORAL
  Filled 2021-01-11: qty 2

## 2021-01-11 MED ORDER — AMIODARONE HCL 200 MG PO TABS: 200.0000 mg | ORAL_TABLET | Freq: Every day | ORAL | Status: DC

## 2021-01-11 MED ORDER — SODIUM CHLORIDE 0.9 % IV SOLN: INTRAVENOUS | Status: AC

## 2021-01-11 MED ORDER — POTASSIUM CHLORIDE CRYS ER 20 MEQ PO TBCR
40.0000 meq | EXTENDED_RELEASE_TABLET | ORAL | Status: AC
  Administered 2021-01-11 – 2021-01-12 (×3): 40 meq via ORAL
  Filled 2021-01-11 (×3): qty 2

## 2021-01-11 MED ORDER — ACETAMINOPHEN 650 MG RE SUPP: 650.0000 mg | Freq: Four times a day (QID) | RECTAL | Status: DC | PRN

## 2021-01-11 NOTE — Progress Notes (Signed)
RN consulted phlebotomy regarding cooxemetry panel. Lab stating RT is to draw panel. RN consulted RT, who states lab is to draw. This RN will consult dayshift RN regarding cooxemetry panel. Lab due at [phone]

## 2021-01-11 NOTE — Assessment & Plan Note (Signed)
-   as noted above, etiology presumed hypovolemic 2/2 overdiuresis - for thoroughness, checking serum osmo, urine osmo; FeUrea (due to diuretic use) - asymptomatic, some possible AMS on admission; currently AOx4 - BMP in am

## 2021-01-11 NOTE — Assessment & Plan Note (Signed)
-   EF less than 20% - ICD in place

## 2021-01-11 NOTE — Assessment & Plan Note (Signed)
-   Continue Eliquis - Continue amiodarone

## 2021-01-11 NOTE — Assessment & Plan Note (Signed)
-   due to overdiuresis - repleting  - check Mg

## 2021-01-11 NOTE — Assessment & Plan Note (Addendum)
-   on milrinone at home; follows with cardiology - cardiology consulted on admission - check Co-ox daily and/or more/less frequent as per cardiology  -Continue digoxin, check dig level - Hold losartan in setting of hypotension and AKI -Hold spironolactone in setting of AKI.  Will resume when able

## 2021-01-11 NOTE — Assessment & Plan Note (Signed)
-   contributes to mortality and morbidity

## 2021-01-11 NOTE — H&P (Signed)
History and Physical    Chad Wilkerson  ZOX:[id number]  DOB: 10/15/1945  DOA: 01/10/2021  PCP: Shirline FreesNafziger, Cory, NP Patient coming from: home  Chief Complaint: "took too much torsemide"  HPI:  Chad Wilkerson is a 75 yo male with PMH combined CHF (EF<20%, GrIII DD), PAF, AS, hypertension, sleep apnea, morbid obesity who presented to the hospital with abnormal labs outpatient. He typically is on torsemide 100 mg twice daily and had been using 20 mg tablets (therefore taking 5 tablets twice daily at home previously).  He ran out and a prescription was refilled with 100 mg tablets and he stated that he did not look at the bottle and was taking 5 tablets twice daily for approximately 10 days "until the bottle was empty". Upon noticing his mistake, he contacted cardiology and underwent stat labs.  He was found to be hyponatremic, hypokalemic, and creatinine was elevated above baseline.  He was referred to the ER for further evaluation and work-up.  He again has the aforementioned labs on work-up in the ER.  He was started on IV fluids and admitted for further lab work-up and monitoring.  Heart failure medications continued and cardiology also consulted on admission.  He denied any shortness of breath, chest pain, palpitations, swelling.   I have personally briefly reviewed patient's old medical records in [facility] and discussed patient with the ER provider when appropriate/indicated.  Assessment/Plan: * AKI (acute kidney injury) (HCC) - baseline creatinine ~ 0.9 - patient presents with increase in creat >0.3 mg/dL above baseline presumed to have occurred within past 7 days PTA - etiology considered overdiuresis due to accidental extra torsemide (patient taking 500 mg BID for approx 10 days due to new script from prior strength of 20 mg tabs and he was taking 5 tabs BID at that time) - low rate IVF (NS@50 ) in setting of severe CHF; currently no SOB  - repeat BMP in am  Hyponatremia -  as noted above, etiology presumed hypovolemic 2/2 overdiuresis - for thoroughness, checking serum osmo, urine osmo; FeUrea (due to diuretic use) - asymptomatic, some possible AMS on admission; currently AOx4 - BMP in am  Hypokalemia - due to overdiuresis - repleting  - check Mg  Chronic combined systolic and diastolic CHF (congestive heart failure) (HCC) - on milrinone at home; follows with cardiology - cardiology consulted on admission - check Co-ox daily and/or more/less frequent as per cardiology  -Continue digoxin, check dig level - Hold losartan in setting of hypotension and AKI -Hold spironolactone in setting of AKI.  Will resume when able  ICD (implantable cardioverter-defibrillator) in place - EF less than 20% - ICD in place  Morbid obesity (HCC) - contributes to mortality and morbidity   Paroxysmal atrial fibrillation (HCC) - Continue Eliquis - Continue amiodarone  Diabetes mellitus without complication (HCC) - A1c 6.7% on admission - Continue SSI and CBG monitoring  Essential hypertension - Hypotensive on admission due to overdiuresis - Holding antihypertensives - Resume as able  Sleep apnea - continue CPAP     Code Status:     Code Status: DNR  DVT Prophylaxis:   SCDs Start: 01/11/21 0410 apixaban (ELIQUIS) tablet 5 mg   Anticipated disposition is to: Home  History: Past Medical History:  Diagnosis Date   Abnormal liver function tests    Adjustment disorder    Aortic stenosis    Benign colon polyp    Chronic systolic CHF (congestive heart failure) (HCC)    Diabetes mellitus without complication (  HCC)    Diverticulitis    Essential hypertension    Heart disease    Hypogonadism male    Hyponatremia    ICD (implantable cardioverter-defibrillator) in place    Mitral regurgitation    NICM (nonischemic cardiomyopathy) (HCC)    PAF (paroxysmal atrial fibrillation) (HCC)    Sleep apnea    Tricuspid regurgitation    Urine incontinence     Ventricular tachycardia (HCC)    Vitamin D deficiency     Past Surgical History:  Procedure Laterality Date   CHOLECYSTECTOMY     RIGHT HEART CATH N/A 10/05/2020   Procedure: RIGHT HEART CATH;  Surgeon: [name], MD;  Location: [facility] INVASIVE CV LAB;  Service: Cardiovascular;  Laterality: N/A;   RIGHT/LEFT HEART CATH AND CORONARY ANGIOGRAPHY N/A 07/19/2020   Procedure: RIGHT/LEFT HEART CATH AND CORONARY ANGIOGRAPHY;  Surgeon: Kathleene Hazel, MD;  Location: [facility] INVASIVE CV LAB;  Service: Cardiovascular;  Laterality: N/A;   TONSILLECTOMY       reports that he has quit smoking. He has never used smokeless tobacco. He reports previous alcohol use. He reports previous drug use.  Allergies  Allergen Reactions   Semaglutide Other (See Comments)    Nausea ,vomiting ,back pain    Family History  Problem Relation Age of Onset   Heart attack Mother    High Cholesterol Mother    Asthma Father    Kidney disease Sister    Stroke Maternal Grandfather    Heart attack Paternal Grandfather    Home Medications: Prior to Admission medications   Medication Sig Start Date End Date Taking? Authorizing Provider  acetaminophen (TYLENOL) 500 MG tablet Take 1,000 mg by mouth every 6 (six) hours as needed for headache or mild pain.   Yes [provider]  amiodarone (PACERONE) 200 MG tablet Take 200 mg by mouth daily.   Yes [provider]  Arginine (L-ARGININE-500) 500 MG CAPS Take 500 mg by mouth daily.   Yes [provider]  Ascorbic Acid (VITAMIN C) 1000 MG tablet Take 1,000 mg by mouth 2 (two) times daily.   Yes [provider]  atorvastatin (LIPITOR) 20 MG tablet Take 1 tablet (20 mg total) by mouth daily. 11/11/20  Yes [name], MD  B Complex Vitamins (B COMPLEX PO) Take 1 tablet by mouth daily.   Yes [provider]  Biotin 10 MG TABS Take 10 mg by mouth daily. 10 mg - 10,000 mcg   Yes [provider]  budesonide-formoterol  (SYMBICORT) 160-4.5 MCG/ACT inhaler Inhale 2 puffs into the lungs 2 (two) times daily. 08/02/20  Yes Martina Sinner, MD  CALCIUM PO Take 1 tablet by mouth 2 (two) times daily.   Yes [provider]  cholecalciferol (VITAMIN D3) 25 MCG (1000 UNIT) tablet Take 1,000 Units by mouth 2 (two) times daily.   Yes [provider]  CHROMIUM PO Take 1 tablet by mouth 2 (two) times daily.   Yes [provider]  dapagliflozin propanediol (FARXIGA) 10 MG TABS tablet Take 1 tablet (10 mg total) by mouth daily before breakfast. 07/13/20  Yes Philip Aspen, Limmie Patricia, MD  digoxin (LANOXIN) 0.125 MG tablet Take 1 tablet (0.125 mg total) by mouth daily. 11/29/20  Yes [name], MD  ELIQUIS 5 MG TABS tablet Take 1 tablet (5 mg total) by mouth 2 (two) times daily. 11/24/20  Yes Chandrasekhar, Mahesh A, MD  escitalopram (LEXAPRO) 10 MG tablet TAKE 1 TABLET(10 MG) BY MOUTH DAILY  Patient taking differently: Take 10 mg by mouth daily. 12/15/20  Yes Nafziger, Kandee Keen, NP  levOCARNitine (L-CARNITINE) 500 MG TABS Take 500 mg by mouth daily.   Yes [provider]  losartan (COZAAR) 25 MG tablet Take 0.5 tablets (12.5 mg total) by mouth daily. 10/26/20  Yes [name], MD  Magnesium 400 MG TABS Take 400 mg by mouth daily. 05/11/20  Yes Georgie Chard D, NP  metFORMIN (GLUCOPHAGE) 500 MG tablet Take 1-3 tablets (500-1,500 mg total) by mouth See admin instructions. Take one tablet (500 mg) by mouth daily with breakfast and three tablets (1500 mg) daily with supper 07/22/20  Yes Leone Brand, NP  metolazone (ZAROXOLYN) 2.5 MG tablet Take 1 tablet (2.5 mg total) by mouth every Friday. 12/17/20  Yes [name], MD  milrinone [facility]) 20 MG/100 ML SOLN infusion Inject 0.0243 mg/min into the vein continuous. 10/09/20  Yes Mikhail, Nita Sells, DO  potassium chloride SA (KLOR-CON) 20 MEQ tablet Take 2 tablets (40 mEq total) by mouth 2 (two) times daily. Take 2 extra tablets on metolazone  days Patient taking differently: Take 40-60 mEq by mouth 2 (two) times daily. 40mg  in the morning and 60mg  at night. Take 2 extra tablets on metolazone days 12/16/20  Yes [name], MD  spironolactone (ALDACTONE) 25 MG tablet Take 1 tablet (25 mg total) by mouth daily. 10/26/20  Yes Chandrasekhar, Mahesh A, MD  torsemide (DEMADEX) 100 MG tablet Take 1 tablet (100 mg total) by mouth 2 (two) times daily. 12/22/20  Yes [name], MD  vitamin E 180 MG (400 UNITS) capsule Take 400 Units by mouth daily.   Yes [provider]  Zinc 50 MG TABS Take 50 mg by mouth daily.   Yes [provider]  Zn-Pyg Afri-Nettle-Saw Palmet (SAW PALMETTO COMPLEX PO) Take 540 mg by mouth daily.   Yes [provider]  [facility] ULTRA test strip Use as instructed to check blood sugars twice daily 10/05/20   [name], NP    Review of Systems:  Pertinent items noted in HPI and remainder of comprehensive ROS otherwise negative.  Physical Exam: Vitals:   01/11/21 0230 01/11/21 0339 01/11/21 0755 01/11/21 0758  BP: (!) 93/57 (!) 89/53 (!) 98/55   Pulse: 68 70 71   Resp:  16 19   Temp:  97.7 F (36.5 C) 97.7 F (36.5 C)   TempSrc:  Oral Oral   SpO2: 94% 94% 96% 94%   General appearance: alert, cooperative, no distress, and slowed mentation Head: Normocephalic, without obvious abnormality, atraumatic Eyes:  EOMI Lungs: clear to auscultation bilaterally Heart: regular rate and rhythm, S1, S2 normal, and 3/6 HSM best heard USB Abdomen: normal findings: bowel sounds normal and soft, non-tender Extremities:  no edema Skin:  warm, dry, intact Neurologic: Grossly normal  Labs on Admission:  I have personally reviewed following labs and imaging studies Results for orders placed or performed during the hospital encounter of 01/10/21 (from the past 24 hour(s))  CBC with Differential/Platelet     Status: Abnormal   Collection Time: 01/10/21  7:51 PM  Result Value Ref Range   WBC  11.5 (H) 4.0 - 10.5 K/uL   RBC 4.22 4.22 - 5.81 MIL/uL   Hemoglobin 12.8 (L) 13.0 - 17.0 g/dL   HCT 46.8 (L) 03.2 - 12.2 %   MCV 84.4 80.0 - 100.0 fL   MCH 30.3 26.0 - 34.0 pg   MCHC 36.0 30.0 - 36.0 g/dL   RDW  16.3 (H) 11.5 - 15.5 %   Platelets 238 150 - 400 K/uL   nRBC 0.0 0.0 - 0.2 %   Neutrophils Relative % 79 %   Neutro Abs 9.1 (H) 1.7 - 7.7 K/uL   Lymphocytes Relative 11 %   Lymphs Abs 1.3 0.7 - 4.0 K/uL   Monocytes Relative 8 %   Monocytes Absolute 1.0 0.1 - 1.0 K/uL   Eosinophils Relative 1 %   Eosinophils Absolute 0.1 0.0 - 0.5 K/uL   Basophils Relative 0 %   Basophils Absolute 0.0 0.0 - 0.1 K/uL   Immature Granulocytes 1 %   Abs Immature Granulocytes 0.07 0.00 - 0.07 K/uL  Comprehensive metabolic panel     Status: Abnormal   Collection Time: 01/10/21  7:51 PM  Result Value Ref Range   Sodium 123 (L) 135 - 145 mmol/L   Potassium 2.5 (LL) 3.5 - 5.1 mmol/L   Chloride 73 (L) 98 - 111 mmol/L   CO2 34 (H) 22 - 32 mmol/L   Glucose, Bld 174 (H) 70 - 99 mg/dL   BUN 71 (H) 8 - 23 mg/dL   Creatinine, Ser 7.90 (H) 0.61 - 1.24 mg/dL   Calcium 9.7 8.9 - 24.0 mg/dL   Total Protein 7.5 6.5 - 8.1 g/dL   Albumin 4.2 3.5 - 5.0 g/dL   AST 24 15 - 41 U/L   ALT 22 0 - 44 U/L   Alkaline Phosphatase 45 38 - 126 U/L   Total Bilirubin 0.8 0.3 - 1.2 mg/dL   GFR, Estimated 57 (L) >60 mL/min   Anion gap 16 (H) 5 - 15  Magnesium     Status: Abnormal   Collection Time: 01/10/21  7:51 PM  Result Value Ref Range   Magnesium 2.8 (H) 1.7 - 2.4 mg/dL  Resp Panel by RT-PCR (Flu A&B, Covid) Nasopharyngeal Swab     Status: None   Collection Time: 01/10/21  8:48 PM   Specimen: Nasopharyngeal Swab; Nasopharyngeal(NP) swabs in vial transport medium  Result Value Ref Range   SARS Coronavirus 2 by RT PCR NEGATIVE NEGATIVE   Influenza A by PCR NEGATIVE NEGATIVE   Influenza B by PCR NEGATIVE NEGATIVE  Hemoglobin A1c     Status: Abnormal   Collection Time: 01/11/21  4:40 AM  Result Value Ref Range    Hgb A1c MFr Bld 6.7 (H) 4.8 - 5.6 %   Mean Plasma Glucose 145.59 mg/dL  Basic metabolic panel     Status: Abnormal   Collection Time: 01/11/21  4:49 AM  Result Value Ref Range   Sodium 126 (L) 135 - 145 mmol/L   Potassium 2.3 (LL) 3.5 - 5.1 mmol/L   Chloride 79 (L) 98 - 111 mmol/L   CO2 31 22 - 32 mmol/L   Glucose, Bld 154 (H) 70 - 99 mg/dL   BUN 61 (H) 8 - 23 mg/dL   Creatinine, Ser 9.73 0.61 - 1.24 mg/dL   Calcium 9.7 8.9 - 53.2 mg/dL   GFR, Estimated >99 >24 mL/min   Anion gap 16 (H) 5 - 15  Glucose, capillary     Status: Abnormal   Collection Time: 01/11/21  7:57 AM  Result Value Ref Range   Glucose-Capillary 181 (H) 70 - 99 mg/dL     Radiological Exams on Admission: No results found. No orders to display    Consults called:  Cardiology    EKG: Independently reviewed. Motion artifact, but aflutter with 4:1 conduction block   Lewie Chamber, MD [facility] 01/11/2021, 11:02 AM

## 2021-01-11 NOTE — Progress Notes (Signed)
Report called and given to RN on [address], pt transferred on telemetry, milrinone drip, NS drip. No distress noted.

## 2021-01-11 NOTE — Progress Notes (Signed)
Pt placed himself on his home CPAP machine. RT filled humidification chamber w/sterile water for pt and made equipment accessible and ready for pt use. RT will continue to monitor.

## 2021-01-11 NOTE — Assessment & Plan Note (Signed)
-   A1c 6.7% on admission - Continue SSI and CBG monitoring

## 2021-01-11 NOTE — TOC Initial Note (Signed)
Transition of Care [facility]) - Initial/Assessment Note    Patient Details  Name: Chad Wilkerson MRN: [id number] Date of Birth: Apr 15, 1946  Transition of Care [facility]) CM/SW Contact:    Elliot Cousin, RN Phone Number: [phone] 01/11/2021, 3:59 PM  Clinical Narrative:                 TOC CM spoke to pt and states he is active with [facility] and [facility] for [facility] IV Milrinone. States his SO, Chad Joiner helps him in the home. Contacted [employer] rep, Jeri Modena RN and [facility] rep, Pearson Grippe to make aware. They will continue to follow pt while in hospital and resume medication in home setting if dc home with IV Milrinone. Will need a resumption of care order for [facility] RN with F2F at dc.   Expected Discharge Plan: Home w Home Health Services Barriers to Discharge: Continued Medical Work up   Patient Goals and CMS Choice Patient states their goals for this hospitalization and ongoing recovery are:: wants to remain in his home CMS Medicare.gov Compare Post Acute Care list provided to:: Patient Choice offered to / list presented to : Patient  Expected Discharge Plan and Services Expected Discharge Plan: Home w Home Health Services In-house Referral: Clinical Social Work Discharge Planning Services: CM Consult Post Acute Care Choice: Home Health Living arrangements for the past 2 months: Single Family Home                           HH Arranged: RN HH Agency: [facility] Date HH Agency Contacted: 01/11/21 Time HH Agency Contacted: 1557 Representative spoke with at [facility] Agency: Jeri Modena RN, Werner Lean  Prior Living Arrangements/Services Living arrangements for the past 2 months: Single Family Home Lives with:: Domestic Partner Patient language and need for interpreter reviewed:: Yes Do you feel safe going back to the place where you live?: Yes      Need for Family Participation in Patient Care: Yes (Comment) Care giver support  system in place?: Yes (comment) Current home services: DME (rolling walker) Criminal Activity/Legal Involvement Pertinent to Current Situation/Hospitalization: No - Comment as needed  Activities of Daily Living      Permission Sought/Granted Permission sought to share information with : Case Manager, PCP, Family Supports Permission granted to share information with : Yes, Verbal Permission Granted  Share Information with NAME: Chad Wilkerson  Permission granted to share info w AGENCY: Home Health  Permission granted to share info w Relationship: significant other  Permission granted to share info w Contact Information: [phone]  Emotional Assessment       Orientation: : Oriented to Self, Oriented to Place, Oriented to  Time, Oriented to Situation   Psych Involvement: No (comment)  Admission diagnosis:  Dehydration [E86.0] Hypokalemia [E87.6] Hyponatremia [E87.1] AKI (acute kidney injury) (HCC) [N17.9] Patient Active Problem List   Diagnosis Date Noted   AKI (acute kidney injury) (HCC) 01/11/2021   Chronic combined systolic and diastolic CHF (congestive heart failure) (HCC) 01/11/2021   Hyponatremia    Hypokalemia 01/10/2021   ICD (implantable cardioverter-defibrillator) in place 08/17/2020   NICM (nonischemic cardiomyopathy) (HCC) 07/20/2020   Acute on chronic systolic CHF (congestive heart failure), NYHA class 4 (HCC)    Aortic valve stenosis    Paroxysmal atrial fibrillation (HCC) 04/01/2020   Moderate aortic stenosis 04/01/2020   Mild mitral regurgitation 04/01/2020   Morbid obesity (HCC) 04/01/2020   Sleep  apnea    Essential hypertension    Diverticulitis    Diabetes mellitus without complication (HCC)    Adjustment disorder    PCP:  [name], NP Pharmacy:   [facility] [phone] - [place], [place] - [address] AT [facility] [address] [facility] [address] [place] [address] Phone: [phone] Fax:  [phone]     Social Determinants of Health (SDOH) Interventions    Readmission Risk Interventions No flowsheet data found.

## 2021-01-11 NOTE — Assessment & Plan Note (Signed)
-   baseline creatinine ~ 0.9 - patient presents with increase in creat >0.3 mg/dL above baseline presumed to have occurred within past 7 days PTA - etiology considered overdiuresis due to accidental extra torsemide (patient taking 500 mg BID for approx 10 days due to new script from prior strength of 20 mg tabs and he was taking 5 tabs BID at that time) - low rate IVF (NS@50 ) in setting of severe CHF; currently no SOB  - repeat BMP in am

## 2021-01-11 NOTE — Progress Notes (Signed)
Patient K level 2.5, DR. Girguis notified.

## 2021-01-11 NOTE — Progress Notes (Signed)
Pt medication not in medication drawer or room. RX message sent.

## 2021-01-11 NOTE — Assessment & Plan Note (Signed)
-   Hypotensive on admission due to overdiuresis - Holding antihypertensives - Resume as able

## 2021-01-11 NOTE — Assessment & Plan Note (Signed)
--  continue CPAP

## 2021-01-11 NOTE — Progress Notes (Addendum)
MEDICATION RELATED CONSULT NOTE    Pharmacy Consult for continuation of home milrinone dosing Indication: Low output HF  Allergies  Allergen Reactions   Semaglutide Other (See Comments)    Nausea ,vomiting ,back pain      Vital Signs: Temp: 97.7 F (36.5 C) (08/02 0339) Temp Source: Oral (08/02 0339) BP: 89/53 (08/02 0339) Pulse Rate: 70 (08/02 0339) Intake/Output from previous day: 08/01 0701 - 08/02 0700 In: -  Out: 2150 [Urine:2150] Intake/Output from this shift: Total I/O In: -  Out: 2150 [Urine:2150]  Labs: Recent Labs    01/10/21 1626 01/10/21 1951  WBC  --  11.5*  HGB  --  12.8*  HCT  --  35.6*  PLT  --  238  CREATININE 1.29* 1.30*  MG  --  2.8*  ALBUMIN  --  4.2  PROT  --  7.5  AST  --  24  ALT  --  22  ALKPHOS  --  45  BILITOT  --  0.8   CrCl cannot be calculated (Unknown ideal weight.).   Microbiology: Recent Results (from the past 720 hour(s))  Resp Panel by RT-PCR (Flu A&B, Covid) Nasopharyngeal Swab     Status: None   Collection Time: 01/10/21  8:48 PM   Specimen: Nasopharyngeal Swab; Nasopharyngeal(NP) swabs in vial transport medium  Result Value Ref Range Status   SARS Coronavirus 2 by RT PCR NEGATIVE NEGATIVE Final    Comment: (NOTE) SARS-CoV-2 target nucleic acids are NOT DETECTED.  The SARS-CoV-2 RNA is generally detectable in upper respiratory specimens during the acute phase of infection. The lowest concentration of SARS-CoV-2 viral copies this assay can detect is 138 copies/mL. A negative result does not preclude SARS-Cov-2 infection and should not be used as the sole basis for treatment or other patient management decisions. A negative result may occur with  improper specimen collection/handling, submission of specimen other than nasopharyngeal swab, presence of viral mutation(s) within the areas targeted by this assay, and inadequate number of viral copies(<138 copies/mL). A negative result must be combined with clinical  observations, patient history, and epidemiological information. The expected result is Negative.  Fact Sheet for Patients:  BloggerCourse.com  Fact Sheet for Healthcare Providers:  SeriousBroker.it  This test is no t yet approved or cleared by the [place] FDA and  has been authorized for detection and/or diagnosis of SARS-CoV-2 by FDA under an Emergency Use Authorization (EUA). This EUA will remain  in effect (meaning this test can be used) for the duration of the COVID-19 declaration under Section 564(b)(1) of the Act, 21 U.S.C.section 360bbb-3(b)(1), unless the authorization is terminated  or revoked sooner.       Influenza A by PCR NEGATIVE NEGATIVE Final   Influenza B by PCR NEGATIVE NEGATIVE Final    Comment: (NOTE) The Xpert Xpress SARS-CoV-2/FLU/RSV plus assay is intended as an aid in the diagnosis of influenza from Nasopharyngeal swab specimens and should not be used as a sole basis for treatment. Nasal washings and aspirates are unacceptable for Xpert Xpress SARS-CoV-2/FLU/RSV testing.  Fact Sheet for Patients: BloggerCourse.com  Fact Sheet for Healthcare Providers: SeriousBroker.it  This test is not yet approved or cleared by the [place] FDA and has been authorized for detection and/or diagnosis of SARS-CoV-2 by FDA under an Emergency Use Authorization (EUA). This EUA will remain in effect (meaning this test can be used) for the duration of the COVID-19 declaration under Section 564(b)(1) of the Act, 21 U.S.C. section 360bbb-3(b)(1), unless the authorization is  terminated or revoked.  Performed at [facility], [address] [place], [place] [address]      Assessment: Pt is currently on outpatient milrinone therapy, consulted to continue therapy at same dose while inpatient. K+ is low, being replaced.   Plan:  -Continue  Milrinone at home dose of 0.25 mcg/kg/min -Follow-up AM labs to see if he needs additional K+ replacement  Abran Duke, PharmD, BCPS Clinical Pharmacist Phone: [phone]

## 2021-01-11 NOTE — ED Notes (Signed)
Dr. Anitra Lauth gave verbal orders to use patient PICC that is already in place.

## 2021-01-11 NOTE — Consult Note (Addendum)
Advanced Heart Failure Team Consult Note   Primary Physician: Dorothyann Peng, NP PCP-Cardiologist:  Werner Lean, MD  Reason for Consultation: Heart Failure/ Hypokalemia   HPI:    Chad Wilkerson is seen today for evaluation of heart failure  at the request of Dr Sabino Gasser.   75 y.o. with history of chronic systolic CHF, paroxysmal atrial fibrillation, and type 2 diabetes presents for followup of CHF.  Patient has a long history of nonischemic cardiomyopathy, has [employer] ICD.  Cath in 2/22 showed mild nonobstructive CAD.  Echo in 4/22 showed EF < 20%, severe LV dilation, moderately decreased RV systolic function with mild RV enlargement, severe biatrial enlargement, mild-moderate MR, no more than moderate AS. Recent workup has suggested no more than moderate aortic stenosis, not thought to be severe enough for TAVR.   Patient was admitted in 4/22 with acute on chronic systolic CHF and marked volume overload.  Co-ox was low and milrinone 0.25 was started.  He was extensively diuresed.  He had sustained VT terminated by ICD discharge while in the hospital.  Tikosyn was stopped and amiodarone was started. In the past, he had not tolerated amiodarone but has had no problem with it this time.  I tried to wean him off milrinone, but co-ox dropped to 49% so I restarted it, and he went home on milrinone.  We discussed LVAD while he was in the hospital and workup was completed.  He wanted to go home for some time to think about it.  Followed  closely in the HF clinic and was last seen 12/16/20. At that time he was volume overloaded. He continued on torsemide 100 mg bid andmetolazone was added weekly. K was also increased.  Yesterday he sent Dr Aundra Dubin staff message that he mistakenly took 500 mg torsemide twice a day. Says he felt dizzy. He was instructed to go to the ED. K was 2.3 . Given K runs and K supplements. Remains on milrinone 0.25 mcg.   K 2.3>2.5>2.3  Creatinine 1.3>1.3>1.2     Review of Systems: [y] = yes, [ ]  = no   General: Weight gain [ ] ; Weight loss [ ] ; Anorexia [ ] ; Fatigue [ Y]; Fever [ ] ; Chills [ ] ; Weakness [ Y]  Cardiac: Chest pain/pressure [ ] ; Resting SOB [ ] ; Exertional SOB [ Y]; Orthopnea [ ] ; Pedal Edema [ ] ; Palpitations [ ] ; Syncope [ ] ; Presyncope [ ] ; Paroxysmal nocturnal dyspnea[ ]   Pulmonary: Cough [ ] ; Wheezing[ ] ; Hemoptysis[ ] ; Sputum [ ] ; Snoring [ ]   GI: Vomiting[ ] ; Dysphagia[ ] ; Melena[ ] ; Hematochezia [ ] ; Heartburn[ ] ; Abdominal pain [ ] ; Constipation [ ] ; Diarrhea [ ] ; BRBPR [ ]   GU: Hematuria[ ] ; Dysuria [ ] ; Nocturia[ ]   Vascular: Pain in legs with walking [ ] ; Pain in feet with lying flat [ ] ; Non-healing sores [ ] ; Stroke [ ] ; TIA [ ] ; Slurred speech [ ] ;  Neuro: Headaches[ ] ; Vertigo[ ] ; Seizures[ ] ; Paresthesias[ ] ;Blurred vision [ ] ; Diplopia [ ] ; Vision changes [ ]   Ortho/Skin: Arthritis [ ] ; Joint pain [ Y]; Muscle pain [ ] ; Joint swelling [ ] ; Back Pain [ Y]; Rash [ ]   Psych: Depression[ ] ; Anxiety[ ]   Heme: Bleeding problems [ ] ; Clotting disorders [ ] ; Anemia [ ]   Endocrine: Diabetes [Y ]; Thyroid dysfunction[ ]   Home Medications Prior to Admission medications   Medication Sig Start Date End Date Taking? Authorizing Provider  acetaminophen (TYLENOL) 500 MG tablet Take 1,000  mg by mouth every 6 (six) hours as needed for headache or mild pain.   Yes [provider]  amiodarone (PACERONE) 200 MG tablet Take 200 mg by mouth daily.   Yes [provider]  Arginine (L-ARGININE-500) 500 MG CAPS Take 500 mg by mouth daily.   Yes [provider]  Ascorbic Acid (VITAMIN C) 1000 MG tablet Take 1,000 mg by mouth 2 (two) times daily.   Yes [provider]  atorvastatin (LIPITOR) 20 MG tablet Take 1 tablet (20 mg total) by mouth daily. 11/11/20  Yes Larey Dresser, MD  B Complex Vitamins (B COMPLEX PO) Take 1 tablet by mouth daily.   Yes [provider]  Biotin 10 MG TABS Take 10 mg by  mouth daily. 10 mg - 10,000 mcg   Yes [provider]  budesonide-formoterol (SYMBICORT) 160-4.5 MCG/ACT inhaler Inhale 2 puffs into the lungs 2 (two) times daily. 08/02/20  Yes Freddi Starr, MD  CALCIUM PO Take 1 tablet by mouth 2 (two) times daily.   Yes [provider]  cholecalciferol (VITAMIN D3) 25 MCG (1000 UNIT) tablet Take 1,000 Units by mouth 2 (two) times daily.   Yes [provider]  CHROMIUM PO Take 1 tablet by mouth 2 (two) times daily.   Yes [provider]  dapagliflozin propanediol (FARXIGA) 10 MG TABS tablet Take 1 tablet (10 mg total) by mouth daily before breakfast. 07/13/20  Yes Isaac Bliss, Rayford Halsted, MD  digoxin (LANOXIN) 0.125 MG tablet Take 1 tablet (0.125 mg total) by mouth daily. 11/29/20  Yes Larey Dresser, MD  ELIQUIS 5 MG TABS tablet Take 1 tablet (5 mg total) by mouth 2 (two) times daily. 11/24/20  Yes Chandrasekhar, Mahesh A, MD  escitalopram (LEXAPRO) 10 MG tablet TAKE 1 TABLET(10 MG) BY MOUTH DAILY Patient taking differently: Take 10 mg by mouth daily. 12/15/20  Yes Nafziger, Tommi Rumps, NP  levOCARNitine (L-CARNITINE) 500 MG TABS Take 500 mg by mouth daily.   Yes [provider]  losartan (COZAAR) 25 MG tablet Take 0.5 tablets (12.5 mg total) by mouth daily. 10/26/20  Yes Larey Dresser, MD  Magnesium 400 MG TABS Take 400 mg by mouth daily. 05/11/20  Yes Kathyrn Drown D, NP  metFORMIN (GLUCOPHAGE) 500 MG tablet Take 1-3 tablets (500-1,500 mg total) by mouth See admin instructions. Take one tablet (500 mg) by mouth daily with breakfast and three tablets (1500 mg) daily with supper 07/22/20  Yes Isaiah Serge, NP  metolazone (ZAROXOLYN) 2.5 MG tablet Take 1 tablet (2.5 mg total) by mouth every Friday. 12/17/20  Yes Larey Dresser, MD  milrinone [facility]) 20 MG/100 ML SOLN infusion Inject 0.0243 mg/min into the vein continuous. 10/09/20  Yes Mikhail, Velta Addison, DO  potassium chloride SA (KLOR-CON) 20 MEQ tablet Take 2  tablets (40 mEq total) by mouth 2 (two) times daily. Take 2 extra tablets on metolazone days Patient taking differently: Take 40-60 mEq by mouth 2 (two) times daily. 59m in the morning and 648mat night. Take 2 extra tablets on metolazone days 12/16/20  Yes McLarey DresserMD  spironolactone (ALDACTONE) 25 MG tablet Take 1 tablet (25 mg total) by mouth daily. 10/26/20  Yes Chandrasekhar, Mahesh A, MD  torsemide (DEMADEX) 100 MG tablet Take 1 tablet (100 mg total) by mouth 2 (two) times daily. 12/22/20  Yes McLarey DresserMD  vitamin E 180 MG (400 UNITS) capsule Take 400 Units by mouth daily.   Yes [provider]  Zinc 50 MG TABS Take 50 mg by mouth daily.   Yes [provider]  Zn-Pyg Afri-Nettle-Saw Palmet (SAW PALMETTO COMPLEX PO) Take 540 mg by mouth daily.   Yes [provider]  Donald Siva test strip Use as instructed to check blood sugars twice daily 10/05/20   Dorothyann Peng, NP    Past Medical History: Past Medical History:  Diagnosis Date   Abnormal liver function tests    Adjustment disorder    Aortic stenosis    Benign colon polyp    Chronic systolic CHF (congestive heart failure) ([place])    Diabetes mellitus without complication ([place])    Diverticulitis    Essential hypertension    Heart disease    Hypogonadism male    Hyponatremia    ICD (implantable cardioverter-defibrillator) in place    Mitral regurgitation    NICM (nonischemic cardiomyopathy) ([facility])    PAF (paroxysmal atrial fibrillation) ([facility])    Sleep apnea    Tricuspid regurgitation    Urine incontinence    Ventricular tachycardia ([place])    Vitamin D deficiency     Past Surgical History: Past Surgical History:  Procedure Laterality Date   CHOLECYSTECTOMY     RIGHT HEART CATH N/A 10/05/2020   Procedure: RIGHT HEART CATH;  Surgeon: Larey Dresser, MD;  Location: [facility];  Service: Cardiovascular;  Laterality: N/A;   RIGHT/LEFT HEART CATH AND CORONARY ANGIOGRAPHY N/A  07/19/2020   Procedure: RIGHT/LEFT HEART CATH AND CORONARY ANGIOGRAPHY;  Surgeon: Burnell Blanks, MD;  Location: [facility];  Service: Cardiovascular;  Laterality: N/A;   TONSILLECTOMY      Family History: Family History  Problem Relation Age of Onset   Heart attack Mother    High Cholesterol Mother    Asthma Father    Kidney disease Sister    Stroke Maternal Grandfather    Heart attack Paternal Grandfather     Social History: Social History   Socioeconomic History   Marital status: Widowed    Spouse name: Not on file   Number of children: Not on file   Years of education: Not on file   Highest education level: Not on file  Occupational History   Not on file  Tobacco Use   Smoking status: Former   Smokeless tobacco: Never  Vaping Use   Vaping Use: Never used  Substance and Sexual Activity   Alcohol use: Not Currently   Drug use: Not Currently   Sexual activity: Not on file  Other Topics Concern   Not on file  Social History Narrative   Not on file   Social Determinants of Health   Financial Resource Strain: Low Risk    Difficulty of Paying Living Expenses: Not very hard  Food Insecurity: No Food Insecurity   Worried About Running Out of Food in the Last Year: Never true   Ran Out of Food in the Last Year: Never true  Transportation Needs: No Transportation Needs   Lack of Transportation (Medical): No   Lack of Transportation (Non-Medical): No  Physical Activity: Inactive   Days of Exercise per Week: 0 days   Minutes of Exercise per Session: 0 min  Stress: No Stress Concern Present   Feeling of Stress : Not at all  Social Connections: Moderately Isolated   Frequency of Communication with Friends and Family: Once a week   Frequency of Social Gatherings with Friends and Family: More than three times a week   Attends Religious  Services: Never   Active Member of Clubs or Organizations: No   Attends Archivist Meetings: Never   Marital  Status: Living with partner    Allergies:  Allergies  Allergen Reactions   Semaglutide Other (See Comments)    Nausea ,vomiting ,back pain    Objective:    Vital Signs:   Temp:  [97.7 F (36.5 C)-98.2 F (36.8 C)] 97.7 F (36.5 C) (08/02 0755) Pulse Rate:  [49-71] 71 (08/02 0755) Resp:  [15-25] 19 (08/02 0755) BP: (83-109)/(47-61) 98/55 (08/02 0755) SpO2:  [92 %-99 %] 94 % (08/02 0758) Last BM Date: 01/11/21  Weight change: There were no vitals filed for this visit.  Intake/Output:   Intake/Output Summary (Last 24 hours) at 01/11/2021 1050 Last data filed at 01/11/2021 0242 Gross per 24 hour  Intake --  Output 2150 ml  Net -2150 ml      Physical Exam    General: No resp difficulty HEENT: normal Neck: supple. JVP 5-6 . Carotids 2+ bilat; no bruits. No lymphadenopathy or thyromegaly appreciated. Cor: PMI nondisplaced. Irregular rate & rhythm. No rubs, gallops or murmurs. Lungs: clear Abdomen: soft, nontender, nondistended. No hepatosplenomegaly. No bruits or masses. Good bowel sounds. Extremities: no cyanosis, clubbing, rash, edema. RUE PICC  Neuro: alert & orientedx3, cranial nerves grossly intact. moves all 4 extremities w/o difficulty. Affect pleasant   Telemetry  EKG now. ? A fib.    EKG    A Fib 55 bpm  Basic Metabolic Panel: Recent Labs  Lab 01/10/21 1626 01/10/21 1951 01/11/21 0449  NA 124* 123* 126*  K 2.3* 2.5* 2.3*  CL 74* 73* 79*  CO2 35* 34* 31  GLUCOSE 148* 174* 154*  BUN 69* 71* 61*  CREATININE 1.29* 1.30* 1.22  CALCIUM 9.9 9.7 9.7  MG  --  2.8*  --     Liver Function Tests: Recent Labs  Lab 01/10/21 1951  AST 24  ALT 22  ALKPHOS 45  BILITOT 0.8  PROT 7.5  ALBUMIN 4.2   No results for input(s): LIPASE, AMYLASE in the last 168 hours. No results for input(s): AMMONIA in the last 168 hours.  CBC: Recent Labs  Lab 01/10/21 1951  WBC 11.5*  NEUTROABS 9.1*  HGB 12.8*  HCT 35.6*  MCV 84.4  PLT 238    Cardiac  Enzymes: No results for input(s): CKTOTAL, CKMB, CKMBINDEX, TROPONINI in the last 168 hours.  BNP: BNP (last 3 results) Recent Labs    09/28/20 1613  BNP 3,328.4*    ProBNP (last 3 results) Recent Labs    04/01/20 1509  PROBNP 5,001*     CBG: Recent Labs  Lab 01/11/21 0757  GLUCAP 181*    Coagulation Studies: No results for input(s): LABPROT, INR in the last 72 hours.   Imaging   No results found.   Medications:     Current Medications:  apixaban  5 mg Oral BID   atorvastatin  20 mg Oral Daily   Chlorhexidine Gluconate Cloth  6 each Topical Daily   escitalopram  10 mg Oral Daily   insulin aspart  0-5 Units Subcutaneous QHS   insulin aspart  0-9 Units Subcutaneous TID WC   mometasone-formoterol  2 puff Inhalation BID   potassium chloride  40 mEq Oral Q4H    Infusions:  sodium chloride     milrinone 0.25 mcg/kg/min (01/11/21 0530)       Assessment/Plan   Hypokalemia K supplemented. Follow closely.  2. Chronic systolic CHF: Long-standing NICM,  followed in the past in [place].  Has MDT ICD.  Echo 09/28/20 with EF <20%, severe LV dilation, mild RV dilation with moderate RV dysfunction, mild-moderate MR, moderate-severe TR, severe biatrial enlargement, no more than moderate AS.  Last cath in 2/22 showed nonobstructive mild CAD, elevated right and left heart filling pressures with low CI 2.04; AS no more than moderate.  Cause of cardiomyopathy uncertain, he is generally in NSR so unlikely to be tachy-mediated CMP.  Possible remote viral infection/viral myocarditis. NYHA class IV at 4/22 admission.  He was diuresed on milrinone, then milrinone stopped with drop in co-ox to 49%.  Milrinone restarted at 0.25, co-ox back up to 68% and discharged home on milrinone.    - NYHA III. Volume status stable. Will set up CVP and check CO-OX  - Continue milrinone.25, he is milrinone dependent due to low output HF.  Continuing milrinone long-term is not a great option for  him given recurrent VT/VF with ICD shocks. - He does not appear volume overloaded. Hold diuretics.  - Hold dig, farxiga and spiro.  - He has low output HF.  RV function on RHC appeared adequate with PAPi 4.1, creatinine stable.  We have been discussing LVAD and he has had full workup, he has met an LVAD patient and was able to get his questions about the LVAD answered.  He wanted to go home with milrinone to give him a break from the hospital.  We have had discussions about the limitations of milrinone and about how LVAD is really the only good option at this point to reduce mortality, I reiterated this today.   He still is not sure what he wants to do.  Fortunately, he has had no further VT.  He asks about any other medical options.  Unfortunately, they are few. We can try him on omecamtiv when it is available.  Barostimulation activation therapy has not been studied in patients with end stage CHF on inotropes. 3. Atrial fibrillation: Paroxysmal.  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 Off Tikosyn after VF on 4/22.  Did not tolerate amiodarone in the past but doing well on it so far. - Last in [place] 12/16/20.  - Check EKG now. Restart amio 200 mg daily - Continue Eliquis. 4. Aortic stenosis: Based on full evaluation (cath and echo), suspect no more than moderate aortic stenosis.  Per Dr Aundra Dubin - I do  not think he would benefit from TAVR at this point. 5. 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 - Continue statin, do not think this has caused increased LFTs. - No chest pain.  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  Milrinone decreased to 0.25.  Tikosyn stopped and amiodarone started. No VT since 5/22.  Risk will be high as long as he is on milrinone. - Restart amio 200 mg daily.  7. Hyponatremia: History of hypervolemic hyponatremia. -Sodium 126 - Keep fluid restricted. 8. Elevated LFTs:  ?Amiodarone, ?CHF.  Doubt related to statin. Viral hepatitis labs negative.  Abdominal US with possible  early cirrhosis, no gallbladder.  Transfer to 3e Heart Failure unit for CVP and CO-OX   Obtains EKG now.   Length of Stay: 0  Darrick Grinder, NP  01/11/2021, 10:50 AM  Advanced Heart Failure Team Pager [phone] (M-F; 7a - 5p)  Please contact [facility] for night-coverage after hours (4p -7a ) and weekends on [email or web address]  Patient seen with NP, agree with the above note.   He was admitted  after mistakenly taking torsemide 500 mg bid (rather than 100 mg bid) for several days.  He felt lightheaded and was noted to be markedly hypokalemic with mild AKI.  CVP 7 currently.    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   General: NAD Neck: No JVD, no thyromegaly or thyroid nodule.  Lungs: Clear to auscultation bilaterally with normal respiratory effort. CV: Nondisplaced PMI.  Heart regular S1/S2, no S3/S4, 2/6 early SEM RUSB.  No peripheral edema.  No carotid bruit.  Normal pedal pulses.  Abdomen: Soft, nontender, no hepatosplenomegaly, no distention.  Skin: Intact without lesions or rashes.  Neurologic: Alert and oriented x 3.  Psych: Normal affect. Extremities: No clubbing or cyanosis.  HEENT: Normal.   For now, we will hold torsemide, digoxin, Farxiga, spironolactone, losartan.  Aggressively replace K, repeat BMET in pm.  CVP not markedly low, think we can stop IV fluid and encourage po hydration.  Follow co-ox (sent) and continue home milrinone dosing.  Continue Eliquis.  Restart diuretic when creatinine trends back to baseline and CVP starts rising.    Loralie Champagne 01/11/2021 4:21 PM

## 2021-01-12 ENCOUNTER — Encounter (HOSPITAL_COMMUNITY): Payer: Self-pay | Admitting: Internal Medicine

## 2021-01-12 DIAGNOSIS — N179 Acute kidney failure, unspecified: Secondary | ICD-10-CM | POA: Diagnosis not present

## 2021-01-12 DIAGNOSIS — I5042 Chronic combined systolic (congestive) and diastolic (congestive) heart failure: Secondary | ICD-10-CM | POA: Diagnosis not present

## 2021-01-12 DIAGNOSIS — I480 Paroxysmal atrial fibrillation: Secondary | ICD-10-CM | POA: Diagnosis not present

## 2021-01-12 DIAGNOSIS — E876 Hypokalemia: Secondary | ICD-10-CM | POA: Diagnosis not present

## 2021-01-12 LAB — BASIC METABOLIC PANEL
Anion gap: 13 (ref 5–15)
BUN: 30 mg/dL — ABNORMAL HIGH (ref 8–23)
CO2: 31 mmol/L (ref 22–32)
Calcium: 9.2 mg/dL (ref 8.9–10.3)
Chloride: 85 mmol/L — ABNORMAL LOW (ref 98–111)
Creatinine, Ser: 0.89 mg/dL (ref 0.61–1.24)
GFR, Estimated: >60 mL/min (ref >60)
Glucose, Bld: 175 mg/dL — ABNORMAL HIGH (ref 70–99)
Potassium: 3.3 mmol/L — ABNORMAL LOW (ref 3.5–5.1)
Sodium: 129 mmol/L — ABNORMAL LOW (ref 135–145)

## 2021-01-12 LAB — GLUCOSE, CAPILLARY
Glucose-Capillary: 152 mg/dL — ABNORMAL HIGH (ref 70–99)
Glucose-Capillary: 237 mg/dL — ABNORMAL HIGH (ref 70–99)
Glucose-Capillary: 168 mg/dL — ABNORMAL HIGH (ref 70–99)
Glucose-Capillary: 170 mg/dL — ABNORMAL HIGH (ref 70–99)

## 2021-01-12 LAB — CBC WITH DIFFERENTIAL/PLATELET
Abs Immature Granulocytes: 0.06 10*3/uL (ref 0.00–0.07)
Basophils Absolute: 0 10*3/uL (ref 0.0–0.1)
Basophils Relative: 0 %
Eosinophils Absolute: 0.1 10*3/uL (ref 0.0–0.5)
Eosinophils Relative: 1 %
HCT: 33.2 % — ABNORMAL LOW (ref 39.0–52.0)
Hemoglobin: 11.9 g/dL — ABNORMAL LOW (ref 13.0–17.0)
Immature Granulocytes: 1 %
Lymphocytes Relative: 11 %
Lymphs Abs: 1.2 10*3/uL (ref 0.7–4.0)
MCH: 31.6 pg (ref 26.0–34.0)
MCHC: 35.8 g/dL (ref 30.0–36.0)
MCV: 88.1 fL (ref 80.0–100.0)
Monocytes Absolute: 1.1 10*3/uL — ABNORMAL HIGH (ref 0.1–1.0)
Monocytes Relative: 10 %
Neutro Abs: 8.8 10*3/uL — ABNORMAL HIGH (ref 1.7–7.7)
Neutrophils Relative %: 77 %
Platelets: 188 10*3/uL (ref 150–400)
RBC: 3.77 MIL/uL — ABNORMAL LOW (ref 4.22–5.81)
RDW: 16.9 % — ABNORMAL HIGH (ref 11.5–15.5)
WBC: 11.3 10*3/uL — ABNORMAL HIGH (ref 4.0–10.5)
nRBC: 0 % (ref 0.0–0.2)

## 2021-01-12 LAB — MAGNESIUM: Magnesium: 2.6 mg/dL — ABNORMAL HIGH (ref 1.7–2.4)

## 2021-01-12 LAB — COOXEMETRY PANEL
Carboxyhemoglobin: 1.9 % — ABNORMAL HIGH (ref 0.5–1.5)
Methemoglobin: 0.9 % (ref 0.0–1.5)
O2 Saturation: 76.3 %
Total hemoglobin: 11.7 g/dL — ABNORMAL LOW (ref 12.0–16.0)

## 2021-01-12 LAB — UREA NITROGEN, URINE: Urea Nitrogen, Ur: 631 mg/dL

## 2021-01-12 LAB — OSMOLALITY: Osmolality: 288 mOsm/kg (ref 275–295)

## 2021-01-12 MED ORDER — SPIRONOLACTONE 25 MG PO TABS
25.0000 mg | ORAL_TABLET | Freq: Every day | ORAL | Status: DC
  Administered 2021-01-12 – 2021-01-14 (×3): 25 mg via ORAL
  Filled 2021-01-12 (×3): qty 1

## 2021-01-12 MED ORDER — POTASSIUM CHLORIDE CRYS ER 20 MEQ PO TBCR
40.0000 meq | EXTENDED_RELEASE_TABLET | ORAL | Status: AC
Start: 2021-01-12 — End: 2021-01-12
  Administered 2021-01-12 (×2): 40 meq via ORAL
  Filled 2021-01-12 (×2): qty 2

## 2021-01-12 MED ORDER — POTASSIUM CHLORIDE CRYS ER 20 MEQ PO TBCR
40.0000 meq | EXTENDED_RELEASE_TABLET | Freq: Once | ORAL | Status: AC
  Administered 2021-01-12: 40 meq via ORAL
  Filled 2021-01-12: qty 2

## 2021-01-12 MED ORDER — DAPAGLIFLOZIN PROPANEDIOL 10 MG PO TABS
10.0000 mg | ORAL_TABLET | Freq: Every day | ORAL | Status: DC
  Administered 2021-01-12 – 2021-01-14 (×3): 10 mg via ORAL
  Filled 2021-01-12 (×3): qty 1

## 2021-01-12 NOTE — Progress Notes (Addendum)
  Advanced Heart Failure Rounding Note  PCP-Cardiologist: Mahesh A Chandrasekhar, MD   Subjective:   Admitted with hypokalemia after taking 500 mg torsemide twice daily.   Supplementing potassium. Remains on milrinone 0.25 mcg which is his home dose.   Feeling better today.   Objective:   Weight Range: 98.4 kg Body mass index is 32.98 kg/m.   Vital Signs:   Temp:  [97.6 F (36.4 C)-99.1 F (37.3 C)] 97.8 F (36.6 C) (08/03 0740) Pulse Rate:  [64-78] 64 (08/03 0740) Resp:  [18-21] 20 (08/03 0740) BP: (99-116)/(54-65) 104/65 (08/03 0740) SpO2:  [92 %-98 %] 98 % (08/03 0818) FiO2 (%):  [21 %] 21 % (08/03 0818) Weight:  [98.4 kg] 98.4 kg (08/03 0343) Last BM Date: 01/11/21  Weight change: Filed Weights   01/12/21 0343  Weight: 98.4 kg    Intake/Output:   Intake/Output Summary (Last 24 hours) at 01/12/2021 0843 Last data filed at 01/12/2021 0041 Gross per 24 hour  Intake 1135.97 ml  Output 2650 ml  Net -1514.03 ml      Physical Exam   CVP 7-8  General:  Well appearing. No resp difficulty HEENT: Normal Neck: Supple. JVP 7-8 . Carotids 2+ bilat; no bruits. No lymphadenopathy or thyromegaly appreciated. Cor: PMI nondisplaced. Regular rate & rhythm. No rubs, gallops or murmurs. Lungs: Clear Abdomen: Soft, nontender, nondistended. No hepatosplenomegaly. No bruits or masses. Good bowel sounds. Extremities: No cyanosis, clubbing, rash, edema. RUE PICC Neuro: Alert & orientedx3, cranial nerves grossly intact. moves all 4 extremities w/o difficulty. Affect pleasant   Telemetry   SR 1st degree 60-70s   EKG    N/A  Labs    CBC Recent Labs    01/10/21 1951 01/12/21 0625  WBC 11.5* 11.3*  NEUTROABS 9.1* 8.8*  HGB 12.8* 11.9*  HCT 35.6* 33.2*  MCV 84.4 88.1  PLT 238 188   Basic Metabolic Panel Recent Labs    01/10/21 1951 01/11/21 0449 01/11/21 1559 01/12/21 0422  NA 123*   < > 126* 129*  K 2.5*   < > 2.5* 3.3*  CL 73*   < > 83* 85*  CO2 34*   <  > 33* 31  GLUCOSE 174*   < > 171* 175*  BUN 71*   < > 43* 30*  CREATININE 1.30*   < > 1.12 0.89  CALCIUM 9.7   < > 8.9 9.2  MG 2.8*  --   --  2.6*   < > = values in this interval not displayed.   Liver Function Tests Recent Labs    01/10/21 1951  AST 24  ALT 22  ALKPHOS 45  BILITOT 0.8  PROT 7.5  ALBUMIN 4.2   No results for input(s): LIPASE, AMYLASE in the last 72 hours. Cardiac Enzymes No results for input(s): CKTOTAL, CKMB, CKMBINDEX, TROPONINI in the last 72 hours.  BNP: BNP (last 3 results) Recent Labs    09/28/20 1613  BNP 3,328.4*    ProBNP (last 3 results) Recent Labs    04/01/20 1509  PROBNP 5,001*     D-Dimer No results for input(s): DDIMER in the last 72 hours. Hemoglobin A1C Recent Labs    01/11/21 0440  HGBA1C 6.7*   Fasting Lipid Panel No results for input(s): CHOL, HDL, LDLCALC, TRIG, CHOLHDL, LDLDIRECT in the last 72 hours. Thyroid Function Tests No results for input(s): TSH, T4TOTAL, T3FREE, THYROIDAB in the last 72 hours.  Invalid input(s): FREET3  Other results:   Imaging      No results found.   Medications:     Scheduled Medications:  amiodarone  200 mg Oral Daily   apixaban  5 mg Oral BID   atorvastatin  20 mg Oral Daily   Chlorhexidine Gluconate Cloth  6 each Topical Daily   digoxin  0.125 mg Oral Daily   escitalopram  10 mg Oral Daily   insulin aspart  0-5 Units Subcutaneous QHS   insulin aspart  0-9 Units Subcutaneous TID WC   mometasone-formoterol  2 puff Inhalation BID   potassium chloride  40 mEq Oral Q4H   sodium chloride flush  3 mL Intravenous Q12H    Infusions:  milrinone 0.25 mcg/kg/min (01/12/21 0438)    PRN Medications: acetaminophen **OR** acetaminophen   Assessment/Plan   Hypokalemia K 3.3. Supplementing K today.  - Low but trending up. Restart spiro 25 mg daily. 2. Chronic systolic CHF: Long-standing NICM, followed in the past in [place].  Has MDT ICD.  Echo 09/28/20 with EF <20%, severe  LV dilation, mild RV dilation with moderate RV dysfunction, mild-moderate MR, moderate-severe TR, severe biatrial enlargement, no more than moderate AS.  Last cath in 2/22 showed nonobstructive mild CAD, elevated right and left heart filling pressures with low CI 2.04; AS no more than moderate.  Cause of cardiomyopathy uncertain, he is generally in NSR so unlikely to be tachy-mediated CMP.  Possible remote viral infection/viral myocarditis. NYHA class IV at 4/22 admission.  He was diuresed on milrinone, then milrinone stopped with drop in co-ox to 49%.  Milrinone restarted at 0.25, co-ox back up to 68% and discharged home on milrinone.    - NYHA III. Volume status stable. CVP 7-8. Check CO- OX stable.   -Continue milrinone 0.25, he is milrinone dependent due to low output HF.  Continuing milrinone long-term is not a great option for him given recurrent VT/VF with ICD shocks. - CVP 7-8. Restart spiro 25 mg daily - Continue dig, dig level 0.6 - Hold torsemide  - Restart Farxiga - He has low output HF.  RV function on RHC appeared adequate with PAPi 4.1, creatinine stable.  We have been discussing LVAD and he has had full workup, he has met an LVAD patient and was able to get his questions about the LVAD answered.  He wanted to go home with milrinone to give him a break from the hospital.  We have had discussions about the limitations of milrinone and about how LVAD is really the only good option at this point to reduce mortality, I reiterated this today.   He still is not sure what he wants to do.  Fortunately, he has had no further VT.  He asks about any other medical options.  Unfortunately, they are few. We can try him on omecamtiv when it is available.  Barostimulation activation therapy has not been studied in patients with end stage CHF on inotropes. 3. Atrial fibrillation: Paroxysmal.  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 Off Tikosyn after VF on 4/22.  Did not tolerate amiodarone in the past but doing  well on it so far. - Last in SR 12/16/20. - In SR with 1st degee AV block. Continue amio 200 mg daily - Continue Eliquis. 4. Aortic stenosis: Based on full evaluation (cath and echo), suspect no more than moderate aortic stenosis.  Per Dr Aundra Dubin - I do  not think he would benefit from TAVR at this point. 5. 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 - Continue statin, do not think this  has caused increased LFTs. -No chest pain.  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  Milrinone decreased to 0.25.  Tikosyn stopped and amiodarone started. No VT since 5/22.  Risk will be high as long as he is on milrinone. - Continue  amio 200 mg daily. 7. Hyponatremia: History of hypervolemic hyponatremia. -Sodium trending up 126>129  - Keep fluid restricted. 8. Elevated LFTs:  ?Amiodarone, ?CHF.  Doubt related to statin. Viral hepatitis labs negative.  Abdominal US with possible early cirrhosis, no gallbladder.  Resumption of HH F2F completed for home milrinone 0.25 mcg. [facility] has been following and aware of admit.   Consult cardiac rehab.   Length of Stay: 1  Amy Clegg, NP  01/12/2021, 8:43 AM  Advanced Heart Failure Team Pager [phone] (M-F; 7a - 5p)  Please contact [facility] for night-coverage after hours (5p -7a ) and weekends on [email or web address]  Patient seen with NP, agree with the above note.   Co-ox 76% today on milrinone 0.25.  CVP down to 7-8.  Feeling better.  K up to 3.3 today.  Creatinine back to baseline.  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   General: NAD Neck: No JVD, no thyromegaly or thyroid nodule.  Lungs: Clear to auscultation bilaterally with normal respiratory effort. CV: Nondisplaced PMI.  Heart regular S1/S2, no S3/S4, no murmur.  No peripheral edema.   Abdomen: Soft, nontender, no hepatosplenomegaly, no distention.  Skin: Intact without lesions or rashes.  Neurologic: Alert and oriented x 3.  Psych: Normal affect. Extremities: No clubbing  or cyanosis.  HEENT: Normal.   Replace K today.  Restart Farxiga and spironolactone 25 mg daily. Continue to hold torsemide.   Ambulate in hall today, hopefully restart his correct home dose of torsemide tomorrow and discharge.   Dalton McLean 01/12/2021 10:27 AM  

## 2021-01-12 NOTE — Progress Notes (Signed)
Progress Note    Chad Wilkerson  ZDG:[id number] DOB: [birth year]  DOA: 01/10/2021 PCP: [name], NP      Brief Narrative:    Medical records reviewed and are as summarized below:  Chad Wilkerson is a 75 y.o. male with past medical history significant for chronic combined systolic and diastolic CHF (EF less than 20%, grade 3 diastolic dysfunction), paroxysmal atrial fibrillation, aortic stenosis, hypertension, obstructive sleep apnea, morbid obesity, who presented to the hospital because of abnormal labs.      Assessment/Plan:   Principal Problem:   AKI (acute kidney injury) (HCC) Active Problems:   Sleep apnea   Essential hypertension   Diabetes mellitus without complication (HCC)   Paroxysmal atrial fibrillation (HCC)   Morbid obesity (HCC)   ICD (implantable cardioverter-defibrillator) in place   Hypokalemia   Chronic combined systolic and diastolic CHF (congestive heart failure) (HCC)   Hyponatremia     Body mass index is 32.98 kg/m.  (Obesity)   AKI: Improved.  This is likely prerenal from overdiuresis.  Hypovolemic hyponatremia: Sodium level is slowly improving.  Continue to monitor.  Hypokalemia: Slowly improving.  Continue potassium repletion.  Chronic combined systolic and diastolic CHF, ICD in place: Continue IV milrinone infusion per cardiologist.  EF is less than 20%.  Torsemide on hold because of recent overdiuresis.  Dapagliflozin has been restarted.  Paroxysmal atrial fibrillation: Continue Eliquis and amiodarone.  Off of Tikosyn after V. fib in April 2022 that was terminated with ICD shock  Other comorbidities include obstructive sleep apnea, type II DM, hypertension, CAD    Diet Order             Diet Heart Room service appropriate? Yes; Fluid consistency: Thin  Diet effective now                      Consultants: Cardiologist  Procedures: None    Medications:    amiodarone  200 mg Oral Daily   apixaban  5  mg Oral BID   atorvastatin  20 mg Oral Daily   Chlorhexidine Gluconate Cloth  6 each Topical Daily   digoxin  0.125 mg Oral Daily   escitalopram  10 mg Oral Daily   insulin aspart  0-5 Units Subcutaneous QHS   insulin aspart  0-9 Units Subcutaneous TID WC   mometasone-formoterol  2 puff Inhalation BID   potassium chloride  40 mEq Oral Q4H   potassium chloride  40 mEq Oral Once   sodium chloride flush  3 mL Intravenous Q12H   spironolactone  25 mg Oral Daily   Continuous Infusions:  milrinone 0.25 mcg/kg/min (01/12/21 0438)     Anti-infectives (From admission, onward)    None              Family Communication/Anticipated D/C date and plan/Code Status   DVT prophylaxis: SCDs Start: 01/11/21 0410 apixaban (ELIQUIS) tablet 5 mg     Code Status: Full Code  Family Communication: None Disposition Plan:    Status is: Inpatient  Remains inpatient appropriate because:Inpatient level of care appropriate due to severity of illness  Dispo: The patient is from: Home              Anticipated d/c is to: Home              Patient currently is not medically stable to d/c.   Difficult to place patient No  Subjective:   Interval events noted.  No chest pain or shortness of breath.  Objective:    Vitals:   01/12/21 0038 01/12/21 0343 01/12/21 0740 01/12/21 0818  BP: 113/62 (!) 116/56 104/65   Pulse: 78 75 64   Resp: 18 18 20    Temp: 98.6 F (37 C) 97.6 F (36.4 C) 97.8 F (36.6 C)   TempSrc: Oral Oral Oral   SpO2: 93% 94% 93% 98%  Weight:  98.4 kg     No data found.   Intake/Output Summary (Last 24 hours) at 01/12/2021 1017 Last data filed at 01/12/2021 0041 Gross per 24 hour  Intake 1135.97 ml  Output 2650 ml  Net -1514.03 ml   Filed Weights   01/12/21 0343  Weight: 98.4 kg    Exam:  GEN: NAD SKIN: No rash EYES: EOMI ENT: MMM CV: RRR PULM: CTA B ABD: soft, ND, NT, +BS CNS: AAO x 3, non focal EXT: No edema or tenderness         Data Reviewed:   I have personally reviewed following labs and imaging studies:  Labs: Labs show the following:   Basic Metabolic Panel: Recent Labs  Lab 01/10/21 1626 01/10/21 1951 01/11/21 0449 01/11/21 1559 01/12/21 0422  NA 124* 123* 126* 126* 129*  K 2.3* 2.5* 2.3* 2.5* 3.3*  CL 74* 73* 79* 83* 85*  CO2 35* 34* 31 33* 31  GLUCOSE 148* 174* 154* 171* 175*  BUN 69* 71* 61* 43* 30*  CREATININE 1.29* 1.30* 1.22 1.12 0.89  CALCIUM 9.9 9.7 9.7 8.9 9.2  MG  --  2.8*  --   --  2.6*   GFR Estimated Creatinine Clearance: 81.6 mL/min (by C-G formula based on SCr of 0.89 mg/dL). Liver Function Tests: Recent Labs  Lab 01/10/21 1951  AST 24  ALT 22  ALKPHOS 45  BILITOT 0.8  PROT 7.5  ALBUMIN 4.2   No results for input(s): LIPASE, AMYLASE in the last 168 hours. No results for input(s): AMMONIA in the last 168 hours. Coagulation profile No results for input(s): INR, PROTIME in the last 168 hours.  CBC: Recent Labs  Lab 01/10/21 1951 01/12/21 0625  WBC 11.5* 11.3*  NEUTROABS 9.1* 8.8*  HGB 12.8* 11.9*  HCT 35.6* 33.2*  MCV 84.4 88.1  PLT 238 188   Cardiac Enzymes: No results for input(s): CKTOTAL, CKMB, CKMBINDEX, TROPONINI in the last 168 hours. BNP (last 3 results) Recent Labs    04/01/20 1509  PROBNP 5,001*   CBG: Recent Labs  Lab 01/11/21 0757 01/11/21 1212 01/11/21 1553 01/11/21 2030 01/12/21 0601  GLUCAP 181* 210* 159* 196* 152*   D-Dimer: No results for input(s): DDIMER in the last 72 hours. Hgb A1c: Recent Labs    01/11/21 0440  HGBA1C 6.7*   Lipid Profile: No results for input(s): CHOL, HDL, LDLCALC, TRIG, CHOLHDL, LDLDIRECT in the last 72 hours. Thyroid function studies: No results for input(s): TSH, T4TOTAL, T3FREE, THYROIDAB in the last 72 hours.  Invalid input(s): FREET3 Anemia work up: No results for input(s): VITAMINB12, FOLATE, FERRITIN, TIBC, IRON, RETICCTPCT in the last 72 hours. Sepsis Labs: Recent Labs  Lab  01/10/21 1951 01/12/21 0625  WBC 11.5* 11.3*    Microbiology Recent Results (from the past 240 hour(s))  Resp Panel by RT-PCR (Flu A&B, Covid) Nasopharyngeal Swab     Status: None   Collection Time: 01/10/21  8:48 PM   Specimen: Nasopharyngeal Swab; Nasopharyngeal(NP) swabs in vial transport medium  Result Value Ref Range Status  SARS Coronavirus 2 by RT PCR NEGATIVE NEGATIVE Final    Comment: (NOTE) SARS-CoV-2 target nucleic acids are NOT DETECTED.  The SARS-CoV-2 RNA is generally detectable in upper respiratory specimens during the acute phase of infection. The lowest concentration of SARS-CoV-2 viral copies this assay can detect is 138 copies/mL. A negative result does not preclude SARS-Cov-2 infection and should not be used as the sole basis for treatment or other patient management decisions. A negative result may occur with  improper specimen collection/handling, submission of specimen other than nasopharyngeal swab, presence of viral mutation(s) within the areas targeted by this assay, and inadequate number of viral copies(<138 copies/mL). A negative result must be combined with clinical observations, patient history, and epidemiological information. The expected result is Negative.  Fact Sheet for Patients:  BloggerCourse.com  Fact Sheet for Healthcare Providers:  SeriousBroker.it  This test is no t yet approved or cleared by the [place] FDA and  has been authorized for detection and/or diagnosis of SARS-CoV-2 by FDA under an Emergency Use Authorization (EUA). This EUA will remain  in effect (meaning this test can be used) for the duration of the COVID-19 declaration under Section 564(b)(1) of the Act, 21 U.S.C.section 360bbb-3(b)(1), unless the authorization is terminated  or revoked sooner.       Influenza A by PCR NEGATIVE NEGATIVE Final   Influenza B by PCR NEGATIVE NEGATIVE Final    Comment:  (NOTE) The Xpert Xpress SARS-CoV-2/FLU/RSV plus assay is intended as an aid in the diagnosis of influenza from Nasopharyngeal swab specimens and should not be used as a sole basis for treatment. Nasal washings and aspirates are unacceptable for Xpert Xpress SARS-CoV-2/FLU/RSV testing.  Fact Sheet for Patients: BloggerCourse.com  Fact Sheet for Healthcare Providers: SeriousBroker.it  This test is not yet approved or cleared by the [place] FDA and has been authorized for detection and/or diagnosis of SARS-CoV-2 by FDA under an Emergency Use Authorization (EUA). This EUA will remain in effect (meaning this test can be used) for the duration of the COVID-19 declaration under Section 564(b)(1) of the Act, 21 U.S.C. section 360bbb-3(b)(1), unless the authorization is terminated or revoked.  Performed at [facility], [address], [place], [place] [address]     Procedures and diagnostic studies:  No results found.             LOS: 1 day   Shawntae Lowy  [facility]   Pager on [email or web address]. If 7PM-7AM, please contact night-coverage at [email or web address]     01/12/2021, 10:17 AM

## 2021-01-12 NOTE — Progress Notes (Signed)
CARDIAC REHAB PHASE I   PRE:  Rate/Rhythm: 65 SR  BP:  Sitting: 110/66      SaO2: 97 RA  MODE:  Ambulation: 270 ft   POST:  Rate/Rhythm: 78 SR  BP:  Sitting: 122/67    SaO2: 92 RA   Pt ambulated 252ft in hallway assist of one with front wheel walker and gait belt. Pt took several short standing rest breaks c/o fatigue. Pt returned to bed. Encouraged continued ambulation. Will continue to follow.  [id number] Reynold Bowen, RN BSN 01/12/2021 11:03 AM

## 2021-01-13 ENCOUNTER — Inpatient Hospital Stay (HOSPITAL_COMMUNITY): Payer: Medicare Other

## 2021-01-13 ENCOUNTER — Encounter (HOSPITAL_COMMUNITY): Payer: Self-pay

## 2021-01-13 DIAGNOSIS — N179 Acute kidney failure, unspecified: Secondary | ICD-10-CM | POA: Diagnosis not present

## 2021-01-13 DIAGNOSIS — E871 Hypo-osmolality and hyponatremia: Secondary | ICD-10-CM | POA: Diagnosis not present

## 2021-01-13 DIAGNOSIS — E876 Hypokalemia: Secondary | ICD-10-CM | POA: Diagnosis not present

## 2021-01-13 DIAGNOSIS — I5042 Chronic combined systolic (congestive) and diastolic (congestive) heart failure: Secondary | ICD-10-CM | POA: Diagnosis not present

## 2021-01-13 LAB — COOXEMETRY PANEL
Carboxyhemoglobin: 1.8 % — ABNORMAL HIGH (ref 0.5–1.5)
Methemoglobin: 0.9 % (ref 0.0–1.5)
O2 Saturation: 64.7 %
Total hemoglobin: 11.1 g/dL — ABNORMAL LOW (ref 12.0–16.0)

## 2021-01-13 LAB — CBC WITH DIFFERENTIAL/PLATELET
Abs Immature Granulocytes: 0.13 10*3/uL — ABNORMAL HIGH (ref 0.00–0.07)
Basophils Absolute: 0 10*3/uL (ref 0.0–0.1)
Basophils Relative: 0 %
Eosinophils Absolute: 0.1 10*3/uL (ref 0.0–0.5)
Eosinophils Relative: 1 %
HCT: 31.3 % — ABNORMAL LOW (ref 39.0–52.0)
Hemoglobin: 10.7 g/dL — ABNORMAL LOW (ref 13.0–17.0)
Immature Granulocytes: 1 %
Lymphocytes Relative: 10 %
Lymphs Abs: 1.3 10*3/uL (ref 0.7–4.0)
MCH: 30.7 pg (ref 26.0–34.0)
MCHC: 34.2 g/dL (ref 30.0–36.0)
MCV: 89.7 fL (ref 80.0–100.0)
Monocytes Absolute: 1.1 10*3/uL — ABNORMAL HIGH (ref 0.1–1.0)
Monocytes Relative: 9 %
Neutro Abs: 9.9 10*3/uL — ABNORMAL HIGH (ref 1.7–7.7)
Neutrophils Relative %: 79 %
Platelets: 175 10*3/uL (ref 150–400)
RBC: 3.49 MIL/uL — ABNORMAL LOW (ref 4.22–5.81)
RDW: 17.5 % — ABNORMAL HIGH (ref 11.5–15.5)
WBC: 12.4 10*3/uL — ABNORMAL HIGH (ref 4.0–10.5)
nRBC: 0 % (ref 0.0–0.2)

## 2021-01-13 LAB — URINALYSIS, COMPLETE (UACMP) WITH MICROSCOPIC
Bacteria, UA: NONE SEEN
Bilirubin Urine: NEGATIVE
Glucose, UA: >=500 mg/dL — AB
Hgb urine dipstick: NEGATIVE
Ketones, ur: NEGATIVE mg/dL
Leukocytes,Ua: NEGATIVE
Nitrite: NEGATIVE
Protein, ur: NEGATIVE mg/dL
Specific Gravity, Urine: 1.006 (ref 1.005–1.030)
pH: 7 (ref 5.0–8.0)

## 2021-01-13 LAB — BASIC METABOLIC PANEL
Anion gap: 11 (ref 5–15)
Anion gap: 8 (ref 5–15)
BUN: 15 mg/dL (ref 8–23)
BUN: 15 mg/dL (ref 8–23)
CO2: 27 mmol/L (ref 22–32)
CO2: 28 mmol/L (ref 22–32)
Calcium: 9.1 mg/dL (ref 8.9–10.3)
Calcium: 8.4 mg/dL — ABNORMAL LOW (ref 8.9–10.3)
Chloride: 88 mmol/L — ABNORMAL LOW (ref 98–111)
Chloride: 92 mmol/L — ABNORMAL LOW (ref 98–111)
Creatinine, Ser: 0.85 mg/dL (ref 0.61–1.24)
Creatinine, Ser: 0.78 mg/dL (ref 0.61–1.24)
GFR, Estimated: >60 mL/min (ref >60)
GFR, Estimated: >60 mL/min (ref >60)
Glucose, Bld: 172 mg/dL — ABNORMAL HIGH (ref 70–99)
Glucose, Bld: 177 mg/dL — ABNORMAL HIGH (ref 70–99)
Potassium: 4.7 mmol/L (ref 3.5–5.1)
Potassium: 3.6 mmol/L (ref 3.5–5.1)
Sodium: 126 mmol/L — ABNORMAL LOW (ref 135–145)
Sodium: 128 mmol/L — ABNORMAL LOW (ref 135–145)

## 2021-01-13 LAB — MAGNESIUM: Magnesium: 2.3 mg/dL (ref 1.7–2.4)

## 2021-01-13 LAB — GLUCOSE, CAPILLARY
Glucose-Capillary: 181 mg/dL — ABNORMAL HIGH (ref 70–99)
Glucose-Capillary: 194 mg/dL — ABNORMAL HIGH (ref 70–99)
Glucose-Capillary: 196 mg/dL — ABNORMAL HIGH (ref 70–99)
Glucose-Capillary: 181 mg/dL — ABNORMAL HIGH (ref 70–99)

## 2021-01-13 MED ORDER — FUROSEMIDE 10 MG/ML IJ SOLN
80.0000 mg | Freq: Two times a day (BID) | INTRAMUSCULAR | Status: AC
  Administered 2021-01-13 (×2): 80 mg via INTRAVENOUS
  Filled 2021-01-13 (×2): qty 8

## 2021-01-13 MED ORDER — POTASSIUM CHLORIDE CRYS ER 20 MEQ PO TBCR
20.0000 meq | EXTENDED_RELEASE_TABLET | Freq: Once | ORAL | Status: AC
  Administered 2021-01-13: 20 meq via ORAL
  Filled 2021-01-13: qty 1

## 2021-01-13 MED ORDER — LOSARTAN POTASSIUM 25 MG PO TABS
12.5000 mg | ORAL_TABLET | Freq: Every day | ORAL | Status: DC
  Administered 2021-01-13: 12.5 mg via ORAL
  Filled 2021-01-13: qty 1

## 2021-01-13 NOTE — Progress Notes (Addendum)
Advanced Heart Failure Rounding Note  PCP-Cardiologist: Werner Lean, MD  HF clinic: Dr. Aundra Dubin  Subjective:    Admitted with marked hypokalemia after taking 500 mg torsemide twice daily.   Potassium corrected. Holding diuretic. Remains on milrinone 0.25 mcg which is his home dose.   CVP 13-14  Low grade temp (100.63F) last night. WBC trending up.  More dyspnea last 24 hrs. Reports difficulty sleeping. No CP.  Objective:   Weight Range: 90.9 kg Body mass index is 30.49 kg/m.   Vital Signs:   Temp:  [98.3 F (36.8 C)-100.2 F (37.9 C)] 98.3 F (36.8 C) (08/04 0728) Pulse Rate:  [67-83] 83 (08/03 1926) Resp:  [19-21] 20 (08/04 0200) BP: (96-118)/(52-71) 100/64 (08/04 0039) SpO2:  [90 %-98 %] 97 % (08/03 1950) FiO2 (%):  [21 %] 21 % (08/03 0818) Weight:  [90.9 kg] 90.9 kg (08/04 0500) Last BM Date: 01/12/21  Weight change: Filed Weights   01/12/21 0343 01/13/21 0039 01/13/21 0500  Weight: 98.4 kg 90.9 kg 90.9 kg    Intake/Output:   Intake/Output Summary (Last 24 hours) at 01/13/2021 0753 Last data filed at 01/13/2021 1610 Gross per 24 hour  Intake 1253.54 ml  Output 2225 ml  Net -971.46 ml      Physical Exam   CVP 13-14 General:  Well appearing. No resp difficulty HEENT: Normal Neck: Supple. JVP 13-14 Carotids 2+ bilat; no bruits. No lymphadenopathy or thyromegaly appreciated. Cor: PMI nondisplaced. Regular rate & rhythm. No rubs, gallops or murmurs. Lungs: Clear Abdomen: Soft, nontender, nondistended. No hepatosplenomegaly. No bruits or masses. Good bowel sounds. Extremities: No cyanosis, clubbing, rash, edema. RUE PICC Neuro: Alert & orientedx3, cranial nerves grossly intact. moves all 4 extremities w/o difficulty. Affect pleasant   Telemetry   Sinus 1st degree AVB, 70s  EKG    N/A  Labs    CBC Recent Labs    01/12/21 0625 01/13/21 0425  WBC 11.3* 12.4*  NEUTROABS 8.8* 9.9*  HGB 11.9* 10.7*  HCT 33.2* 31.3*  MCV 88.1 89.7   PLT 188 960   Basic Metabolic Panel Recent Labs    01/12/21 0422 01/13/21 0425  NA 129* 126*  K 3.3* 4.7  CL 85* 88*  CO2 31 27  GLUCOSE 175* 172*  BUN 30* 15  CREATININE 0.89 0.85  CALCIUM 9.2 9.1  MG 2.6* 2.3   Liver Function Tests Recent Labs    01/10/21 1951  AST 24  ALT 22  ALKPHOS 45  BILITOT 0.8  PROT 7.5  ALBUMIN 4.2   No results for input(s): LIPASE, AMYLASE in the last 72 hours. Cardiac Enzymes No results for input(s): CKTOTAL, CKMB, CKMBINDEX, TROPONINI in the last 72 hours.  BNP: BNP (last 3 results) Recent Labs    09/28/20 1613  BNP 3,328.4*    ProBNP (last 3 results) Recent Labs    04/01/20 1509  PROBNP 5,001*     D-Dimer No results for input(s): DDIMER in the last 72 hours. Hemoglobin A1C Recent Labs    01/11/21 0440  HGBA1C 6.7*   Fasting Lipid Panel No results for input(s): CHOL, HDL, LDLCALC, TRIG, CHOLHDL, LDLDIRECT in the last 72 hours. Thyroid Function Tests No results for input(s): TSH, T4TOTAL, T3FREE, THYROIDAB in the last 72 hours.  Invalid input(s): FREET3  Other results:   Imaging    No results found.   Medications:     Scheduled Medications:  amiodarone  200 mg Oral Daily   apixaban  5 mg Oral BID  atorvastatin  20 mg Oral Daily   Chlorhexidine Gluconate Cloth  6 each Topical Daily   dapagliflozin propanediol  10 mg Oral Daily   digoxin  0.125 mg Oral Daily   escitalopram  10 mg Oral Daily   insulin aspart  0-5 Units Subcutaneous QHS   insulin aspart  0-9 Units Subcutaneous TID WC   mometasone-formoterol  2 puff Inhalation BID   sodium chloride flush  3 mL Intravenous Q12H   spironolactone  25 mg Oral Daily    Infusions:  milrinone 0.25 mcg/kg/min (01/13/21 0609)    PRN Medications: acetaminophen **OR** acetaminophen   Assessment/Plan   Hypokalemia: -Corrected. -Back on spiro 25 mg daily. 2. Chronic systolic CHF:  -Long-standing NICM, followed in the past in [place].  Has MDT ICD.   Echo 09/28/20 with EF <20%, severe LV dilation, mild RV dilation with moderate RV dysfunction, mild-moderate MR, moderate-severe TR, severe biatrial enlargement, no more than moderate AS.  Last cath in 2/22 showed nonobstructive mild CAD, elevated right and left heart filling pressures with low CI 2.04; AS no more than moderate.  Cause of cardiomyopathy uncertain, he is generally in NSR so unlikely to be tachy-mediated CMP.  Possible remote viral infection/viral myocarditis. NYHA class IV at 4/22 admission.  He was diuresed on milrinone, then milrinone stopped with drop in co-ox to 49%.  Milrinone restarted at 0.25, co-ox back up to 68% and discharged home on milrinone.    -Co-ox stable at 64%. Continue milrinone 0.25, he is milrinone dependent due to low output HF.  Continuing milrinone long-term is not a great option for him given recurrent VT/VF with ICD shocks. - NYHA IV. Appears volume up. Weight yesterday likely not accurate. Up 4 lb from admit. CVP 13-14.  Diuretic has been on hold. More dyspneic last 24 hrs. Will give furosemide 80 mg IV BID.  Low grade temp last night (100.2 F), WBC 12.4. Will get chest x-ray and blood cultures given presence of PICC line.  -Continue spiro 25 mg daily -Continue dig 0.125 mg, dig level 0.6 -Continue Farxiga 10 mg daily -Adding losartan 12.5 mg daily, would ideally like to transition to entresto prior to D/C if BP allows -CR -He has low output HF.  RV function on RHC appeared adequate with PAPi 4.1, creatinine stable.  We have been discussing LVAD and he has had full workup, he has met an LVAD patient and was able to get his questions about LVAD answered.  He wanted to go home with milrinone to give him a break from the hospital.  We have had discussions about the limitations of milrinone and about how LVAD is really the only good option at this point to reduce mortality, this was reiterated this admission.   He still is not sure what he wants to do.  Fortunately, he  has had no further VT.  He asks about any other medical options.  Unfortunately, they are few. We can try him on omecamtiv when it is available.  Barostimulation activation therapy has not been studied in patients with end stage CHF on inotropes. 3. Leukocytosis/low-grade fever: -Chest x-ray -Blood cultures -See discussion in #2 4. Atrial fibrillation:  -Paroxysmal.  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 Off Tikosyn after VF on 4/22.  Did not tolerate amiodarone in the past but doing well on it so far. - Continue Eliquis. 5. Aortic stenosis:  -Based on full evaluation (cath and echo), suspect no more than moderate aortic stenosis.  Per Dr  MCLean - he would not benefit from TAVR at this point. 6. CAD:  -Cath 2/22 with nonobstructive disease. -Continue statin -No chest pain.  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  Milrinone decreased to 0.25.  Tikosyn stopped and amiodarone started. No VT since 5/22.  Risk of recurrent VT/VF will be high as long as he is on milrinone. - Continue amio 200 mg daily. 8. Hyponatremia:  -History of hypervolemic hyponatremia. -Sodium 126>129>126 -May need tolvaptan if continues to trend down with diuresis -Keep fluid restricted. 9. Hx elevated LFTs:   -LFTs have normalized   DISCHARGE PLANNING -HH F2F completed for continuation of home milrinone 0.25 mcg. [facility] [facility] has been following and aware of admit.  -Anticipate discharge 08/05 or 08/06  Length of Stay: 2  FINCH, LINDSAY N, PA-C  01/13/2021, 7:53 AM  Advanced Heart Failure Team Pager [phone] (M-F; 7a - 5p)  Please contact [facility] for night-coverage after hours (5p -7a ) and weekends on [email or web address]  Patient seen with PA, agree with the above note.   Patient had low grade fever to 100.2 last night, WBCs 12.  Feels more short of breath today.  SBP 100s.  CVP 12-13 with co-ox 65%.   General: NAD Neck: JVP 12 cm, no thyromegaly or thyroid nodule.  Lungs: Clear  to auscultation bilaterally with normal respiratory effort. CV: Lateral PMI.  Heart regular S1/S2, no S3/S4, no murmur.  No peripheral edema.   Abdomen: Soft, nontender, no hepatosplenomegaly, no distention.  Skin: Intact without lesions or rashes.  Neurologic: Alert and oriented x 3.  Psych: Normal affect. Extremities: No clubbing or cyanosis.  HEENT: Normal.   Low grade fever, will send blood and urine cultures, UA, and CXR.  Has PICC line, risk of infection.   Volume overloaded after holding diuretic for a couple of days.  Lasix 80 mg IV bid today and check pm BMET for K.  Hopefully can restart torsemide 100 mg bid tomorrow and possibly to home.   Loralie Champagne 01/13/2021 11:00 AM

## 2021-01-13 NOTE — Progress Notes (Signed)
Patient has home CPAP at bedside.  RT available if assistance is needed.

## 2021-01-13 NOTE — Progress Notes (Signed)
Progress Note    Chad Wilkerson  YQM:[id number] DOB: [birth year]  DOA: 01/10/2021 PCP: [name], NP      Brief Narrative:    Medical records reviewed and are as summarized below:  Chad Wilkerson is a 75 y.o. male with past medical history significant for chronic combined systolic and diastolic CHF (EF less than 20%, grade 3 diastolic dysfunction), paroxysmal atrial fibrillation, aortic stenosis, hypertension, obstructive sleep apnea, morbid obesity, who presented to the hospital because of abnormal labs.      Assessment/Plan:   Principal Problem:   AKI (acute kidney injury) (HCC) Active Problems:   Sleep apnea   Essential hypertension   Diabetes mellitus without complication (HCC)   Paroxysmal atrial fibrillation (HCC)   Morbid obesity (HCC)   ICD (implantable cardioverter-defibrillator) in place   Hypokalemia   Chronic combined systolic and diastolic CHF (congestive heart failure) (HCC)   Hyponatremia     Body mass index is 33.55 kg/m.  (Obesity)   Low-grade fever: Chest x-ray done today did not show any pneumonia.  Blood cultures have been ordered because he has a PICC line.  AKI: Improved.  This is likely prerenal from overdiuresis.  Hypovolemic hyponatremia: Sodium level is down again to 126.  Of note, sodium was 126 on 10/09/2020.  Continue to monitor.  Hypokalemia: Improved  Acute exacerbation of chronic combined systolic and diastolic CHF, ICD in place: IV Lasix was ordered today because of increasing shortness of breath. Continue IV milrinone infusion per cardiologist.  EF is less than 20%.  Torsemide on hold because of recent overdiuresis.  Continue dapagliflozin.  Paroxysmal atrial fibrillation: Continue Eliquis and amiodarone.  Off of Tikosyn after V. fib in April 2022 that was terminated with ICD shock  Other comorbidities include obstructive sleep apnea, type II DM, hypertension, CAD    Diet Order             Diet Heart Room  service appropriate? Yes; Fluid consistency: Thin  Diet effective now                      Consultants: Cardiologist  Procedures: None    Medications:    amiodarone  200 mg Oral Daily   apixaban  5 mg Oral BID   atorvastatin  20 mg Oral Daily   Chlorhexidine Gluconate Cloth  6 each Topical Daily   dapagliflozin propanediol  10 mg Oral Daily   digoxin  0.125 mg Oral Daily   escitalopram  10 mg Oral Daily   furosemide  80 mg Intravenous BID   insulin aspart  0-5 Units Subcutaneous QHS   insulin aspart  0-9 Units Subcutaneous TID WC   losartan  12.5 mg Oral Daily   mometasone-formoterol  2 puff Inhalation BID   sodium chloride flush  3 mL Intravenous Q12H   spironolactone  25 mg Oral Daily   Continuous Infusions:  milrinone 0.25 mcg/kg/min (01/13/21 0609)     Anti-infectives (From admission, onward)    None              Family Communication/Anticipated D/C date and plan/Code Status   DVT prophylaxis: SCDs Start: 01/11/21 0410 apixaban (ELIQUIS) tablet 5 mg     Code Status: Full Code  Family Communication: None Disposition Plan:    Status is: Inpatient  Remains inpatient appropriate because:Inpatient level of care appropriate due to severity of illness  Dispo: The patient is from: Home  Anticipated d/c is to: Home              Patient currently is not medically stable to d/c.   Difficult to place patient No           Subjective:   Interval events noted.  C/o shortness of breath.  He had low-grade fever with temperature 100.2 F this morning.  Objective:    Vitals:   01/13/21 0933 01/13/21 0952 01/13/21 1003 01/13/21 1007  BP:    114/80  Pulse:   70 67  Resp:    17  Temp:      TempSrc:      SpO2: 94%     Weight:  100.1 kg     No data found.   Intake/Output Summary (Last 24 hours) at 01/13/2021 1353 Last data filed at 01/13/2021 1230 Gross per 24 hour  Intake 1973.54 ml  Output 2950 ml  Net -976.46 ml    Filed Weights   01/13/21 0039 01/13/21 0500 01/13/21 0952  Weight: 90.9 kg 90.9 kg 100.1 kg    Exam:  GEN: NAD SKIN: Warm and dry.  PICC line right arm without any drainage or erythema at entry site. EYES: No pallor or icterus ENT: MMM CV: RRR PULM: CTA B ABD: soft, ND, NT, +BS CNS: AAO x 3, non focal EXT: No edema or tenderness         Data Reviewed:   I have personally reviewed following labs and imaging studies:  Labs: Labs show the following:   Basic Metabolic Panel: Recent Labs  Lab 01/10/21 1951 01/11/21 0449 01/11/21 1559 01/12/21 0422 01/13/21 0425  NA 123* 126* 126* 129* 126*  K 2.5* 2.3* 2.5* 3.3* 4.7  CL 73* 79* 83* 85* 88*  CO2 34* 31 33* 31 27  GLUCOSE 174* 154* 171* 175* 172*  BUN 71* 61* 43* 30* 15  CREATININE 1.30* 1.22 1.12 0.89 0.85  CALCIUM 9.7 9.7 8.9 9.2 9.1  MG 2.8*  --   --  2.6* 2.3   GFR Estimated Creatinine Clearance: 86.1 mL/min (by C-G formula based on SCr of 0.85 mg/dL). Liver Function Tests: Recent Labs  Lab 01/10/21 1951  AST 24  ALT 22  ALKPHOS 45  BILITOT 0.8  PROT 7.5  ALBUMIN 4.2   No results for input(s): LIPASE, AMYLASE in the last 168 hours. No results for input(s): AMMONIA in the last 168 hours. Coagulation profile No results for input(s): INR, PROTIME in the last 168 hours.  CBC: Recent Labs  Lab 01/10/21 1951 01/12/21 0625 01/13/21 0425  WBC 11.5* 11.3* 12.4*  NEUTROABS 9.1* 8.8* 9.9*  HGB 12.8* 11.9* 10.7*  HCT 35.6* 33.2* 31.3*  MCV 84.4 88.1 89.7  PLT 238 188 175   Cardiac Enzymes: No results for input(s): CKTOTAL, CKMB, CKMBINDEX, TROPONINI in the last 168 hours. BNP (last 3 results) Recent Labs    04/01/20 1509  PROBNP 5,001*   CBG: Recent Labs  Lab 01/12/21 1150 01/12/21 1536 01/12/21 2045 01/13/21 0423 01/13/21 1131  GLUCAP 237* 168* 170* 181* 194*   D-Dimer: No results for input(s): DDIMER in the last 72 hours. Hgb A1c: Recent Labs    01/11/21 0440  HGBA1C 6.7*    Lipid Profile: No results for input(s): CHOL, HDL, LDLCALC, TRIG, CHOLHDL, LDLDIRECT in the last 72 hours. Thyroid function studies: No results for input(s): TSH, T4TOTAL, T3FREE, THYROIDAB in the last 72 hours.  Invalid input(s): FREET3 Anemia work up: No results for input(s): VITAMINB12, FOLATE, FERRITIN, TIBC,  IRON, RETICCTPCT in the last 72 hours. Sepsis Labs: Recent Labs  Lab 01/10/21 1951 01/12/21 0625 01/13/21 0425  WBC 11.5* 11.3* 12.4*    Microbiology Recent Results (from the past 240 hour(s))  Resp Panel by RT-PCR (Flu A&B, Covid) Nasopharyngeal Swab     Status: None   Collection Time: 01/10/21  8:48 PM   Specimen: Nasopharyngeal Swab; Nasopharyngeal(NP) swabs in vial transport medium  Result Value Ref Range Status   SARS Coronavirus 2 by RT PCR NEGATIVE NEGATIVE Final    Comment: (NOTE) SARS-CoV-2 target nucleic acids are NOT DETECTED.  The SARS-CoV-2 RNA is generally detectable in upper respiratory specimens during the acute phase of infection. The lowest concentration of SARS-CoV-2 viral copies this assay can detect is 138 copies/mL. A negative result does not preclude SARS-Cov-2 infection and should not be used as the sole basis for treatment or other patient management decisions. A negative result may occur with  improper specimen collection/handling, submission of specimen other than nasopharyngeal swab, presence of viral mutation(s) within the areas targeted by this assay, and inadequate number of viral copies(<138 copies/mL). A negative result must be combined with clinical observations, patient history, and epidemiological information. The expected result is Negative.  Fact Sheet for Patients:  BloggerCourse.com  Fact Sheet for Healthcare Providers:  SeriousBroker.it  This test is no t yet approved or cleared by the [place] FDA and  has been authorized for detection and/or diagnosis of  SARS-CoV-2 by FDA under an Emergency Use Authorization (EUA). This EUA will remain  in effect (meaning this test can be used) for the duration of the COVID-19 declaration under Section 564(b)(1) of the Act, 21 U.S.C.section 360bbb-3(b)(1), unless the authorization is terminated  or revoked sooner.       Influenza A by PCR NEGATIVE NEGATIVE Final   Influenza B by PCR NEGATIVE NEGATIVE Final    Comment: (NOTE) The Xpert Xpress SARS-CoV-2/FLU/RSV plus assay is intended as an aid in the diagnosis of influenza from Nasopharyngeal swab specimens and should not be used as a sole basis for treatment. Nasal washings and aspirates are unacceptable for Xpert Xpress SARS-CoV-2/FLU/RSV testing.  Fact Sheet for Patients: BloggerCourse.com  Fact Sheet for Healthcare Providers: SeriousBroker.it  This test is not yet approved or cleared by the [place] FDA and has been authorized for detection and/or diagnosis of SARS-CoV-2 by FDA under an Emergency Use Authorization (EUA). This EUA will remain in effect (meaning this test can be used) for the duration of the COVID-19 declaration under Section 564(b)(1) of the Act, 21 U.S.C. section 360bbb-3(b)(1), unless the authorization is terminated or revoked.  Performed at [facility], [address] [place], [place] [address]     Procedures and diagnostic studies:  DG Chest [facility] 1 View  Result Date: 01/13/2021 CLINICAL DATA:  Shortness of breath.  Leukocytosis. EXAM: PORTABLE CHEST 1 VIEW COMPARISON:  09/28/2020 FINDINGS: Chronically enlarged cardiac silhouette. Chronic aortic atherosclerosis. 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 Right arm PICC tip in the SVC at the azygos level. Allowing for the portable technique, the lungs are clear. No consolidation or collapse. There could possibly be pulmonary venous hypertension. IMPRESSION: No significant change. Cardiomegaly.  Pacemaker/AICD. Right arm PICC tip at the as a gas level of the SVC. No sign of pneumonia or collapse. Electronically Signed   By: Paulina Fusi M.D.   On: 01/13/2021 12:21               LOS: 2 days   Gertha Lichtenberg  [facility]  Pager on [email or web address]. If 7PM-7AM, please contact night-coverage at [email or web address]     01/13/2021, 1:53 PM

## 2021-01-14 DIAGNOSIS — E876 Hypokalemia: Secondary | ICD-10-CM | POA: Diagnosis not present

## 2021-01-14 DIAGNOSIS — N179 Acute kidney failure, unspecified: Secondary | ICD-10-CM | POA: Diagnosis not present

## 2021-01-14 DIAGNOSIS — E871 Hypo-osmolality and hyponatremia: Secondary | ICD-10-CM | POA: Diagnosis not present

## 2021-01-14 DIAGNOSIS — I5042 Chronic combined systolic (congestive) and diastolic (congestive) heart failure: Secondary | ICD-10-CM | POA: Diagnosis not present

## 2021-01-14 LAB — CBC WITH DIFFERENTIAL/PLATELET
Abs Immature Granulocytes: 0.05 10*3/uL (ref 0.00–0.07)
Basophils Absolute: 0 10*3/uL (ref 0.0–0.1)
Basophils Relative: 0 %
Eosinophils Absolute: 0.1 10*3/uL (ref 0.0–0.5)
Eosinophils Relative: 1 %
HCT: 29.1 % — ABNORMAL LOW (ref 39.0–52.0)
Hemoglobin: 10.3 g/dL — ABNORMAL LOW (ref 13.0–17.0)
Immature Granulocytes: 1 %
Lymphocytes Relative: 14 %
Lymphs Abs: 1.4 10*3/uL (ref 0.7–4.0)
MCH: 31.4 pg (ref 26.0–34.0)
MCHC: 35.4 g/dL (ref 30.0–36.0)
MCV: 88.7 fL (ref 80.0–100.0)
Monocytes Absolute: 0.7 10*3/uL (ref 0.1–1.0)
Monocytes Relative: 7 %
Neutro Abs: 7.7 10*3/uL (ref 1.7–7.7)
Neutrophils Relative %: 77 %
Platelets: 162 10*3/uL (ref 150–400)
RBC: 3.28 MIL/uL — ABNORMAL LOW (ref 4.22–5.81)
RDW: 17.4 % — ABNORMAL HIGH (ref 11.5–15.5)
WBC: 10 10*3/uL (ref 4.0–10.5)
nRBC: 0 % (ref 0.0–0.2)

## 2021-01-14 LAB — COOXEMETRY PANEL
Carboxyhemoglobin: 1.8 % — ABNORMAL HIGH (ref 0.5–1.5)
Carboxyhemoglobin: 1.9 % — ABNORMAL HIGH (ref 0.5–1.5)
Methemoglobin: 0.8 % (ref 0.0–1.5)
Methemoglobin: 1 % (ref 0.0–1.5)
O2 Saturation: 46.6 %
O2 Saturation: 54.3 %
Total hemoglobin: 10.6 g/dL — ABNORMAL LOW (ref 12.0–16.0)
Total hemoglobin: 10.4 g/dL — ABNORMAL LOW (ref 12.0–16.0)

## 2021-01-14 LAB — BASIC METABOLIC PANEL
Anion gap: 8 (ref 5–15)
BUN: 14 mg/dL (ref 8–23)
CO2: 30 mmol/L (ref 22–32)
Calcium: 8.2 mg/dL — ABNORMAL LOW (ref 8.9–10.3)
Chloride: 90 mmol/L — ABNORMAL LOW (ref 98–111)
Creatinine, Ser: 0.81 mg/dL (ref 0.61–1.24)
GFR, Estimated: >60 mL/min (ref >60)
Glucose, Bld: 204 mg/dL — ABNORMAL HIGH (ref 70–99)
Potassium: 3.6 mmol/L (ref 3.5–5.1)
Sodium: 128 mmol/L — ABNORMAL LOW (ref 135–145)

## 2021-01-14 LAB — MAGNESIUM: Magnesium: 2.1 mg/dL (ref 1.7–2.4)

## 2021-01-14 LAB — URINE CULTURE: Culture: NO GROWTH

## 2021-01-14 LAB — GLUCOSE, CAPILLARY
Glucose-Capillary: 153 mg/dL — ABNORMAL HIGH (ref 70–99)
Glucose-Capillary: 242 mg/dL — ABNORMAL HIGH (ref 70–99)

## 2021-01-14 MED ORDER — TORSEMIDE 100 MG PO TABS
100.0000 mg | ORAL_TABLET | Freq: Two times a day (BID) | ORAL | Status: DC
  Administered 2021-01-14: 100 mg via ORAL
  Filled 2021-01-14: qty 1

## 2021-01-14 MED ORDER — POTASSIUM CHLORIDE CRYS ER 20 MEQ PO TBCR
40.0000 meq | EXTENDED_RELEASE_TABLET | Freq: Once | ORAL | Status: AC
  Administered 2021-01-14: 40 meq via ORAL
  Filled 2021-01-14: qty 2

## 2021-01-14 MED ORDER — POTASSIUM CHLORIDE CRYS ER 20 MEQ PO TBCR: 40.0000 meq | EXTENDED_RELEASE_TABLET | Freq: Two times a day (BID) | ORAL | Status: DC

## 2021-01-14 NOTE — TOC Transition Note (Signed)
Transition of Care [facility]) - CM/SW Discharge Note   Patient Details  Name: Chad Wilkerson MRN: [id number] Date of Birth: Dec 18, 1945  Transition [facility]) CM/SW Contact:  Leone Haven, RN Phone Number: 01/14/2021, 12:57 PM   Clinical Narrative:    Patient is for dc today, NCM notified Pearson Grippe and Jeri Modena of dc today.  Patient will be going home with iv milrinone.  Pam will be hooking patient up later this afternoon.  NCM nofied Engineer, drilling of this information.   Final next level of care: Home w Home Health Services Barriers to Discharge: No Barriers Identified   Patient Goals and CMS Choice Patient states their goals for this hospitalization and ongoing recovery are:: return home CMS Medicare.gov Compare Post Acute Care list provided to:: Patient Choice offered to / list presented to : Patient  Discharge Placement                       Discharge Plan and Services In-house Referral: Clinical Social Work Discharge Planning Services: CM Consult Post Acute Care Choice: Home Health            DME Agency: NA       HH Arranged: RN HH Agency: [facility] Date HH Agency Contacted: 01/11/21 Time HH Agency Contacted: 1557 Representative spoke with at [facility] Agency: Jeri Modena RN, Werner Lean  Social Determinants of Health (SDOH) Interventions Food Insecurity Interventions: Intervention Not Indicated Financial Strain Interventions: Intervention Not Indicated Housing Interventions: Intervention Not Indicated Transportation Interventions: Intervention Not Indicated   Readmission Risk Interventions No flowsheet data found.

## 2021-01-14 NOTE — Care Management Important Message (Signed)
Important Message  Patient Details  Name: Chad Wilkerson MRN: [id number] Date of Birth: 10-May-1946   Medicare Important Message Given:  Yes     Renie Ora 01/14/2021, 8:10 AM

## 2021-01-14 NOTE — TOC Progression Note (Addendum)
Transition of Care (TOC) - Progression Note  Heart Failure   Patient Details  Name: Chad Wilkerson MRN: [id number] Date of Birth: 02/06/1946  [facility]) CM/SW Contact  Renelda Kilian, LCSWA Phone Number: 01/14/2021, 11:54 AM  Clinical Narrative:    CSW spoke with the patient at bedside to bring him an appointment card for the [facility] outpatient clinic and encouraged him to follow up and to attend the appointment and bring his medications and if anything changes to please reach out so that CSW/HV clinic team can provide support.   CSW will sign off for now as social work intervention is no longer needed. Please consult [place] again if new needs arise.  Expected Discharge Plan: Home w Home Health Services Barriers to Discharge: Continued Medical Work up  Expected Discharge Plan and Services Expected Discharge Plan: Home w Home Health Services In-house Referral: Clinical Social Work Discharge Planning Services: CM Consult Post Acute Care Choice: Home Health Living arrangements for the past 2 months: Single Family Home                           HH Arranged: RN HH Agency: [facility] Date HH Agency Contacted: 01/11/21 Time HH Agency Contacted: 1557 Representative spoke with at [facility] Agency: Jeri Modena RN, Werner Lean   Social Determinants of Health (SDOH) Interventions Food Insecurity Interventions: Intervention Not Indicated Financial Strain Interventions: Intervention Not Indicated Housing Interventions: Intervention Not Indicated Transportation Interventions: Intervention Not Indicated  Readmission Risk Interventions No flowsheet data found.  Istvan Behar, MSW, LCSWA [phone] Heart Failure Social Worker

## 2021-01-14 NOTE — Progress Notes (Addendum)
Advanced Heart Failure Rounding Note  PCP-Cardiologist: Werner Lean, MD  HF clinic: Dr. Aundra Dubin  Subjective:    Admitted with marked hypokalemia after taking 500 mg torsemide twice daily.   Remains on milrinone 0.25 mcg which is his home dose. Co-Ox 46%, repeat lab pending.   CVP 9. Neg 3L last 24 hrs but not much change in weight. Dyspnea significantly improved.  No recurrent fever. WBC trending down. UA negative.   Objective:   Weight Range: 99.7 kg Body mass index is 33.42 kg/m.   Vital Signs:   Temp:  [97.6 F (36.4 C)-98.9 F (37.2 C)] 97.6 F (36.4 C) (08/05 0547) Pulse Rate:  [58-74] 74 (08/05 0547) Resp:  [16-20] 17 (08/05 0547) BP: (94-114)/(58-80) 96/63 (08/05 0547) SpO2:  [93 %-97 %] 96 % (08/05 0547) Weight:  [99.7 kg-100.1 kg] 99.7 kg (08/05 0547) Last BM Date: 01/13/21  Weight change: Filed Weights   01/13/21 0500 01/13/21 0952 01/14/21 0547  Weight: 90.9 kg 100.1 kg 99.7 kg    Intake/Output:   Intake/Output Summary (Last 24 hours) at 01/14/2021 0742 Last data filed at 01/14/2021 0400 Gross per 24 hour  Intake 1323 ml  Output 4225 ml  Net -2902 ml      Physical Exam   CVP 9-10 General:  Well appearing. No resp difficulty HEENT: Normal Neck: Supple. JVP 9-10 Carotids 2+ bilat; no bruits. No lymphadenopathy or thyromegaly appreciated. Cor: PMI nondisplaced. Regular rate & rhythm. No rubs, gallops or murmurs. Lungs: Clear Abdomen: Soft, nontender, nondistended. No hepatosplenomegaly. No bruits or masses. Good bowel sounds. Extremities: No cyanosis, clubbing, rash, edema. RUE PICC Neuro: Alert & orientedx3, cranial nerves grossly intact. moves all 4 extremities w/o difficulty. Affect pleasant   Telemetry   Sinus/sinus brady w/ 1st degree AVB, 50s-60s (personally reviewed)  EKG    N/A  Labs    CBC Recent Labs    01/13/21 0425 01/14/21 0421  WBC 12.4* 10.0  NEUTROABS 9.9* 7.7  HGB 10.7* 10.3*  HCT 31.3* 29.1*  MCV  89.7 88.7  PLT 175 509   Basic Metabolic Panel Recent Labs    01/13/21 0425 01/13/21 1617 01/14/21 0421  NA 126* 128* 128*  K 4.7 3.6 3.6  CL 88* 92* 90*  CO2 _0 GLUCOSE 172* 177* 204*  BUN _1 CREATININE 0.85 0.78 0.81  CALCIUM 9.1 8.4* 8.2*  MG 2.3  --  2.1   Liver Function Tests No results for input(s): AST, ALT, ALKPHOS, BILITOT, PROT, ALBUMIN in the last 72 hours.  No results for input(s): LIPASE, AMYLASE in the last 72 hours. Cardiac Enzymes No results for input(s): CKTOTAL, CKMB, CKMBINDEX, TROPONINI in the last 72 hours.  BNP: BNP (last 3 results) Recent Labs    09/28/20 1613  BNP 3,328.4*    ProBNP (last 3 results) Recent Labs    04/01/20 1509  PROBNP 5,001*     D-Dimer No results for input(s): DDIMER in the last 72 hours. Hemoglobin A1C No results for input(s): HGBA1C in the last 72 hours.  Fasting Lipid Panel No results for input(s): CHOL, HDL, LDLCALC, TRIG, CHOLHDL, LDLDIRECT in the last 72 hours. Thyroid Function Tests No results for input(s): TSH, T4TOTAL, T3FREE, THYROIDAB in the last 72 hours.  Invalid input(s): FREET3  Other results:   Imaging    DG Chest Port 1 View  Result Date: 01/13/2021 CLINICAL DATA:  Shortness of breath.  Leukocytosis. EXAM: PORTABLE CHEST 1 VIEW COMPARISON:  09/28/2020 FINDINGS:  Chronically enlarged cardiac silhouette. Chronic aortic atherosclerosis. 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 Right arm PICC tip in the SVC at the azygos level. Allowing for the portable technique, the lungs are clear. No consolidation or collapse. There could possibly be pulmonary venous hypertension. IMPRESSION: No significant change. Cardiomegaly. Pacemaker/AICD. Right arm PICC tip at the as a gas level of the SVC. No sign of pneumonia or collapse. Electronically Signed   By: Nelson Chimes M.D.   On: 01/13/2021 12:21     Medications:     Scheduled Medications:  amiodarone  200 mg Oral Daily   apixaban  5 mg Oral BID    atorvastatin  20 mg Oral Daily   Chlorhexidine Gluconate Cloth  6 each Topical Daily   dapagliflozin propanediol  10 mg Oral Daily   digoxin  0.125 mg Oral Daily   escitalopram  10 mg Oral Daily   insulin aspart  0-5 Units Subcutaneous QHS   insulin aspart  0-9 Units Subcutaneous TID WC   losartan  12.5 mg Oral Daily   mometasone-formoterol  2 puff Inhalation BID   potassium chloride  40 mEq Oral Once   sodium chloride flush  3 mL Intravenous Q12H   spironolactone  25 mg Oral Daily    Infusions:  milrinone 0.25 mcg/kg/min (01/13/21 2008)    PRN Medications: acetaminophen **OR** acetaminophen   Assessment/Plan   Hypokalemia: -Corrected. K now 3.6 after diuresis. Replace. Mag okay. -Back on spiro 25 mg daily. 2. Chronic systolic CHF:  -Long-standing NICM, followed in the past in [place].  Has MDT ICD.  Echo 09/28/20 with EF <20%, severe LV dilation, mild RV dilation with moderate RV dysfunction, mild-moderate MR, moderate-severe TR, severe biatrial enlargement, no more than moderate AS.  Last cath in 2/22 showed nonobstructive mild CAD, elevated right and left heart filling pressures with low CI 2.04; AS no more than moderate.  Cause of cardiomyopathy uncertain, he is generally in NSR so unlikely to be tachy-mediated CMP.  Possible remote viral infection/viral myocarditis. NYHA class IV at 4/22 admission.  He was diuresed on milrinone, then milrinone stopped with drop in co-ox to 49%.  Milrinone restarted at 0.25, co-ox back up to 68% and discharged home on milrinone.    -Co-ox stable at 64% > 46%. Rechecking co-ox. Continue milrinone 0.25, he is milrinone dependent due to low output HF.  Long-term milrinone is not a great option for him given recurrent VT/VF with ICD shocks. - NYHA IV. Good diuresis yesterday after restarting IV lasix. Negative 3L X 24 hrs, weight only down 1 lb. CVP 9-10. Transition IV lasix to po Torsemide 100 mg BID (prior home dose).  -Continue spiro 25 mg  daily -Continue dig 0.125 mg, dig level 0.6 -Continue Farxiga 10 mg daily -No BB d/t low output HF -BP soft. Will hold Losartan. Consider restarting at outpatient f/u. -May need to consider midodrine -CR -He has low output HF.  RV function on RHC appeared adequate with PAPi 4.1, creatinine stable.  We have been discussing LVAD and he has had full workup, he has met an LVAD patient and was able to get his questions about LVAD answered.  He wanted to go home with milrinone to give him a break from the hospital.  We have had discussions about the limitations of milrinone and about how LVAD is really the only good option at this point to reduce mortality, this was reiterated this admission.   He still is not sure what he wants to do.  Fortunately,  he has had no further VT.  He asks about any other medical options.  Unfortunately, they are few. We can try him on omecamtiv when it is available.  Barostimulation activation therapy has not been studied in patients with end stage CHF on inotropes. 3. Leukocytosis/low-grade fever: -Resolved -Chest x-ray and UA okay -Blood cultures pending 4. Atrial fibrillation:  -Paroxysmal.  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 Off Tikosyn after VF on 4/22.  Did not tolerate amiodarone in the past but doing well on it so far. - Continue Eliquis. 5. Aortic stenosis:  -Based on full evaluation (cath and echo), suspect no more than moderate aortic stenosis.  Per Dr Aundra Dubin - he would not benefit from TAVR at this point. 6. CAD:  -Cath 2/22 with nonobstructive disease. -Continue statin -No chest pain.  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  Milrinone decreased to 0.25.  Tikosyn stopped and amiodarone started. No VT since 5/22.  Risk of recurrent VT/VF will be high as long as he is on milrinone. - Continue amio 200 mg daily. 8. Hyponatremia:  -History of hypervolemic hyponatremia. -Sodium 126>129>126>128>128 -Keep fluid restricted. 9. Hx elevated LFTs:    -LFTs have normalized    -HH F2F completed for continuation of home milrinone 0.25 mcg. [facility] / [facility] has been following and aware of admit.   Can likely go home today if repeat co-ox is stable. Discussed with Dr. Aundra Dubin.  CARDIAC MEDS AT DISCHARGE: Amiodarone 200 mg daily Apixaban 5 mg BID Atorvastatin 20  Dapagliflozin 10 mg daily Digoxin 0.125 mg daily Torsemide 100 mg BID Spiro 25 mg daily Potassium 40 mEQ am and 60 mEq pm + extra 40 mEQ with metolazone Takes metolazone 5 mg once every Friday at home (can resume once weekly metolazone next week) Home milrinone 0.25 mcg (orders placed) Stop losartan at D/C - hopefully can restart at f/u   F/u scheduled in HF clinic  Length of Stay: 3  FINCH, LINDSAY N, PA-C  01/14/2021, 7:42 AM  Advanced Heart Failure Team Pager [phone] (M-F; 7a - 5p)  Please contact [facility] for night-coverage after hours (5p -7a ) and weekends on [email or web address]  Patient seen with PA, agree with the above note.    Repeat co-ox 54%, this is acceptable for now.  He diuresed well yesterday, net negative 2902.  CVP down to about 10 on my read.  No dyspnea.   General: NAD Neck: JVP 8 cm, no thyromegaly or thyroid nodule.  Lungs: Clear to auscultation bilaterally with normal respiratory effort. CV: Nondisplaced PMI.  Heart regular S1/S2, no S3/S4, no murmur.  No peripheral edema.   Abdomen: Soft, nontender, no hepatosplenomegaly, no distention.  Skin: Intact without lesions or rashes.  Neurologic: Alert and oriented x 3.  Psych: Normal affect. Extremities: No clubbing or cyanosis.  HEENT: Normal.    Ok for discharge today.  Restart torsemide 100 mg bid and prior home KCl.  We will leave him off losartan for now with soft BP.   Chronic milrinone is not ideal, I discussed LVAD again with him today.  He is still not ready to commit to it.   Loralie Champagne 01/14/2021 11:22 AM

## 2021-01-14 NOTE — Discharge Summary (Signed)
Physician Discharge Summary  Chad Wilkerson Chad Wilkerson:[id number] DOB: 1945/11/21 DOA: 01/10/2021  PCP: [name], NP  Admit date: 01/10/2021 Discharge date: 01/14/2021  Discharge disposition: Home   Recommendations for Outpatient Follow-Up:   Follow-up with PCP in 1 week. Follow-up with cardiologist as scheduled.   Discharge Diagnosis:   Principal Problem:   AKI (acute kidney injury) (HCC) Active Problems:   Sleep apnea   Essential hypertension   Diabetes mellitus without complication (HCC)   Paroxysmal atrial fibrillation (HCC)   Morbid obesity (HCC)   ICD (implantable cardioverter-defibrillator) in place   Hypokalemia   Chronic combined systolic and diastolic CHF (congestive heart failure) (HCC)   Hyponatremia    Discharge Condition: Stable.  Diet recommendation:  Diet Order             Diet Heart Room service appropriate? Yes; Fluid consistency: Thin; Fluid restriction: 1800 mL Fluid  Diet effective now           Diet - low sodium heart healthy                     Code Status: Full Code     Hospital Course:   [name] is a 75 y.o. male with past medical history significant for chronic combined systolic and diastolic CHF (EF less than 20%, grade 3 diastolic dysfunction), paroxysmal atrial fibrillation, aortic stenosis, hypertension, obstructive sleep apnea, morbid obesity, who was referred to the emergency room at the behest of his PCP because of abnormal labs (low sodium and low potassium).  He had been mistakenly taking torsemide 500 mg twice daily instead of taking torsemide 100 twice daily.  He was admitted to the hospital for acute on chronic hyponatremia, AKI and hypokalemia.  He was treated with IV fluids.  Potassium was successfully repleted.  Cardiologist was consulted to assist with management.  He complains of shortness of breath on the cause of hospitalization.  He was treated with IV Lasix for acute exacerbation of chronic systolic  and diastolic CHF.  His condition has improved.  He is deemed stable for discharge to home.    Medical Consultants:   Cardiologist   Discharge Exam:    Vitals:   01/14/21 0800 01/14/21 0854 01/14/21 0900 01/14/21 1116  BP: 96/65  100/68   Pulse: (!) 58 74 61   Resp: 20 16 17    Temp:    98.1 F (36.7 C)  TempSrc:    Oral  SpO2: 95%  93%   Weight:         GEN: NAD SKIN: Warm and dry EYES: No pallor or icterus ENT: MMM CV: RRR PULM: CTA B ABD: soft, ND, NT, +BS CNS: AAO x 3, non focal EXT: No edema or tenderness     The results of significant diagnostics from this hospitalization (including imaging, microbiology, ancillary and laboratory) are listed below for reference.     Procedures and Diagnostic Studies:   No results found.   Labs:   Basic Metabolic Panel: Recent Labs  Lab 01/10/21 1951 01/11/21 0449 01/11/21 1559 01/12/21 0422 01/13/21 0425 01/13/21 1617 01/14/21 0421  NA 123*   < > 126* 129* 126* 128* 128*  K 2.5*   < > 2.5* 3.3* 4.7 3.6 3.6  CL 73*   < > 83* 85* 88* 92* 90*  CO2 34*   < > 33* 31 27 28 30   GLUCOSE 174*   < > 171* 175* 172* 177* 204*  BUN 71*   < >  43* 30* 15 15 14   CREATININE 1.30*   < > 1.12 0.89 0.85 0.78 0.81  CALCIUM 9.7   < > 8.9 9.2 9.1 8.4* 8.2*  MG 2.8*  --   --  2.6* 2.3  --  2.1   < > = values in this interval not displayed.   GFR Estimated Creatinine Clearance: 90.2 mL/min (by C-G formula based on SCr of 0.81 mg/dL). Liver Function Tests: Recent Labs  Lab 01/10/21 1951  AST 24  ALT 22  ALKPHOS 45  BILITOT 0.8  PROT 7.5  ALBUMIN 4.2   No results for input(s): LIPASE, AMYLASE in the last 168 hours. No results for input(s): AMMONIA in the last 168 hours. Coagulation profile No results for input(s): INR, PROTIME in the last 168 hours.  CBC: Recent Labs  Lab 01/10/21 1951 01/12/21 0625 01/13/21 0425 01/14/21 0421  WBC 11.5* 11.3* 12.4* 10.0  NEUTROABS 9.1* 8.8* 9.9* 7.7  HGB 12.8* 11.9* 10.7*  10.3*  HCT 35.6* 33.2* 31.3* 29.1*  MCV 84.4 88.1 89.7 88.7  PLT 238 188 175 162   Cardiac Enzymes: No results for input(s): CKTOTAL, CKMB, CKMBINDEX, TROPONINI in the last 168 hours. BNP: Invalid input(s): POCBNP CBG: Recent Labs  Lab 01/13/21 1131 01/13/21 1528 01/13/21 2028 01/14/21 0552 01/14/21 1119  GLUCAP 194* 196* 181* 153* 242*   D-Dimer No results for input(s): DDIMER in the last 72 hours. Hgb A1c No results for input(s): HGBA1C in the last 72 hours. Lipid Profile No results for input(s): CHOL, HDL, LDLCALC, TRIG, CHOLHDL, LDLDIRECT in the last 72 hours. Thyroid function studies No results for input(s): TSH, T4TOTAL, T3FREE, THYROIDAB in the last 72 hours.  Invalid input(s): FREET3 Anemia work up No results for input(s): VITAMINB12, FOLATE, FERRITIN, TIBC, IRON, RETICCTPCT in the last 72 hours. Microbiology Recent Results (from the past 240 hour(s))  Resp Panel by RT-PCR (Flu A&B, Covid) Nasopharyngeal Swab     Status: None   Collection Time: 01/10/21  8:48 PM   Specimen: Nasopharyngeal Swab; Nasopharyngeal(NP) swabs in vial transport medium  Result Value Ref Range Status   SARS Coronavirus 2 by RT PCR NEGATIVE NEGATIVE Final    Comment: (NOTE) SARS-CoV-2 target nucleic acids are NOT DETECTED.  The SARS-CoV-2 RNA is generally detectable in upper respiratory specimens during the acute phase of infection. The lowest concentration of SARS-CoV-2 viral copies this assay can detect is 138 copies/mL. A negative result does not preclude SARS-Cov-2 infection and should not be used as the sole basis for treatment or other patient management decisions. A negative result may occur with  improper specimen collection/handling, submission of specimen other than nasopharyngeal swab, presence of viral mutation(s) within the areas targeted by this assay, and inadequate number of viral copies(<138 copies/mL). A negative result must be combined with clinical observations,  patient history, and epidemiological information. The expected result is Negative.  Fact Sheet for Patients:  BloggerCourse.comhttps://www.fda.gov/media/152166/download  Fact Sheet for Healthcare Providers:  SeriousBroker.ithttps://www.fda.gov/media/152162/download  This test is no t yet approved or cleared by the [place] FDA and  has been authorized for detection and/or diagnosis of SARS-CoV-2 by FDA under an Emergency Use Authorization (EUA). This EUA will remain  in effect (meaning this test can be used) for the duration of the COVID-19 declaration under Section 564(b)(1) of the Act, 21 U.S.C.section 360bbb-3(b)(1), unless the authorization is terminated  or revoked sooner.       Influenza A by PCR NEGATIVE NEGATIVE Final   Influenza B by PCR NEGATIVE NEGATIVE Final  Comment: (NOTE) The Xpert Xpress SARS-CoV-2/FLU/RSV plus assay is intended as an aid in the diagnosis of influenza from Nasopharyngeal swab specimens and should not be used as a sole basis for treatment. Nasal washings and aspirates are unacceptable for Xpert Xpress SARS-CoV-2/FLU/RSV testing.  Fact Sheet for Patients: BloggerCourse.com  Fact Sheet for Healthcare Providers: SeriousBroker.it  This test is not yet approved or cleared by the [place] FDA and has been authorized for detection and/or diagnosis of SARS-CoV-2 by FDA under an Emergency Use Authorization (EUA). This EUA will remain in effect (meaning this test can be used) for the duration of the COVID-19 declaration under Section 564(b)(1) of the Act, 21 U.S.C. section 360bbb-3(b)(1), unless the authorization is terminated or revoked.  Performed at [facility], [address] [place], [place] [address]   Urine Culture     Status: None   Collection Time: 01/13/21  9:14 AM   Specimen: Urine, Clean Catch  Result Value Ref Range Status   Specimen Description URINE, CLEAN CATCH  Final   Special  Requests NONE  Final   Culture   Final    NO GROWTH Performed at [facility], [address] [place], [place] [address]    Report Status 01/14/2021 FINAL  Final  Culture, blood (routine x 2)     Status: None (Preliminary result)   Collection Time: 01/13/21  9:31 AM   Specimen: BLOOD LEFT HAND  Result Value Ref Range Status   Specimen Description BLOOD LEFT HAND  Final   Special Requests   Final    BOTTLES DRAWN AEROBIC AND ANAEROBIC Blood Culture adequate volume   Culture   Final    NO GROWTH < 24 HOURS Performed at [facility], [address], [place], [place] [address]    Report Status PENDING  Incomplete  Culture, blood (routine x 2)     Status: None (Preliminary result)   Collection Time: 01/13/21  9:34 AM   Specimen: BLOOD  Result Value Ref Range Status   Specimen Description BLOOD LEFT ANTECUBITAL  Final   Special Requests   Final    BOTTLES DRAWN AEROBIC AND ANAEROBIC Blood Culture adequate volume   Culture   Final    NO GROWTH < 24 HOURS Performed at [facility], [address] [place], [place] [address]    Report Status PENDING  Incomplete     Discharge Instructions:   Discharge Instructions     Diet - low sodium heart healthy   Complete by: As directed    Increase activity slowly   Complete by: As directed       Allergies as of 01/14/2021       Reactions   Semaglutide Other (See Comments)   Nausea ,vomiting ,back pain        Medication List     STOP taking these medications    losartan 25 MG tablet Commonly known as: COZAAR       TAKE these medications    acetaminophen 500 MG tablet Commonly known as: TYLENOL Take 1,000 mg by mouth every 6 (six) hours as needed for headache or mild pain.   amiodarone 200 MG tablet Commonly known as: PACERONE Take 200 mg by mouth daily.   atorvastatin 20 MG tablet Commonly known as: LIPITOR Take 1 tablet (20 mg total) by mouth daily.   B COMPLEX PO Take 1 tablet by mouth  daily.   Biotin 10 MG Tabs Take 10 mg by mouth daily.  10 mg - 10,000 mcg   budesonide-formoterol 160-4.5 MCG/ACT inhaler Commonly known as: SYMBICORT Inhale 2 puffs into the lungs 2 (two) times daily.   CALCIUM PO Take 1 tablet by mouth 2 (two) times daily.   cholecalciferol 25 MCG (1000 UNIT) tablet Commonly known as: VITAMIN D3 Take 1,000 Units by mouth 2 (two) times daily.   CHROMIUM PO Take 1 tablet by mouth 2 (two) times daily.   dapagliflozin propanediol 10 MG Tabs tablet Commonly known as: Farxiga Take 1 tablet (10 mg total) by mouth daily before breakfast.   digoxin 0.125 MG tablet Commonly known as: LANOXIN Take 1 tablet (0.125 mg total) by mouth daily.   Eliquis 5 MG Tabs tablet Generic drug: apixaban Take 1 tablet (5 mg total) by mouth 2 (two) times daily.   escitalopram 10 MG tablet Commonly known as: LEXAPRO TAKE 1 TABLET(10 MG) BY MOUTH DAILY What changed:  how much to take how to take this when to take this additional instructions   L-Arginine-500 500 MG Caps Generic drug: Arginine Take 500 mg by mouth daily.   L-Carnitine 500 MG Tabs Take 500 mg by mouth daily.   Magnesium 400 MG Tabs Take 400 mg by mouth daily.   metFORMIN 500 MG tablet Commonly known as: GLUCOPHAGE Take 1-3 tablets (500-1,500 mg total) by mouth See admin instructions. Take one tablet (500 mg) by mouth daily with breakfast and three tablets (1500 mg) daily with supper   metolazone 2.5 MG tablet Commonly known as: ZAROXOLYN Take 1 tablet (2.5 mg total) by mouth every Friday.   milrinone 20 MG/100 ML Soln infusion Commonly known as: PRIMACOR Inject 0.0243 mg/min into the vein continuous.   OneTouch Ultra test strip Generic drug: glucose blood Use as instructed to check blood sugars twice daily   potassium chloride SA 20 MEQ tablet Commonly known as: KLOR-CON Take 2-3 tablets (40-60 mEq total) by mouth 2 (two) times daily. 40mg  in the morning and 60mg  at night. Take 2  extra tablets on metolazone days   SAW PALMETTO COMPLEX PO Take 540 mg by mouth daily.   spironolactone 25 MG tablet Commonly known as: ALDACTONE Take 1 tablet (25 mg total) by mouth daily.   torsemide 100 MG tablet Commonly known as: DEMADEX Take 1 tablet (100 mg total) by mouth 2 (two) times daily.   vitamin C 1000 MG tablet Take 1,000 mg by mouth 2 (two) times daily.   vitamin E 180 MG (400 UNITS) capsule Take 400 Units by mouth daily.   Zinc 50 MG Tabs Take 50 mg by mouth daily.               Durable Medical Equipment  (From admission, onward)           Start     Ordered   01/12/21 1522  Heart failure home health orders  (Heart failure home health orders / Face to face)  Once       Comments: Heart Failure Follow-up Care:  Verify follow-up appointments per Patient Discharge Instructions. Confirm transportation arranged. Reconcile home medications with discharge medication list. Remove discontinued medications from use. Assist patient/caregiver to manage medications using pill box. Reinforce low sodium food selection Assessments: Vital signs and oxygen saturation at each visit. Assess home environment for safety concerns, caregiver support and availability of low-sodium foods. Consult Child psychotherapist, PT/OT, Dietitian, and CNA based on assessments. Perform comprehensive cardiopulmonary assessment. Notify MD for any change in condition or weight gain of 3 pounds in one  day or 5 pounds in one week with symptoms. Daily Weights and Symptom Monitoring: Ensure patient has access to scales. Teach patient/caregiver to weigh daily before breakfast and after voiding using same scale and record.    Teach patient/caregiver to track weight and symptoms and when to notify Provider. Activity: Develop individualized activity plan with patient/caregiver.  Resume home milrinone 0.25 mcg /kg/min  [facility] to provide  Labs every other week to include BMET, Mg, and CBC with Diff.  Additional as needed. Should be drawn via PERIPHERAL stick. NOT PICC line.   Q3335 Milrinone 0. 25  mcg/kg/min X 52 weeks A4221 Supplies for maintenance of drug infusion catheter A4222 Supplies for the external drug infusion per cassette or bag E0781 Ambulatory Infusion pump  Question Answer Comment  Heart Failure Follow-up Care [facility] at [phone]   Obtain the following labs Basic Metabolic Panel   Lab frequency Other see comments   Fax lab results to [facility] at [phone]   Diet Low Sodium Heart Healthy   Fluid restrictions: 2000 mL Fluid      01/12/21 1522            Follow-up Information     [facility] Follow up.   Why: contact number [phone] agency will resume at discharge        [place], [facility] Follow up.   Why: Home Health RN Clent Ridges will resume Contact information: [address] [place] [address] [phone]         [facility] Follow up on 01/26/2021.   Specialty: Cardiology Why: [facility] at [facility] 12 pm Entrance C Contact information: [address] [phone] Wilhemina Bonito [place] [address] [phone]                 Time coordinating discharge: 33 minutes  Signed:  Lurene Shadow  [facility] 01/14/2021, 11:58 AM   Pager on [email or web address]. If 7PM-7AM, please contact night-coverage at [email or web address]

## 2021-01-17 ENCOUNTER — Telehealth: Payer: Self-pay

## 2021-01-17 NOTE — Telephone Encounter (Signed)
Transition Care Management Unsuccessful Follow-up Telephone Call  Date of discharge and from where:  01/14/2021  [facility]   Attempts:  2nd Attempt  Reason for unsuccessful TCM follow-up call:  No answer/busy

## 2021-01-18 ENCOUNTER — Telehealth: Payer: Self-pay | Admitting: Pharmacist

## 2021-01-18 ENCOUNTER — Encounter (HOSPITAL_COMMUNITY): Payer: Medicare Other

## 2021-01-18 ENCOUNTER — Telehealth (HOSPITAL_COMMUNITY): Payer: Self-pay | Admitting: *Deleted

## 2021-01-18 LAB — CULTURE, BLOOD (ROUTINE X 2)
Culture: NO GROWTH
Culture: NO GROWTH
Special Requests: ADEQUATE
Special Requests: ADEQUATE

## 2021-01-18 NOTE — Telephone Encounter (Signed)
Donita w/[facility] called to report that pt has a double lumen picc and they cant flush the second line. Insurance will not cover cathflo in the home. Per Dinah Beers schedule TPA with short stay. Per [facility] short stay they can't do it until 8/16. They asked that I call [facility]. [facility] said they don't do outpt TPA. Pt scheduled at [facility] 8/16 at 11:00am. Donita aware and pt scheduled. Per Dinah Beers ok to wait until 8/16 as long as his line is still infusing.

## 2021-01-18 NOTE — Chronic Care Management (AMB) (Signed)
Chronic Care Management Pharmacy Assistant   Name: Chad Wilkerson  MRN: [id number] DOB: 10-04-1945   Reason for Encounter: Disease State/ General Assessment Call.    Conditions to be addressed/monitored: HTN and DMII   Recent office visits:  None.   Recent consult visits:  12/16/20 [name] MD (Cardiology) - seen for heart failure. Patient started on metolazone 2.5mg  and changed potassium - 40 MEQ total twice daily to take 2 extra tablets on metolazone days. Follow up in 6 weeks.   12/15/20 [name] MD (Cardiology) - seen for heart failure and other chronic conditions. No medication changes. Follow up in 3-4 months.   Hospital visits:  Medication Reconciliation was completed by comparing discharge summary, patient's EMR and Pharmacy list, and upon discussion with patient.  Admitted to the hospital on 01/10/21 due to acute kidney injury. Discharge date was 01/14/21. Discharged from [facility].    New?Medications Started at [facility] Discharge:?? -started None.  Medication Changes at Hospital Discharge: -Changed Escitalopram  Medications Discontinued at Hospital Discharge: -Stopped losartan 25mg    Medications that remain the same after Hospital Discharge:??  -All other medications will remain the same.    Medications: Outpatient Encounter Medications as of 01/18/2021  Medication Sig   acetaminophen (TYLENOL) 500 MG tablet Take 1,000 mg by mouth every 6 (six) hours as needed for headache or mild pain.   amiodarone (PACERONE) 200 MG tablet Take 200 mg by mouth daily.   Arginine (L-ARGININE-500) 500 MG CAPS Take 500 mg by mouth daily.   Ascorbic Acid (VITAMIN C) 1000 MG tablet Take 1,000 mg by mouth 2 (two) times daily.   atorvastatin (LIPITOR) 20 MG tablet Take 1 tablet (20 mg total) by mouth daily.   B Complex Vitamins (B COMPLEX PO) Take 1 tablet by mouth daily.   Biotin 10 MG TABS Take 10 mg by mouth daily. 10 mg - 10,000 mcg    budesonide-formoterol (SYMBICORT) 160-4.5 MCG/ACT inhaler Inhale 2 puffs into the lungs 2 (two) times daily.   CALCIUM PO Take 1 tablet by mouth 2 (two) times daily.   cholecalciferol (VITAMIN D3) 25 MCG (1000 UNIT) tablet Take 1,000 Units by mouth 2 (two) times daily.   CHROMIUM PO Take 1 tablet by mouth 2 (two) times daily.   dapagliflozin propanediol (FARXIGA) 10 MG TABS tablet Take 1 tablet (10 mg total) by mouth daily before breakfast.   digoxin (LANOXIN) 0.125 MG tablet Take 1 tablet (0.125 mg total) by mouth daily.   ELIQUIS 5 MG TABS tablet Take 1 tablet (5 mg total) by mouth 2 (two) times daily.   escitalopram (LEXAPRO) 10 MG tablet TAKE 1 TABLET(10 MG) BY MOUTH DAILY   levOCARNitine (L-CARNITINE) 500 MG TABS Take 500 mg by mouth daily.   Magnesium 400 MG TABS Take 400 mg by mouth daily.   metFORMIN (GLUCOPHAGE) 500 MG tablet Take 1-3 tablets (500-1,500 mg total) by mouth See admin instructions. Take one tablet (500 mg) by mouth daily with breakfast and three tablets (1500 mg) daily with supper   metolazone (ZAROXOLYN) 2.5 MG tablet Take 1 tablet (2.5 mg total) by mouth every Friday.   milrinone (PRIMACOR) 20 MG/100 ML SOLN infusion Inject 0.0243 mg/min into the vein continuous.   ONETOUCH ULTRA test strip Use as instructed to check blood sugars twice daily   potassium chloride SA (KLOR-CON) 20 MEQ tablet Take 2-3 tablets (40-60 mEq total) by mouth 2 (two) times daily. 40mg  in the morning and 60mg  at night.  Take 2 extra tablets on metolazone days   spironolactone (ALDACTONE) 25 MG tablet Take 1 tablet (25 mg total) by mouth daily.   torsemide (DEMADEX) 100 MG tablet Take 1 tablet (100 mg total) by mouth 2 (two) times daily.   vitamin E 180 MG (400 UNITS) capsule Take 400 Units by mouth daily.   Zinc 50 MG TABS Take 50 mg by mouth daily.   Zn-Pyg Afri-Nettle-Saw Palmet (SAW PALMETTO COMPLEX PO) Take 540 mg by mouth daily.   No facility-administered encounter medications on file as of  01/18/2021.   Fill History: AMIODARONE 200MG  TABLETS 12/20/2020 45   ELIQUIS  5 MG TABS 01/16/2021 30   ATORVASTATIN 20MG  TABLETS 11/11/2020 90   FARXIGA 10MG  TABLETS 08/10/2020 30   DIGOXIN 0.125MG  TABLETS 12/26/2020 30   DOFETILIDE CAPSULES 10/25/2020 83   ESCITALOPRAM 10MG  TABLETS 12/15/2020 90   ONE TOUCH ULTRA BLUE TESTST(NEW)100 01/02/2021 90   LOSARTAN 25MG  TABLETS 12/05/2020 30   POTASSIUM CHLORIDE ER TABLETS 10/12/2020 30   POTASSIUM CL 01/04/2021 ER TABLETS 11/10/2020 90   SPIRONOLACTONE 25MG  TABLETS 10/25/2020 90   TORSEMIDE 100MG  (HUNDREDMG) TABLET 12/22/2020 90   METFORMIN 500MG  TABLETS 11/02/2020 90   METOLAZONE 2.5MG  TABLETS 12/16/2020 90   Recent Relevant Labs: Lab Results  Component Value Date/Time   HGBA1C 6.7 (H) 01/11/2021 04:40 AM   HGBA1C 6.6 (H) 09/29/2020 12:40 AM    Kidney Function Lab Results  Component Value Date/Time   CREATININE 0.81 01/14/2021 04:21 AM   CREATININE 0.78 01/13/2021 04:17 PM   CREATININE 1.08 03/30/2020 02:43 PM   GFR 60.62 10/15/2020 02:59 PM   GFRNONAA >60 01/14/2021 04:21 AM   GFRNONAA 67 03/30/2020 02:43 PM   GFRAA 76 08/04/2020 01:54 PM   GFRAA 78 03/30/2020 02:43 PM    Current antihyperglycemic regimen:  Farxiga 10mg  - take 1 tablet daily before breakfast Metformin 500mg  - take 1 tablet at breakfast and 3 tablets with supper. What recent interventions/DTPs have been made to improve glycemic control:  None.  Have there been any recent hospitalizations or ED visits since last visit with CPP? Yes Patient denies hypoglycemic symptoms, including None Patient reports hyperglycemic symptoms, including blurry vision and dizziness.  How often are you checking your blood sugar? twice daily What are your blood sugars ranging? Takes when he wakes up in the am and 2 hours after eating at night. Numbers have been up and down lately. Was 177 this morning 01/18/21 fasting. Patient does not remember what it was running  at night before he went into hospital. Ranging from 167- 400 while in hospital.  During the week, how often does your blood glucose drop below 70? Never  Are you checking your feet daily/regularly? Yes he checks them daily.   Adherence Review: Is the patient currently on a STATIN medication? Yes Is the patient currently on ACE/ARB medication? No Does the patient have >5 day gap between last estimated fill dates? No   Reviewed chart prior to disease state call. Spoke with patient regarding BP  Recent Office Vitals: BP Readings from Last 3 Encounters:  01/14/21 100/68  12/16/20 104/60  12/15/20 104/60   Pulse Readings from Last 3 Encounters:  01/14/21 61  12/16/20 66  12/15/20 72    Wt Readings from Last 3 Encounters:  01/14/21 219 lb 12.8 oz (99.7 kg)  12/16/20 221 lb 3.2 oz (100.3 kg)  12/15/20 218 lb (98.9 kg)     Kidney Function Lab Results  Component Value Date/Time  CREATININE 0.81 01/14/2021 04:21 AM   CREATININE 0.78 01/13/2021 04:17 PM   CREATININE 1.08 03/30/2020 02:43 PM   GFR 60.62 10/15/2020 02:59 PM   GFRNONAA >60 01/14/2021 04:21 AM   GFRNONAA 67 03/30/2020 02:43 PM   GFRAA 76 08/04/2020 01:54 PM   GFRAA 78 03/30/2020 02:43 PM    BMP Latest Ref Rng & Units 01/14/2021 01/13/2021 01/13/2021  Glucose 70 - 99 mg/dL 643(P) 295(J) 884(Z)  BUN 8 - 23 mg/dL 14 15 15   Creatinine 0.61 - 1.24 mg/dL 6.60 6.30  BUN/Creat Ratio 10 - 24 - - -  Sodium 135 - 145 mmol/L 128(L) 128(L) 126(L)  Potassium 3.5 - 5.1 mmol/L 3.6 3.6 4.7  Chloride 98 - 111 mmol/L 90(L) 92(L) 88(L)  CO2 22 - 32 mmol/L 30 28 27   Calcium 8.9 - 10.3 mg/dL 8.2(L) 8.4(L) 9.1    Current antihypertensive regimen:  Spironolactone 25mg  - take 1 tablet daily.  How often are you checking your Blood Pressure? daily Current home BP readings: patients friend and live in roommate 1.60 states that patient was asleep upstairs and she could not get to his log but she will call and leave some reading on my  voicemail when she can.  What recent interventions/DTPs have been made by any provider to improve Blood Pressure control since last CPP Visit: no longer on losartan or carvedilol.  Any recent hospitalizations or ED visits since last visit with CPP? Yes  Adherence Review: Is the patient currently on ACE/ARB medication? No Does the patient have >5 day gap between last estimated fill dates? No  Notes: Spoke with who stated that patient will be seeing his PCP for a hospital follow up regarding his medications this Friday. Patient is still taking regular prescribed medications. [name] sates that patient is a [name] and has always been able to control his blood sugar through his diet and has never had higher blood sugar until being in hospital recently and was given insulin 4 times daily and was never told anything except that it was protocol for them to give him insulin. Patient states that 5 out of his 13 prescribed medications can cause elevated blood sugar and believes there is some interactions taking place that is causing his blood sugar to suddenly and recently go up. Wednesday wants [name] to look into this further and consider what can be changed or taken away to help this issue besides just giving insulin or prescribing more medications. Investment banker, operational states that patient makes nearly all of the meals at home and she grows all the herbs used. Patient has a low sodium diet and does not eat a lot of carbs or have real sugar and he has sugars like stevia in the place of regular sugar. Patient has yogurt and fruit or a bagel with cream cheese for breakfast usually or some eggs and bacon here and there. For lunch patient has a salad or a wrap with various meats and cheese and lettuce. Patient cooks things like chicken or pork for dinner with various vegetables and has a cup of kale every night. Patient drinks 2 liters of water a day. Patient does not eat junk food or sweets and does not drink soda.  Patient is restricted due to having a pic line on what he can do activity wise. Patient is able to walk and do things around the house and he gets out with rebecca when she runs errands.   Care Gaps:  AWV - scheduled for 06/01/21  Foot exam -  never done Ophthalmology exam - never done Urine microalbumin - never done Zoster vaccines - never done Colonoscopy - never done Covid-19 vaccine booster 4 - overdue since 06/26/20 Influenza vaccine - due  Star Rating Drugs:  Atorvastatin 20mg  - last filled on 11/11/20 90DS at [facility]  Dapagliflozin propanediol 10mg  - last filled 08/10/20 30DS at [facility] Metformin 500mg  - last filled on 11/02/20 90DS at [facility] CMA  Clinical Pharmacist Assistant [phone]

## 2021-01-20 ENCOUNTER — Other Ambulatory Visit: Payer: Self-pay

## 2021-01-20 ENCOUNTER — Encounter: Payer: Self-pay | Admitting: Adult Health

## 2021-01-21 ENCOUNTER — Ambulatory Visit (INDEPENDENT_AMBULATORY_CARE_PROVIDER_SITE_OTHER): Payer: Medicare Other | Admitting: Adult Health

## 2021-01-21 ENCOUNTER — Encounter: Payer: Self-pay | Admitting: Adult Health

## 2021-01-21 VITALS — BP 102/62 | HR 67 | Temp 97.9°F | Ht 68.0 in | Wt 217.0 lb

## 2021-01-21 DIAGNOSIS — E876 Hypokalemia: Secondary | ICD-10-CM | POA: Diagnosis not present

## 2021-01-21 DIAGNOSIS — E1169 Type 2 diabetes mellitus with other specified complication: Secondary | ICD-10-CM | POA: Diagnosis not present

## 2021-01-21 DIAGNOSIS — N179 Acute kidney failure, unspecified: Secondary | ICD-10-CM

## 2021-01-21 DIAGNOSIS — I5042 Chronic combined systolic (congestive) and diastolic (congestive) heart failure: Secondary | ICD-10-CM | POA: Diagnosis not present

## 2021-01-21 DIAGNOSIS — E871 Hypo-osmolality and hyponatremia: Secondary | ICD-10-CM | POA: Diagnosis not present

## 2021-01-21 LAB — URINALYSIS
Bilirubin Urine: NEGATIVE
Hgb urine dipstick: NEGATIVE
Ketones, ur: NEGATIVE
Leukocytes,Ua: NEGATIVE
Nitrite: NEGATIVE
Specific Gravity, Urine: 1.01 (ref 1.000–1.030)
Total Protein, Urine: NEGATIVE
Urine Glucose: 500 — AB
Urobilinogen, UA: 0.2 (ref 0.0–1.0)
pH: 7 (ref 5.0–8.0)

## 2021-01-21 LAB — BASIC METABOLIC PANEL WITH GFR
BUN: 40 mg/dL — ABNORMAL HIGH (ref 6–23)
CO2: 32 meq/L (ref 19–32)
Calcium: 9.7 mg/dL (ref 8.4–10.5)
Chloride: 85 meq/L — ABNORMAL LOW (ref 96–112)
Creatinine, Ser: 1.11 mg/dL (ref 0.40–1.50)
GFR: 65.11 mL/min
Glucose, Bld: 206 mg/dL — ABNORMAL HIGH (ref 70–99)
Potassium: 3.6 meq/L (ref 3.5–5.1)
Sodium: 131 meq/L — ABNORMAL LOW (ref 135–145)

## 2021-01-21 MED ORDER — ONETOUCH ULTRA 2 W/DEVICE KIT: PACK | 0 refills | Status: AC

## 2021-01-21 NOTE — Progress Notes (Signed)
Subjective:    Patient ID: Chad Wilkerson, male    DOB: [birth year], 74 y.o.   MRN: [id number]  HPI 75 year old male who  has a past medical history of Abnormal liver function tests, Adjustment disorder, Aortic stenosis, Benign colon polyp, Chronic systolic CHF (congestive heart failure) ([place]), Diabetes mellitus without complication ([place]), Diverticulitis, Essential hypertension, Heart disease, Hypogonadism male, Hyponatremia, ICD (implantable cardioverter-defibrillator) in place, Mitral regurgitation, NICM (nonischemic cardiomyopathy) ([place]), PAF (paroxysmal atrial fibrillation) ([place]), Sleep apnea, Tricuspid regurgitation, Urine incontinence, Ventricular tachycardia ([place]), and Vitamin D deficiency.  He presents to the office today for TCM visit  Admit Date 01/10/2021 Discharge Date 01/14/2021  He was referred to the emergency room on 01/10/2021 after his stat being met that day revealed a sodium of 124, K of 2.3, creatinine of 1.29 ( baseline 0.9) after had mistakenly taking torsemide 500 mg twice daily instead of taking torsemide 100 mg daily.  He was admitted to the hospital for acute on chronic hyponatremia, AKI, and hypokalemia.  He was treated with IV fluids.  Potassium was successfully repleted.  His cardiologist was consulted to assist with management.  He complained of shortness of breath on the cause of hospitalization.  He was treated with IV Lasix for acute exacerbation of chronic systolic and diastolic CHF.  His condition improved and he was deemed stable for discharge.  Today he reports that he is feeling better, his blood sugars are improving, he is wondering why his blood sugars were erratic when he was in the hospital.  I explained to him that this was likely due to dehydration and his blood being more concentrated with glucose due to less volume.  Would like a new glucometer, his is about 75 years old and also referral to an ophthalmologist so he can have his eye exam done.   Review  of Systems See HPI   Past Medical History:  Diagnosis Date   Abnormal liver function tests    Adjustment disorder    Aortic stenosis    Benign colon polyp    Chronic systolic CHF (congestive heart failure) (H[facility])    Diabetes mellitus without complication ([place])    Diverticulitis    Essential hypertension    Heart disease    Hypogonadism male    Hyponatremia    ICD (implantable cardioverter-defibrillator) in place    Mitral regurgitation    NICM (nonischemic cardiomyopathy) (H[facility])    PAF (paroxysmal atrial fibrillation) (H[facility])    Sleep apnea    Tricuspid regurgitation    Urine incontinence    Ventricular tachycardia (HCC)    Vitamin D deficiency     Social History   Socioeconomic History   Marital status: Widowed    Spouse name: Not on file   Number of children: Not on file   Years of education: Not on file   Highest education level: Not on file  Occupational History   Not on file  Tobacco Use   Smoking status: Former   Smokeless tobacco: Never  Vaping Use   Vaping Use: Never used  Substance and Sexual Activity   Alcohol use: Not Currently   Drug use: Not Currently   Sexual activity: Not on file  Other Topics Concern   Not on file  Social History Narrative   Not on file   Social Determinants of Health   Financial Resource Strain: Low Risk    Difficulty of Paying Living Expenses: Not very hard  Food Insecurity: No Food Insecurity   Worried  About Running Out of Food in the Last Year: Never true   Ran Out of Food in the Last Year: Never true  Transportation Needs: No Transportation Needs   Lack of Transportation (Medical): No   Lack of Transportation (Non-Medical): No  Physical Activity: Inactive   Days of Exercise per Week: 0 days   Minutes of Exercise per Session: 0 min  Stress: No Stress Concern Present   Feeling of Stress : Not at all  Social Connections: Moderately Isolated   Frequency of Communication with Friends and Family: Once a week   Frequency  of Social Gatherings with Friends and Family: More than three times a week   Attends Religious Services: Never   Marine scientist or Organizations: No   Attends Archivist Meetings: Never   Marital Status: Living with partner  Intimate Partner Violence: Not At Risk   Fear of Current or Ex-Partner: No   Emotionally Abused: No   Physically Abused: No   Sexually Abused: No    Past Surgical History:  Procedure Laterality Date   CHOLECYSTECTOMY     RIGHT HEART CATH N/A 10/05/2020   Procedure: RIGHT HEART CATH;  Surgeon: Larey Dresser, MD;  Location: [facility];  Service: Cardiovascular;  Laterality: N/A;   RIGHT/LEFT HEART CATH AND CORONARY ANGIOGRAPHY N/A 07/19/2020   Procedure: RIGHT/LEFT HEART CATH AND CORONARY ANGIOGRAPHY;  Surgeon: Burnell Blanks, MD;  Location: [facility];  Service: Cardiovascular;  Laterality: N/A;   TONSILLECTOMY      Family History  Problem Relation Age of Onset   Heart attack Mother    High Cholesterol Mother    Asthma Father    Kidney disease Sister    Stroke Maternal Grandfather    Heart attack Paternal Grandfather     Allergies  Allergen Reactions   Semaglutide Other (See Comments)    Nausea ,vomiting ,back pain    Current Outpatient Medications on File Prior to Visit  Medication Sig Dispense Refill   acetaminophen (TYLENOL) 500 MG tablet Take 1,000 mg by mouth every 6 (six) hours as needed for headache or mild pain.     amiodarone (PACERONE) 200 MG tablet Take 200 mg by mouth daily.     Arginine (L-ARGININE-500) 500 MG CAPS Take 500 mg by mouth daily.     Ascorbic Acid (VITAMIN C) 1000 MG tablet Take 1,000 mg by mouth 2 (two) times daily.     atorvastatin (LIPITOR) 20 MG tablet Take 1 tablet (20 mg total) by mouth daily. 30 tablet 3   B Complex Vitamins (B COMPLEX PO) Take 1 tablet by mouth daily.     Biotin 10 MG TABS Take 10 mg by mouth daily. 10 mg - 10,000 mcg     budesonide-formoterol (SYMBICORT)  160-4.5 MCG/ACT inhaler Inhale 2 puffs into the lungs 2 (two) times daily. 30.6 g 3   CALCIUM PO Take 1 tablet by mouth 2 (two) times daily.     cholecalciferol (VITAMIN D3) 25 MCG (1000 UNIT) tablet Take 1,000 Units by mouth 2 (two) times daily.     CHROMIUM PO Take 1 tablet by mouth 2 (two) times daily.     dapagliflozin propanediol (FARXIGA) 10 MG TABS tablet Take 1 tablet (10 mg total) by mouth daily before breakfast. 28 tablet 0   digoxin (LANOXIN) 0.125 MG tablet Take 1 tablet (0.125 mg total) by mouth daily. 30 tablet 3   ELIQUIS 5 MG TABS tablet Take 1 tablet (5 mg  total) by mouth 2 (two) times daily. 180 tablet 3   escitalopram (LEXAPRO) 10 MG tablet TAKE 1 TABLET(10 MG) BY MOUTH DAILY 90 tablet 0   levOCARNitine (L-CARNITINE) 500 MG TABS Take 500 mg by mouth daily.     Magnesium 400 MG TABS Take 400 mg by mouth daily. 60 tablet 3   metFORMIN (GLUCOPHAGE) 500 MG tablet Take 1-3 tablets (500-1,500 mg total) by mouth See admin instructions. Take one tablet (500 mg) by mouth daily with breakfast and three tablets (1500 mg) daily with supper     metolazone (ZAROXOLYN) 2.5 MG tablet Take 1 tablet (2.5 mg total) by mouth every Friday. 12 tablet 3   milrinone (PRIMACOR) 20 MG/100 ML SOLN infusion Inject 0.0243 mg/min into the vein continuous.     ONETOUCH ULTRA test strip Use as instructed to check blood sugars twice daily 600 each 3   potassium chloride SA (KLOR-CON) 20 MEQ tablet Take 2-3 tablets (40-60 mEq total) by mouth 2 (two) times daily. $RemoveBefo'40mg'CFLrNJKHwFS$  in the morning and $RemoveBef'60mg'vTRcNRHRGK$  at night. Take 2 extra tablets on metolazone days     spironolactone (ALDACTONE) 25 MG tablet Take 1 tablet (25 mg total) by mouth daily. 90 tablet 3   torsemide (DEMADEX) 100 MG tablet Take 1 tablet (100 mg total) by mouth 2 (two) times daily. 60 tablet 3   vitamin E 180 MG (400 UNITS) capsule Take 400 Units by mouth daily.     Zinc 50 MG TABS Take 50 mg by mouth daily.     Zn-Pyg Afri-Nettle-Saw Palmet (SAW PALMETTO COMPLEX  PO) Take 540 mg by mouth daily.     No current facility-administered medications on file prior to visit.    BP 102/62   Pulse 67   Temp 97.9 F (36.6 C) (Oral)   Ht $R'5\' 8"'YE$  (1.727 m)   Wt 217 lb (98.4 kg)   SpO2 96%   BMI 32.99 kg/m       Objective:   Physical Exam Vitals and nursing note reviewed.  Constitutional:      Appearance: Normal appearance.  Cardiovascular:     Rate and Rhythm: Normal rate and regular rhythm.     Pulses: Normal pulses.     Heart sounds: Normal heart sounds.  Pulmonary:     Effort: Pulmonary effort is normal.     Breath sounds: Normal breath sounds.  Musculoskeletal:        General: Normal range of motion.  Skin:    General: Skin is warm and dry.     Capillary Refill: Capillary refill takes less than 2 seconds.  Neurological:     General: No focal deficit present.     Mental Status: He is alert and oriented to person, place, and time.  Psychiatric:        Mood and Affect: Mood normal.        Behavior: Behavior normal.        Thought Content: Thought content normal.        Judgment: Judgment normal.          Assessment & Plan:   1. AKI (acute kidney injury) [facility]) -Hospital admission notes, discharge instructions, labs, imaging reviewed with the patient.  All questions answered to the best of my ability.  He does appear to be euvolemic today - Basic Metabolic Panel; Future - Urinalysis; Future - Basic Metabolic Panel - Urinalysis  2. Chronic combined systolic and diastolic CHF (congestive heart failure) ([place]) - Follow up with Cardiology as directed -  Basic Metabolic Panel; Future - Urinalysis; Future - Basic Metabolic Panel - Urinalysis  3. Hyponatremia  - Basic Metabolic Panel; Future - Urinalysis; Future - Basic Metabolic Panel - Urinalysis  4. Hypokalemia  - Basic Metabolic Panel; Future - Urinalysis; Future - Basic Metabolic Panel - Urinalysis  5. Type 2 diabetes mellitus with other specified complication, without  long-term current use of insulin ([place])  - Ambulatory referral to Ophthalmology - Basic Metabolic Panel; Future - Urinalysis; Future - Basic Metabolic Panel - Urinalysis - Blood Glucose Monitoring Suppl (ONE TOUCH ULTRA 2) w/Device KIT; Use to monitor blood sugar  Dispense: 1 kit; Refill: 0   Dorothyann Peng, NP

## 2021-01-22 ENCOUNTER — Encounter: Payer: Self-pay | Admitting: Adult Health

## 2021-01-24 ENCOUNTER — Ambulatory Visit (INDEPENDENT_AMBULATORY_CARE_PROVIDER_SITE_OTHER): Payer: Medicare Other

## 2021-01-24 DIAGNOSIS — I5022 Chronic systolic (congestive) heart failure: Secondary | ICD-10-CM

## 2021-01-24 LAB — CUP PACEART REMOTE DEVICE CHECK
Battery Remaining Longevity: 84 mo
Battery Voltage: 3.01 V
Brady Statistic RV Percent Paced: 2.16 %
Date Time Interrogation Session: 20220813033523
HighPow Impedance: 98 Ohm
Implantable Lead Implant Date: 20180625
Implantable Lead Location: 753860
Implantable Pulse Generator Implant Date: 20180625
Lead Channel Impedance Value: 342 Ohm
Lead Channel Impedance Value: 418 Ohm
Lead Channel Pacing Threshold Amplitude: 0.875 V
Lead Channel Pacing Threshold Pulse Width: 0.4 ms
Lead Channel Sensing Intrinsic Amplitude: 19.5 mV
Lead Channel Sensing Intrinsic Amplitude: 19.5 mV
Lead Channel Setting Pacing Amplitude: 2 V
Lead Channel Setting Pacing Pulse Width: 0.4 ms
Lead Channel Setting Sensing Sensitivity: 0.3 mV

## 2021-01-25 ENCOUNTER — Other Ambulatory Visit: Payer: Self-pay

## 2021-01-25 ENCOUNTER — Encounter (HOSPITAL_COMMUNITY)
Admission: RE | Admit: 2021-01-25 | Discharge: 2021-01-25 | Disposition: A | Discharge status: Home / Self Care | Payer: Medicare Other | Source: Ambulatory Visit | Attending: Cardiology | Admitting: Cardiology

## 2021-01-25 DIAGNOSIS — Z452 Encounter for adjustment and management of vascular access device: Secondary | ICD-10-CM | POA: Diagnosis not present

## 2021-01-25 MED ORDER — ALTEPLASE 2 MG IJ SOLR
2.0000 mg | Freq: Once | INTRAMUSCULAR | Status: AC
  Administered 2021-01-25: 2 mg

## 2021-01-25 MED ORDER — ALTEPLASE 2 MG IJ SOLR
INTRAMUSCULAR | Status: AC
  Filled 2021-01-25: qty 2

## 2021-01-25 NOTE — Progress Notes (Signed)
PCP: Dorothyann Peng, NP Cardiology: Dr Gasper Sells HF Cardiology: Dr. Aundra Dubin  75 y.o. with history of chronic systolic CHF, paroxysmal atrial fibrillation, and type 2 diabetes presents for followup of CHF.  Patient has a long history of nonischemic cardiomyopathy, has Medtronic ICD.  Cath in 2/22 showed mild nonobstructive CAD.  Echo in 4/22 showed EF < 20%, severe LV dilation, moderately decreased RV systolic function with mild RV enlargement, severe biatrial enlargement, mild-moderate MR, no more than moderate AS. Recent workup has suggested no more than moderate aortic stenosis, not thought to be severe enough for TAVR.   Patient was admitted in 4/22 with acute on chronic systolic CHF and marked volume overload.  Co-ox was low and milrinone 0.25 was started.  He was extensively diuresed.  He had sustained VT terminated by ICD discharge while in the hospital.  Tikosyn was stopped and amiodarone was started. In the past, he had not tolerated amiodarone but has had no problem with it this time.  I tried to wean him off milrinone, but co-ox dropped to 49% so I restarted it, and he went home on milrinone.  We discussed LVAD while he was in the hospital and workup was completed.  He wanted to go home for some time to think about it.   He returns for followup today.  He remains on milrinone 0.25.  No further episodes of VT.  Weight is up about 7 lbs.  He has been following a relatively high sodium diet. No exertional dyspnea walking on flat ground or up a flight of stairs.  No orthopnea/PND.     Admitted 8/22 with severe hypokalemia, hyponatermia and AKI after taking 500 mg torsemide bid. He was treated with IV fluids and potassium repleted. AHF was consulted to assist with management. He required IV lasix for a/c CHF later in hospitalization and was discharged home on previous home dose diuretics.  Today he returns for post hospitalization HF follow up here with his friend. No exertional dyspnea walking  on flat ground, not moving around as much around the house. Denies CP, palpitations, dizziness, edema, or PND/Orthopnea. Appetite ok. No fever or chills. Weight at home stable Taking all medications. Asking about other HF medications he can try to get off of milrinone. Would like to have another echo to see if his heart is getting better.  Labs (5/22): K 3.6, Na 132, creatinine 1.05 => 1.11 => 0.97, AST 49, ALT 53, digoxin 0.9 Labs (8/22): K 3.6, creatinine 1.11  Medtronic device interrogation (personally reviewed): OptiVol below threshold, stable thoracic impedence, atrial fib/flutter since 01/16/21, no VT/VF.  ECG (personally reviewed): atrial flutter  PMH:  1. HTN 2. Type 2 diabetes 3. Atrial fibrillation: Paroxysmal.  Initially on dofetilide, now on amiodarone.  4. OSA: uses Bipap 5. VT: In 4/22 while on milrinone, now on amiodarone.  Recurrent episode in 5/22.  6. Chronic systolic CHF: Nonischemic cardiomyopathy. Medtronic ICD.  - LHC/RHC (2/22): Mild nonobstructive CAD, CI low at 2.04. Moderate aortic stenosis.  - Echo (4/22): EF < 20%, severe LV dilation, moderately decreased RV systolic function with mild RV enlargement, severe biatrial enlargement, mild-moderate MR, no more than moderate AS.  - RHC (on milrinone): mean RA 10, PA 64/23, mean PCWP 23, CI 2.89, PAPi 4.1.  - Co-ox 49% off milrinone.  7. Aortic stenosis: Moderate by echo and cath.  8. Elevated LFTs  Social History   Socioeconomic History   Marital status: Widowed    Spouse name: Not on file  Number of children: Not on file   Years of education: Not on file   Highest education level: Not on file  Occupational History   Not on file  Tobacco Use   Smoking status: Former   Smokeless tobacco: Never  Vaping Use   Vaping Use: Never used  Substance and Sexual Activity   Alcohol use: Not Currently   Drug use: Not Currently   Sexual activity: Not on file  Other Topics Concern   Not on file  Social History  Narrative   Not on file   Social Determinants of Health   Financial Resource Strain: Low Risk    Difficulty of Paying Living Expenses: Not very hard  Food Insecurity: No Food Insecurity   Worried About Running Out of Food in the Last Year: Never true   Ran Out of Food in the Last Year: Never true  Transportation Needs: No Transportation Needs   Lack of Transportation (Medical): No   Lack of Transportation (Non-Medical): No  Physical Activity: Inactive   Days of Exercise per Week: 0 days   Minutes of Exercise per Session: 0 min  Stress: No Stress Concern Present   Feeling of Stress : Not at all  Social Connections: Moderately Isolated   Frequency of Communication with Friends and Family: Once a week   Frequency of Social Gatherings with Friends and Family: More than three times a week   Attends Religious Services: Never   Marine scientist or Organizations: No   Attends Music therapist: Never   Marital Status: Living with partner  Intimate Partner Violence: Not At Risk   Fear of Current or Ex-Partner: No   Emotionally Abused: No   Physically Abused: No   Sexually Abused: No   Family History  Problem Relation Age of Onset   Heart attack Mother    High Cholesterol Mother    Asthma Father    Kidney disease Sister    Stroke Maternal Grandfather    Heart attack Paternal Grandfather    ROS: All systems reviewed and negative except as per HPI.   Current Outpatient Medications  Medication Sig Dispense Refill   acetaminophen (TYLENOL) 500 MG tablet Take 1,000 mg by mouth every 6 (six) hours as needed for headache or mild pain.     amiodarone (PACERONE) 200 MG tablet Take 200 mg by mouth daily.     Arginine (L-ARGININE-500) 500 MG CAPS Take 500 mg by mouth daily.     Ascorbic Acid (VITAMIN C) 1000 MG tablet Take 1,000 mg by mouth 2 (two) times daily.     atorvastatin (LIPITOR) 20 MG tablet Take 1 tablet (20 mg total) by mouth daily. 30 tablet 3   B Complex  Vitamins (B COMPLEX PO) Take 1 tablet by mouth daily.     Biotin 10 MG TABS Take 10 mg by mouth daily. 10 mg - 10,000 mcg     Blood Glucose Monitoring Suppl (ONE TOUCH ULTRA 2) w/Device KIT Use to monitor blood sugar 1 kit 0   budesonide-formoterol (SYMBICORT) 160-4.5 MCG/ACT inhaler Inhale 2 puffs into the lungs 2 (two) times daily. 30.6 g 3   CALCIUM PO Take 1 tablet by mouth 2 (two) times daily.     cholecalciferol (VITAMIN D3) 25 MCG (1000 UNIT) tablet Take 1,000 Units by mouth 2 (two) times daily.     CHROMIUM PO Take 1 tablet by mouth 2 (two) times daily.     dapagliflozin propanediol (FARXIGA) 10 MG TABS tablet Take  1 tablet (10 mg total) by mouth daily before breakfast. 28 tablet 0   digoxin (LANOXIN) 0.125 MG tablet Take 1 tablet (0.125 mg total) by mouth daily. 30 tablet 3   ELIQUIS 5 MG TABS tablet Take 1 tablet (5 mg total) by mouth 2 (two) times daily. 180 tablet 3   escitalopram (LEXAPRO) 10 MG tablet TAKE 1 TABLET(10 MG) BY MOUTH DAILY 90 tablet 0   levOCARNitine (L-CARNITINE) 500 MG TABS Take 500 mg by mouth daily.     Magnesium 400 MG TABS Take 400 mg by mouth daily. 60 tablet 3   metFORMIN (GLUCOPHAGE) 500 MG tablet Take 1-3 tablets (500-1,500 mg total) by mouth See admin instructions. Take one tablet (500 mg) by mouth daily with breakfast and three tablets (1500 mg) daily with supper     metolazone (ZAROXOLYN) 2.5 MG tablet Take 1 tablet (2.5 mg total) by mouth every Friday. 12 tablet 3   milrinone (PRIMACOR) 20 MG/100 ML SOLN infusion Inject 0.0243 mg/min into the vein continuous.     ONETOUCH ULTRA test strip Use as instructed to check blood sugars twice daily 600 each 3   potassium chloride SA (KLOR-CON) 20 MEQ tablet Take 2-3 tablets (40-60 mEq total) by mouth 2 (two) times daily. 67m in the morning and 627mat night. Take 2 extra tablets on metolazone days     spironolactone (ALDACTONE) 25 MG tablet Take 1 tablet (25 mg total) by mouth daily. 90 tablet 3   torsemide  (DEMADEX) 100 MG tablet Take 1 tablet (100 mg total) by mouth 2 (two) times daily. 60 tablet 3   vitamin E 180 MG (400 UNITS) capsule Take 400 Units by mouth daily.     Zinc 50 MG TABS Take 50 mg by mouth daily.     Zn-Pyg Afri-Nettle-Saw Palmet (SAW PALMETTO COMPLEX PO) Take 540 mg by mouth daily.     No current facility-administered medications for this encounter.   Wt Readings from Last 3 Encounters:  01/26/21 99.2 kg  01/21/21 98.4 kg  01/14/21 99.7 kg   BP 110/62   Pulse (!) 57   Wt 99.2 kg   SpO2 96%   BMI 33.27 kg/m  General:  NAD. No resp difficulty HEENT: Normal, very HOH Neck: Supple. JVP 6-7. Carotids 2+ bilat; no bruits. No lymphadenopathy or thryomegaly appreciated. Cor: PMI nondisplaced. Irregular rate & rhythm. No rubs, gallops or murmurs. Lungs: Clear Abdomen: Obese, nontender, nondistended. No hepatosplenomegaly. No bruits or masses. Good bowel sounds. Extremities: No cyanosis, clubbing, rash, edema; RUE PICC Neuro: Alert & oriented x 3, cranial nerves grossly intact. Moves all 4 extremities w/o difficulty. Affect pleasant.  Assessment/Plan: 1. Chronic systolic CHF: Long-standing NICM, [place] the [place] ICD.  Echo 09/28/20 with EF <20%, severe LV dilation, mild RV dilation with moderate RV dysfunction, mild-moderate MR, moderate-severe TR, severe biatrial enlargement, no more than moderate AS.  Last cath in 2/22 showed nonobstructive mild CAD, elevated right and left heart filling pressures with low CI 2.04; AS no more than moderate.  Cause of cardiomyopathy uncertain, he is generally in NSR so unlikely to be tachy-mediated CMP.  Possible remote viral infection/viral myocarditis. NYHA class IV at 4/22 admission.  He was diuresed on milrinone, then milrinone stopped with drop in co-ox to 49%.  Milrinone restarted at 0.25, co-ox back up to 68% and discharged home on milrinone.   He is milrinone- dependent due to low output HF.  Long-term milrinone  is not a  great option for him given recurrent VT/VF with ICD shocks. - NYHA II-III. He is not volume overloaded today by exam or OptiVol. - Continue torsemide 100 mg bid + metolazone 2.5 every Friday. - Continue spiro 25 mg daily. - Continue dig 0.125 mg, check dig level today. - Continue Farxiga 10 mg daily. - No BB d/t low output HF. - BP soft. Will hold off on re-starting losartan today. - May need to consider midodrine. - He has low output HF.  RV function on RHC appeared adequate with PAPi 4.1, creatinine stable.  We have been discussing LVAD and he has had full workup, he has met an LVAD patient and was able to get his questions about LVAD answered.  He wanted to go home with milrinone to give him a break from the hospital.  We have had discussions about the limitations of milrinone and about how LVAD is really the only good option at this point to reduce mortality, this was reiterated this admission.   He still is not sure what he wants to do.  Fortunately, he has had no further VT.  He asks about any other medical options.  Unfortunately, they are few. We can try him on omecamtiv when it is available.  Barostimulation activation therapy has not been studied in patients with end stage CHF on inotropes. 2. Atrial fibrillation/flutter: Paroxysmal.  He is in atrial flutter today, per device interrogation since 01/16/21. Off Tikosyn after VF on 4/22.  - Increase amio to 200 mg bid (personally discussed with Dr. Aundra Dubin). Will plan DCCV if he does not chemically convert. - Continue Eliquis. 3. Aortic stenosis: Based on full evaluation (cath and echo), suspect no more than moderate aortic stenosis.  Per Dr Aundra Dubin - he would not benefit from TAVR at this point. 4. 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 No chest pain. - Continue statin.  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  Milrinone decreased to 0.25.  Tikosyn stopped and amiodarone started. No VT since 5/22.  Risk of recurrent VT/VF will  be high as long as he is on milrinone. - Increasing amio as above. 6. Hyponatremia: History of hypervolemic hyponatremia. - Keep fluid restricted. 7. Hx elevated LFTs:  ?Amiodarone, ?CHF.  Doubt related to statin. Viral hepatitis labs negative.  Abdominal US with possible early cirrhosis, no gallbladder.  - Recent LFTs have normalized. 8. Hypokalemia: Will check BMET and Mag today.   Followup on Monday with Dr. Aundra Dubin. Will need DCCV if not in NSR.   - Encouraged him to continue to think about LVAD as his window is likely limited.    [place] FNP 01/26/2021

## 2021-01-26 ENCOUNTER — Encounter (HOSPITAL_COMMUNITY): Payer: Self-pay

## 2021-01-26 ENCOUNTER — Ambulatory Visit (HOSPITAL_COMMUNITY)
Admission: RE | Admit: 2021-01-26 | Discharge: 2021-01-26 | Disposition: A | Discharge status: Home / Self Care | Payer: Medicare Other | Source: Ambulatory Visit | Attending: Family Medicine | Admitting: Family Medicine

## 2021-01-26 VITALS — BP 110/62 | HR 57 | Wt 218.8 lb

## 2021-01-26 DIAGNOSIS — E119 Type 2 diabetes mellitus without complications: Secondary | ICD-10-CM | POA: Insufficient documentation

## 2021-01-26 DIAGNOSIS — I2510 Atherosclerotic heart disease of native coronary artery without angina pectoris: Secondary | ICD-10-CM | POA: Insufficient documentation

## 2021-01-26 DIAGNOSIS — I110 Hypertensive heart disease with heart failure: Secondary | ICD-10-CM | POA: Diagnosis not present

## 2021-01-26 DIAGNOSIS — E871 Hypo-osmolality and hyponatremia: Secondary | ICD-10-CM | POA: Diagnosis not present

## 2021-01-26 DIAGNOSIS — I4892 Unspecified atrial flutter: Secondary | ICD-10-CM | POA: Diagnosis not present

## 2021-01-26 DIAGNOSIS — Z7951 Long term (current) use of inhaled steroids: Secondary | ICD-10-CM | POA: Diagnosis not present

## 2021-01-26 DIAGNOSIS — I5042 Chronic combined systolic (congestive) and diastolic (congestive) heart failure: Secondary | ICD-10-CM | POA: Diagnosis not present

## 2021-01-26 DIAGNOSIS — Z87891 Personal history of nicotine dependence: Secondary | ICD-10-CM | POA: Insufficient documentation

## 2021-01-26 DIAGNOSIS — I428 Other cardiomyopathies: Secondary | ICD-10-CM | POA: Diagnosis not present

## 2021-01-26 DIAGNOSIS — I472 Ventricular tachycardia: Secondary | ICD-10-CM

## 2021-01-26 DIAGNOSIS — Z8249 Family history of ischemic heart disease and other diseases of the circulatory system: Secondary | ICD-10-CM | POA: Diagnosis not present

## 2021-01-26 DIAGNOSIS — Z79899 Other long term (current) drug therapy: Secondary | ICD-10-CM | POA: Insufficient documentation

## 2021-01-26 DIAGNOSIS — I480 Paroxysmal atrial fibrillation: Secondary | ICD-10-CM | POA: Insufficient documentation

## 2021-01-26 DIAGNOSIS — I5022 Chronic systolic (congestive) heart failure: Secondary | ICD-10-CM | POA: Insufficient documentation

## 2021-01-26 DIAGNOSIS — E876 Hypokalemia: Secondary | ICD-10-CM | POA: Insufficient documentation

## 2021-01-26 DIAGNOSIS — Z95810 Presence of automatic (implantable) cardiac defibrillator: Secondary | ICD-10-CM

## 2021-01-26 DIAGNOSIS — R7989 Other specified abnormal findings of blood chemistry: Secondary | ICD-10-CM

## 2021-01-26 DIAGNOSIS — Z7984 Long term (current) use of oral hypoglycemic drugs: Secondary | ICD-10-CM | POA: Insufficient documentation

## 2021-01-26 DIAGNOSIS — I4729 Other ventricular tachycardia: Secondary | ICD-10-CM

## 2021-01-26 DIAGNOSIS — Z7901 Long term (current) use of anticoagulants: Secondary | ICD-10-CM | POA: Diagnosis not present

## 2021-01-26 DIAGNOSIS — I350 Nonrheumatic aortic (valve) stenosis: Secondary | ICD-10-CM

## 2021-01-26 LAB — MAGNESIUM: Magnesium: 2.4 mg/dL (ref 1.7–2.4)

## 2021-01-26 LAB — BASIC METABOLIC PANEL
Anion gap: 12 (ref 5–15)
BUN: 27 mg/dL — ABNORMAL HIGH (ref 8–23)
CO2: 27 mmol/L (ref 22–32)
Calcium: 8.6 mg/dL — ABNORMAL LOW (ref 8.9–10.3)
Chloride: 92 mmol/L — ABNORMAL LOW (ref 98–111)
Creatinine, Ser: 1.03 mg/dL (ref 0.61–1.24)
GFR, Estimated: >60 mL/min (ref >60)
Glucose, Bld: 175 mg/dL — ABNORMAL HIGH (ref 70–99)
Potassium: 3.4 mmol/L — ABNORMAL LOW (ref 3.5–5.1)
Sodium: 131 mmol/L — ABNORMAL LOW (ref 135–145)

## 2021-01-26 MED ORDER — AMIODARONE HCL 200 MG PO TABS: 200.0000 mg | ORAL_TABLET | Freq: Two times a day (BID) | ORAL | 3 refills | Status: DC

## 2021-01-26 NOTE — Patient Instructions (Signed)
INCREASE Amiodarone to 200 mg, one tab twice aday  Labs today We will only contact you if something comes back abnormal or we need to make some changes. Otherwise no news is good news!  Your physician recommends that you schedule a follow-up appointment in: 2 days for nurse visit   If you have any questions or concerns before your next appointment please send [place] a message through [place] or call our office at [phone].    TO LEAVE A MESSAGE FOR THE NURSE SELECT OPTION 2, PLEASE LEAVE A MESSAGE INCLUDING: YOUR NAME DATE OF BIRTH CALL BACK NUMBER REASON FOR CALL**this is important as we prioritize the call backs  YOU WILL RECEIVE A CALL BACK THE SAME DAY AS LONG AS YOU CALL BEFORE 4:00 PM

## 2021-01-28 ENCOUNTER — Other Ambulatory Visit (HOSPITAL_COMMUNITY): Payer: Medicare Other

## 2021-01-31 ENCOUNTER — Other Ambulatory Visit: Payer: Self-pay

## 2021-01-31 ENCOUNTER — Ambulatory Visit (HOSPITAL_COMMUNITY)
Admission: RE | Admit: 2021-01-31 | Discharge: 2021-01-31 | Disposition: A | Discharge status: Home / Self Care | Payer: Medicare Other | Source: Ambulatory Visit | Attending: Cardiology | Admitting: Cardiology

## 2021-01-31 ENCOUNTER — Encounter (HOSPITAL_COMMUNITY): Payer: Self-pay | Admitting: Cardiology

## 2021-01-31 VITALS — BP 110/56 | HR 56 | Wt 218.2 lb

## 2021-01-31 DIAGNOSIS — E871 Hypo-osmolality and hyponatremia: Secondary | ICD-10-CM | POA: Insufficient documentation

## 2021-01-31 DIAGNOSIS — Z7901 Long term (current) use of anticoagulants: Secondary | ICD-10-CM | POA: Insufficient documentation

## 2021-01-31 DIAGNOSIS — I428 Other cardiomyopathies: Secondary | ICD-10-CM | POA: Diagnosis not present

## 2021-01-31 DIAGNOSIS — E119 Type 2 diabetes mellitus without complications: Secondary | ICD-10-CM | POA: Insufficient documentation

## 2021-01-31 DIAGNOSIS — Z8249 Family history of ischemic heart disease and other diseases of the circulatory system: Secondary | ICD-10-CM | POA: Diagnosis not present

## 2021-01-31 DIAGNOSIS — I4892 Unspecified atrial flutter: Secondary | ICD-10-CM | POA: Insufficient documentation

## 2021-01-31 DIAGNOSIS — I2510 Atherosclerotic heart disease of native coronary artery without angina pectoris: Secondary | ICD-10-CM | POA: Diagnosis not present

## 2021-01-31 DIAGNOSIS — Z79899 Other long term (current) drug therapy: Secondary | ICD-10-CM | POA: Insufficient documentation

## 2021-01-31 DIAGNOSIS — Z7951 Long term (current) use of inhaled steroids: Secondary | ICD-10-CM | POA: Diagnosis not present

## 2021-01-31 DIAGNOSIS — I480 Paroxysmal atrial fibrillation: Secondary | ICD-10-CM | POA: Insufficient documentation

## 2021-01-31 DIAGNOSIS — I5042 Chronic combined systolic (congestive) and diastolic (congestive) heart failure: Secondary | ICD-10-CM | POA: Insufficient documentation

## 2021-01-31 DIAGNOSIS — Z7984 Long term (current) use of oral hypoglycemic drugs: Secondary | ICD-10-CM | POA: Diagnosis not present

## 2021-01-31 DIAGNOSIS — Z87891 Personal history of nicotine dependence: Secondary | ICD-10-CM | POA: Diagnosis not present

## 2021-01-31 DIAGNOSIS — I350 Nonrheumatic aortic (valve) stenosis: Secondary | ICD-10-CM | POA: Diagnosis not present

## 2021-01-31 DIAGNOSIS — E876 Hypokalemia: Secondary | ICD-10-CM | POA: Insufficient documentation

## 2021-01-31 DIAGNOSIS — G4733 Obstructive sleep apnea (adult) (pediatric): Secondary | ICD-10-CM | POA: Insufficient documentation

## 2021-01-31 DIAGNOSIS — I454 Nonspecific intraventricular block: Secondary | ICD-10-CM | POA: Diagnosis not present

## 2021-01-31 DIAGNOSIS — I110 Hypertensive heart disease with heart failure: Secondary | ICD-10-CM | POA: Insufficient documentation

## 2021-01-31 LAB — COMPREHENSIVE METABOLIC PANEL
ALT: 27 U/L (ref 0–44)
AST: 32 U/L (ref 15–41)
Albumin: 3.9 g/dL (ref 3.5–5.0)
Alkaline Phosphatase: 54 U/L (ref 38–126)
Anion gap: 18 — ABNORMAL HIGH (ref 5–15)
BUN: 30 mg/dL — ABNORMAL HIGH (ref 8–23)
CO2: 29 mmol/L (ref 22–32)
Calcium: 9.2 mg/dL (ref 8.9–10.3)
Chloride: 85 mmol/L — ABNORMAL LOW (ref 98–111)
Creatinine, Ser: 1.09 mg/dL (ref 0.61–1.24)
GFR, Estimated: >60 mL/min (ref >60)
Glucose, Bld: 182 mg/dL — ABNORMAL HIGH (ref 70–99)
Potassium: 3 mmol/L — ABNORMAL LOW (ref 3.5–5.1)
Sodium: 132 mmol/L — ABNORMAL LOW (ref 135–145)
Total Bilirubin: 1.1 mg/dL (ref 0.3–1.2)
Total Protein: 7.5 g/dL (ref 6.5–8.1)

## 2021-01-31 LAB — CBC
HCT: 40.5 % (ref 39.0–52.0)
Hemoglobin: 14.1 g/dL (ref 13.0–17.0)
MCH: 32.2 pg (ref 26.0–34.0)
MCHC: 34.8 g/dL (ref 30.0–36.0)
MCV: 92.5 fL (ref 80.0–100.0)
Platelets: 317 10*3/uL (ref 150–400)
RBC: 4.38 MIL/uL (ref 4.22–5.81)
RDW: 15.7 % — ABNORMAL HIGH (ref 11.5–15.5)
WBC: 9.2 10*3/uL (ref 4.0–10.5)
nRBC: 0 % (ref 0.0–0.2)

## 2021-01-31 LAB — DIGOXIN LEVEL: Digoxin Level: 0.8 ng/mL (ref 0.8–2.0)

## 2021-01-31 LAB — TSH: TSH: 2.573 u[IU]/mL (ref 0.350–4.500)

## 2021-01-31 MED ORDER — LOSARTAN POTASSIUM 25 MG PO TABS: 12.5000 mg | ORAL_TABLET | Freq: Every day | ORAL | 3 refills | Status: DC

## 2021-01-31 MED ORDER — ONETOUCH ULTRA VI STRP: ORAL_STRIP | 3 refills | Status: AC

## 2021-01-31 NOTE — Progress Notes (Signed)
[facility] sent over fax for order test strips.

## 2021-01-31 NOTE — Patient Instructions (Signed)
EKG done today.  Labs done today. We will contact you only if your labs are abnormal.  START Losartan 12.5mg  (1/2 tablet) by mouth daily at bedtime.  No other medication changes were made. Please continue all current medications as prescribed.  Your physician recommends that you schedule a follow-up appointment in: 3 weeks with our [facility] here in our office.  If you have any questions or concerns before your next appointment please send [place] a message through [place] or call our office at [phone].    TO LEAVE A MESSAGE FOR THE NURSE SELECT OPTION 2, PLEASE LEAVE A MESSAGE INCLUDING: YOUR NAME DATE OF BIRTH CALL BACK NUMBER REASON FOR CALL**this is important as we prioritize the call backs  YOU WILL RECEIVE A CALL BACK THE SAME DAY AS LONG AS YOU CALL BEFORE 4:00 PM   Do the following things EVERYDAY: Weigh yourself in the morning before breakfast. Write it down and keep it in a log. Take your medicines as prescribed Eat low salt foods--Limit salt (sodium) to 2000 mg per day.  Stay as active as you can everyday Limit all fluids for the day to less than 2 liters   At the [facility], you and your health needs are our priority. As part of our continuing mission to provide you with exceptional heart care, we have created designated Provider Care Teams. These Care Teams include your primary Cardiologist (physician) and Advanced Practice Providers (APPs- Physician Assistants and Nurse Practitioners) who all work together to provide you with the care you need, when you need it.   You may see any of the following providers on your designated Care Team at your next follow up: Dr Arvilla Meres Dr Carron Curie, NP Robbie Lis, [place] Karle Plumber, PharmD   Please be sure to bring in all your medications bottles to every appointment.    You are scheduled for a Cardioversion on Thursday August 25th, 2022 with Dr. [name].  Please arrive at  the [facility] (Main Entrance A) at [facility]: [address] [place], [place] [address] at 11:30 am. (1 hour prior to procedure unless lab work is needed; if lab work is needed arrive 1.5 hours ahead)  DIET: Nothing to eat or drink after midnight except a sip of water with medications (see medication instructions below)  FYI: For your safety, and to allow [place] to monitor your vital signs accurately during the surgery/procedure we request that   if you have artificial nails, gel coating, SNS etc. Please have those removed prior to your surgery/procedure. Not having the nail coverings /polish removed may result in cancellation or delay of your surgery/procedure.  Medication Instructions: Hold Torsemide the day of your procedure.  You must have a responsible person to drive you home and stay in the waiting area during your procedure. Failure to do so could result in cancellation.  Bring your insurance cards.  *Special Note: Every effort is made to have your procedure done on time. Occasionally there are emergencies that occur at the hospital that may cause delays. Please be patient if a delay does occur.

## 2021-02-01 ENCOUNTER — Encounter: Payer: Self-pay | Admitting: Adult Health

## 2021-02-01 NOTE — H&P (View-Only) (Signed)
PCP: Nafziger, Cory, NP Cardiology: Dr Chandrasekhar HF Cardiology: Dr. Dragon Thrush  75 y.o. with history of chronic systolic CHF, paroxysmal atrial fibrillation, and type 2 diabetes presents for followup of CHF.  Patient has a long history of nonischemic cardiomyopathy, has Medtronic ICD.  Cath in 2/22 showed mild nonobstructive CAD.  Echo in 4/22 showed EF < 20%, severe LV dilation, moderately decreased RV systolic function with mild RV enlargement, severe biatrial enlargement, mild-moderate MR, no more than moderate AS. Recent workup has suggested no more than moderate aortic stenosis, not thought to be severe enough for TAVR.   Patient was admitted in 4/22 with acute on chronic systolic CHF and marked volume overload.  Co-ox was low and milrinone 0.25 was started.  He was extensively diuresed.  He had sustained VT terminated by ICD discharge while in the hospital.  Tikosyn was stopped and amiodarone was started. In the past, he had not tolerated amiodarone but has had no problem with it this time.  I tried to wean him off milrinone, but co-ox dropped to 49% so I restarted it, and he went home on milrinone.  We discussed LVAD while he was in the hospital and workup was completed.  He wanted to go home for some time to think about it.    Admitted 8/22 with severe hypokalemia, hyponatermia and AKI after taking 500 mg torsemide bid for about a week by accident. He was treated with IV fluids and potassium repleted. AHF was consulted to assist with management. He required IV lasix for a/c CHF later in hospitalization and was discharged home on previous home dose diuretics.  He returns today for followup of CHF.  At last appointment, he was noted to be in atrial flutter.  Amiodarone was increased to 200 mg bid and he remains in atrial flutter today.  He does not feel palpitations.  No dyspnea walking on flat ground generally, no problems walking in today.  Occasional mild lightheadedness if he stands too fast.  No  orthopnea/PND.  Weight is stable.   Labs (5/22): K 3.6, Na 132, creatinine 1.05 => 1.11 => 0.97, AST 49, ALT 53, digoxin 0.9 Labs (8/22): K 3.6 => 3.4, creatinine 1.11 => 1.03  Medtronic device interrogation (personally reviewed): Persistent atrial flutter, no VT, fluid index < threshold (stable thoracic impedance).   ECG (personally reviewed): atrial flutter at 57, IVCD 154 msec  PMH:  1. HTN 2. Type 2 diabetes 3. Atrial fibrillation/flutter: Paroxysmal.  Initially on dofetilide, now on amiodarone.  4. OSA: uses Bipap 5. VT: In 4/22 while on milrinone, now on amiodarone.  Recurrent episode in 5/22.  6. Chronic systolic CHF: Nonischemic cardiomyopathy. Medtronic ICD.  - LHC/RHC (2/22): Mild nonobstructive CAD, CI low at 2.04. Moderate aortic stenosis.  - Echo (4/22): EF < 20%, severe LV dilation, moderately decreased RV systolic function with mild RV enlargement, severe biatrial enlargement, mild-moderate MR, no more than moderate AS.  - RHC (on milrinone): mean RA 10, PA 64/23, mean PCWP 23, CI 2.89, PAPi 4.1.  - Co-ox 49% off milrinone.  7. Aortic stenosis: Moderate by echo and cath.  8. Elevated LFTs  Social History   Socioeconomic History   Marital status: Widowed    Spouse name: Not on file   Number of children: Not on file   Years of education: Not on file   Highest education level: Not on file  Occupational History   Not on file  Tobacco Use   Smoking status: Former   Smokeless tobacco:   Never  Vaping Use   Vaping Use: Never used  Substance and Sexual Activity   Alcohol use: Not Currently   Drug use: Not Currently   Sexual activity: Not on file  Other Topics Concern   Not on file  Social History Narrative   Not on file   Social Determinants of Health   Financial Resource Strain: Low Risk    Difficulty of Paying Living Expenses: Not very hard  Food Insecurity: No Food Insecurity   Worried About Running Out of Food in the Last Year: Never true   Ran Out of  Food in the Last Year: Never true  Transportation Needs: No Transportation Needs   Lack of Transportation (Medical): No   Lack of Transportation (Non-Medical): No  Physical Activity: Inactive   Days of Exercise per Week: 0 days   Minutes of Exercise per Session: 0 min  Stress: No Stress Concern Present   Feeling of Stress : Not at all  Social Connections: Moderately Isolated   Frequency of Communication with Friends and Family: Once a week   Frequency of Social Gatherings with Friends and Family: More than three times a week   Attends Religious Services: Never   Active Member of Clubs or Organizations: No   Attends Club or Organization Meetings: Never   Marital Status: Living with partner  Intimate Partner Violence: Not At Risk   Fear of Current or Ex-Partner: No   Emotionally Abused: No   Physically Abused: No   Sexually Abused: No   Family History  Problem Relation Age of Onset   Heart attack Mother    High Cholesterol Mother    Asthma Father    Kidney disease Sister    Stroke Maternal Grandfather    Heart attack Paternal Grandfather    ROS: All systems reviewed and negative except as per HPI.   Current Outpatient Medications  Medication Sig Dispense Refill   acetaminophen (TYLENOL) 500 MG tablet Take 1,000 mg by mouth every 6 (six) hours as needed for headache or mild pain.     amiodarone (PACERONE) 200 MG tablet Take 1 tablet (200 mg total) by mouth 2 (two) times daily. 60 tablet 3   Arginine (L-ARGININE-500) 500 MG CAPS Take 500 mg by mouth daily.     Ascorbic Acid (VITAMIN C) 1000 MG tablet Take 1,000 mg by mouth 2 (two) times daily.     atorvastatin (LIPITOR) 20 MG tablet Take 1 tablet (20 mg total) by mouth daily. 30 tablet 3   B Complex Vitamins (B COMPLEX PO) Take 1 tablet by mouth daily.     Biotin 10 MG TABS Take 10 mg by mouth daily. 10 mg - 10,000 mcg     Blood Glucose Monitoring Suppl (ONE TOUCH ULTRA 2) w/Device KIT Use to monitor blood sugar 1 kit 0    budesonide-formoterol (SYMBICORT) 160-4.5 MCG/ACT inhaler Inhale 2 puffs into the lungs 2 (two) times daily. 30.6 g 3   CALCIUM PO Take 1 tablet by mouth 2 (two) times daily.     cholecalciferol (VITAMIN D3) 25 MCG (1000 UNIT) tablet Take 1,000 Units by mouth 2 (two) times daily.     CHROMIUM PO Take 1 tablet by mouth 2 (two) times daily.     dapagliflozin propanediol (FARXIGA) 10 MG TABS tablet Take 1 tablet (10 mg total) by mouth daily before breakfast. 28 tablet 0   digoxin (LANOXIN) 0.125 MG tablet Take 1 tablet (0.125 mg total) by mouth daily. 30 tablet 3   ELIQUIS   5 MG TABS tablet Take 1 tablet (5 mg total) by mouth 2 (two) times daily. 180 tablet 3   escitalopram (LEXAPRO) 10 MG tablet TAKE 1 TABLET(10 MG) BY MOUTH DAILY 90 tablet 0   levOCARNitine (L-CARNITINE) 500 MG TABS Take 500 mg by mouth daily.     losartan (COZAAR) 25 MG tablet Take 0.5 tablets (12.5 mg total) by mouth at bedtime. 45 tablet 3   Magnesium 400 MG TABS Take 400 mg by mouth daily. 60 tablet 3   metFORMIN (GLUCOPHAGE) 500 MG tablet Take 1-3 tablets (500-1,500 mg total) by mouth See admin instructions. Take one tablet (500 mg) by mouth daily with breakfast and three tablets (1500 mg) daily with supper     metolazone (ZAROXOLYN) 2.5 MG tablet Take 1 tablet (2.5 mg total) by mouth every Friday. 12 tablet 3   milrinone (PRIMACOR) 20 MG/100 ML SOLN infusion Inject 0.0243 mg/min into the vein continuous.     ONETOUCH ULTRA test strip Use as instructed to check blood sugars twice daily 600 each 3   potassium chloride SA (KLOR-CON) 20 MEQ tablet Take 2-3 tablets (40-60 mEq total) by mouth 2 (two) times daily. 40mg in the morning and 60mg at night. Take 2 extra tablets on metolazone days     spironolactone (ALDACTONE) 25 MG tablet Take 1 tablet (25 mg total) by mouth daily. 90 tablet 3   torsemide (DEMADEX) 100 MG tablet Take 1 tablet (100 mg total) by mouth 2 (two) times daily. 60 tablet 3   vitamin E 180 MG (400 UNITS) capsule  Take 400 Units by mouth daily.     Zinc 50 MG TABS Take 50 mg by mouth daily.     Zn-Pyg Afri-Nettle-Saw Palmet (SAW PALMETTO COMPLEX PO) Take 540 mg by mouth daily.     No current facility-administered medications for this encounter.   Wt Readings from Last 3 Encounters:  01/31/21 99 kg (218 lb 3.2 oz)  01/26/21 99.2 kg (218 lb 12.8 oz)  01/21/21 98.4 kg (217 lb)   BP (!) 110/56   Pulse (!) 56   Wt 99 kg (218 lb 3.2 oz)   SpO2 96%   BMI 33.18 kg/m  General: NAD Neck: No JVD, no thyromegaly or thyroid nodule.  Lungs: Clear to auscultation bilaterally with normal respiratory effort. CV: Nondisplaced PMI.  Heart irregular S1/S2, no S3/S4, 2/6 SEM RUSB.  No peripheral edema.  No carotid bruit.  Normal pedal pulses.  Abdomen: Soft, nontender, no hepatosplenomegaly, no distention.  Skin: Intact without lesions or rashes.  Neurologic: Alert and oriented x 3.  Psych: Normal affect. Extremities: No clubbing or cyanosis.  HEENT: Normal.   Assessment/Plan: 1. Chronic systolic CHF: Long-standing NICM, followed in the past in [place].  Has MDT ICD.  Echo 09/28/20 with EF <20%, severe LV dilation, mild RV dilation with moderate RV dysfunction, mild-moderate MR, moderate-severe TR, severe biatrial enlargement, no more than moderate AS.  Last cath in 2/22 showed nonobstructive mild CAD, elevated right and left heart filling pressures with low CI 2.04; AS no more than moderate.  Cause of cardiomyopathy uncertain, he is generally in NSR so unlikely to be tachy-mediated CMP.  Possible remote viral infection/viral myocarditis. NYHA class IV at 4/22 admission.  He was diuresed on milrinone, then milrinone stopped with drop in co-ox to 49%.  Milrinone restarted at 0.25, co-ox back up to 68% and discharged home on milrinone.   He is milrinone- dependent due to low output HF.  Long-term milrinone is not   a great option for him given recurrent VT/VF with ICD shocks.  NYHA class II on milrinone, not volume  overloaded by exam or Optivol.  - Continue torsemide 100 mg bid + metolazone 2.5 every Friday.  BMET today.  - Continue spiro 25 mg daily. - Continue dig 0.125 mg, check dig level today. - Continue Farxiga 10 mg daily. - No beta blocker with low output.  - I will let him restart losartan 12.5 mg qhs, BP stable today. - He has low output HF.  RV function on RHC appeared adequate with PAPi 4.1, creatinine stable.  We have been discussing LVAD and he has had full workup, he has met an LVAD patient and was able to get his questions about LVAD answered.  He wanted to go home with milrinone to give him a break from the hospital.  We have had discussions about the limitations of milrinone and about how LVAD is really the only good option at this point to reduce mortality, this was reiterated today.  He still is not sure what he wants to do.  Fortunately, he has had no further VT.  There are few other options.  We can try him on omecamtiv when it is available.  Barostimulation activation therapy has not been studied in patients with end stage CHF on inotropes. 2. Atrial fibrillation/flutter: Paroxysmal.  He is in atrial flutter still today (since 8/7 per device interrogation). Off Tikosyn after VF on 4/22.  - Continue amiodarone 200 mg bid until cardioversion, check LFTs and TSH today.  He will need a regular eye exam.  - Continue Eliquis, he has not missed any doses. - I will arrange for DCCV.  We discussed risks/benefits and he agrees to procedure.  3. Aortic stenosis: Based on full evaluation (cath and echo), suspect no more than moderate aortic stenosis.  He would not benefit from TAVR at this point. 4. 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 No chest pain. - Continue statin.  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  Milrinone decreased to 0.25.  Tikosyn stopped and amiodarone started. No VT since 5/22.  Risk of recurrent VT/VF will be high as long as he is on milrinone. - He is on amiodarone.   6. Hyponatremia: History of hypervolemic hyponatremia. - Keep fluid restricted.  - BMET today. 7. Hx elevated LFTs:  ?Amiodarone, ?CHF.  Doubt related to statin. Viral hepatitis labs negative.  Abdominal US with possible early cirrhosis, no gallbladder.  - Recent LFTs have normalized, check today. 8. Hypokalemia: Will check BMET and Mag today.   Followup in 3 wks with APP .  Encouraged him to continue to think about LVAD as his window is likely limited.    Loralie Champagne  02/01/2021

## 2021-02-01 NOTE — Telephone Encounter (Signed)
Please advise 

## 2021-02-01 NOTE — Progress Notes (Signed)
PCP: Dorothyann Peng, NP Cardiology: Dr Gasper Sells HF Cardiology: Dr. Aundra Dubin  75 y.o. with history of chronic systolic CHF, paroxysmal atrial fibrillation, and type 2 diabetes presents for followup of CHF.  Patient has a long history of nonischemic cardiomyopathy, has Medtronic ICD.  Cath in 2/22 showed mild nonobstructive CAD.  Echo in 4/22 showed EF < 20%, severe LV dilation, moderately decreased RV systolic function with mild RV enlargement, severe biatrial enlargement, mild-moderate MR, no more than moderate AS. Recent workup has suggested no more than moderate aortic stenosis, not thought to be severe enough for TAVR.   Patient was admitted in 4/22 with acute on chronic systolic CHF and marked volume overload.  Co-ox was low and milrinone 0.25 was started.  He was extensively diuresed.  He had sustained VT terminated by ICD discharge while in the hospital.  Tikosyn was stopped and amiodarone was started. In the past, he had not tolerated amiodarone but has had no problem with it this time.  I tried to wean him off milrinone, but co-ox dropped to 49% so I restarted it, and he went home on milrinone.  We discussed LVAD while he was in the hospital and workup was completed.  He wanted to go home for some time to think about it.    Admitted 8/22 with severe hypokalemia, hyponatermia and AKI after taking 500 mg torsemide bid for about a week by accident. He was treated with IV fluids and potassium repleted. AHF was consulted to assist with management. He required IV lasix for a/c CHF later in hospitalization and was discharged home on previous home dose diuretics.  He returns today for followup of CHF.  At last appointment, he was noted to be in atrial flutter.  Amiodarone was increased to 200 mg bid and he remains in atrial flutter today.  He does not feel palpitations.  No dyspnea walking on flat ground generally, no problems walking in today.  Occasional mild lightheadedness if he stands too fast.  No  orthopnea/PND.  Weight is stable.   Labs (5/22): K 3.6, Na 132, creatinine 1.05 => 1.11 => 0.97, AST 49, ALT 53, digoxin 0.9 Labs (8/22): K 3.6 => 3.4, creatinine 1.11 => 1.03  Medtronic device interrogation (personally reviewed): Persistent atrial flutter, no VT, fluid index < threshold (stable thoracic impedance).   ECG (personally reviewed): atrial flutter at 57, IVCD 154 msec  PMH:  1. HTN 2. Type 2 diabetes 3. Atrial fibrillation/flutter: Paroxysmal.  Initially on dofetilide, now on amiodarone.  4. OSA: uses Bipap 5. VT: In 4/22 while on milrinone, now on amiodarone.  Recurrent episode in 5/22.  6. Chronic systolic CHF: Nonischemic cardiomyopathy. Medtronic ICD.  - LHC/RHC (2/22): Mild nonobstructive CAD, CI low at 2.04. Moderate aortic stenosis.  - Echo (4/22): EF < 20%, severe LV dilation, moderately decreased RV systolic function with mild RV enlargement, severe biatrial enlargement, mild-moderate MR, no more than moderate AS.  - RHC (on milrinone): mean RA 10, PA 64/23, mean PCWP 23, CI 2.89, PAPi 4.1.  - Co-ox 49% off milrinone.  7. Aortic stenosis: Moderate by echo and cath.  8. Elevated LFTs  Social History   Socioeconomic History   Marital status: Widowed    Spouse name: Not on file   Number of children: Not on file   Years of education: Not on file   Highest education level: Not on file  Occupational History   Not on file  Tobacco Use   Smoking status: Former   Smokeless tobacco:  Never  Vaping Use   Vaping Use: Never used  Substance and Sexual Activity   Alcohol use: Not Currently   Drug use: Not Currently   Sexual activity: Not on file  Other Topics Concern   Not on file  Social History Narrative   Not on file   Social Determinants of Health   Financial Resource Strain: Low Risk    Difficulty of Paying Living Expenses: Not very hard  Food Insecurity: No Food Insecurity   Worried About Running Out of Food in the Last Year: Never true   Ran Out of  Food in the Last Year: Never true  Transportation Needs: No Transportation Needs   Lack of Transportation (Medical): No   Lack of Transportation (Non-Medical): No  Physical Activity: Inactive   Days of Exercise per Week: 0 days   Minutes of Exercise per Session: 0 min  Stress: No Stress Concern Present   Feeling of Stress : Not at all  Social Connections: Moderately Isolated   Frequency of Communication with Friends and Family: Once a week   Frequency of Social Gatherings with Friends and Family: More than three times a week   Attends Religious Services: Never   Marine scientist or Organizations: No   Attends Music therapist: Never   Marital Status: Living with partner  Intimate Partner Violence: Not At Risk   Fear of Current or Ex-Partner: No   Emotionally Abused: No   Physically Abused: No   Sexually Abused: No   Family History  Problem Relation Age of Onset   Heart attack Mother    High Cholesterol Mother    Asthma Father    Kidney disease Sister    Stroke Maternal Grandfather    Heart attack Paternal Grandfather    ROS: All systems reviewed and negative except as per HPI.   Current Outpatient Medications  Medication Sig Dispense Refill   acetaminophen (TYLENOL) 500 MG tablet Take 1,000 mg by mouth every 6 (six) hours as needed for headache or mild pain.     amiodarone (PACERONE) 200 MG tablet Take 1 tablet (200 mg total) by mouth 2 (two) times daily. 60 tablet 3   Arginine (L-ARGININE-500) 500 MG CAPS Take 500 mg by mouth daily.     Ascorbic Acid (VITAMIN C) 1000 MG tablet Take 1,000 mg by mouth 2 (two) times daily.     atorvastatin (LIPITOR) 20 MG tablet Take 1 tablet (20 mg total) by mouth daily. 30 tablet 3   B Complex Vitamins (B COMPLEX PO) Take 1 tablet by mouth daily.     Biotin 10 MG TABS Take 10 mg by mouth daily. 10 mg - 10,000 mcg     Blood Glucose Monitoring Suppl (ONE TOUCH ULTRA 2) w/Device KIT Use to monitor blood sugar 1 kit 0    budesonide-formoterol (SYMBICORT) 160-4.5 MCG/ACT inhaler Inhale 2 puffs into the lungs 2 (two) times daily. 30.6 g 3   CALCIUM PO Take 1 tablet by mouth 2 (two) times daily.     cholecalciferol (VITAMIN D3) 25 MCG (1000 UNIT) tablet Take 1,000 Units by mouth 2 (two) times daily.     CHROMIUM PO Take 1 tablet by mouth 2 (two) times daily.     dapagliflozin propanediol (FARXIGA) 10 MG TABS tablet Take 1 tablet (10 mg total) by mouth daily before breakfast. 28 tablet 0   digoxin (LANOXIN) 0.125 MG tablet Take 1 tablet (0.125 mg total) by mouth daily. 30 tablet 3   ELIQUIS  5 MG TABS tablet Take 1 tablet (5 mg total) by mouth 2 (two) times daily. 180 tablet 3   escitalopram (LEXAPRO) 10 MG tablet TAKE 1 TABLET(10 MG) BY MOUTH DAILY 90 tablet 0   levOCARNitine (L-CARNITINE) 500 MG TABS Take 500 mg by mouth daily.     losartan (COZAAR) 25 MG tablet Take 0.5 tablets (12.5 mg total) by mouth at bedtime. 45 tablet 3   Magnesium 400 MG TABS Take 400 mg by mouth daily. 60 tablet 3   metFORMIN (GLUCOPHAGE) 500 MG tablet Take 1-3 tablets (500-1,500 mg total) by mouth See admin instructions. Take one tablet (500 mg) by mouth daily with breakfast and three tablets (1500 mg) daily with supper     metolazone (ZAROXOLYN) 2.5 MG tablet Take 1 tablet (2.5 mg total) by mouth every Friday. 12 tablet 3   milrinone (PRIMACOR) 20 MG/100 ML SOLN infusion Inject 0.0243 mg/min into the vein continuous.     ONETOUCH ULTRA test strip Use as instructed to check blood sugars twice daily 600 each 3   potassium chloride SA (KLOR-CON) 20 MEQ tablet Take 2-3 tablets (40-60 mEq total) by mouth 2 (two) times daily. 96m in the morning and 671mat night. Take 2 extra tablets on metolazone days     spironolactone (ALDACTONE) 25 MG tablet Take 1 tablet (25 mg total) by mouth daily. 90 tablet 3   torsemide (DEMADEX) 100 MG tablet Take 1 tablet (100 mg total) by mouth 2 (two) times daily. 60 tablet 3   vitamin E 180 MG (400 UNITS) capsule  Take 400 Units by mouth daily.     Zinc 50 MG TABS Take 50 mg by mouth daily.     Zn-Pyg Afri-Nettle-Saw Palmet (SAW PALMETTO COMPLEX PO) Take 540 mg by mouth daily.     No current facility-administered medications for this encounter.   Wt Readings from Last 3 Encounters:  01/31/21 99 kg (218 lb 3.2 oz)  01/26/21 99.2 kg (218 lb 12.8 oz)  01/21/21 98.4 kg (217 lb)   BP (!) 110/56   Pulse (!) 56   Wt 99 kg (218 lb 3.2 oz)   SpO2 96%   BMI 33.18 kg/m  General: NAD Neck: No JVD, no thyromegaly or thyroid nodule.  Lungs: Clear to auscultation bilaterally with normal respiratory effort. CV: Nondisplaced PMI.  Heart irregular S1/S2, no S3/S4, 2/6 SEM RUSB.  No peripheral edema.  No carotid bruit.  Normal pedal pulses.  Abdomen: Soft, nontender, no hepatosplenomegaly, no distention.  Skin: Intact without lesions or rashes.  Neurologic: Alert and oriented x 3.  Psych: Normal affect. Extremities: No clubbing or cyanosis.  HEENT: Normal.   Assessment/Plan: 1. Chronic systolic CHF: Long-standing NICM[place].  Echo 09/28/20 with EF <20%, severe LV dilation, mild RV dilation with moderate RV dysfunction, mild-moderate MR, moderate-severe TR, severe biatrial enlargement, no more than moderate AS.  Last cath in 2/22 showed nonobstructive mild CAD, elevated right and left heart filling pressures with low CI 2.04; AS no more than moderate.  Cause of cardiomyopathy uncertain, he is generally in NSR so unlikely to be tachy-mediated CMP.  Possible remote viral infection/viral myocarditis. NYHA class IV at 4/22 admission.  He was diuresed on milrinone, then milrinone stopped with drop in co-ox to 49%.  Milrinone restarted at 0.25, co-ox back up to 68% and discharged home on milrinone.   He is milrinone- dependent due to low output HF.  Long-term milrinone is not  a great option for him given recurrent VT/VF with ICD shocks.  NYHA class II on milrinone, not volume  overloaded by exam or Optivol.  - Continue torsemide 100 mg bid + metolazone 2.5 every Friday.  BMET today.  - Continue spiro 25 mg daily. - Continue dig 0.125 mg, check dig level today. - Continue Farxiga 10 mg daily. - No beta blocker with low output.  - I will let him restart losartan 12.5 mg qhs, BP stable today. - He has low output HF.  RV function on RHC appeared adequate with PAPi 4.1, creatinine stable.  We have been discussing LVAD and he has had full workup, he has met an LVAD patient and was able to get his questions about LVAD answered.  He wanted to go home with milrinone to give him a break from the hospital.  We have had discussions about the limitations of milrinone and about how LVAD is really the only good option at this point to reduce mortality, this was reiterated today.  He still is not sure what he wants to do.  Fortunately, he has had no further VT.  There are few other options.  We can try him on omecamtiv when it is available.  Barostimulation activation therapy has not been studied in patients with end stage CHF on inotropes. 2. Atrial fibrillation/flutter: Paroxysmal.  He is in atrial flutter still today (since 8/7 per device interrogation). Off Tikosyn after VF on 4/22.  - Continue amiodarone 200 mg bid until cardioversion, check LFTs and TSH today.  He will need a regular eye exam.  - Continue Eliquis, he has not missed any doses. - I will arrange for DCCV.  We discussed risks/benefits and he agrees to procedure.  3. Aortic stenosis: Based on full evaluation (cath and echo), suspect no more than moderate aortic stenosis.  He would not benefit from TAVR at this point. 4. 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 No chest pain. - Continue statin.  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  Milrinone decreased to 0.25.  Tikosyn stopped and amiodarone started. No VT since 5/22.  Risk of recurrent VT/VF will be high as long as he is on milrinone. - He is on amiodarone.   6. Hyponatremia: History of hypervolemic hyponatremia. - Keep fluid restricted.  - BMET today. 7. Hx elevated LFTs:  ?Amiodarone, ?CHF.  Doubt related to statin. Viral hepatitis labs negative.  Abdominal US with possible early cirrhosis, no gallbladder.  - Recent LFTs have normalized, check today. 8. Hypokalemia: Will check BMET and Mag today.   Followup in 3 wks with APP .  Encouraged him to continue to think about LVAD as his window is likely limited.    Chad Wilkerson  02/01/2021

## 2021-02-02 ENCOUNTER — Other Ambulatory Visit (HOSPITAL_COMMUNITY): Payer: Self-pay | Admitting: *Deleted

## 2021-02-03 ENCOUNTER — Ambulatory Visit (HOSPITAL_COMMUNITY)
Admission: RE | Admit: 2021-02-03 | Discharge: 2021-02-03 | Disposition: A | Discharge status: Home / Self Care | Payer: Medicare Other | Attending: Cardiology | Admitting: Cardiology

## 2021-02-03 ENCOUNTER — Encounter (HOSPITAL_COMMUNITY)
Admission: RE | Disposition: A | Discharge status: Home / Self Care | Payer: Self-pay | Source: Home / Self Care | Attending: Cardiology

## 2021-02-03 ENCOUNTER — Ambulatory Visit (HOSPITAL_COMMUNITY): Payer: Medicare Other | Admitting: Certified Registered Nurse Anesthetist

## 2021-02-03 ENCOUNTER — Other Ambulatory Visit: Payer: Self-pay

## 2021-02-03 ENCOUNTER — Encounter (HOSPITAL_COMMUNITY): Payer: Self-pay | Admitting: Cardiology

## 2021-02-03 DIAGNOSIS — I480 Paroxysmal atrial fibrillation: Secondary | ICD-10-CM | POA: Insufficient documentation

## 2021-02-03 DIAGNOSIS — Z8249 Family history of ischemic heart disease and other diseases of the circulatory system: Secondary | ICD-10-CM | POA: Diagnosis not present

## 2021-02-03 DIAGNOSIS — I428 Other cardiomyopathies: Secondary | ICD-10-CM | POA: Diagnosis not present

## 2021-02-03 DIAGNOSIS — I110 Hypertensive heart disease with heart failure: Secondary | ICD-10-CM | POA: Diagnosis not present

## 2021-02-03 DIAGNOSIS — E119 Type 2 diabetes mellitus without complications: Secondary | ICD-10-CM | POA: Insufficient documentation

## 2021-02-03 DIAGNOSIS — Z7901 Long term (current) use of anticoagulants: Secondary | ICD-10-CM | POA: Diagnosis not present

## 2021-02-03 DIAGNOSIS — E876 Hypokalemia: Secondary | ICD-10-CM | POA: Diagnosis not present

## 2021-02-03 DIAGNOSIS — E871 Hypo-osmolality and hyponatremia: Secondary | ICD-10-CM | POA: Diagnosis not present

## 2021-02-03 DIAGNOSIS — I5022 Chronic systolic (congestive) heart failure: Secondary | ICD-10-CM | POA: Insufficient documentation

## 2021-02-03 DIAGNOSIS — I700 Atherosclerosis of aorta: Secondary | ICD-10-CM | POA: Insufficient documentation

## 2021-02-03 DIAGNOSIS — Z79899 Other long term (current) drug therapy: Secondary | ICD-10-CM | POA: Insufficient documentation

## 2021-02-03 DIAGNOSIS — I4891 Unspecified atrial fibrillation: Secondary | ICD-10-CM | POA: Diagnosis not present

## 2021-02-03 DIAGNOSIS — Z7951 Long term (current) use of inhaled steroids: Secondary | ICD-10-CM | POA: Insufficient documentation

## 2021-02-03 DIAGNOSIS — I4892 Unspecified atrial flutter: Secondary | ICD-10-CM | POA: Insufficient documentation

## 2021-02-03 DIAGNOSIS — Z7984 Long term (current) use of oral hypoglycemic drugs: Secondary | ICD-10-CM | POA: Diagnosis not present

## 2021-02-03 DIAGNOSIS — I2510 Atherosclerotic heart disease of native coronary artery without angina pectoris: Secondary | ICD-10-CM | POA: Diagnosis not present

## 2021-02-03 HISTORY — PX: CARDIOVERSION: SHX1299

## 2021-02-03 SURGERY — CARDIOVERSION
Anesthesia: General

## 2021-02-03 MED ORDER — SODIUM CHLORIDE 0.9 % IV SOLN: INTRAVENOUS | Status: DC | PRN

## 2021-02-03 MED ORDER — PHENYLEPHRINE 40 MCG/ML (10ML) SYRINGE FOR IV PUSH (FOR BLOOD PRESSURE SUPPORT)
PREFILLED_SYRINGE | INTRAVENOUS | Status: DC | PRN
  Administered 2021-02-03: 80 ug via INTRAVENOUS

## 2021-02-03 MED ORDER — PROPOFOL 10 MG/ML IV BOLUS
INTRAVENOUS | Status: DC | PRN
  Administered 2021-02-03: 50 mg via INTRAVENOUS

## 2021-02-03 MED ORDER — LIDOCAINE 2% (20 MG/ML) 5 ML SYRINGE
INTRAMUSCULAR | Status: DC | PRN
  Administered 2021-02-03: 100 mg via INTRAVENOUS

## 2021-02-03 NOTE — Anesthesia Procedure Notes (Signed)
Procedure Name: General with mask airway Date/Time: 02/03/2021 12:41 PM Performed by: Drema Pry, CRNA Pre-anesthesia Checklist: Patient identified, Emergency Drugs available, Suction available and Patient being monitored Patient Re-evaluated:Patient Re-evaluated prior to induction Oxygen Delivery Method: Ambu bag Preoxygenation: Pre-oxygenation with 100% oxygen Induction Type: IV induction Ventilation: Mask ventilation without difficulty

## 2021-02-03 NOTE — Transfer of Care (Signed)
Immediate Anesthesia Transfer of Care Note  Patient: Chad Wilkerson  Procedure(s) Performed: CARDIOVERSION  Patient Location: PACU and Endoscopy Unit  Anesthesia Type:General  Level of Consciousness: drowsy, patient cooperative and responds to stimulation  Airway & Oxygen Therapy: Patient Spontanous Breathing  Post-op Assessment: Report given to RN and Post -op Vital signs reviewed and stable  Post vital signs: Reviewed and stable  Last Vitals:  Vitals Value Taken Time  BP 115/67 02/03/21 1247  Temp    Pulse 66 02/03/21 1248  Resp 20 02/03/21 1248  SpO2 87 % 02/03/21 1248    Last Pain:  Vitals:   02/03/21 1148  TempSrc: Temporal  PainSc: 0-No pain         Complications: No notable events documented.

## 2021-02-03 NOTE — Interval H&P Note (Signed)
History and Physical Interval Note:  02/03/2021 12:37 PM  Chad Wilkerson  has presented today for surgery, with the diagnosis of afib.  The various methods of treatment have been discussed with the patient and family. After consideration of risks, benefits and other options for treatment, the patient has consented to  Procedure(s): CARDIOVERSION (N/A) as a surgical intervention.  The patient's history has been reviewed, patient examined, no change in status, stable for surgery.  I have reviewed the patient's chart and labs.  Questions were answered to the patient's satisfaction.     Pinki Rottman [employer]

## 2021-02-03 NOTE — Anesthesia Preprocedure Evaluation (Addendum)
Anesthesia Evaluation  Patient identified by MRN, date of birth, ID band Patient awake    Reviewed: Allergy & Precautions, NPO status , Patient's Chart, lab work & pertinent test results  Airway Mallampati: II  TM Distance: >3 FB Neck ROM: Full    Dental  (+) Teeth Intact   Pulmonary sleep apnea and Continuous Positive Airway Pressure Ventilation , former smoker,    Pulmonary exam normal        Cardiovascular hypertension, Pt. on medications +CHF  + dysrhythmias Atrial Fibrillation + Cardiac Defibrillator + Valvular Problems/Murmurs AS  Rhythm:Irregular Rate:Normal + Systolic murmurs    Neuro/Psych negative neurological ROS  negative psych ROS   GI/Hepatic negative GI ROS, Neg liver ROS,   Endo/Other  diabetes, Type 2, Oral Hypoglycemic Agents  Renal/GU ARFRenal disease  negative genitourinary   Musculoskeletal negative musculoskeletal ROS (+)   Abdominal (+)  Abdomen: soft. Bowel sounds: normal.  Peds  Hematology negative hematology ROS (+)   Anesthesia Other Findings   Reproductive/Obstetrics                           Anesthesia Physical Anesthesia Plan  ASA: 4  Anesthesia Plan: General   Post-op Pain Management:    Induction: Intravenous  PONV Risk Score and Plan: 2 and Propofol infusion and Treatment may vary due to age or medical condition  Airway Management Planned: Mask  Additional Equipment: None  Intra-op Plan:   Post-operative Plan:   Informed Consent: I have reviewed the patients History and Physical, chart, labs and discussed the procedure including the risks, benefits and alternatives for the proposed anesthesia with the patient or authorized representative who has indicated his/her understanding and acceptance.     Dental advisory given  Plan Discussed with: CRNA  Anesthesia Plan Comments: (Lab Results      Component                Value                Date                      NA                       132 (L)             01/31/2021                K                        3.0 (L)             01/31/2021                CO2                      29                  01/31/2021                GLUCOSE                  182 (H)             01/31/2021                BUN  30 (H)              01/31/2021                CREATININE               1.09                01/31/2021                CALCIUM                  9.2                 01/31/2021                GFRNONAA                 >60                 01/31/2021                GFRAA                    76                  08/04/2020           Lab Results      Component                Value               Date                      WBC                      9.2                 01/31/2021                HGB                      14.1                01/31/2021                HCT                      40.5                01/31/2021                MCV                      92.5                01/31/2021                PLT                      317                 01/31/2021           ECHO 04/22: 1. Left ventricular ejection fraction, by estimation, is <20%. The left  ventricle has severely decreased function. The left ventricle demonstrates  global hypokinesis. The left ventricular internal cavity size was severely  dilated. Left ventricular  diastolic parameters are consistent with Grade  III diastolic dysfunction  (restrictive).  2. Right ventricular systolic function is moderately reduced. The right  ventricular size is mildly enlarged. There is normal pulmonary artery  systolic pressure.  3. Left atrial size was severely dilated.  4. Right atrial size was severely dilated.  5. The mitral valve is grossly normal. Mild to moderate mitral valve  regurgitation.  6. Tricuspid valve regurgitation is moderate to severe.  7. The aortic valve is grossly normal. Aortic valve  regurgitation is not  visualized.  8. Aortic dilatation noted. There is mild dilatation of the ascending  aorta, measuring 41 mm. )        Anesthesia Quick Evaluation

## 2021-02-03 NOTE — Procedures (Signed)
Electrical Cardioversion Procedure Note Chad Wilkerson [id number] 15-Apr-1946  Procedure: Electrical Cardioversion Indications:  Atrial Fibrillation  Procedure Details Consent: Risks of procedure as well as the alternatives and risks of each were explained to the (patient/caregiver).  Consent for procedure obtained. Time Out: Verified patient identification, verified procedure, site/side was marked, verified correct patient position, special equipment/implants available, medications/allergies/relevent history reviewed, required imaging and test results available.  Performed  Patient placed on cardiac monitor, pulse oximetry, supplemental oxygen as necessary.  Sedation given:  Propofol per anesthesiology Pacer pads placed anterior and posterior chest.  Cardioverted 1 time(s).  Cardioverted at 200J.  Evaluation Findings: Post procedure EKG shows: NSR with 1st degree AVB Complications: None Patient did tolerate procedure well.   Chad Wilkerson 02/03/2021, 12:46 PM

## 2021-02-03 NOTE — Discharge Instructions (Signed)

## 2021-02-04 ENCOUNTER — Encounter (HOSPITAL_COMMUNITY): Payer: Self-pay | Admitting: Cardiology

## 2021-02-04 ENCOUNTER — Encounter: Payer: Self-pay | Admitting: Adult Health

## 2021-02-04 NOTE — Telephone Encounter (Signed)
Ok for GI referral?  

## 2021-02-04 NOTE — Anesthesia Postprocedure Evaluation (Signed)
Anesthesia Post Note  Patient: Chad Wilkerson  Procedure(s) Performed: CARDIOVERSION     Patient location during evaluation: Endoscopy Anesthesia Type: General Level of consciousness: awake and alert Pain management: pain level controlled Vital Signs Assessment: post-procedure vital signs reviewed and stable Respiratory status: spontaneous breathing, nonlabored ventilation, respiratory function stable and patient connected to nasal cannula oxygen Cardiovascular status: blood pressure returned to baseline and stable Postop Assessment: no apparent nausea or vomiting Anesthetic complications: no   No notable events documented.  Last Vitals:  Vitals:   02/03/21 1304 02/03/21 1313  BP: (!) 100/57 119/63  Pulse: 69 71  Resp: 18 (!) 23  Temp:    SpO2: 93% 100%    Last Pain:  Vitals:   02/03/21 1313  TempSrc:   PainSc: 0-No pain                 Earl Lites P Mehreen Azizi

## 2021-02-07 ENCOUNTER — Encounter: Payer: Self-pay | Admitting: Adult Health

## 2021-02-07 ENCOUNTER — Encounter (HOSPITAL_COMMUNITY): Payer: Self-pay

## 2021-02-08 ENCOUNTER — Encounter (HOSPITAL_COMMUNITY): Payer: Self-pay | Admitting: *Deleted

## 2021-02-08 ENCOUNTER — Telehealth (HOSPITAL_COMMUNITY): Payer: Self-pay | Admitting: *Deleted

## 2021-02-08 MED ORDER — POTASSIUM CHLORIDE CRYS ER 20 MEQ PO TBCR: 60.0000 meq | EXTENDED_RELEASE_TABLET | Freq: Two times a day (BID) | ORAL | Status: AC

## 2021-02-08 NOTE — Telephone Encounter (Signed)
-----   Message from [name], MD sent at 01/31/2021 10:44 PM EDT ----- Increase total daily KCl by 20 mEq and repeat BMET 10 days.

## 2021-02-08 NOTE — Telephone Encounter (Signed)
Unable to reach pt, mychart message sent

## 2021-02-11 NOTE — Progress Notes (Signed)
Remote ICD transmission.   

## 2021-02-13 ENCOUNTER — Encounter (HOSPITAL_COMMUNITY): Payer: Self-pay

## 2021-02-15 ENCOUNTER — Encounter (HOSPITAL_COMMUNITY): Payer: Self-pay

## 2021-02-15 MED ORDER — TORSEMIDE 100 MG PO TABS: 100.0000 mg | ORAL_TABLET | Freq: Two times a day (BID) | ORAL | 3 refills | Status: DC

## 2021-02-17 NOTE — Progress Notes (Addendum)
PCP: Dorothyann Peng, NP Cardiology: Dr Gasper Sells HF Cardiology: Dr. Aundra Dubin  75 y.o. with history of chronic systolic CHF, paroxysmal atrial fibrillation, and type 2 diabetes presents for followup of CHF.  Patient has a long history of nonischemic cardiomyopathy, has Medtronic ICD.  Cath in 2/22 showed mild nonobstructive CAD.  Echo in 4/22 showed EF < 20%, severe LV dilation, moderately decreased RV systolic function with mild RV enlargement, severe biatrial enlargement, mild-moderate MR, no more than moderate AS. Recent workup has suggested no more than moderate aortic stenosis, not thought to be severe enough for TAVR.   Patient was admitted in 4/22 with acute on chronic systolic CHF and marked volume overload.  Co-ox was low and milrinone 0.25 was started.  He was extensively diuresed.  He had sustained VT terminated by ICD discharge while in the hospital.  Tikosyn was stopped and amiodarone was started. In the past, he had not tolerated amiodarone but has had no problem with it this time.  I tried to wean him off milrinone, but co-ox dropped to 49% so I restarted it, and he went home on milrinone.  We discussed LVAD while he was in the hospital and workup was completed.  He wanted to go home for some time to think about it.    Admitted 8/22 with severe hypokalemia, hyponatermia and AKI after taking 500 mg torsemide bid for about a week by accident. He was treated with IV fluids and potassium repleted. AHF was consulted to assist with management. He required IV lasix for a/c CHF later in hospitalization and was discharged home on previous home dose diuretics.  S/p DCCV 02/03/21 to SR.  Today he returns for HF follow up. More SOB over last 2 days, poor energy. Continues to have mild lightheadedness with position chnages. Denies CP, edema, or PND/Orthopnea. Appetite poor, but eats a lot of fruit and soup at home currently. No fever or chills. Weight is up 7 lbs. Taking all medications. Wearing BiPap  nighty. He tells me he wants his PICC line out and his milrinone off. He has a house in [place] he would like to go to and get ready to sell. "I am not going to die, but if I do it's ok."  Labs (5/22): K 3.6, Na 132, creatinine 1.05 => 1.11 => 0.97, AST 49, ALT 53, digoxin 0.9 Labs (8/22): K 3.6 => 3.4, creatinine 1.11 => 1.03  Medtronic device interrogation (personally reviewed): OptiVol below threshold but rising, thoracic impedence down, 1 hr daily activity, no VT/VF.  ECG (personally reviewed): SR with 1st degree AVB PR 332 ms  PMH:  1. HTN 2. Type 2 diabetes 3. Atrial fibrillation/flutter: Paroxysmal.  Initially on dofetilide, now on amiodarone.  4. OSA: uses Bipap 5. VT: In 4/22 while on milrinone, now on amiodarone.  Recurrent episode in 5/22.  6. Chronic systolic CHF: Nonischemic cardiomyopathy. Medtronic ICD.  - LHC/RHC (2/22): Mild nonobstructive CAD, CI low at 2.04. Moderate aortic stenosis.  - Echo (4/22): EF < 20%, severe LV dilation, moderately decreased RV systolic function with mild RV enlargement, severe biatrial enlargement, mild-moderate MR, no more than moderate AS.  - RHC (on milrinone): mean RA 10, PA 64/23, mean PCWP 23, CI 2.89, PAPi 4.1.  - Co-ox 49% off milrinone.  7. Aortic stenosis: Moderate by echo and cath.  8. Elevated LFTs  Social History   Socioeconomic History   Marital status: Widowed    Spouse name: Not on file   Number of children: Not on file  Years of education: Not on file   Highest education level: Not on file  Occupational History   Not on file  Tobacco Use   Smoking status: Former   Smokeless tobacco: Never  Vaping Use   Vaping Use: Never used  Substance and Sexual Activity   Alcohol use: Not Currently   Drug use: Not Currently   Sexual activity: Not on file  Other Topics Concern   Not on file  Social History Narrative   Not on file   Social Determinants of Health   Financial Resource Strain: Low Risk    Difficulty of  Paying Living Expenses: Not very hard  Food Insecurity: No Food Insecurity   Worried About Running Out of Food in the Last Year: Never true   Ran Out of Food in the Last Year: Never true  Transportation Needs: No Transportation Needs   Lack of Transportation (Medical): No   Lack of Transportation (Non-Medical): No  Physical Activity: Inactive   Days of Exercise per Week: 0 days   Minutes of Exercise per Session: 0 min  Stress: No Stress Concern Present   Feeling of Stress : Not at all  Social Connections: Moderately Isolated   Frequency of Communication with Friends and Family: Once a week   Frequency of Social Gatherings with Friends and Family: More than three times a week   Attends Religious Services: Never   Marine scientist or Organizations: No   Attends Music therapist: Never   Marital Status: Living with partner  Intimate Partner Violence: Not At Risk   Fear of Current or Ex-Partner: No   Emotionally Abused: No   Physically Abused: No   Sexually Abused: No   Family History  Problem Relation Age of Onset   Heart attack Mother    High Cholesterol Mother    Asthma Father    Kidney disease Sister    Stroke Maternal Grandfather    Heart attack Paternal Grandfather    ROS: All systems reviewed and negative except as per HPI.   Current Outpatient Medications  Medication Sig Dispense Refill   acetaminophen (TYLENOL) 500 MG tablet Take 1,000 mg by mouth every 6 (six) hours as needed for headache or mild pain.     amiodarone (PACERONE) 200 MG tablet Take 200 mg by mouth daily.     Arginine (L-ARGININE-500) 500 MG CAPS Take 500 mg by mouth daily.     Ascorbic Acid (VITAMIN C) 1000 MG tablet Take 1,000 mg by mouth 2 (two) times daily.     atorvastatin (LIPITOR) 20 MG tablet Take 1 tablet (20 mg total) by mouth daily. 30 tablet 3   B Complex Vitamins (B COMPLEX PO) Take 1 tablet by mouth daily.     Biotin 10 MG TABS Take 10 mg by mouth daily. 10 mg - 10,000  mcg     Blood Glucose Monitoring Suppl (ONE TOUCH ULTRA 2) w/Device KIT Use to monitor blood sugar 1 kit 0   budesonide-formoterol (SYMBICORT) 160-4.5 MCG/ACT inhaler Inhale 2 puffs into the lungs 2 (two) times daily. 30.6 g 3   CALCIUM PO Take 1 tablet by mouth 2 (two) times daily.     cholecalciferol (VITAMIN D3) 25 MCG (1000 UNIT) tablet Take 1,000 Units by mouth 2 (two) times daily.     CHROMIUM PO Take 1 tablet by mouth 2 (two) times daily.     dapagliflozin propanediol (FARXIGA) 10 MG TABS tablet Take 1 tablet (10 mg total) by mouth daily  before breakfast. 28 tablet 0   digoxin (LANOXIN) 0.125 MG tablet Take 1 tablet (0.125 mg total) by mouth daily. 30 tablet 3   ELIQUIS 5 MG TABS tablet Take 1 tablet (5 mg total) by mouth 2 (two) times daily. 180 tablet 3   escitalopram (LEXAPRO) 10 MG tablet TAKE 1 TABLET(10 MG) BY MOUTH DAILY 90 tablet 0   levOCARNitine (L-CARNITINE) 500 MG TABS Take 500 mg by mouth daily.     losartan (COZAAR) 25 MG tablet Take 0.5 tablets (12.5 mg total) by mouth at bedtime. 45 tablet 3   Magnesium 400 MG TABS Take 400 mg by mouth daily. 60 tablet 3   metFORMIN (GLUCOPHAGE) 500 MG tablet Take 1-3 tablets (500-1,500 mg total) by mouth See admin instructions. Take one tablet (500 mg) by mouth daily with breakfast and three tablets (1500 mg) daily with supper     metolazone (ZAROXOLYN) 2.5 MG tablet Take 1 tablet (2.5 mg total) by mouth every Friday. 12 tablet 3   milrinone (PRIMACOR) 20 MG/100 ML SOLN infusion Inject 0.0243 mg/min into the vein continuous.     ONETOUCH ULTRA test strip Use as instructed to check blood sugars twice daily 600 each 3   potassium chloride SA (KLOR-CON) 20 MEQ tablet Take 3 tablets (60 mEq total) by mouth 2 (two) times daily. Take 2 extra tablets on metolazone days     spironolactone (ALDACTONE) 25 MG tablet Take 1 tablet (25 mg total) by mouth daily. 90 tablet 3   torsemide (DEMADEX) 100 MG tablet Take 1 tablet (100 mg total) by mouth 2 (two)  times daily. 60 tablet 3   vitamin E 180 MG (400 UNITS) capsule Take 400 Units by mouth daily.     Zinc 50 MG TABS Take 50 mg by mouth daily.     Zn-Pyg Afri-Nettle-Saw Palmet (SAW PALMETTO COMPLEX PO) Take 540 mg by mouth daily.     No current facility-administered medications for this encounter.   Wt Readings from Last 3 Encounters:  02/18/21 103.1 kg (227 lb 6.4 oz)  02/03/21 99.8 kg (220 lb)  01/31/21 99 kg (218 lb 3.2 oz)   BP 110/68   Pulse 76   Wt 103.1 kg (227 lb 6.4 oz)   SpO2 92%   BMI 34.58 kg/m  General:  NAD. No resp difficulty HEENT: Normal, extremely HOH Neck: Supple. No JVD. Carotids 2+ bilat; no bruits. No lymphadenopathy or thryomegaly appreciated. Cor: PMI nondisplaced. Regular rate & rhythm. No rubs, gallops, II/VI SEM RUSB. Lungs: Clear Abdomen: Obese, nontender, nondistended. No hepatosplenomegaly. No bruits or masses. Good bowel sounds. Extremities: No cyanosis, clubbing, rash, edema Neuro: Alert & oriented x 3, cranial nerves grossly intact. Moves all 4 extremities w/o difficulty. Affect pleasant.  Assessment/Plan: 1. Chronic systolic CHF: Long-standing NICM, followed in the past in [place].  Has MDT ICD.  Echo 09/28/20 with EF <20%, severe LV dilation, mild RV dilation with moderate RV dysfunction, mild-moderate MR, moderate-severe TR, severe biatrial enlargement, no more than moderate AS.  Last cath in 2/22 showed nonobstructive mild CAD, elevated right and left heart filling pressures with low CI 2.04; AS no more than moderate.  Cause of cardiomyopathy uncertain, he is generally in NSR so unlikely to be tachy-mediated CMP.  Possible remote viral infection/viral myocarditis. NYHA class IV at 4/22 admission.  He was diuresed on milrinone, then milrinone stopped with drop in co-ox to 49%.  Milrinone restarted at 0.25, co-ox back up to 68% and discharged home on milrinone.  He is milrinone-dependent due to low output HF.  Long-term milrinone is not a great option  for him given recurrent VT/VF with ICD shocks.  NYHA class III-early IIIb on milrinone today, volume overloaded by exam & Optivol, weight up 7lbs. - Take metolazone 2.5 mg + 40 KCL tomorrow (9/10) and then twice a week, q Mondays and Fridays w/ extra 40 KCl. - Continue torsemide 100 mg bid. BMET today. - Continue spiro 25 mg daily. - Continue dig 0.125 mg. - Continue Farxiga 10 mg daily. - Continue losartan 12.5 mg daily. - No beta blocker with low output.  - He has low output HF. RV function on RHC appeared adequate with PAPi 4.1, creatinine stable.  We have been discussing LVAD and he has had full workup, he has met an LVAD patient and was able to get his questions about LVAD answered.  He wanted to go home with milrinone to give him a break from the hospital.  We have had discussions about the limitations of milrinone and about how LVAD is really the only good option at this point to reduce mortality.  - Long discussion with patient today and with his partner [name], by phone). He is clear that he wishes to stop milrinone and go back to [place] to get his home in order to sell. He understands the risks associated with stopping inotrope and removing PICC. We discussed having a plan in place with Hospice and Palliative care to manage his HF symptoms. We discussed deactivating his ICD as well. He is agreeable. Personally discussed plan with Dr. Aundra Dubin today. - Plan to wean milrinone to 0.125 then ultimately stop and discontinue PICC line in 1 week (this will be done at [facility] with [facility]). Will repeat BMET at that time. - Personally discussed with [facility] Carolynn Sayers today. They will be able to come out to [facility] home tomorrow to initiate wean. 2. Atrial fibrillation/flutter: Paroxysmal. Off Tikosyn after VF on 4/22. He is in SR today on ECG. - Continue amiodarone 200 mg daily. - Continue Eliquis. 3. Aortic stenosis: Based on full evaluation (cath and echo), suspect no more than moderate  aortic stenosis.  He would not benefit from TAVR at this point. 4. 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 No chest pain. - Continue statin.  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  Milrinone decreased to 0.25.  Tikosyn stopped and amiodarone started. No VT since 5/22.  Risk of recurrent VT/VF will be high as long as he is on milrinone. - He is on amiodarone.  6. Hyponatremia: History of hypervolemic hyponatremia. - Keep fluid restricted.  - BMET today. 7. Hx elevated LFTs: ? Amiodarone, ? CHF.  Doubt related to statin. Viral hepatitis labs negative.  Abdominal US with possible early cirrhosis, no gallbladder.  - Recent LFTs have normalized. 8. GOC: Long discussion about symptom management and what to expect when milrinone is discontinued (see above).  - He will require ICD de-activation. - Referral to [place].  Greater than 50% of the (total minutes 50) visit spent in counseling/coordination of care regarding ( discontinuing milrinone/PICC, referral for hospice and palliative care, and symptom management).  Followup in 2 months with Dr. Aundra Dubin.  [place] FNP 02/18/2021

## 2021-02-18 ENCOUNTER — Ambulatory Visit (HOSPITAL_BASED_OUTPATIENT_CLINIC_OR_DEPARTMENT_OTHER)
Admission: RE | Admit: 2021-02-18 | Discharge: 2021-02-18 | Disposition: A | Discharge status: Home / Self Care | Payer: Medicare Other | Source: Ambulatory Visit | Attending: Internal Medicine | Admitting: Internal Medicine

## 2021-02-18 ENCOUNTER — Encounter (HOSPITAL_COMMUNITY): Payer: Self-pay

## 2021-02-18 ENCOUNTER — Other Ambulatory Visit (HOSPITAL_COMMUNITY): Payer: Medicare Other

## 2021-02-18 ENCOUNTER — Other Ambulatory Visit: Payer: Self-pay

## 2021-02-18 ENCOUNTER — Ambulatory Visit (HOSPITAL_COMMUNITY)
Admission: RE | Admit: 2021-02-18 | Discharge: 2021-02-18 | Disposition: A | Discharge status: Home / Self Care | Payer: Medicare Other | Source: Ambulatory Visit | Attending: Internal Medicine | Admitting: Internal Medicine

## 2021-02-18 VITALS — BP 110/68 | HR 76 | Wt 227.4 lb

## 2021-02-18 DIAGNOSIS — Z95810 Presence of automatic (implantable) cardiac defibrillator: Secondary | ICD-10-CM | POA: Diagnosis not present

## 2021-02-18 DIAGNOSIS — I2510 Atherosclerotic heart disease of native coronary artery without angina pectoris: Secondary | ICD-10-CM

## 2021-02-18 DIAGNOSIS — I110 Hypertensive heart disease with heart failure: Secondary | ICD-10-CM | POA: Insufficient documentation

## 2021-02-18 DIAGNOSIS — Z7984 Long term (current) use of oral hypoglycemic drugs: Secondary | ICD-10-CM | POA: Diagnosis not present

## 2021-02-18 DIAGNOSIS — I480 Paroxysmal atrial fibrillation: Secondary | ICD-10-CM | POA: Diagnosis not present

## 2021-02-18 DIAGNOSIS — I083 Combined rheumatic disorders of mitral, aortic and tricuspid valves: Secondary | ICD-10-CM | POA: Insufficient documentation

## 2021-02-18 DIAGNOSIS — Z79899 Other long term (current) drug therapy: Secondary | ICD-10-CM | POA: Insufficient documentation

## 2021-02-18 DIAGNOSIS — I4892 Unspecified atrial flutter: Secondary | ICD-10-CM | POA: Insufficient documentation

## 2021-02-18 DIAGNOSIS — I350 Nonrheumatic aortic (valve) stenosis: Secondary | ICD-10-CM

## 2021-02-18 DIAGNOSIS — Z8249 Family history of ischemic heart disease and other diseases of the circulatory system: Secondary | ICD-10-CM | POA: Insufficient documentation

## 2021-02-18 DIAGNOSIS — I428 Other cardiomyopathies: Secondary | ICD-10-CM | POA: Diagnosis not present

## 2021-02-18 DIAGNOSIS — I5042 Chronic combined systolic (congestive) and diastolic (congestive) heart failure: Secondary | ICD-10-CM

## 2021-02-18 DIAGNOSIS — Z7189 Other specified counseling: Secondary | ICD-10-CM

## 2021-02-18 DIAGNOSIS — Z7901 Long term (current) use of anticoagulants: Secondary | ICD-10-CM | POA: Diagnosis not present

## 2021-02-18 DIAGNOSIS — I4729 Other ventricular tachycardia: Secondary | ICD-10-CM

## 2021-02-18 DIAGNOSIS — I472 Ventricular tachycardia: Secondary | ICD-10-CM

## 2021-02-18 DIAGNOSIS — I454 Nonspecific intraventricular block: Secondary | ICD-10-CM | POA: Diagnosis not present

## 2021-02-18 DIAGNOSIS — G4733 Obstructive sleep apnea (adult) (pediatric): Secondary | ICD-10-CM | POA: Insufficient documentation

## 2021-02-18 DIAGNOSIS — E871 Hypo-osmolality and hyponatremia: Secondary | ICD-10-CM | POA: Diagnosis not present

## 2021-02-18 DIAGNOSIS — E119 Type 2 diabetes mellitus without complications: Secondary | ICD-10-CM | POA: Insufficient documentation

## 2021-02-18 DIAGNOSIS — R7989 Other specified abnormal findings of blood chemistry: Secondary | ICD-10-CM

## 2021-02-18 DIAGNOSIS — Z7951 Long term (current) use of inhaled steroids: Secondary | ICD-10-CM | POA: Diagnosis not present

## 2021-02-18 LAB — ECHOCARDIOGRAM COMPLETE
AR max vel: 1.09 cm2
AV Area VTI: 0.95 cm2
AV Area mean vel: 0.94 cm2
AV Mean grad: 20 mmHg
AV Peak grad: 29.6 mmHg
Ao pk vel: 2.72 m/s
Area-P 1/2: 4.15 cm2
MV VTI: 1.38 cm2
S' Lateral: 5.3 cm

## 2021-02-18 LAB — BASIC METABOLIC PANEL
Anion gap: 14 (ref 5–15)
BUN: 19 mg/dL (ref 8–23)
CO2: 31 mmol/L (ref 22–32)
Calcium: 8.9 mg/dL (ref 8.9–10.3)
Chloride: 82 mmol/L — ABNORMAL LOW (ref 98–111)
Creatinine, Ser: 0.95 mg/dL (ref 0.61–1.24)
GFR, Estimated: >60 mL/min (ref >60)
Glucose, Bld: 162 mg/dL — ABNORMAL HIGH (ref 70–99)
Potassium: 3.2 mmol/L — ABNORMAL LOW (ref 3.5–5.1)
Sodium: 127 mmol/L — ABNORMAL LOW (ref 135–145)

## 2021-02-18 MED ORDER — MILRINONE LACTATE IN DEXTROSE 20-5 MG/100ML-% IV SOLN: 0.1250 ug/kg/min | INTRAVENOUS | Status: AC

## 2021-02-18 MED ORDER — METOLAZONE 2.5 MG PO TABS
2.5000 mg | ORAL_TABLET | Freq: Once | ORAL | Status: AC
  Administered 2021-02-18: 2.5 mg via ORAL

## 2021-02-18 NOTE — Progress Notes (Signed)
[facility] RN contacted by Dr. Teressa Lower via office phone to come to [facility] to remove PICC ASAP for end of life care. Informed him someone would respond as soon as possible.  Upon arrival to [facility], advised there was a change in plan and home health will be removing PICC for patient.

## 2021-02-18 NOTE — Patient Instructions (Addendum)
CONTINUE DECREASED dose of Amiodarone 200 mg, one tab daily  TAKE Metolazone 2.5mg , one tab 02/19/2021 with additional 40 meq of potassium. Be sure to resume Metolazone on Mondays and Fridays as scheduled   Milrinone will be decreased and stop in one week  Labs today We will only contact you if something comes back abnormal or we need to make some changes. Otherwise no news is good news!  You have been referred to [facility] -they will be in contact with an appointment   Your physician recommends that you schedule a follow-up appointment in: 2 weeks with Dr Shirlee Latch

## 2021-02-20 NOTE — Addendum Note (Signed)
Encounter addended by: Jacklynn Ganong, FNP on: 02/20/2021 4:13 PM  Actions taken: Clinical Note Signed, Result note filed

## 2021-02-21 ENCOUNTER — Encounter (HOSPITAL_COMMUNITY): Payer: Self-pay

## 2021-02-21 ENCOUNTER — Other Ambulatory Visit (HOSPITAL_COMMUNITY): Payer: Self-pay | Admitting: Cardiology

## 2021-02-21 ENCOUNTER — Telehealth: Payer: Self-pay

## 2021-02-21 ENCOUNTER — Other Ambulatory Visit (HOSPITAL_COMMUNITY): Payer: Self-pay | Admitting: *Deleted

## 2021-02-21 MED ORDER — METOLAZONE 2.5 MG PO TABS: ORAL_TABLET | ORAL | 3 refills | Status: DC

## 2021-02-21 NOTE — Telephone Encounter (Signed)
Okay for verbal orders? Please advise 

## 2021-02-21 NOTE — Telephone Encounter (Signed)
Chad Wilkerson from [facility] called stating the patient would like PCP to be attending PCP during hospice care. Called back # [phone]

## 2021-02-23 ENCOUNTER — Encounter (HOSPITAL_COMMUNITY): Payer: Self-pay

## 2021-02-23 ENCOUNTER — Other Ambulatory Visit (HOSPITAL_COMMUNITY): Payer: Self-pay | Admitting: Family Medicine

## 2021-02-23 NOTE — Telephone Encounter (Signed)
Left message to return phone call.

## 2021-02-24 ENCOUNTER — Telehealth (HOSPITAL_COMMUNITY): Payer: Self-pay | Admitting: Vascular Surgery

## 2021-02-24 NOTE — Telephone Encounter (Signed)
Hospice called stating they reached out to pt several times with no response / fu on referral

## 2021-02-24 NOTE — Telephone Encounter (Signed)
Left message to return phone call.

## 2021-02-24 NOTE — Telephone Encounter (Signed)
Reviewed patient's medication list for any interactions. I did not find any significant drug interactions that have not been appropriately accounted for with monitoring/labs. Last digoxin level was normal at 0.8 ng/mL, so digoxin/amiodarone interaction unlikely to be contributing. There are no interactions noted for any of his supplements.   Spoke with patient. He says his dizziness felt like his head was swimming and his eyes were fuzzy. His BP has remained normal for him, ~115/60s. He is not dizzy today and is feeling better. Informed him that I did not find any significant drug interactions. Patient was very grateful and informed me he will let us know if the dizziness returns.

## 2021-02-25 NOTE — Telephone Encounter (Signed)
Verbal orders given to [facility] from [facility]

## 2021-02-27 ENCOUNTER — Encounter (HOSPITAL_COMMUNITY): Payer: Self-pay

## 2021-02-28 ENCOUNTER — Encounter (HOSPITAL_COMMUNITY): Payer: Self-pay

## 2021-02-28 ENCOUNTER — Encounter: Payer: Self-pay | Admitting: Adult Health

## 2021-03-01 ENCOUNTER — Encounter: Payer: Medicare Other | Admitting: Cardiology

## 2021-03-01 NOTE — Progress Notes (Deleted)
Electrophysiology Office Follow up Visit Note:    Date:  03/01/2021   ID:  Chad Wilkerson, DOB April 30, 1946, MRN [id number]  PCP:  [name], NP  [facility] Cardiologist:  Christell Constant, MD  [facility] Electrophysiologist:  Lanier Prude, MD    Interval History:    Chad Wilkerson is a 75 y.o. male who presents for a follow up visit. They were last seen in clinic August 17, 2020.  He is on dofetilide for his persistent atrial fibrillation.  At the time of our last appointment he was doing well.   Past Medical History:  Diagnosis Date   Abnormal liver function tests    Adjustment disorder    Aortic stenosis    Benign colon polyp    Chronic systolic CHF (congestive heart failure) (HCC)    Diabetes mellitus without complication (HCC)    Diverticulitis    Essential hypertension    Heart disease    Hypogonadism male    Hyponatremia    ICD (implantable cardioverter-defibrillator) in place    Mitral regurgitation    NICM (nonischemic cardiomyopathy) (HCC)    PAF (paroxysmal atrial fibrillation) (HCC)    Sleep apnea    Tricuspid regurgitation    Urine incontinence    Ventricular tachycardia (HCC)    Vitamin D deficiency     Past Surgical History:  Procedure Laterality Date   CARDIOVERSION N/A 02/03/2021   Procedure: CARDIOVERSION;  Surgeon: [name], MD;  Location: [facility] ENDOSCOPY;  Service: Cardiovascular;  Laterality: N/A;   CHOLECYSTECTOMY     RIGHT HEART CATH N/A 10/05/2020   Procedure: RIGHT HEART CATH;  Surgeon: [name], MD;  Location: [facility] INVASIVE CV LAB;  Service: Cardiovascular;  Laterality: N/A;   RIGHT/LEFT HEART CATH AND CORONARY ANGIOGRAPHY N/A 07/19/2020   Procedure: RIGHT/LEFT HEART CATH AND CORONARY ANGIOGRAPHY;  Surgeon: Kathleene Hazel, MD;  Location: [facility] INVASIVE CV LAB;  Service: Cardiovascular;  Laterality: N/A;   TONSILLECTOMY      Current Medications: No outpatient medications have been marked as taking for  the 03/01/21 encounter (Appointment) with Lanier Prude, MD.     Allergies:   Semaglutide   Social History   Socioeconomic History   Marital status: Widowed    Spouse name: Not on file   Number of children: Not on file   Years of education: Not on file   Highest education level: Not on file  Occupational History   Not on file  Tobacco Use   Smoking status: Former   Smokeless tobacco: Never  Vaping Use   Vaping Use: Never used  Substance and Sexual Activity   Alcohol use: Not Currently   Drug use: Not Currently   Sexual activity: Not on file  Other Topics Concern   Not on file  Social History Narrative   Not on file   Social Determinants of Health   Financial Resource Strain: Low Risk    Difficulty of Paying Living Expenses: Not very hard  Food Insecurity: No Food Insecurity   Worried About Running Out of Food in the Last Year: Never true   Ran Out of Food in the Last Year: Never true  Transportation Needs: No Transportation Needs   Lack of Transportation (Medical): No   Lack of Transportation (Non-Medical): No  Physical Activity: Inactive   Days of Exercise per Week: 0 days   Minutes of Exercise per Session: 0 min  Stress: No Stress Concern Present   Feeling of Stress :  Not at all  Social Connections: Moderately Isolated   Frequency of Communication with Friends and Family: Once a week   Frequency of Social Gatherings with Friends and Family: More than three times a week   Attends Religious Services: Never   Database administrator or Organizations: No   Attends Engineer, structural: Never   Marital Status: Living with partner     Family History: The patient's family history includes Asthma in his father; Heart attack in his mother and paternal grandfather; High Cholesterol in his mother; Kidney disease in his sister; Stroke in his maternal grandfather.  ROS:   Please see the history of present illness.    All other systems reviewed and are  negative.  EKGs/Labs/Other Studies Reviewed:    The following studies were reviewed today: ***  EKG:  The ekg ordered today demonstrates ***  Recent Labs: 04/01/2020: NT-Pro BNP 5,001 09/28/2020: B Natriuretic Peptide 3,328.4 01/26/2021: Magnesium 2.4 01/31/2021: ALT 27; Hemoglobin 14.1; Platelets 317; TSH 2.573 02/18/2021: BUN 19; Creatinine, Ser 0.95; Potassium 3.2; Sodium 127  Recent Lipid Panel    Component Value Date/Time   CHOL 87 10/07/2020 0500   TRIG 68 10/07/2020 0500   HDL 31 (L) 10/07/2020 0500   CHOLHDL 2.8 10/07/2020 0500   VLDL 14 10/07/2020 0500   LDLCALC 42 10/07/2020 0500    Physical Exam:    VS:  There were no vitals taken for this visit.    Wt Readings from Last 3 Encounters:  02/18/21 227 lb 6.4 oz (103.1 kg)  02/03/21 220 lb (99.8 kg)  01/31/21 218 lb 3.2 oz (99 kg)     GEN: *** Well nourished, well developed in no acute distress HEENT: Normal NECK: No JVD; No carotid bruits LYMPHATICS: No lymphadenopathy CARDIAC: ***RRR, no murmurs, rubs, gallops RESPIRATORY:  Clear to auscultation without rales, wheezing or rhonchi  ABDOMEN: Soft, non-tender, non-distended MUSCULOSKELETAL:  No edema; No deformity  SKIN: Warm and dry NEUROLOGIC:  Alert and oriented x 3 PSYCHIATRIC:  Normal affect   ASSESSMENT:    1. Atrial fibrillation, unspecified type (HCC)   2. Atrial flutter, unspecified type (HCC)   3. Chronic combined systolic and diastolic CHF (congestive heart failure) (HCC)   4. ICD (implantable cardioverter-defibrillator) in place    PLAN:    In order of problems listed above:           Total time spent with patient today *** minutes. This includes reviewing records, evaluating the patient and coordinating care.   Medication Adjustments/Labs and Tests Ordered: Current medicines are reviewed at length with the patient today.  Concerns regarding medicines are outlined above.  No orders of the defined types were placed in this  encounter.  No orders of the defined types were placed in this encounter.    Signed, Steffanie Dunn, MD, [facility], [facility] 03/01/2021 8:02 AM    Electrophysiology [facility]

## 2021-03-03 ENCOUNTER — Telehealth (HOSPITAL_COMMUNITY): Payer: Self-pay | Admitting: *Deleted

## 2021-03-03 NOTE — Telephone Encounter (Signed)
-----   Message from [name], MD sent at 02/28/2021 11:57 AM EDT ----- Please arrange for a remote transmission from his Medtronic device (or an ECG check if he is in town) to see if he is in atrial fibrillation.

## 2021-03-03 NOTE — Telephone Encounter (Signed)
Pt advised via mychart to transmit device.

## 2021-03-09 ENCOUNTER — Encounter (HOSPITAL_COMMUNITY): Payer: Medicare Other | Admitting: Cardiology

## 2021-03-14 ENCOUNTER — Telehealth: Payer: Self-pay

## 2021-03-14 NOTE — Telephone Encounter (Signed)
Left detailed message for Pt advising all cardiology follow up appointments scheduled on same day per Pt request.  Left message to call back if needs to reschedule.

## 2021-03-15 ENCOUNTER — Other Ambulatory Visit (HOSPITAL_COMMUNITY): Payer: Self-pay | Admitting: Cardiology

## 2021-03-17 ENCOUNTER — Other Ambulatory Visit (HOSPITAL_COMMUNITY): Payer: Self-pay | Admitting: Cardiology

## 2021-03-28 ENCOUNTER — Encounter (HOSPITAL_COMMUNITY): Payer: Self-pay

## 2021-04-06 ENCOUNTER — Other Ambulatory Visit (HOSPITAL_COMMUNITY): Payer: Self-pay | Admitting: Cardiology

## 2021-04-08 ENCOUNTER — Encounter (HOSPITAL_COMMUNITY): Payer: Self-pay

## 2021-04-10 NOTE — Progress Notes (Addendum)
Cardiology Office Note Date:  04/13/2021  Patient ID:  Chad Wilkerson, DOB [birth year], MRN [id number] PCP:  Dorothyann Peng, NP  Cardiologist:  Dr. Gasper Sells AHF: Dr. Aundra Dubin Electrophysiologist: Dr. Quentin Ore    Chief Complaint:  6 mo  History of Present Illness: Chad Wilkerson is a 75 y.o. male with history of NICM, chronic CHF (combined), AFib, HTN, DM, ICD, OSA w/BIPAP, VT, VHD w/mod AS.  He comes in today to be seen for dr. Quentin Ore, last seen by him in clinic March 2022, though has had a few hospitalizations since then.  April 2022 hospitalization with CHF exacerbation/shock was discharged on milrinone.  He has struggled with volume, discussion on possible LVAD, home inotrope with HF team His last clinic visit with them 02/18/21 planned to wean off milrinone, planned referral to Hospice/palliative care and HV therapies to be deactivated >>> He did well off inotrope and hospice NOT pursued, device therapies remain on   He was hospitalized at [facility] in [facility] 03/13/21 via an involuntary confinement 2/2 escalating/erratic behavior, paranoia.  Psych was involved as well as medicine. Ultimately cleared to go home by psych Cardiology was as well on his case, felt to have been stable considering his marked EF reduction, LFLG AS, it does not appear there was any particular acute cardiac issue during his stay Looks like he was discharged 03/25/21  TODAY He has seen Dr. Aundra Dubin and Dr. Gasper Sells today already. He is accompanied by a lifelong fried, the patient hard of hearing, she helps fill in the gaps, he seems to hear her well.  He has no device concerns, mentions that Dr. Aundra Dubin mentioned he was going to talk with [place] or Dr. Quentin Ore about possibly adding a wire to his deice. He is hungry but otherwise has no complaints. Mentions his goal is to live another 5 years and hopes this or an upgraded device will help with that,  No reports of syncope or shocks He said he had an  EKG and labs at [facility] clinic   Device information MDT single chamber ICD implanted 12/04/2016  +VT/VF w/ATP (during a hospitalization while on milrinone April 2022   AAD Amiodarone remotely and stopped for unclear malaise only after a few days of tx Tikosyn >>> stopped April 2022 Amiodarone started April 2022 >>> current   Past Medical History:  Diagnosis Date   Abnormal liver function tests    Adjustment disorder    Aortic stenosis    Benign colon polyp    Chronic systolic CHF (congestive heart failure) ([place])    Diabetes mellitus without complication ([place])    Diverticulitis    Essential hypertension    Heart disease    Hypogonadism male    Hyponatremia    ICD (implantable cardioverter-defibrillator) in place    Mitral regurgitation    NICM (nonischemic cardiomyopathy) ([place])    PAF (paroxysmal atrial fibrillation) ([place])    Sleep apnea    Tricuspid regurgitation    Urine incontinence    Ventricular tachycardia    Vitamin D deficiency     Past Surgical History:  Procedure Laterality Date   CARDIOVERSION N/A 02/03/2021   Procedure: CARDIOVERSION;  Surgeon: Larey Dresser, MD;  Location: [place];  Service: Cardiovascular;  Laterality: N/A;   CHOLECYSTECTOMY     RIGHT HEART CATH N/A 10/05/2020   Procedure: RIGHT HEART CATH;  Surgeon: Larey Dresser, MD;  Location: [place] CV LAB;  Service: Cardiovascular;  Laterality: N/A;   RIGHT/LEFT HEART CATH AND CORONARY  ANGIOGRAPHY N/A 07/19/2020   Procedure: RIGHT/LEFT HEART CATH AND CORONARY ANGIOGRAPHY;  Surgeon: Burnell Blanks, MD;  Location: [place] CV LAB;  Service: Cardiovascular;  Laterality: N/A;   TONSILLECTOMY      Current Outpatient Medications  Medication Sig Dispense Refill   acetaminophen (TYLENOL) 500 MG tablet Take 1,000 mg by mouth every 6 (six) hours as needed for headache or mild pain.     amiodarone (PACERONE) 200 MG tablet Take 200 mg by mouth daily.     Arginine (L-ARGININE-500) 500 MG  CAPS Take 500 mg by mouth daily.     Ascorbic Acid (VITAMIN C) 1000 MG tablet Take 1,000 mg by mouth 2 (two) times daily.     atorvastatin (LIPITOR) 20 MG tablet TAKE 1 TABLET BY MOUTH EVERY DAY 90 tablet 3   B Complex Vitamins (B COMPLEX PO) Take 1 tablet by mouth daily.     Biotin 10 MG TABS Take 10 mg by mouth daily. 10 mg - 10,000 mcg     budesonide-formoterol (SYMBICORT) 160-4.5 MCG/ACT inhaler Inhale 2 puffs into the lungs 2 (two) times daily. 30.6 g 3   calcium carbonate (OS-CAL - DOSED IN MG OF ELEMENTAL CALCIUM) 1250 (500 Ca) MG tablet Take by mouth daily at 6 (six) AM.     cholecalciferol (VITAMIN D3) 25 MCG (1000 UNIT) tablet Take 1,000 Units by mouth 2 (two) times daily.     CHROMIUM PO Take 1 tablet by mouth 2 (two) times daily.     dapagliflozin propanediol (FARXIGA) 10 MG TABS tablet Take 1 tablet (10 mg total) by mouth daily before breakfast. 28 tablet 0   digoxin (LANOXIN) 0.125 MG tablet TAKE 1 TABLET BY MOUTH EVERY DAY 30 tablet 3   ELIQUIS 5 MG TABS tablet Take 1 tablet (5 mg total) by mouth 2 (two) times daily. 180 tablet 3   escitalopram (LEXAPRO) 10 MG tablet TAKE 1 TABLET(10 MG) BY MOUTH DAILY 90 tablet 0   levOCARNitine (L-CARNITINE) 500 MG TABS Take 500 mg by mouth daily.     losartan (COZAAR) 25 MG tablet Take 1 tablet (25 mg total) by mouth at bedtime. 90 tablet 3   Magnesium 400 MG TABS Take 400 mg by mouth daily. 60 tablet 3   melatonin 3 MG TABS tablet Take by mouth as needed (insomnia).     metFORMIN (GLUCOPHAGE) 500 MG tablet Take 1-3 tablets (500-1,500 mg total) by mouth See admin instructions. Take one tablet (500 mg) by mouth daily with breakfast and three tablets (1500 mg) daily with supper     metolazone (ZAROXOLYN) 2.5 MG tablet Take 1 tablet by mouth every Monday and Friday. 12 tablet 3   potassium chloride SA (KLOR-CON) 20 MEQ tablet Take 3 tablets (60 mEq total) by mouth 2 (two) times daily. Take 2 extra tablets on metolazone days     spironolactone  (ALDACTONE) 25 MG tablet Take 1 tablet (25 mg total) by mouth daily. 90 tablet 3   torsemide (DEMADEX) 100 MG tablet Take 1 tablet (100 mg total) by mouth 2 (two) times daily. 60 tablet 3   vitamin E 180 MG (400 UNITS) capsule Take 400 Units by mouth daily.     Zinc 50 MG TABS Take 50 mg by mouth daily.     Zn-Pyg Afri-Nettle-Saw Palmet (SAW PALMETTO COMPLEX PO) Take 540 mg by mouth daily.     Blood Glucose Monitoring Suppl (ONE TOUCH ULTRA 2) w/Device KIT Use to monitor blood sugar 1 kit 0  ONETOUCH ULTRA test strip Use as instructed to check blood sugars twice daily 600 each 3   No current facility-administered medications for this visit.    Allergies:   Semaglutide   Social History:  The patient  reports that he has quit smoking. He has never used smokeless tobacco. He reports that he does not currently use alcohol. He reports that he does not currently use drugs.   Family History:  The patient's family history includes Asthma in his father; Heart attack in his mother and paternal grandfather; High Cholesterol in his mother; Kidney disease in his sister; Stroke in his maternal grandfather.  ROS:  Please see the history of present illness.    All other systems are reviewed and otherwise negative.   PHYSICAL EXAM:  VS:  BP 98/60   Pulse (!) 55   Ht _0  (1.727 m)   Wt 218 lb (98.9 kg)   BMI 33.15 kg/m  BMI: Body mass index is 33.15 kg/m. Well nourished, well developed, in no acute distress Deferred, he has seen AHF MD and cardiologist today   ICD site is stable, no tethering or discomfort   EKG:  not done today  Device interrogation done today and reviewed by myself:  Battery and lead measurements are good AF burden 2.4%  Most of this looks like occurred a couple months ago   ECHO (03/19/21, [facility]): 1. Moderately dilated left ventricle. Severely depressed left ventricular  systolic function. LV Ejection Fraction is approximately: 15 %. 2. Left ventricular diastolic  parameters are consistent with severe (Grade  III) diastolic dysfunction (restrictive physiology). Abnormal diastolic  function with elevated left ventricular filling pressures. 3. Moderately dilated right ventricle. Mild right ventricular hypokinesis. 4. Severely dilated left atrium. 5. Moderate mitral valve regurgitation. 6. Low-flow/low-gradient aortic stenosis with reduced LVEF. 7. Estimated pulmonary artery systolic pressure is consistent with moderate  pulmonary hypertension (45-24mHg). 8. located posteriorly. 9. The aortic root is normal in size. The ascending aorta is normal in size.   Recent Labs: 09/28/2020: B Natriuretic Peptide 3,328.4 01/26/2021: Magnesium 2.4 01/31/2021: ALT 27; TSH 2.573 04/13/2021: BUN 46; Creatinine, Ser 1.15; Hemoglobin 14.3; Platelets 372; Potassium 3.1; Sodium 126  10/07/2020: Cholesterol 87; HDL 31; LDL Cholesterol 42; Total CHOL/HDL Ratio 2.8; Triglycerides 68; VLDL 14   Estimated Creatinine Clearance: 63.3 mL/min (by C-G formula based on SCr of 1.15 mg/dL).   Wt Readings from Last 3 Encounters:  04/13/21 218 lb (98.9 kg)  04/13/21 218 lb (98.9 kg)  04/13/21 219 lb 9.6 oz (99.6 kg)     Other studies reviewed: Additional studies/records reviewed today include: summarized above  ASSESSMENT AND PLAN:  ICD Intact function No programming changes were made  VT Amiodarone none  NICM Chronic CHF He saw AHF and gen cards today Deferred to them OptiVol is up, sent to Dr. MAundra DubinHe denies SOB  I was able to touch base with Dr. MAundra Dubinafter the patient left today he has already reached out to Dr. LQuentin Oreto discus device upgrade thoughts/recommendations I defer to them   Paroxysmal Afib CHA2DS2Vasc is 5, on eliquis low burden on amiodarone and Eliquis    Disposition: F/u with remotes and in clinic in 1 year, sooner pending Dr. LQuentin Oreand Der. McLean's discussion/decisions   04/21/21: ADDEND: Dr. LQuentin Oregot back to Dr. MAundra Dubinand  myself as follows: ECG is not a classic LBBB and his echo shows thinned LV without much dyssynchrony.  I do not think he has a good chance for any  recovery with biventricular pacing. I would recommend continued medical therapy.  I will ask my MA to let the patient know at this time, no plans for device upgrade.   Current medicines are reviewed at length with the patient today.  The patient did not have any concerns regarding medicines.  Venetia Night, PA-C 04/13/2021 11:53 AM     [facility] [address] [place] [id number] [phone] (office)  [phone] (fax)

## 2021-04-12 NOTE — Addendum Note (Signed)
Encounter addended by: Jaiden Dinkins H on: 04/12/2021 11:21 AM  Actions taken: Imaging Exam ended

## 2021-04-13 ENCOUNTER — Encounter (HOSPITAL_COMMUNITY): Payer: Self-pay | Admitting: Cardiology

## 2021-04-13 ENCOUNTER — Ambulatory Visit (INDEPENDENT_AMBULATORY_CARE_PROVIDER_SITE_OTHER): Payer: Medicare Other | Admitting: Internal Medicine

## 2021-04-13 ENCOUNTER — Ambulatory Visit (INDEPENDENT_AMBULATORY_CARE_PROVIDER_SITE_OTHER): Payer: Medicare Other | Admitting: Physician Assistant

## 2021-04-13 ENCOUNTER — Encounter: Payer: Self-pay | Admitting: Physician Assistant

## 2021-04-13 ENCOUNTER — Other Ambulatory Visit: Payer: Self-pay

## 2021-04-13 ENCOUNTER — Ambulatory Visit (HOSPITAL_COMMUNITY)
Admission: RE | Admit: 2021-04-13 | Discharge: 2021-04-13 | Disposition: A | Discharge status: Home / Self Care | Payer: Medicare Other | Source: Ambulatory Visit | Attending: Cardiology | Admitting: Cardiology

## 2021-04-13 ENCOUNTER — Encounter: Payer: Self-pay | Admitting: Internal Medicine

## 2021-04-13 VITALS — BP 98/60 | HR 55 | Ht 68.0 in | Wt 218.0 lb

## 2021-04-13 VITALS — BP 114/78 | HR 69 | Wt 219.6 lb

## 2021-04-13 DIAGNOSIS — Z95810 Presence of automatic (implantable) cardiac defibrillator: Secondary | ICD-10-CM | POA: Insufficient documentation

## 2021-04-13 DIAGNOSIS — Z7984 Long term (current) use of oral hypoglycemic drugs: Secondary | ICD-10-CM | POA: Insufficient documentation

## 2021-04-13 DIAGNOSIS — I472 Ventricular tachycardia, unspecified: Secondary | ICD-10-CM | POA: Diagnosis not present

## 2021-04-13 DIAGNOSIS — Z7901 Long term (current) use of anticoagulants: Secondary | ICD-10-CM | POA: Insufficient documentation

## 2021-04-13 DIAGNOSIS — Z8249 Family history of ischemic heart disease and other diseases of the circulatory system: Secondary | ICD-10-CM | POA: Diagnosis not present

## 2021-04-13 DIAGNOSIS — I110 Hypertensive heart disease with heart failure: Secondary | ICD-10-CM | POA: Insufficient documentation

## 2021-04-13 DIAGNOSIS — E871 Hypo-osmolality and hyponatremia: Secondary | ICD-10-CM | POA: Insufficient documentation

## 2021-04-13 DIAGNOSIS — I350 Nonrheumatic aortic (valve) stenosis: Secondary | ICD-10-CM

## 2021-04-13 DIAGNOSIS — I480 Paroxysmal atrial fibrillation: Secondary | ICD-10-CM | POA: Diagnosis not present

## 2021-04-13 DIAGNOSIS — Z79899 Other long term (current) drug therapy: Secondary | ICD-10-CM | POA: Insufficient documentation

## 2021-04-13 DIAGNOSIS — I5022 Chronic systolic (congestive) heart failure: Secondary | ICD-10-CM | POA: Diagnosis not present

## 2021-04-13 DIAGNOSIS — I5023 Acute on chronic systolic (congestive) heart failure: Secondary | ICD-10-CM | POA: Diagnosis not present

## 2021-04-13 DIAGNOSIS — I5042 Chronic combined systolic (congestive) and diastolic (congestive) heart failure: Secondary | ICD-10-CM

## 2021-04-13 DIAGNOSIS — R7989 Other specified abnormal findings of blood chemistry: Secondary | ICD-10-CM | POA: Insufficient documentation

## 2021-04-13 DIAGNOSIS — I080 Rheumatic disorders of both mitral and aortic valves: Secondary | ICD-10-CM | POA: Insufficient documentation

## 2021-04-13 DIAGNOSIS — R935 Abnormal findings on diagnostic imaging of other abdominal regions, including retroperitoneum: Secondary | ICD-10-CM | POA: Insufficient documentation

## 2021-04-13 DIAGNOSIS — I428 Other cardiomyopathies: Secondary | ICD-10-CM

## 2021-04-13 DIAGNOSIS — Z7951 Long term (current) use of inhaled steroids: Secondary | ICD-10-CM | POA: Diagnosis not present

## 2021-04-13 DIAGNOSIS — E119 Type 2 diabetes mellitus without complications: Secondary | ICD-10-CM | POA: Insufficient documentation

## 2021-04-13 DIAGNOSIS — I2510 Atherosclerotic heart disease of native coronary artery without angina pectoris: Secondary | ICD-10-CM | POA: Insufficient documentation

## 2021-04-13 DIAGNOSIS — G4733 Obstructive sleep apnea (adult) (pediatric): Secondary | ICD-10-CM

## 2021-04-13 LAB — BASIC METABOLIC PANEL
Anion gap: 14 (ref 5–15)
BUN: 46 mg/dL — ABNORMAL HIGH (ref 8–23)
CO2: 32 mmol/L (ref 22–32)
Calcium: 9.8 mg/dL (ref 8.9–10.3)
Chloride: 80 mmol/L — ABNORMAL LOW (ref 98–111)
Creatinine, Ser: 1.15 mg/dL (ref 0.61–1.24)
GFR, Estimated: >60 mL/min (ref >60)
Glucose, Bld: 186 mg/dL — ABNORMAL HIGH (ref 70–99)
Potassium: 3.1 mmol/L — ABNORMAL LOW (ref 3.5–5.1)
Sodium: 126 mmol/L — ABNORMAL LOW (ref 135–145)

## 2021-04-13 LAB — CUP PACEART INCLINIC DEVICE CHECK
Battery Remaining Longevity: 82 mo
Battery Voltage: 2.98 V
Brady Statistic RV Percent Paced: 0.2 %
Date Time Interrogation Session: 20221102122814
HighPow Impedance: 98 Ohm
Implantable Lead Implant Date: 20180625
Implantable Lead Location: 753860
Implantable Pulse Generator Implant Date: 20180625
Lead Channel Impedance Value: 342 Ohm
Lead Channel Impedance Value: 418 Ohm
Lead Channel Pacing Threshold Amplitude: 0.75 V
Lead Channel Pacing Threshold Pulse Width: 0.4 ms
Lead Channel Sensing Intrinsic Amplitude: 16.75 mV
Lead Channel Sensing Intrinsic Amplitude: 21 mV
Lead Channel Setting Pacing Amplitude: 2 V
Lead Channel Setting Pacing Pulse Width: 0.4 ms
Lead Channel Setting Sensing Sensitivity: 0.3 mV

## 2021-04-13 LAB — CBC
HCT: 41.5 % (ref 39.0–52.0)
Hemoglobin: 14.3 g/dL (ref 13.0–17.0)
MCH: 30 pg (ref 26.0–34.0)
MCHC: 34.5 g/dL (ref 30.0–36.0)
MCV: 87 fL (ref 80.0–100.0)
Platelets: 372 10*3/uL (ref 150–400)
RBC: 4.77 MIL/uL (ref 4.22–5.81)
RDW: 14.6 % (ref 11.5–15.5)
WBC: 10.4 10*3/uL (ref 4.0–10.5)
nRBC: 0 % (ref 0.0–0.2)

## 2021-04-13 LAB — DIGOXIN LEVEL: Digoxin Level: 1 ng/mL (ref 0.8–2.0)

## 2021-04-13 MED ORDER — LOSARTAN POTASSIUM 25 MG PO TABS: 25.0000 mg | ORAL_TABLET | Freq: Every day | ORAL | 3 refills | Status: AC

## 2021-04-13 NOTE — Progress Notes (Signed)
PCP: Dorothyann Peng, NP Cardiology: Dr Gasper Sells HF Cardiology: Dr. Aundra Dubin  75 y.o. with history of chronic systolic CHF, paroxysmal atrial fibrillation, and type 2 diabetes presents for followup of CHF.  Patient has a long history of nonischemic cardiomyopathy, has Medtronic ICD.  Cath in 2/22 showed mild nonobstructive CAD.  Echo in 4/22 showed EF < 20%, severe LV dilation, moderately decreased RV systolic function with mild RV enlargement, severe biatrial enlargement, mild-moderate MR, no more than moderate AS. Recent workup has suggested no more than moderate aortic stenosis, not thought to be severe enough for TAVR.   Patient was admitted in 4/22 with acute on chronic systolic CHF and marked volume overload.  Co-ox was low and milrinone 0.25 was started.  He was extensively diuresed.  He had sustained VT terminated by ICD discharge while in the hospital.  Tikosyn was stopped and amiodarone was started. In the past, he had not tolerated amiodarone but has had no problem with it this time.  I tried to wean him off milrinone, but co-ox dropped to 49% so I restarted it, and he went home on milrinone.  We discussed LVAD while he was in the hospital and workup was completed.  He wanted to go home for some time to think about it.    Admitted 8/22 with severe hypokalemia, hyponatermia and AKI after taking 500 mg torsemide bid for about a week by accident. He was treated with IV fluids and potassium repleted. AHF was consulted to assist with management. He required IV lasix for a/c CHF later in hospitalization and was discharged home on previous home dose diuretics.  In 9/22, he decided that he was not interested in LVAD placement and wanted to stop milrinone.  We weaned him off milrinone.  Echo in 9/22 showed EF 25-30%, normal RV, moderate aortic stenosis.  Patient moved to [facility] and has been living there predominantly. In 10/22, he had an involuntary commitment to [facility] for a  psychotic event and depression.    He returns today for followup of CHF.  Weight is down about 8 lbs.  He actually seems to be doing fairly well.  Taking all meds as ordered.  No dyspnea walking on flat ground. No chest pain.  No orthopnea/PND.  Short of breath with heavier exertion and tires easily.  He plans to join a gym in [place].    Labs (5/22): K 3.6, Na 132, creatinine 1.05 => 1.11 => 0.97, AST 49, ALT 53, digoxin 0.9 Labs (8/22): K 3.6 => 3.4, creatinine 1.11 => 1.03 Labs (10/22): K 4.1, creatinine 1.16  Medtronic device interrogation (personally reviewed): Occasional transient atrial fibrillation (hours).  Fluid index > threshold but impedance has been trending up.    ECG (personally reviewed): NSR with 1st degree AVB, IVCD 154 msec (LBBB-like).   PMH:  1. HTN 2. Type 2 diabetes 3. Atrial fibrillation/flutter: Paroxysmal.  Initially on dofetilide, now on amiodarone.  4. OSA: uses Bipap 5. VT: In 4/22 while on milrinone, now on amiodarone.  Recurrent episode in 5/22.  6. Chronic systolic CHF: Nonischemic cardiomyopathy. Medtronic ICD.  - LHC/RHC (2/22): Mild nonobstructive CAD, CI low at 2.04. Moderate aortic stenosis.  - Echo (4/22): EF < 20%, severe LV dilation, moderately decreased RV systolic function with mild RV enlargement, severe biatrial enlargement, mild-moderate MR, no more than moderate AS.  - RHC (on milrinone): mean RA 10, PA 64/23, mean PCWP 23, CI 2.89, PAPi 4.1.  - Co-ox 49% off milrinone.  - Echo (9/22):  EF 25-30%, normal RV, moderate aortic stenosis.  7. Aortic stenosis: Moderate by echo and cath.  8. Elevated LFTs 9. Depression with history of psychosis.   Social History   Socioeconomic History   Marital status: Widowed    Spouse name: Not on file   Number of children: Not on file   Years of education: Not on file   Highest education level: Not on file  Occupational History   Not on file  Tobacco Use   Smoking status: Former   Smokeless tobacco:  Never  Vaping Use   Vaping Use: Never used  Substance and Sexual Activity   Alcohol use: Not Currently   Drug use: Not Currently   Sexual activity: Not on file  Other Topics Concern   Not on file  Social History Narrative   Not on file   Social Determinants of Health   Financial Resource Strain: Low Risk    Difficulty of Paying Living Expenses: Not very hard  Food Insecurity: No Food Insecurity   Worried About Running Out of Food in the Last Year: Never true   Ran Out of Food in the Last Year: Never true  Transportation Needs: No Transportation Needs   Lack of Transportation (Medical): No   Lack of Transportation (Non-Medical): No  Physical Activity: Inactive   Days of Exercise per Week: 0 days   Minutes of Exercise per Session: 0 min  Stress: No Stress Concern Present   Feeling of Stress : Not at all  Social Connections: Moderately Isolated   Frequency of Communication with Friends and Family: Once a week   Frequency of Social Gatherings with Friends and Family: More than three times a week   Attends Religious Services: Never   Marine scientist or Organizations: No   Attends Music therapist: Never   Marital Status: Living with partner  Intimate Partner Violence: Not At Risk   Fear of Current or Ex-Partner: No   Emotionally Abused: No   Physically Abused: No   Sexually Abused: No   Family History  Problem Relation Age of Onset   Heart attack Mother    High Cholesterol Mother    Asthma Father    Kidney disease Sister    Stroke Maternal Grandfather    Heart attack Paternal Grandfather    ROS: All systems reviewed and negative except as per HPI.   Current Outpatient Medications  Medication Sig Dispense Refill   acetaminophen (TYLENOL) 500 MG tablet Take 1,000 mg by mouth every 6 (six) hours as needed for headache or mild pain.     amiodarone (PACERONE) 200 MG tablet Take 200 mg by mouth daily.     Arginine (L-ARGININE-500) 500 MG CAPS Take 500  mg by mouth daily.     Ascorbic Acid (VITAMIN C) 1000 MG tablet Take 1,000 mg by mouth 2 (two) times daily.     atorvastatin (LIPITOR) 20 MG tablet TAKE 1 TABLET BY MOUTH EVERY DAY 90 tablet 3   B Complex Vitamins (B COMPLEX PO) Take 1 tablet by mouth daily.     Biotin 10 MG TABS Take 10 mg by mouth daily. 10 mg - 10,000 mcg     Blood Glucose Monitoring Suppl (ONE TOUCH ULTRA 2) w/Device KIT Use to monitor blood sugar 1 kit 0   budesonide-formoterol (SYMBICORT) 160-4.5 MCG/ACT inhaler Inhale 2 puffs into the lungs 2 (two) times daily. 30.6 g 3   cholecalciferol (VITAMIN D3) 25 MCG (1000 UNIT) tablet Take 1,000 Units by  mouth 2 (two) times daily.     CHROMIUM PO Take 1 tablet by mouth 2 (two) times daily.     dapagliflozin propanediol (FARXIGA) 10 MG TABS tablet Take 1 tablet (10 mg total) by mouth daily before breakfast. 28 tablet 0   digoxin (LANOXIN) 0.125 MG tablet TAKE 1 TABLET BY MOUTH EVERY DAY 30 tablet 3   ELIQUIS 5 MG TABS tablet Take 1 tablet (5 mg total) by mouth 2 (two) times daily. 180 tablet 3   escitalopram (LEXAPRO) 10 MG tablet TAKE 1 TABLET(10 MG) BY MOUTH DAILY 90 tablet 0   levOCARNitine (L-CARNITINE) 500 MG TABS Take 500 mg by mouth daily.     Magnesium 400 MG TABS Take 400 mg by mouth daily. 60 tablet 3   metFORMIN (GLUCOPHAGE) 500 MG tablet Take 1-3 tablets (500-1,500 mg total) by mouth See admin instructions. Take one tablet (500 mg) by mouth daily with breakfast and three tablets (1500 mg) daily with supper     metolazone (ZAROXOLYN) 2.5 MG tablet Take 1 tablet by mouth every Monday and Friday. 12 tablet 3   ONETOUCH ULTRA test strip Use as instructed to check blood sugars twice daily 600 each 3   potassium chloride SA (KLOR-CON) 20 MEQ tablet Take 3 tablets (60 mEq total) by mouth 2 (two) times daily. Take 2 extra tablets on metolazone days     spironolactone (ALDACTONE) 25 MG tablet Take 1 tablet (25 mg total) by mouth daily. 90 tablet 3   torsemide (DEMADEX) 100 MG  tablet Take 1 tablet (100 mg total) by mouth 2 (two) times daily. 60 tablet 3   vitamin E 180 MG (400 UNITS) capsule Take 400 Units by mouth daily.     Zinc 50 MG TABS Take 50 mg by mouth daily.     Zn-Pyg Afri-Nettle-Saw Palmet (SAW PALMETTO COMPLEX PO) Take 540 mg by mouth daily.     calcium carbonate (OS-CAL - DOSED IN MG OF ELEMENTAL CALCIUM) 1250 (500 Ca) MG tablet Take by mouth daily at 6 (six) AM.     losartan (COZAAR) 25 MG tablet Take 1 tablet (25 mg total) by mouth at bedtime. 90 tablet 3   melatonin 3 MG TABS tablet Take by mouth as needed (insomnia).     No current facility-administered medications for this encounter.   Wt Readings from Last 3 Encounters:  04/13/21 98.9 kg (218 lb)  04/13/21 98.9 kg (218 lb)  04/13/21 99.6 kg (219 lb 9.6 oz)   BP 114/78   Pulse 69   Wt 99.6 kg (219 lb 9.6 oz)   SpO2 95%   BMI 33.39 kg/m  General: NAD Neck: No JVD, no thyromegaly or thyroid nodule.  Lungs: Clear to auscultation bilaterally with normal respiratory effort. CV: Nondisplaced PMI.  Heart regular S1/S2, no S3/S4, 2/6 early SEM RUSB with clear S2.  Trace edema.  No carotid bruit.  Normal pedal pulses.  Abdomen: Soft, nontender, no hepatosplenomegaly, no distention.  Skin: Intact without lesions or rashes.  Neurologic: Alert and oriented x 3.  Psych: Normal affect. Extremities: No clubbing or cyanosis.  HEENT: Normal.   Assessment/Plan: 1. Chronic systolic CHF: Long-standing NICM, followed in the past in [place].  Has MDT ICD.  Echo 09/28/20 with EF <20%, severe LV dilation, mild RV dilation with moderate RV dysfunction, mild-moderate MR, moderate-severe TR, severe biatrial enlargement, no more than moderate AS.  Last cath in 2/22 showed nonobstructive mild CAD, elevated right and left heart filling pressures with low CI 2.04;  AS no more than moderate.  Cause of cardiomyopathy uncertain, he is generally in NSR so unlikely to be tachy-mediated CMP.  Possible remote viral  infection/viral myocarditis. NYHA class IV at 4/22 admission.  He was diuresed on milrinone, then milrinone stopped with drop in co-ox to 49%.  Milrinone restarted at 0.25, co-ox back up to 68% and discharged home on milrinone.  Long-term milrinone is not a great option for him given recurrent VT/VF with ICD shocks.  He made the decision in 9/22 that he did not want to get an LVAD and wanted to stop milrinone.  Echo in 9/22 showed EF 25-30% with normal RV and moderate AS. Volume status looks ok on exam and thoracic impedance is trending back up.  NYHA class II-III, symptoms currently manageable and well-controlled.  - Continue torsemide 100 mg bid + metolazone 2.5 Monday and Friday.  BMET today.  - Continue spiro 25 mg daily. - Continue dig 0.125 mg, check dig level today. - Continue Farxiga 10 mg daily. - No beta blocker with low output.  - Increase losartan to 25 mg qhs.  BMET 10 days.  He may be able to tolerate Entresto in the future if creatinine and  - He has an IVCD measuring 154 msec, somewhat LBBB-like.  I will review this with Dr. Quentin Ore, would he gain benefit from CRT?  - Arrange ICM followup with Sharman Cheek.  - If not CRT, would consider barostimulation versus vagal nerve stimulation in the future.   - He has made it clear that he would not want an LVAD and does not want home milrinone.  He is interested in any other HF therapy that he is a candidate for.  2. Atrial fibrillation/flutter: Paroxysmal.  Off Tikosyn after VF on 4/22. He is in NSR today on amiodarone.  - Continue amiodarone 200 mg daily. Will need to follow LFTs and TSH, will need regular eye exam.  - Continue Eliquis, he has not missed any doses. 3. Aortic stenosis: Based on full evaluation (cath and echo), suspect no more than moderate aortic stenosis.  He would not benefit from TAVR at this point. 4. 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 No chest pain. - Continue statin.  5. VT/VF: VF terminated by ICD shock on  4/22.  Milrinone decreased to 0.25.  Tikosyn stopped and amiodarone started. No VT since 5/22.  Risk of recurrent VT/VF will be high as long as he is on milrinone. - He is on amiodarone.  6. Hyponatremia: History of hypervolemic hyponatremia. - Keep fluid restricted.  - BMET today. 7. Hx elevated LFTs:  ?Amiodarone, ?CHF.  Doubt related to statin. Viral hepatitis labs negative.  Abdominal US with possible early cirrhosis, no gallbladder.  - Recent LFTs have normalized.   He plans to see a cardiology NP in [place] later this month. I will see him back in [place] in 4 months.  He can arrange a followup in the interim if needed, otherwise will see his provider in [place].   Loralie Champagne  04/13/2021

## 2021-04-13 NOTE — Progress Notes (Signed)
Cardiology Office Note:    Date:  04/13/2021   ID:  Darald Uzzle Mcmanus, DOB 12/29/1945, MRN [id number]  PCP:  Dorothyann Peng, NP  [facility] Cardiologist:  Werner Lean, MD  [facility] Electrophysiologist:  Lars Mage MD  Referring MD: Dorothyann Peng, NP   CC: Follow up HFrEF  History of Present Illness:    Chad Wilkerson is a 75 y.o. male with a hx of Diabetes with HTN, HFrEF 25-30% with Medtronic ICD, 1st HB, mild to moderate AS, Mild MR, Atrial fibrillation on Dofetilide, eliquis, and coreg;  OSA on BiPAP seen 04/01/20.  In interim from that visit, established care with EP Quentin Ore). Started Toresmide 60 mg BID at last visit.  In interim had planned Low dose dobutamine stress echo for LFLGAS; we were unable to safely staff this procedure- went to [facility].  In interim of this visit, patient had valve that was easily crossed; RA 13; PCWP 18.  Last seen 2/22.  In interim of this visit, patient had EP follow up:  Thought to be safe for Tikoysn continuation and in SR.  Device noted increase in Optivol.  Seen 4/22 with repeat imaging performed:  at time of echo was having significant WOB.  Lead to prolonged hospitalization and eval with AHF, palliative milrinione, and LVAD work up.  Last seen by cardiology 11/05/20.  At that time had device shock for VT.  At last discussion there is a possible plan for LVAD.  Had continued with AHF team and had 8/22 DCCV.  He then has a psych inpatient hold at [facility]; he was maintained on his medications.  Was unable to get back in with AHF team at a convenient time: seen 04/13/21.  Patient notes that he is doing has recovered this prior event but is in good spirits.   No chest pain or pressure .  No SOB/DOE and no PND/Orthopnea.  No weight gain or leg swelling.  No palpitations or syncope.  He has tolerated start of BB and has planned increase in ARB after seeing AHF.  Is going to see EP for device check.  Notes that he would like to get  liposuction.  Ideally this would be covered as well.  Notes he will be seeing Latanya Maudlin NP in [facility] and will still have Dr. Algernon Huxley and AHF monitor for follow up on advanced therapies.  His partner had limited access to his care in [facility].  She asked if we had a copy of her HPOA paperwork.  Has questions about different diuretics.  Wants to make sure the AHF team has access to device interrogations.    Past Medical History:  Diagnosis Date   Abnormal liver function tests    Adjustment disorder    Aortic stenosis    Benign colon polyp    Chronic systolic CHF (congestive heart failure) (HCC)    Diabetes mellitus without complication ([place])    Diverticulitis    Essential hypertension    Heart disease    Hypogonadism male    Hyponatremia    ICD (implantable cardioverter-defibrillator) in place    Mitral regurgitation    NICM (nonischemic cardiomyopathy) (HCC)    PAF (paroxysmal atrial fibrillation) ([place])    Sleep apnea    Tricuspid regurgitation    Urine incontinence    Ventricular tachycardia    Vitamin D deficiency     Past Surgical History:  Procedure Laterality Date   CARDIOVERSION N/A 02/03/2021   Procedure: CARDIOVERSION;  Surgeon: Aundra Dubin,  Elby Showers, MD;  Location: [place];  Service: Cardiovascular;  Laterality: N/A;   CHOLECYSTECTOMY     RIGHT HEART CATH N/A 10/05/2020   Procedure: RIGHT HEART CATH;  Surgeon: Larey Dresser, MD;  Location: [place] CV LAB;  Service: Cardiovascular;  Laterality: N/A;   RIGHT/LEFT HEART CATH AND CORONARY ANGIOGRAPHY N/A 07/19/2020   Procedure: RIGHT/LEFT HEART CATH AND CORONARY ANGIOGRAPHY;  Surgeon: Burnell Blanks, MD;  Location: [place] CV LAB;  Service: Cardiovascular;  Laterality: N/A;   TONSILLECTOMY      Current Medications: Current Meds  Medication Sig   acetaminophen (TYLENOL) 500 MG tablet Take 1,000 mg by mouth every 6 (six) hours as needed for headache or mild pain.   amiodarone (PACERONE) 200 MG  tablet Take 200 mg by mouth daily.   Arginine (L-ARGININE-500) 500 MG CAPS Take 500 mg by mouth daily.   Ascorbic Acid (VITAMIN C) 1000 MG tablet Take 1,000 mg by mouth 2 (two) times daily.   atorvastatin (LIPITOR) 20 MG tablet TAKE 1 TABLET BY MOUTH EVERY DAY   B Complex Vitamins (B COMPLEX PO) Take 1 tablet by mouth daily.   Biotin 10 MG TABS Take 10 mg by mouth daily. 10 mg - 10,000 mcg   Blood Glucose Monitoring Suppl (ONE TOUCH ULTRA 2) w/Device KIT Use to monitor blood sugar   budesonide-formoterol (SYMBICORT) 160-4.5 MCG/ACT inhaler Inhale 2 puffs into the lungs 2 (two) times daily.   calcium carbonate (OS-CAL - DOSED IN MG OF ELEMENTAL CALCIUM) 1250 (500 Ca) MG tablet Take by mouth daily at 6 (six) AM.   cholecalciferol (VITAMIN D3) 25 MCG (1000 UNIT) tablet Take 1,000 Units by mouth 2 (two) times daily.   CHROMIUM PO Take 1 tablet by mouth 2 (two) times daily.   dapagliflozin propanediol (FARXIGA) 10 MG TABS tablet Take 1 tablet (10 mg total) by mouth daily before breakfast.   digoxin (LANOXIN) 0.125 MG tablet TAKE 1 TABLET BY MOUTH EVERY DAY   ELIQUIS 5 MG TABS tablet Take 1 tablet (5 mg total) by mouth 2 (two) times daily.   escitalopram (LEXAPRO) 10 MG tablet TAKE 1 TABLET(10 MG) BY MOUTH DAILY   levOCARNitine (L-CARNITINE) 500 MG TABS Take 500 mg by mouth daily.   losartan (COZAAR) 25 MG tablet Take 1 tablet (25 mg total) by mouth at bedtime.   Magnesium 400 MG TABS Take 400 mg by mouth daily.   melatonin 3 MG TABS tablet Take by mouth as needed (insomnia).   metFORMIN (GLUCOPHAGE) 500 MG tablet Take 1-3 tablets (500-1,500 mg total) by mouth See admin instructions. Take one tablet (500 mg) by mouth daily with breakfast and three tablets (1500 mg) daily with supper   metolazone (ZAROXOLYN) 2.5 MG tablet Take 1 tablet by mouth every Monday and Friday.   ONETOUCH ULTRA test strip Use as instructed to check blood sugars twice daily   potassium chloride SA (KLOR-CON) 20 MEQ tablet Take  3 tablets (60 mEq total) by mouth 2 (two) times daily. Take 2 extra tablets on metolazone days   spironolactone (ALDACTONE) 25 MG tablet Take 1 tablet (25 mg total) by mouth daily.   torsemide (DEMADEX) 100 MG tablet Take 1 tablet (100 mg total) by mouth 2 (two) times daily.   vitamin E 180 MG (400 UNITS) capsule Take 400 Units by mouth daily.   Zinc 50 MG TABS Take 50 mg by mouth daily.   Zn-Pyg Afri-Nettle-Saw Palmet (SAW PALMETTO COMPLEX PO) Take 540 mg by mouth daily.   [  DISCONTINUED] CALCIUM PO Take 1 tablet by mouth 2 (two) times daily.    Allergies:   Semaglutide   Social History   Socioeconomic History   Marital status: Widowed    Spouse name: Not on file   Number of children: Not on file   Years of education: Not on file   Highest education level: Not on file  Occupational History   Not on file  Tobacco Use   Smoking status: Former   Smokeless tobacco: Never  Vaping Use   Vaping Use: Never used  Substance and Sexual Activity   Alcohol use: Not Currently   Drug use: Not Currently   Sexual activity: Not on file  Other Topics Concern   Not on file  Social History Narrative   Not on file   Social Determinants of Health   Financial Resource Strain: Low Risk    Difficulty of Paying Living Expenses: Not very hard  Food Insecurity: No Food Insecurity   Worried About Running Out of Food in the Last Year: Never true   Ran Out of Food in the Last Year: Never true  Transportation Needs: No Transportation Needs   Lack of Transportation (Medical): No   Lack of Transportation (Non-Medical): No  Physical Activity: Inactive   Days of Exercise per Week: 0 days   Minutes of Exercise per Session: 0 min  Stress: No Stress Concern Present   Feeling of Stress : Not at all  Social Connections: Moderately Isolated   Frequency of Communication with Friends and Family: Once a week   Frequency of Social Gatherings with Friends and Family: More than three times a week   Attends  Religious Services: Never   Marine scientist or Organizations: No   Attends Music therapist: Never   Marital Status: Living with partner    Family History: The patient's family history includes Asthma in his father; Heart attack in his mother and paternal grandfather; High Cholesterol in his mother; Kidney disease in his sister; Stroke in his maternal grandfather.  ROS:   Please see the history of present illness.    All other systems reviewed and are negative.  EKGs/Labs/Other Studies Reviewed:    The following studies were reviewed today:  EKG:   SR 68 1st HB ventricular bigeminy, QTc ~ 512 07/12/20: SR rate 83 with 1st HB PR prolongation, IVCD, QRS 130, Frequent PVCs QTc 500 04/02/20: SR with 1st HB PR of 300 anterior infarct pattern and QTc 470 05/10/20 SR 1st Hb 300, occasional PVC, QTc 487 [place]  Arterial ABI and Duplex: Date: 07/26/20 Results: Summary:  Right: Resting right ankle-brachial index is within normal range.  No evidence of significant right lower extremity arterial disease. The  right toe-brachial index is normal.   Left: Resting left ankle-brachial index is within normal range.  No evidence of significant left lower extremity arterial disease. The left  toe-brachial index is normal.   Transthoracic Echocardiogram: Date: 04/23/20 Results:  1. Left ventricular ejection fraction, by estimation, is 20 to 25%. The  left ventricle has severely decreased function. The left ventricle  demonstrates global hypokinesis. The left ventricular internal cavity size  was severely dilated. Indeterminate  diastolic filling due to E-A fusion.   2. Right ventricular systolic function is moderately reduced. The right  ventricular size is normal. Mildly increased right ventricular wall  thickness. There is moderately elevated pulmonary artery systolic  pressure.   3. AV is thickened, calcified with restricted motion. By 2D imaging  appears swverely  restricted. Peak and mean gradients through the valve are  24 and 15 mm Hg respectively. Gradients probably relfect depressed LVEF.  Dimensionless index is 0.36  consistent with moderate AS. Consider TEE to furhter define .   4. Left atrial size was moderately dilated.   5. Mild mitral valve regurgitation. Moderate mitral annular  calcification.   6. There is mild dilatation of the ascending aorta, measuring 42 mm.   7. The inferior vena cava is dilated in size with <50% respiratory  variability, suggesting right atrial pressure of 15 mmHg.   Left/Right Heart Catheterizations: Date: 07/19/20 Results: Prox RCA to Mid RCA lesion is 20% stenosed. 1st Mrg lesion is 40% stenosed. Mid LAD lesion is 20% stenosed.   1. Mild non-obstructive CAD 2. Elevated filling pressures: RA:13, RV 64/8/14 PA 64/26 (mean 40), PCWP: 28, LV: 101/15/25  AO: 90/60 CO: 4.43 L/min  CI: 2.04  3. Moderate aortic stenosis. He has low gradients across the valve by echo and by cath. Cath: mean gradient 10.5 mmHg, peak to peak gradient 12 mmhg, AVA 1.83 cm2).  The valve was easily crossed with the J wire. Cannot exclude low flow/low gradient AS but the dimensionless index and AVA findings on echo would also argue against this being severe.    Recommendations: He is volume overloaded and dyspneic even with speaking. Will admit for diuresis with IV Lasix. He has severe LV systolic dysfunction with low gradients across the aortic valve. The valve was easily crossed with a J wire. I suspect that he has moderate aortic stenosis. (Echo with AVA over 1.0 cm2, dimensionless index 0.36). Will review his findings with our structural heart team.   Date: 10/05/20 Good cardiac output and PAPI on milrinone. 2. Elevated L>R heart filling pressures. 3. Moderate pulmonary venous hypertension.   Will continue milrinone 0.25 for now and restart Lasix gtt, will need further diuresis.  Continue LVAD discussions.  Recent Labs: 09/28/2020: B  Natriuretic Peptide 3,328.4 01/26/2021: Magnesium 2.4 01/31/2021: ALT 27; TSH 2.573 04/13/2021: BUN 46; Creatinine, Ser 1.15; Hemoglobin 14.3; Platelets 372; Potassium 3.1; Sodium 126    Physical Exam:    VS:  BP 98/60   Pulse (!) 55   Ht 5' 8"  (1.727 m)   Wt 218 lb (98.9 kg)   SpO2 96%   BMI 33.15 kg/m     Wt Readings from Last 3 Encounters:  04/13/21 218 lb (98.9 kg)  04/13/21 218 lb (98.9 kg)  04/13/21 219 lb 9.6 oz (99.6 kg)    GEN: Obese well developed in no acute distress HEENT: Normal NECK: No JVD  LYMPHATICS: No lymphadenopathy CARDIAC: RRR, III/VI systolic flow murmur, no rubs, gallops, no RV Heave RESPIRATORY:  Clear to auscultation without rales, wheezing or rhonchi  ABDOMEN: Soft, non-tender distended  MUSCULOSKELETAL:  trace pitting edema, No deformity  SKIN: Warm and dry NEUROLOGIC:  Alert and oriented x 3 PSYCHIATRIC:  Normal affect   ASSESSMENT:    1. NICM (nonischemic cardiomyopathy) ([place])   2. Moderate aortic stenosis   3. Paroxysmal atrial fibrillation (HCC)   4. ICD (implantable cardioverter-defibrillator) in place   5. Morbid obesity ([place])   6. Obstructive sleep apnea syndrome     PLAN:    In order of problems listed above:  Heart Failure Reduced EF  20-25% Stage D HF on milrinone Moderate AS Atrial Flutter off Tikosyn post VT Mild MR Diabetes with hypertension Morbid Obesity OSA on CPAP - NYHA class II, Stage D, slightly volemic, etiology  likely multi-factorial - Continue torsemide 100 mg bid + metolazone 2.5 every Monday and Friday.  - Continue spiro 25 mg daily. - Continue dig 0.125 mg - Continue Farxiga 10 mg daily. - No beta blocker with low output.  - AHF increased ARB - s/p recent DCCV - Continue statin.  - on maintenance amiodarone.  - continue milrinone dose  ADDENDUM: Unable to find a provider that would provide Liposuction on milrinone  Time Spent Directly with Patient:   I have spent a total of 67 minutes with the  patient reviewing notes, imaging, EKGs, labs and examining the patient as well as establishing an assessment and plan that was discussed personally with the patient.  > 50% of time was spent in direct patient care and family.  ADDENDUM: Unable to find HPOA in our system.  Will plan for PRN follow up unless new symptoms or abnormal test results warranting change in plan  (has [employer], AHF, and EP f/u)     Medication Adjustments/Labs and Tests Ordered: Current medicines are reviewed at length with the patient today.  Concerns regarding medicines are outlined above.  No orders of the defined types were placed in this encounter.  No orders of the defined types were placed in this encounter.   Patient Instructions  Medication Instructions:  Your physician recommends that you continue on your current medications as directed. Please refer to the Current Medication list given to you today.  *If you need a refill on your cardiac medications before your next appointment, please call your pharmacy*   Lab Work: None Ordered If you have labs (blood work) drawn today and your tests are completely normal, you will receive your results only by: [place] (if you have MyChart) OR A paper copy in the mail If you have any lab test that is abnormal or we need to change your treatment, we will call you to review the results.   Testing/Procedures: None Ordered   Follow-Up: At [facility], you and your health needs are our priority.  As part of our continuing mission to provide you with exceptional heart care, we have created designated Provider Care Teams.  These Care Teams include your primary Cardiologist (physician) and Advanced Practice Providers (APPs -  Physician Assistants and Nurse Practitioners) who all work together to provide you with the care you need, when you need it.   Your next appointment:     As Needed  The format for your next appointment:   In Person or  Virtual  Provider:   You may see Werner Lean, MD or one of the following Advanced Practice Providers on your designated Care Team:   Melina Copa, PA-C Ermalinda Barrios, PA-C       Signed, Werner Lean, MD  04/13/2021 11:55 AM    [place]

## 2021-04-13 NOTE — Patient Instructions (Signed)
Medication Instructions:   Your physician recommends that you continue on your current medications as directed. Please refer to the Current Medication list given to you today.   *If you need a refill on your cardiac medications before your next appointment, please call your pharmacy*   Lab Work: NONE ORDERED  TODAY    If you have labs (blood work) drawn today and your tests are completely normal, you will receive your results only by: MyChart Message (if you have MyChart) OR A paper copy in the mail If you have any lab test that is abnormal or we need to change your treatment, we will call you to review the results.   Testing/Procedures:     Follow-Up: At [facility], you and your health needs are our priority.  As part of our continuing mission to provide you with exceptional heart care, we have created designated Provider Care Teams.  These Care Teams include your primary Cardiologist (physician) and Advanced Practice Providers (APPs -  Physician Assistants and Nurse Practitioners) who all work together to provide you with the care you need, when you need it.  We recommend signing up for the patient portal called "MyChart".  Sign up information is provided on this After Visit Summary.  MyChart is used to connect with patients for Virtual Visits (Telemedicine).  Patients are able to view lab/test results, encounter notes, upcoming appointments, etc.  Non-urgent messages can be sent to your provider as well.   To learn more about what you can do with MyChart, go to [email or web address].    Your next appointment:   1 year(s)  The format for your next appointment:   In Person  Provider:   You may see Lanier Prude, MD   Other Instructions

## 2021-04-13 NOTE — Patient Instructions (Signed)
Medication Instructions:  Your physician recommends that you continue on your current medications as directed. Please refer to the Current Medication list given to you today.  *If you need a refill on your cardiac medications before your next appointment, please call your pharmacy*   Lab Work: None Ordered If you have labs (blood work) drawn today and your tests are completely normal, you will receive your results only by: MyChart Message (if you have MyChart) OR A paper copy in the mail If you have any lab test that is abnormal or we need to change your treatment, we will call you to review the results.   Testing/Procedures: None Ordered   Follow-Up: At [facility], you and your health needs are our priority.  As part of our continuing mission to provide you with exceptional heart care, we have created designated Provider Care Teams.  These Care Teams include your primary Cardiologist (physician) and Advanced Practice Providers (APPs -  Physician Assistants and Nurse Practitioners) who all work together to provide you with the care you need, when you need it.   Your next appointment:     As Needed  The format for your next appointment:   In Person or Virtual  Provider:   You may see Mahesh A Chandrasekhar, MD or one of the following Advanced Practice Providers on your designated Care Team:   Dayna Dunn, PA-C Michele Lenze, PA-C    

## 2021-04-13 NOTE — Patient Instructions (Addendum)
Increase Losartan to 25 mg (1 tab) Daily at bedtime  Labs done today, your results will be available in MyChart, we will contact you for abnormal readings.  Your physician recommends that you return for lab work in: 1-2 weeks, we have provided you with a prescription to have this done locally  You have been referred to the [facility], they will call you  You must contact a lawyer to complete the steps for Healthcare Power of Attorney  Your physician recommends that you schedule a follow-up appointment in: 4 months  If you have any questions or concerns before your next appointment please send [place] a message through [place] or call our office at [phone].    TO LEAVE A MESSAGE FOR THE NURSE SELECT OPTION 2, PLEASE LEAVE A MESSAGE INCLUDING: YOUR NAME DATE OF BIRTH CALL BACK NUMBER REASON FOR CALL**this is important as we prioritize the call backs  YOU WILL RECEIVE A CALL BACK THE SAME DAY AS LONG AS YOU CALL BEFORE 4:00 PM  At [facility], you and your health needs are our priority. As part of our continuing mission to provide you with exceptional heart care, we have created designated Provider Care Teams. These Care Teams include your primary Cardiologist (physician) and Advanced Practice Providers (APPs- Physician Assistants and Nurse Practitioners) who all work together to provide you with the care you need, when you need it.   You may see any of the following providers on your designated Care Team at your next follow up: Dr Arvilla Meres Dr Carron Curie, NP Robbie Lis, [facility] [place], [place] Karle Plumber, PharmD   Please be sure to bring in all your medications bottles to every appointment.

## 2021-04-18 ENCOUNTER — Encounter (HOSPITAL_COMMUNITY): Payer: Self-pay

## 2021-04-22 ENCOUNTER — Telehealth: Payer: Self-pay

## 2021-04-22 NOTE — Telephone Encounter (Signed)
-----   Message from Noralee Space, RN sent at 04/13/2021  5:01 PM EDT ----- Dr Shirlee Latch would like you to start following this guy too, he can hard to reach on the phone but responds to [employer], thanks

## 2021-04-22 NOTE — Telephone Encounter (Signed)
Pt referred to ICM by Dr Shirlee Latch.  Attempted ICM intro call and left message for return call with phone number.

## 2021-04-25 ENCOUNTER — Ambulatory Visit (INDEPENDENT_AMBULATORY_CARE_PROVIDER_SITE_OTHER): Payer: Medicare Other

## 2021-04-25 DIAGNOSIS — I480 Paroxysmal atrial fibrillation: Secondary | ICD-10-CM | POA: Diagnosis not present

## 2021-04-25 LAB — CUP PACEART REMOTE DEVICE CHECK
Battery Remaining Longevity: 81 mo
Battery Voltage: 2.99 V
Brady Statistic RV Percent Paced: 2.99 %
Date Time Interrogation Session: 20221112044223
HighPow Impedance: 80 Ohm
Implantable Lead Implant Date: 20180625
Implantable Lead Location: 753860
Implantable Pulse Generator Implant Date: 20180625
Lead Channel Impedance Value: 304 Ohm
Lead Channel Impedance Value: 418 Ohm
Lead Channel Pacing Threshold Amplitude: 0.5 V
Lead Channel Pacing Threshold Pulse Width: 0.4 ms
Lead Channel Sensing Intrinsic Amplitude: 15.5 mV
Lead Channel Sensing Intrinsic Amplitude: 15.5 mV
Lead Channel Setting Pacing Amplitude: 2 V
Lead Channel Setting Pacing Pulse Width: 0.4 ms
Lead Channel Setting Sensing Sensitivity: 0.3 mV

## 2021-04-26 ENCOUNTER — Ambulatory Visit: Payer: Medicare Other

## 2021-04-29 ENCOUNTER — Encounter: Payer: Self-pay | Admitting: Adult Health

## 2021-04-29 NOTE — Telephone Encounter (Signed)
The patient moved to the [place] area and is now being followed by [employer]. Pt been released in [facility] and upcoming appointments have been canceled. I also marked the patient inactive in [facility].

## 2021-05-02 MED ORDER — ESCITALOPRAM OXALATE 10 MG PO TABS: ORAL_TABLET | ORAL | 0 refills | Status: AC

## 2021-05-02 NOTE — Progress Notes (Signed)
[facility] Summary Report / Loop Recorder 

## 2021-05-03 NOTE — Progress Notes (Signed)
Patient transferred care to [facility] per [facility] note 04/29/21.

## 2021-05-04 ENCOUNTER — Encounter (HOSPITAL_COMMUNITY): Payer: Self-pay | Admitting: *Deleted

## 2021-05-08 ENCOUNTER — Other Ambulatory Visit: Payer: Self-pay | Admitting: Internal Medicine

## 2021-05-08 DIAGNOSIS — I350 Nonrheumatic aortic (valve) stenosis: Secondary | ICD-10-CM

## 2021-05-27 ENCOUNTER — Other Ambulatory Visit (HOSPITAL_COMMUNITY): Payer: Self-pay | Admitting: Cardiology

## 2021-06-01 ENCOUNTER — Ambulatory Visit: Payer: Medicare Other

## 2021-06-03 ENCOUNTER — Other Ambulatory Visit (HOSPITAL_COMMUNITY): Payer: Self-pay | Admitting: Cardiology

## 2021-06-12 DEATH — deceased

## 2021-07-14 ENCOUNTER — Other Ambulatory Visit (HOSPITAL_COMMUNITY): Payer: Self-pay | Admitting: Family Medicine

## 2021-08-11 ENCOUNTER — Encounter (HOSPITAL_COMMUNITY): Payer: Medicare Other | Admitting: Cardiology

## 2022-07-15 IMAGING — DX DG CHEST 1V PORT
1 series · 1 of 1 positions shown · non-contrast
Comparison: None.

CLINICAL DATA: Worsening shortness of breath.

EXAM:
PORTABLE CHEST 1 VIEW

[chest ap]
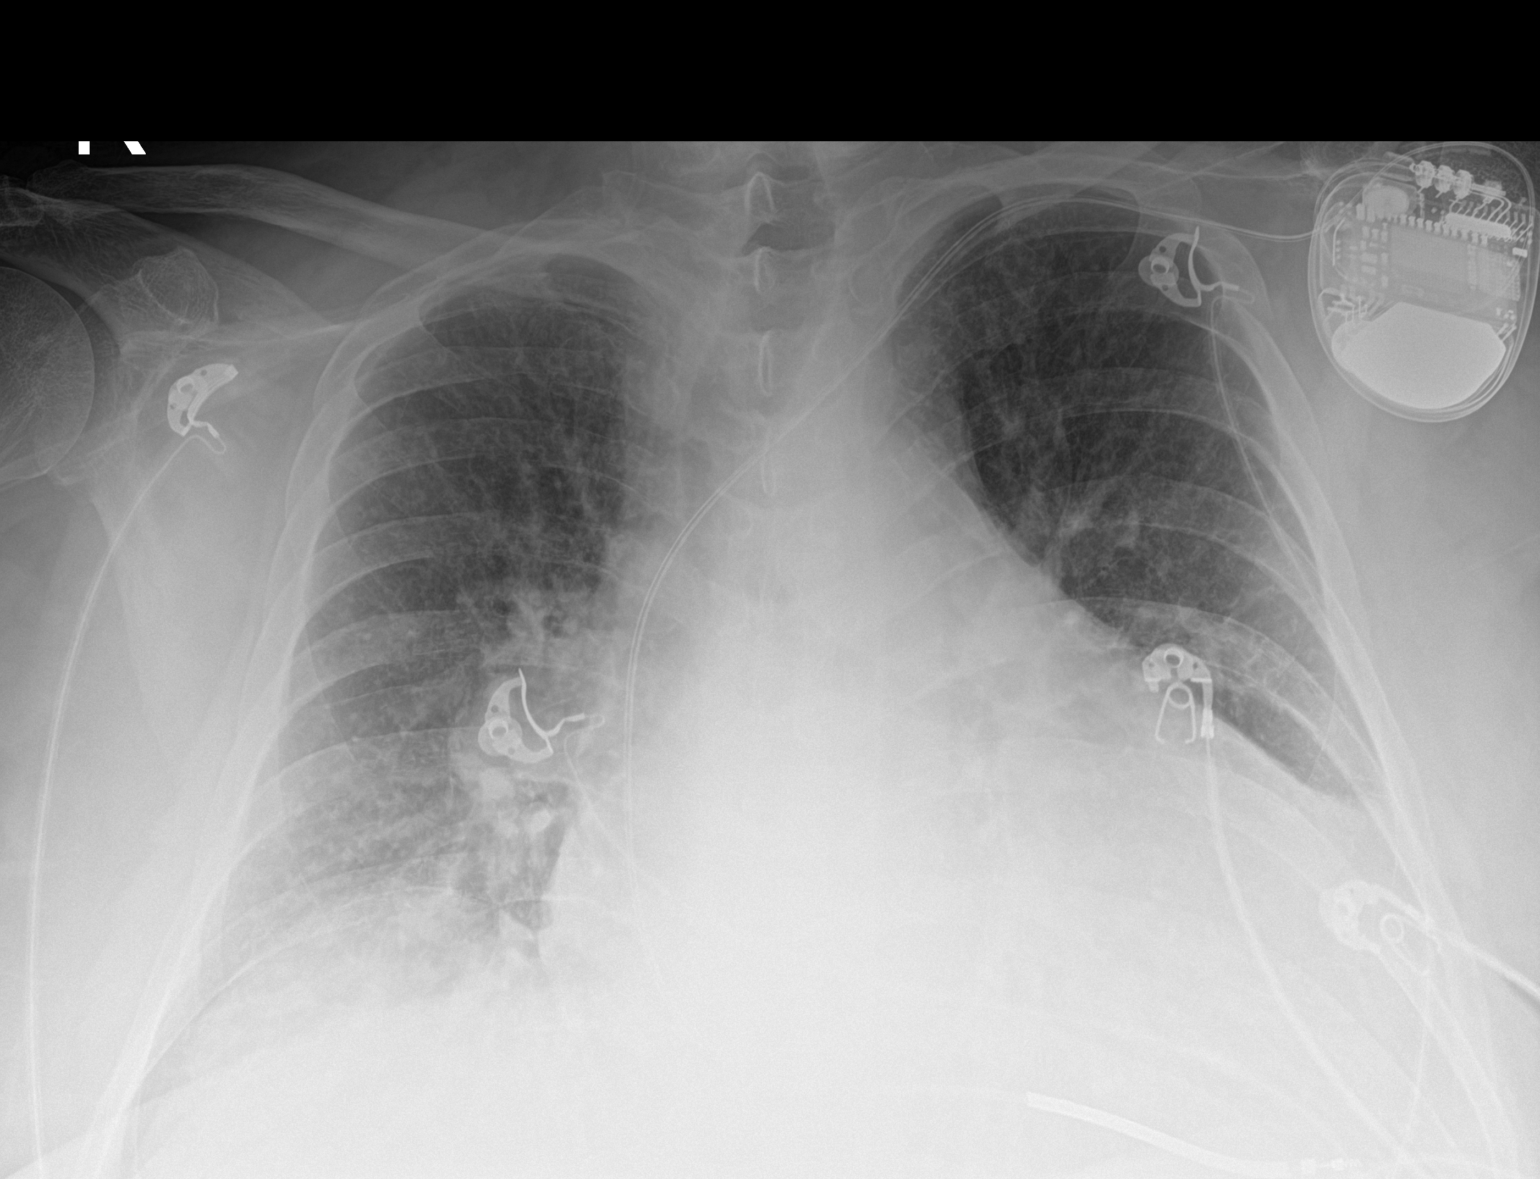

[1 of 1 positions shown; findings below may reference images not displayed]

FINDINGS: Left chest wall pacemaker with single lead terminating in the right
ventricle. Moderate cardiomegaly. Pulmonary vascular congestion with
mild diffuse interstitial thickening. No focal consolidation,
pleural effusion, or pneumothorax. No acute osseous abnormality.
IMPRESSION: 1. Cardiomegaly with mild interstitial pulmonary edema.

## 2022-07-28 IMAGING — CT CT HEAD W/O CM
4 series · 17 of 47 positions shown, 19 images · non-contrast
Comparison: None.

CLINICAL DATA: Status post fall.

EXAM:
CT HEAD WITHOUT CONTRAST
TECHNIQUE: Contiguous axial images were obtained from the base of the skull
through the vertex without intravenous contrast.

[Series 3: head wo · axial · 0.48mm/px · z∈[-96,+24]mm · 7 of 34 slices shown, 9 images]
[im 5/34  brain]
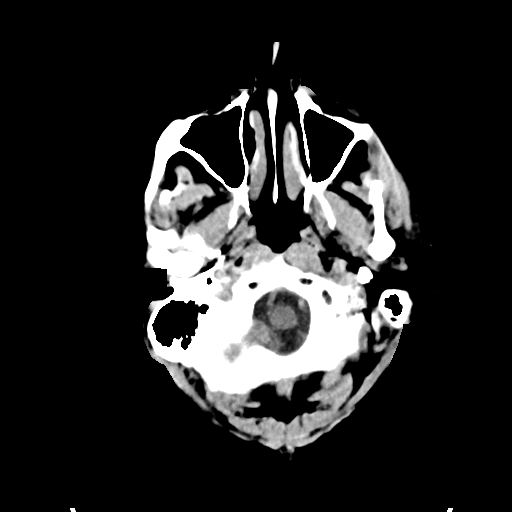
[im 5/34  bone]
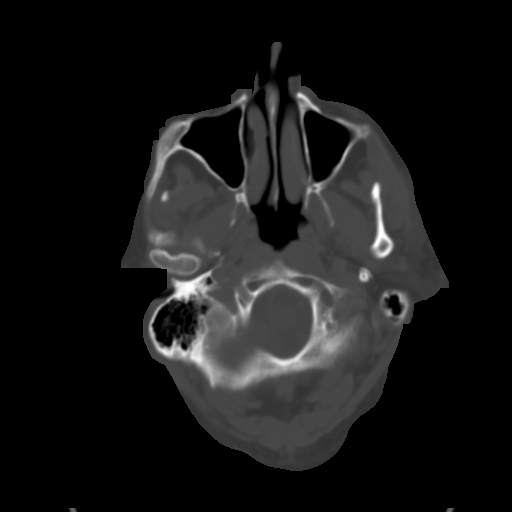
[im 9/34  brain]
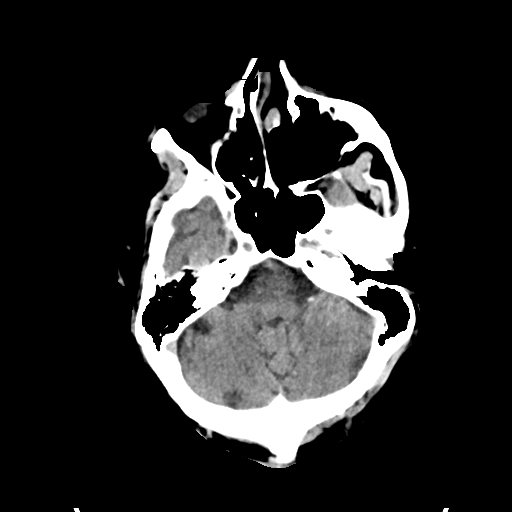
[im 13/34  brain]
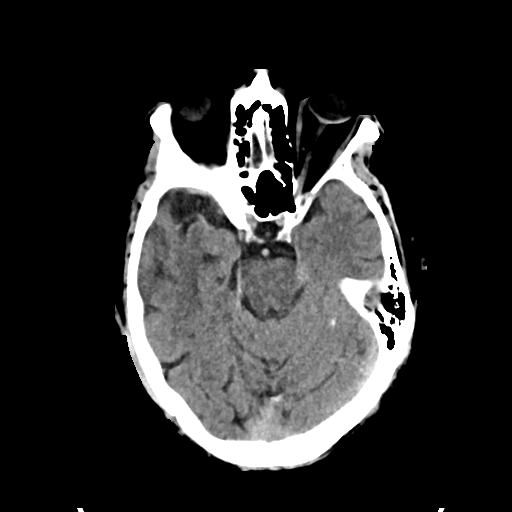
[im 17/34  brain]
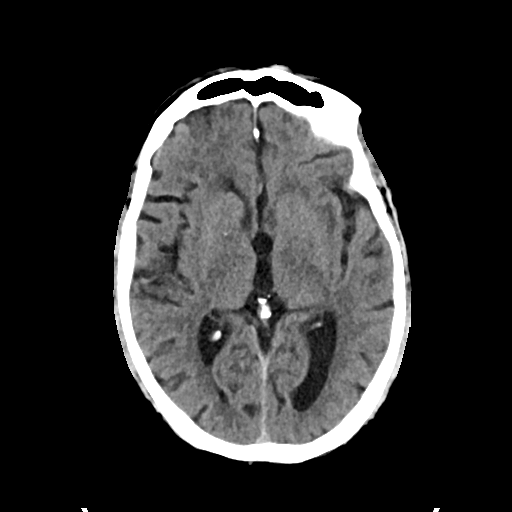
[im 21/34  brain]
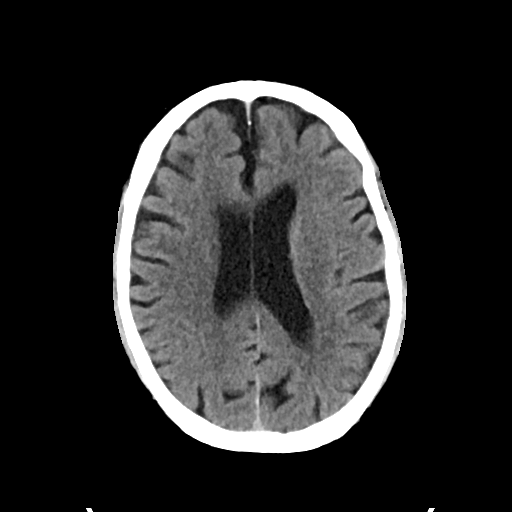
[im 21/34  bone]
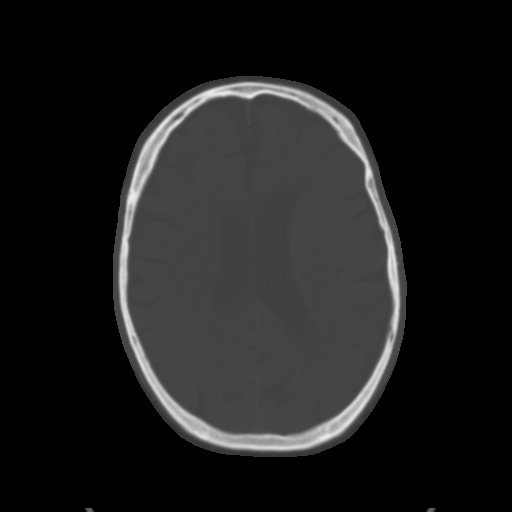
[im 25/34  brain]
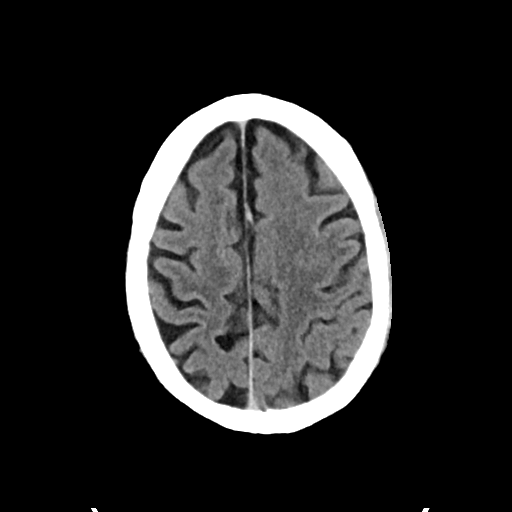
[im 29/34  brain]
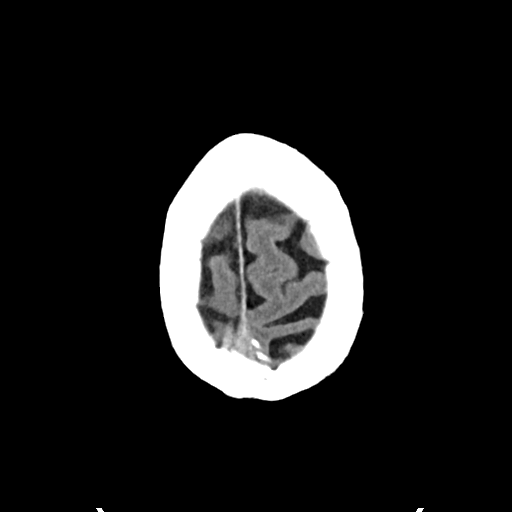

[Series 4: head bone · axial · 0.48mm/px · z∈[-100,-42]mm · 4 of 85 slices shown]
[im 9/85  bone]
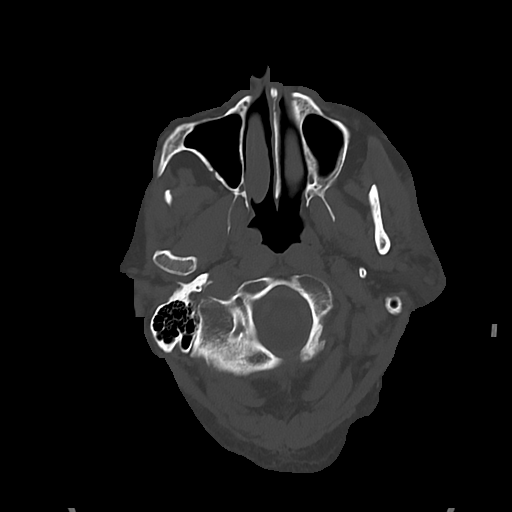
[im 17/85  bone]
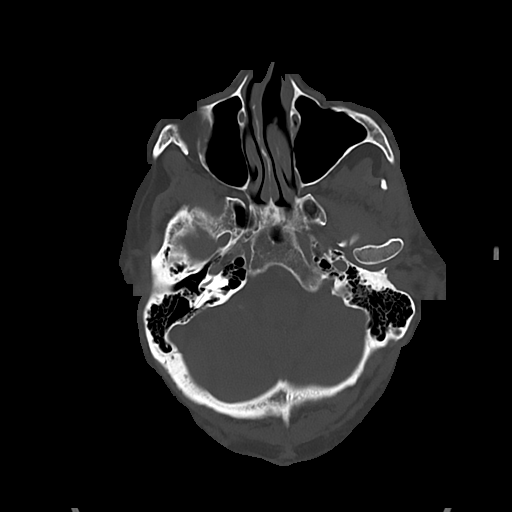
[im 26/85  bone]
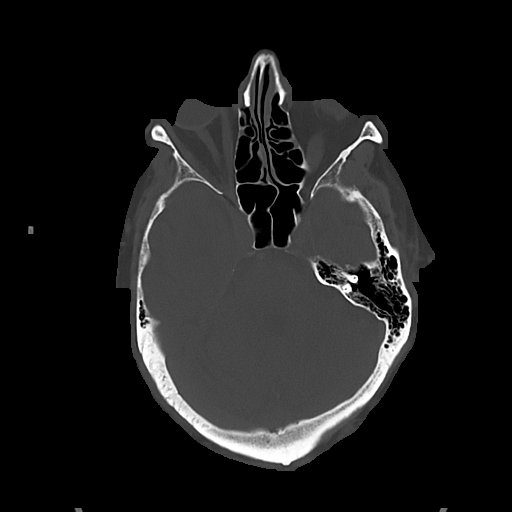
[im 38/85  bone]
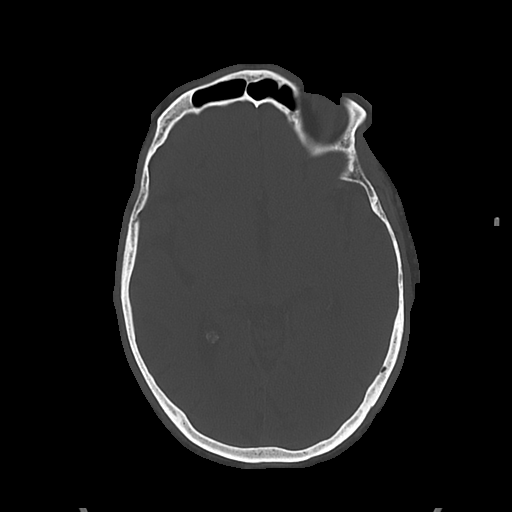

[Series 5: cor soft · coronal · 0.37mm/px · 3 of 76 slices shown]
[im 26/76  brain]
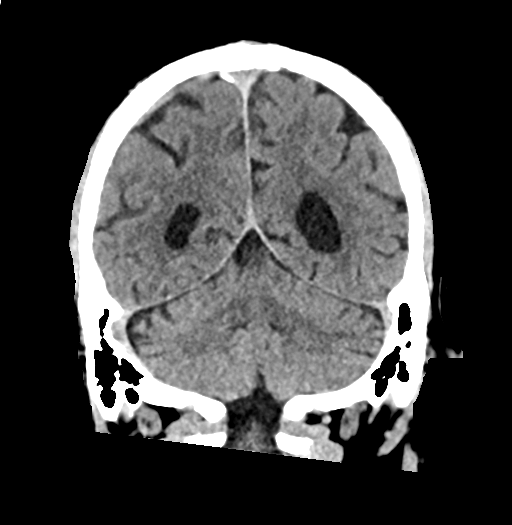
[im 34/76  brain]
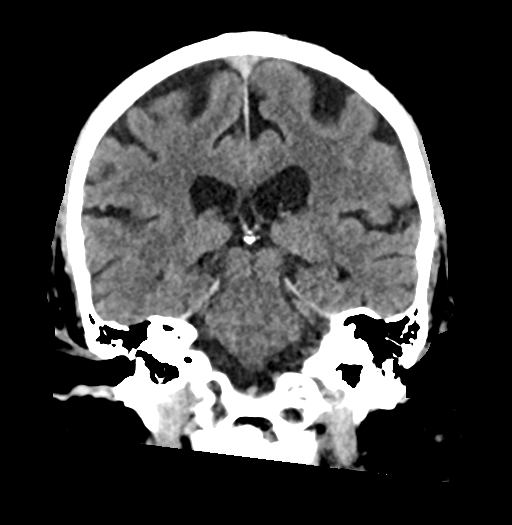
[im 42/76  brain]
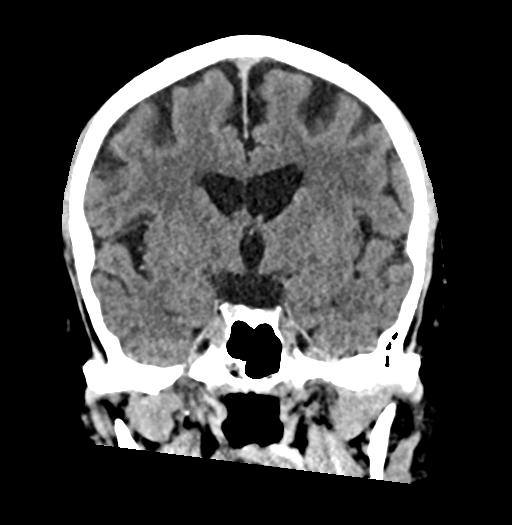

[Series 6: sag soft · sagittal · 0.35mm/px · 3 of 63 slices shown]
[im 22/63  brain]
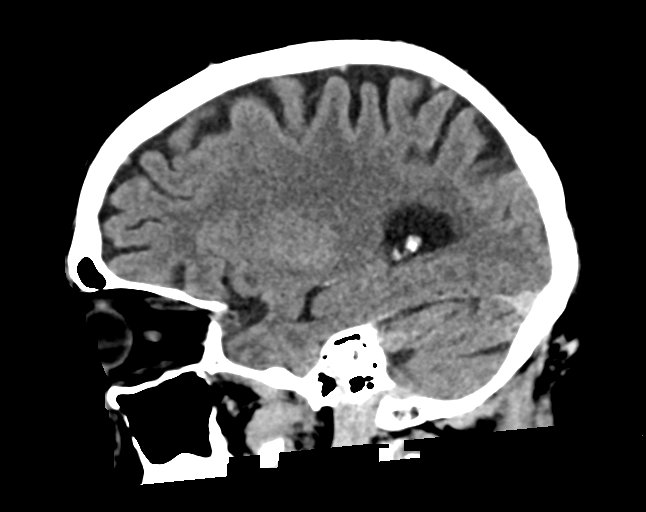
[im 31/63  brain]
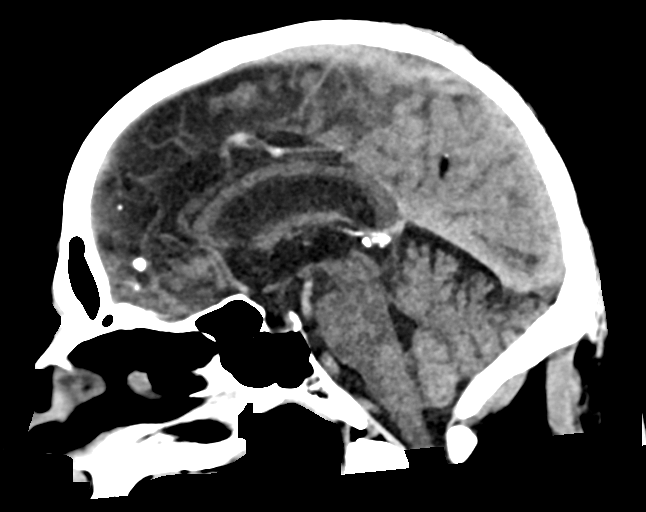
[im 40/63  brain]
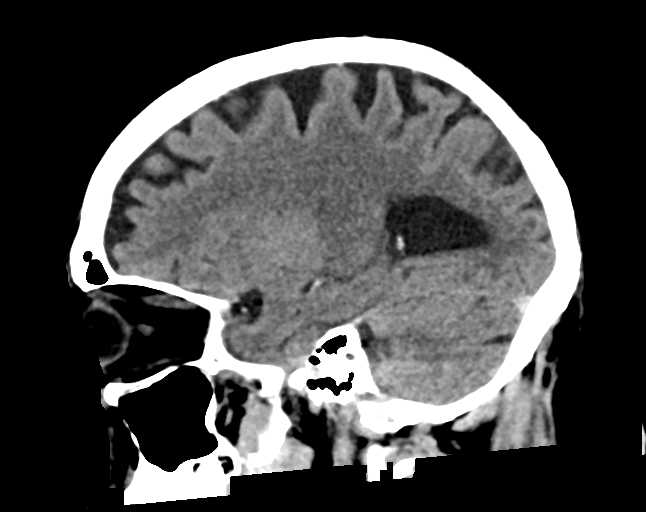

[17 of 47 positions shown; findings below may reference images not displayed]

FINDINGS: Brain: No evidence of acute infarction, hemorrhage, hydrocephalus,
extra-axial collection or mass lesion/mass effect.

Vascular: No hyperdense vessel or unexpected calcification.

Skull: Intact.  No focal lesion.

Sinuses/Orbits: Negative.

Other: None.
IMPRESSION: No acute abnormality.

Atrophy and chronic microvascular ischemic change.

## 2022-08-20 IMAGING — XA IR REPLACE PICC
1 series · 1 of 1 positions shown · IV contrast (agent unspecified)
Comparison: none

INDICATION: Malfunctioning PICC line.  Request image guided exchange.

EXAM:
FLUOROSCOPIC GUIDED RIGHT UPPER EXTREMITY PICC LINE EXCHANGE
MEDICATIONS:
None.
CONTRAST:  None
FLUOROSCOPY TIME:  0 minutes 12 seconds
COMPLICATIONS:
None immediate.
TECHNIQUE: The procedure, risks, benefits, and alternatives were explained to
the patient and informed written consent was obtained. The right
upper extremity and external portion of the existing PICC line was
prepped with chlorhexidine in a sterile fashion, and a sterile drape
was applied covering the operative field. Maximum barrier sterile
technique with sterile gowns and gloves were used for the procedure.
A timeout was performed prior to the initiation of the procedure.
Local anesthesia was provided with 1% lidocaine.

[Series 1: fl (-) angio · 1 of 1 slices shown]
[im 1/1]
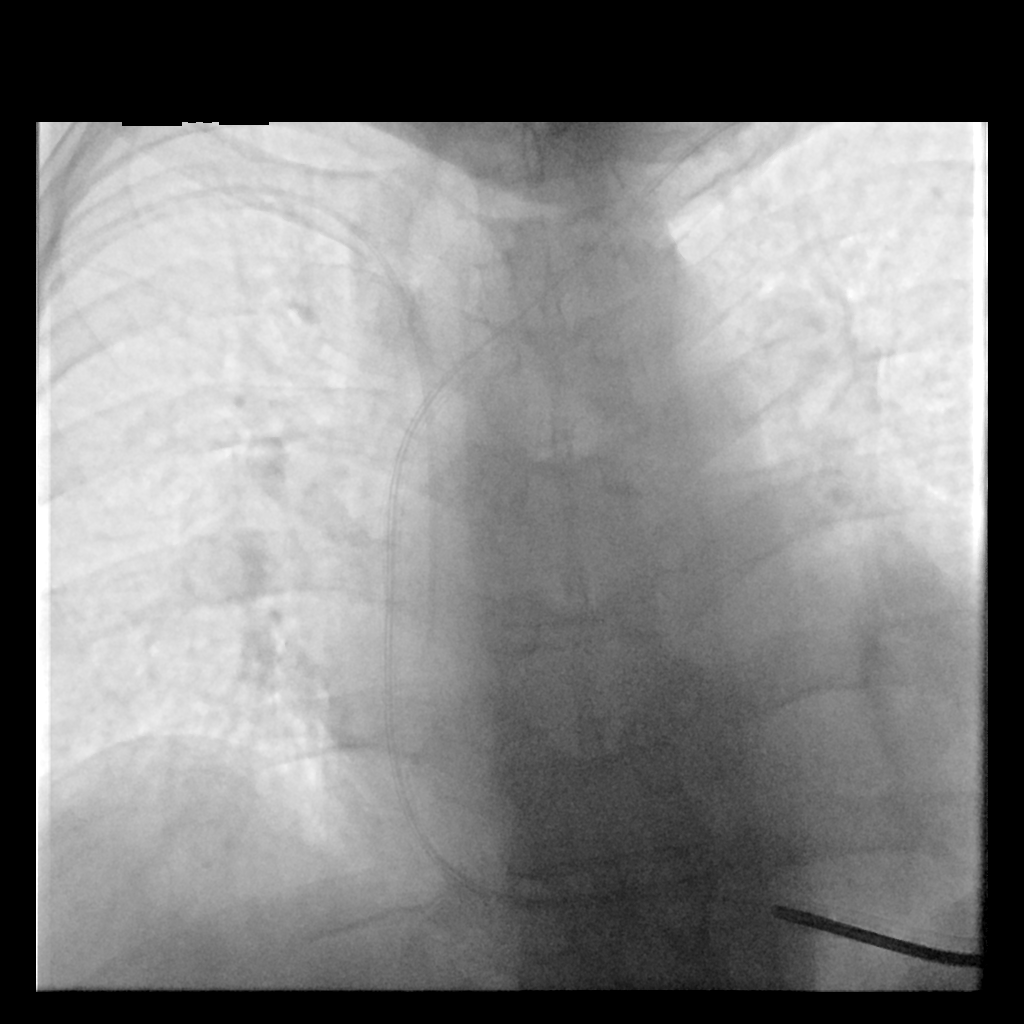

[1 of 1 positions shown; findings below may reference images not displayed]

The existing PICC line was cannulated with an 0.0018 wire which was
advanced through the catheter. The catheter was exchanged for a
peel-away sheath, ultimately allowing advancement of a 43-cm, 5 -
French, dual lumen PICC line to the level of the superior caval
atrial junction. A post procedure spot fluoroscopic image was
obtained. The catheter easily aspirated and flushed and was secured
in place. A dressing was placed. The patient tolerated the procedure
well without immediate post procedural complication.
FINDINGS: After catheter exchange, the tip lies within the superior cavoatrial
junction. The catheter aspirates and flushes normally and is ready
for immediate use.
IMPRESSION: Successful fluoroscopic guided exchange of right upper extremity
approach 43 cm, 5 - French, dual lumen PICC with tip overlying the
superior caval atrial junction. The PICC line is ready for immediate
use.

## 2022-10-30 IMAGING — DX DG CHEST 1V PORT
1 series · 1 of 1 positions shown · non-contrast
Comparison: 09/28/2020

CLINICAL DATA: Shortness of breath.  Leukocytosis.

EXAM:
PORTABLE CHEST 1 VIEW

[chest ap]
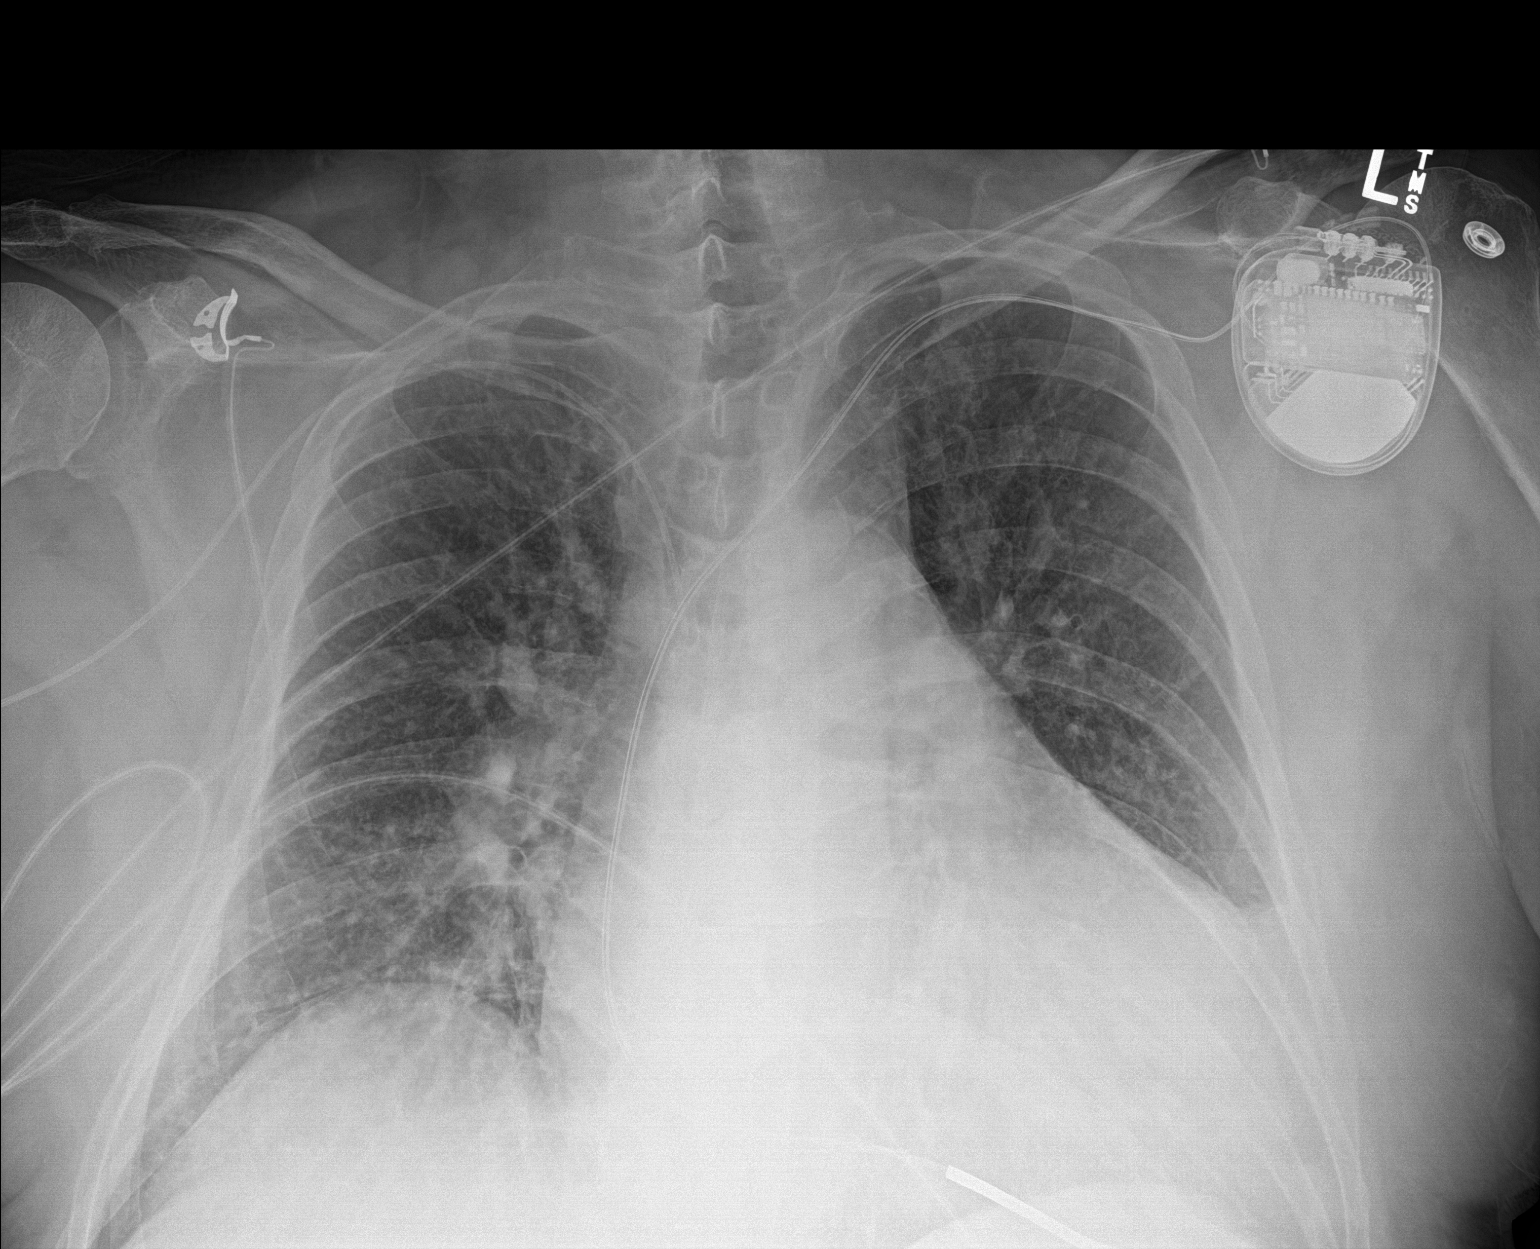

[1 of 1 positions shown; findings below may reference images not displayed]

FINDINGS: Chronically enlarged cardiac silhouette. Chronic aortic
atherosclerosis. Pacemaker/AICD remains in place. Right arm PICC tip
in the SVC at the azygos level. Allowing for the portable technique,
the lungs are clear. No consolidation or collapse. There could
possibly be pulmonary venous hypertension.
IMPRESSION: No significant change. Cardiomegaly. Pacemaker/AICD. Right arm PICC
tip at the as a gas level of the SVC. No sign of pneumonia or
collapse.
